# Patient Record
Sex: Male | Born: 1935 | Race: White | Hispanic: No | Smoking: Never smoker
Health system: Southern US, Community
[De-identification: ages and names within clinical notes are randomized; demographics above are authoritative.]

## PROBLEM LIST (undated history)

## (undated) DIAGNOSIS — D500 Iron deficiency anemia secondary to blood loss (chronic): Secondary | ICD-10-CM

## (undated) DIAGNOSIS — E785 Hyperlipidemia, unspecified: Secondary | ICD-10-CM

## (undated) DIAGNOSIS — W19XXXA Unspecified fall, initial encounter: Secondary | ICD-10-CM

## (undated) DIAGNOSIS — I714 Abdominal aortic aneurysm, without rupture, unspecified: Secondary | ICD-10-CM

## (undated) DIAGNOSIS — K219 Gastro-esophageal reflux disease without esophagitis: Secondary | ICD-10-CM

## (undated) DIAGNOSIS — K2960 Other gastritis without bleeding: Secondary | ICD-10-CM

## (undated) DIAGNOSIS — I2510 Atherosclerotic heart disease of native coronary artery without angina pectoris: Secondary | ICD-10-CM

## (undated) DIAGNOSIS — G4700 Insomnia, unspecified: Secondary | ICD-10-CM

## (undated) DIAGNOSIS — K5790 Diverticulosis of intestine, part unspecified, without perforation or abscess without bleeding: Secondary | ICD-10-CM

## (undated) DIAGNOSIS — E119 Type 2 diabetes mellitus without complications: Secondary | ICD-10-CM

## (undated) DIAGNOSIS — I4891 Unspecified atrial fibrillation: Secondary | ICD-10-CM

## (undated) DIAGNOSIS — N400 Enlarged prostate without lower urinary tract symptoms: Secondary | ICD-10-CM

## (undated) DIAGNOSIS — M8580 Other specified disorders of bone density and structure, unspecified site: Secondary | ICD-10-CM

## (undated) DIAGNOSIS — E538 Deficiency of other specified B group vitamins: Secondary | ICD-10-CM

## (undated) DIAGNOSIS — I10 Essential (primary) hypertension: Secondary | ICD-10-CM

## (undated) DIAGNOSIS — E039 Hypothyroidism, unspecified: Secondary | ICD-10-CM

## (undated) HISTORY — DX: Hyperlipidemia, unspecified: E78.5

## (undated) HISTORY — DX: Insomnia, unspecified: G47.00

## (undated) HISTORY — DX: Other specified disorders of bone density and structure, unspecified site: M85.80

## (undated) HISTORY — DX: Benign prostatic hyperplasia without lower urinary tract symptoms: N40.0

## (undated) HISTORY — DX: Other gastritis without bleeding: K29.60

## (undated) HISTORY — PX: CATARACT EXTRACTION: SUR2

## (undated) HISTORY — DX: Abdominal aortic aneurysm, without rupture: I71.4

## (undated) HISTORY — PX: CORONARY ARTERY BYPASS GRAFT: SHX141

## (undated) HISTORY — PX: PROSTATECTOMY: SHX69

## (undated) HISTORY — PX: COLONOSCOPY: SHX174

## (undated) HISTORY — DX: Type 2 diabetes mellitus without complications: E11.9

## (undated) HISTORY — DX: Diverticulosis of intestine, part unspecified, without perforation or abscess without bleeding: K57.90

## (undated) HISTORY — PX: ESOPHAGOGASTRODUODENOSCOPY: SHX1529

## (undated) HISTORY — DX: Deficiency of other specified B group vitamins: E53.8

## (undated) HISTORY — DX: Abdominal aortic aneurysm, without rupture, unspecified: I71.40

## (undated) HISTORY — DX: Iron deficiency anemia secondary to blood loss (chronic): D50.0

## (undated) HISTORY — DX: Gastro-esophageal reflux disease without esophagitis: K21.9

## (undated) HISTORY — PX: CARDIAC CATHETERIZATION: SHX172

## (undated) HISTORY — PX: CHOLECYSTECTOMY, LAPAROSCOPIC: SHX56

## (undated) HISTORY — DX: Essential (primary) hypertension: I10

## (undated) HISTORY — DX: Unspecified atrial fibrillation: I48.91

## (undated) HISTORY — DX: Hypothyroidism, unspecified: E03.9

## (undated) HISTORY — PX: OTHER SURGICAL HISTORY: SHX169

## (undated) HISTORY — DX: Atherosclerotic heart disease of native coronary artery without angina pectoris: I25.10

## (undated) SURGERY — Surgical Case
Anesthesia: *Unknown

---

## 1990-05-13 HISTORY — PX: PENILE PROSTHESIS IMPLANT: SHX240

## 1998-09-26 ENCOUNTER — Ambulatory Visit (HOSPITAL_COMMUNITY): Admission: RE | Admit: 1998-09-26 | Discharge: 1998-09-26 | Payer: Self-pay | Admitting: *Deleted

## 1998-09-26 ENCOUNTER — Encounter: Payer: Self-pay | Admitting: *Deleted

## 1998-11-23 ENCOUNTER — Observation Stay (HOSPITAL_COMMUNITY): Admission: AD | Admit: 1998-11-23 | Discharge: 1998-11-24 | Payer: Self-pay | Admitting: Cardiology

## 1998-11-23 ENCOUNTER — Encounter: Payer: Self-pay | Admitting: Cardiology

## 1998-11-24 ENCOUNTER — Encounter: Payer: Self-pay | Admitting: Cardiology

## 1998-12-08 ENCOUNTER — Observation Stay (HOSPITAL_COMMUNITY): Admission: AD | Admit: 1998-12-08 | Discharge: 1998-12-09 | Payer: Self-pay | Admitting: Cardiology

## 1999-05-18 ENCOUNTER — Inpatient Hospital Stay (HOSPITAL_COMMUNITY): Admission: EM | Admit: 1999-05-18 | Discharge: 1999-05-22 | Payer: Self-pay | Admitting: Podiatry

## 1999-05-18 ENCOUNTER — Encounter: Payer: Self-pay | Admitting: Cardiology

## 1999-06-01 ENCOUNTER — Encounter: Payer: Self-pay | Admitting: Cardiology

## 1999-06-01 ENCOUNTER — Encounter: Admission: RE | Admit: 1999-06-01 | Discharge: 1999-06-01 | Payer: Self-pay | Admitting: Cardiology

## 2000-04-30 ENCOUNTER — Encounter: Payer: Self-pay | Admitting: Gastroenterology

## 2000-04-30 ENCOUNTER — Ambulatory Visit (HOSPITAL_COMMUNITY): Admission: RE | Admit: 2000-04-30 | Discharge: 2000-04-30 | Payer: Self-pay | Admitting: Gastroenterology

## 2000-04-30 ENCOUNTER — Encounter (INDEPENDENT_AMBULATORY_CARE_PROVIDER_SITE_OTHER): Payer: Self-pay | Admitting: Specialist

## 2002-12-31 ENCOUNTER — Ambulatory Visit (HOSPITAL_COMMUNITY): Admission: RE | Admit: 2002-12-31 | Discharge: 2002-12-31 | Payer: Self-pay | Admitting: Orthopedic Surgery

## 2002-12-31 ENCOUNTER — Encounter: Payer: Self-pay | Admitting: Orthopedic Surgery

## 2003-01-07 ENCOUNTER — Inpatient Hospital Stay (HOSPITAL_COMMUNITY): Admission: AD | Admit: 2003-01-07 | Discharge: 2003-01-11 | Payer: Self-pay | Admitting: Family Medicine

## 2003-01-08 ENCOUNTER — Encounter: Payer: Self-pay | Admitting: Gastroenterology

## 2003-01-08 ENCOUNTER — Encounter: Payer: Self-pay | Admitting: Family Medicine

## 2003-01-09 ENCOUNTER — Encounter (INDEPENDENT_AMBULATORY_CARE_PROVIDER_SITE_OTHER): Payer: Self-pay | Admitting: *Deleted

## 2003-01-13 ENCOUNTER — Emergency Department (HOSPITAL_COMMUNITY): Admission: EM | Admit: 2003-01-13 | Discharge: 2003-01-13 | Payer: Self-pay | Admitting: *Deleted

## 2003-01-13 ENCOUNTER — Encounter: Payer: Self-pay | Admitting: Emergency Medicine

## 2003-02-04 DIAGNOSIS — Z96649 Presence of unspecified artificial hip joint: Secondary | ICD-10-CM | POA: Insufficient documentation

## 2003-02-09 ENCOUNTER — Inpatient Hospital Stay (HOSPITAL_COMMUNITY): Admission: RE | Admit: 2003-02-09 | Discharge: 2003-02-14 | Payer: Self-pay | Admitting: Orthopedic Surgery

## 2003-02-09 ENCOUNTER — Encounter: Payer: Self-pay | Admitting: Orthopedic Surgery

## 2003-02-10 ENCOUNTER — Encounter: Payer: Self-pay | Admitting: Orthopedic Surgery

## 2003-02-14 ENCOUNTER — Inpatient Hospital Stay (HOSPITAL_COMMUNITY)
Admission: RE | Admit: 2003-02-14 | Discharge: 2003-02-18 | Payer: Self-pay | Admitting: Physical Medicine & Rehabilitation

## 2004-09-13 ENCOUNTER — Ambulatory Visit: Payer: Self-pay | Admitting: Cardiology

## 2004-10-23 ENCOUNTER — Ambulatory Visit: Payer: Self-pay | Admitting: Cardiology

## 2004-11-12 ENCOUNTER — Ambulatory Visit: Payer: Self-pay | Admitting: Internal Medicine

## 2004-11-15 ENCOUNTER — Ambulatory Visit: Payer: Self-pay | Admitting: Internal Medicine

## 2005-02-25 ENCOUNTER — Ambulatory Visit: Payer: Self-pay

## 2005-03-08 ENCOUNTER — Ambulatory Visit: Payer: Self-pay

## 2005-04-03 ENCOUNTER — Ambulatory Visit: Payer: Self-pay | Admitting: Internal Medicine

## 2005-09-10 ENCOUNTER — Ambulatory Visit: Payer: Self-pay | Admitting: Cardiology

## 2005-09-11 ENCOUNTER — Ambulatory Visit: Payer: Self-pay | Admitting: Internal Medicine

## 2006-01-29 ENCOUNTER — Ambulatory Visit: Payer: Self-pay | Admitting: Cardiology

## 2006-01-31 ENCOUNTER — Ambulatory Visit: Payer: Self-pay | Admitting: Cardiology

## 2006-01-31 ENCOUNTER — Ambulatory Visit (HOSPITAL_COMMUNITY): Admission: RE | Admit: 2006-01-31 | Discharge: 2006-01-31 | Payer: Self-pay | Admitting: Cardiology

## 2006-02-06 ENCOUNTER — Ambulatory Visit: Payer: Self-pay | Admitting: Cardiology

## 2006-03-12 ENCOUNTER — Ambulatory Visit: Payer: Self-pay | Admitting: Internal Medicine

## 2006-03-14 ENCOUNTER — Ambulatory Visit: Payer: Self-pay | Admitting: Internal Medicine

## 2006-03-26 ENCOUNTER — Ambulatory Visit: Payer: Self-pay | Admitting: Internal Medicine

## 2006-03-26 LAB — CONVERTED CEMR LAB
ALT: 24 units/L (ref 0–40)
Albumin: 3.9 g/dL (ref 3.5–5.2)
Alkaline Phosphatase: 43 units/L (ref 39–117)
Folate: >20 ng/mL
Triglyceride fasting, serum: 113 mg/dL (ref 0–149)
VLDL: 23 mg/dL (ref 0–40)
Vitamin B-12: 175 pg/mL — ABNORMAL LOW (ref 211–911)

## 2006-03-31 ENCOUNTER — Ambulatory Visit: Payer: Self-pay | Admitting: Cardiology

## 2006-03-31 ENCOUNTER — Ambulatory Visit: Payer: Self-pay

## 2006-04-07 ENCOUNTER — Ambulatory Visit: Payer: Self-pay | Admitting: Cardiology

## 2006-05-07 ENCOUNTER — Ambulatory Visit: Payer: Self-pay | Admitting: Internal Medicine

## 2006-05-07 LAB — CONVERTED CEMR LAB
BUN: 16 mg/dL (ref 6–23)
CO2: 28 meq/L (ref 19–32)
Calcium: 8.4 mg/dL (ref 8.4–10.5)
Chloride: 107 meq/L (ref 96–112)
Creatinine, Ser: 1.1 mg/dL (ref 0.4–1.5)
Glomerular Filtration Rate, Af Am: 83 mL/min/{1.73_m2}
Glucose, Bld: 191 mg/dL — ABNORMAL HIGH (ref 70–99)

## 2006-05-14 ENCOUNTER — Ambulatory Visit: Payer: Self-pay | Admitting: Internal Medicine

## 2006-05-21 ENCOUNTER — Ambulatory Visit: Payer: Self-pay | Admitting: Internal Medicine

## 2006-05-28 ENCOUNTER — Ambulatory Visit: Payer: Self-pay | Admitting: Internal Medicine

## 2006-05-28 LAB — CONVERTED CEMR LAB
Calcium: 8.5 mg/dL (ref 8.4–10.5)
Chloride: 98 meq/L (ref 96–112)
Creatinine, Ser: 1.4 mg/dL (ref 0.4–1.5)
GFR calc non Af Amer: 52 mL/min
Glucose, Bld: 115 mg/dL — ABNORMAL HIGH (ref 70–99)
Magnesium: 2.6 mg/dL — ABNORMAL HIGH (ref 1.5–2.5)
Sodium: 132 meq/L — ABNORMAL LOW (ref 135–145)

## 2006-06-30 ENCOUNTER — Ambulatory Visit: Payer: Self-pay | Admitting: Internal Medicine

## 2006-10-02 DIAGNOSIS — I2510 Atherosclerotic heart disease of native coronary artery without angina pectoris: Secondary | ICD-10-CM

## 2006-10-30 ENCOUNTER — Ambulatory Visit: Payer: Self-pay | Admitting: Internal Medicine

## 2006-10-30 DIAGNOSIS — E785 Hyperlipidemia, unspecified: Secondary | ICD-10-CM

## 2006-10-31 ENCOUNTER — Ambulatory Visit: Payer: Self-pay

## 2006-10-31 ENCOUNTER — Encounter: Payer: Self-pay | Admitting: Internal Medicine

## 2006-12-12 HISTORY — PX: TRANSTHORACIC ECHOCARDIOGRAM: SHX275

## 2007-01-08 ENCOUNTER — Ambulatory Visit: Payer: Self-pay | Admitting: Cardiovascular Disease

## 2007-01-08 ENCOUNTER — Ambulatory Visit: Payer: Self-pay | Admitting: Internal Medicine

## 2007-01-08 ENCOUNTER — Inpatient Hospital Stay (HOSPITAL_COMMUNITY): Admission: EM | Admit: 2007-01-08 | Discharge: 2007-01-10 | Payer: Self-pay | Admitting: Emergency Medicine

## 2007-01-09 ENCOUNTER — Encounter: Payer: Self-pay | Admitting: Internal Medicine

## 2007-01-13 ENCOUNTER — Ambulatory Visit: Payer: Self-pay | Admitting: Cardiology

## 2007-01-20 ENCOUNTER — Ambulatory Visit: Payer: Self-pay | Admitting: Cardiology

## 2007-01-26 ENCOUNTER — Ambulatory Visit: Payer: Self-pay | Admitting: Internal Medicine

## 2007-01-26 DIAGNOSIS — F5102 Adjustment insomnia: Secondary | ICD-10-CM

## 2007-01-26 DIAGNOSIS — I4821 Permanent atrial fibrillation: Secondary | ICD-10-CM

## 2007-01-26 DIAGNOSIS — E118 Type 2 diabetes mellitus with unspecified complications: Secondary | ICD-10-CM

## 2007-01-26 DIAGNOSIS — E039 Hypothyroidism, unspecified: Secondary | ICD-10-CM

## 2007-01-29 ENCOUNTER — Ambulatory Visit: Payer: Self-pay | Admitting: Cardiology

## 2007-01-29 LAB — CONVERTED CEMR LAB: Prothrombin Time: 30.4 s (ref 10.9–13.3)

## 2007-02-05 ENCOUNTER — Ambulatory Visit: Payer: Self-pay | Admitting: Cardiology

## 2007-02-16 ENCOUNTER — Ambulatory Visit: Payer: Self-pay | Admitting: Cardiology

## 2007-02-20 ENCOUNTER — Encounter: Payer: Self-pay | Admitting: Internal Medicine

## 2007-02-20 ENCOUNTER — Telehealth (INDEPENDENT_AMBULATORY_CARE_PROVIDER_SITE_OTHER): Payer: Self-pay | Admitting: *Deleted

## 2007-03-13 ENCOUNTER — Ambulatory Visit: Payer: Self-pay | Admitting: Cardiology

## 2007-03-26 ENCOUNTER — Ambulatory Visit: Payer: Self-pay

## 2007-03-31 ENCOUNTER — Ambulatory Visit: Payer: Self-pay | Admitting: Internal Medicine

## 2007-03-31 DIAGNOSIS — K219 Gastro-esophageal reflux disease without esophagitis: Secondary | ICD-10-CM

## 2007-04-07 ENCOUNTER — Encounter: Payer: Self-pay | Admitting: Internal Medicine

## 2007-04-07 ENCOUNTER — Ambulatory Visit: Payer: Self-pay | Admitting: Internal Medicine

## 2007-04-13 LAB — CONVERTED CEMR LAB
Creatinine,U: 119.5 mg/dL
Hgb A1c MFr Bld: 6.9 % — ABNORMAL HIGH (ref 4.6–6.0)
Microalb, Ur: 1 mg/dL (ref 0.0–1.9)
PSA: 3.12 ng/mL (ref 0.10–4.00)

## 2007-04-28 ENCOUNTER — Encounter (INDEPENDENT_AMBULATORY_CARE_PROVIDER_SITE_OTHER): Payer: Self-pay | Admitting: *Deleted

## 2007-05-19 ENCOUNTER — Ambulatory Visit: Payer: Self-pay | Admitting: Cardiology

## 2007-05-19 LAB — CONVERTED CEMR LAB
BUN: 14 mg/dL (ref 6–23)
Basophils Absolute: 0 10*3/uL (ref 0.0–0.1)
Eosinophils Absolute: 0.1 10*3/uL (ref 0.0–0.6)
GFR calc Af Amer: 83 mL/min
GFR calc non Af Amer: 69 mL/min
HCT: 40 % (ref 39.0–52.0)
Hemoglobin: 13.4 g/dL (ref 13.0–17.0)
MCHC: 33.4 g/dL (ref 30.0–36.0)
MCV: 92.6 fL (ref 78.0–100.0)
Monocytes Absolute: 0.6 10*3/uL (ref 0.2–0.7)
Monocytes Relative: 9.4 % (ref 3.0–11.0)
Neutrophils Relative %: 71.5 % (ref 43.0–77.0)
Potassium: 4.8 meq/L (ref 3.5–5.1)
Sodium: 136 meq/L (ref 135–145)

## 2007-05-22 ENCOUNTER — Telehealth (INDEPENDENT_AMBULATORY_CARE_PROVIDER_SITE_OTHER): Payer: Self-pay | Admitting: *Deleted

## 2007-05-22 ENCOUNTER — Ambulatory Visit: Payer: Self-pay | Admitting: Internal Medicine

## 2007-05-22 ENCOUNTER — Ambulatory Visit: Payer: Self-pay | Admitting: Cardiology

## 2007-05-22 LAB — CONVERTED CEMR LAB
INR: 4.3
Prothrombin Time: 25.3 s

## 2007-05-26 ENCOUNTER — Ambulatory Visit: Payer: Self-pay | Admitting: Internal Medicine

## 2007-05-28 ENCOUNTER — Ambulatory Visit: Payer: Self-pay

## 2007-05-28 ENCOUNTER — Encounter: Payer: Self-pay | Admitting: Internal Medicine

## 2007-05-29 ENCOUNTER — Telehealth: Payer: Self-pay | Admitting: Internal Medicine

## 2007-05-29 ENCOUNTER — Telehealth (INDEPENDENT_AMBULATORY_CARE_PROVIDER_SITE_OTHER): Payer: Self-pay | Admitting: *Deleted

## 2007-06-02 ENCOUNTER — Ambulatory Visit: Payer: Self-pay | Admitting: Cardiology

## 2007-06-02 ENCOUNTER — Ambulatory Visit: Payer: Self-pay | Admitting: Cardiovascular Disease

## 2007-06-05 ENCOUNTER — Ambulatory Visit: Payer: Self-pay | Admitting: Cardiology

## 2007-06-05 ENCOUNTER — Ambulatory Visit (HOSPITAL_COMMUNITY): Admission: RE | Admit: 2007-06-05 | Discharge: 2007-06-05 | Payer: Self-pay | Admitting: Cardiology

## 2007-06-08 ENCOUNTER — Ambulatory Visit: Payer: Self-pay | Admitting: Internal Medicine

## 2007-06-10 ENCOUNTER — Ambulatory Visit: Payer: Self-pay | Admitting: Cardiology

## 2007-06-11 ENCOUNTER — Encounter (HOSPITAL_COMMUNITY): Admission: RE | Admit: 2007-06-11 | Discharge: 2007-06-26 | Payer: Self-pay | Admitting: Cardiology

## 2007-06-17 ENCOUNTER — Telehealth (INDEPENDENT_AMBULATORY_CARE_PROVIDER_SITE_OTHER): Payer: Self-pay | Admitting: *Deleted

## 2007-06-18 ENCOUNTER — Ambulatory Visit: Payer: Self-pay | Admitting: Cardiology

## 2007-06-22 ENCOUNTER — Ambulatory Visit: Payer: Self-pay | Admitting: Internal Medicine

## 2007-06-24 ENCOUNTER — Telehealth (INDEPENDENT_AMBULATORY_CARE_PROVIDER_SITE_OTHER): Payer: Self-pay | Admitting: *Deleted

## 2007-06-29 ENCOUNTER — Encounter: Admission: RE | Admit: 2007-06-29 | Discharge: 2007-06-29 | Payer: Self-pay | Admitting: Neurology

## 2007-07-07 ENCOUNTER — Telehealth: Payer: Self-pay | Admitting: Internal Medicine

## 2007-07-14 ENCOUNTER — Ambulatory Visit: Payer: Self-pay | Admitting: Internal Medicine

## 2007-07-14 ENCOUNTER — Ambulatory Visit: Payer: Self-pay | Admitting: Cardiology

## 2007-07-28 ENCOUNTER — Ambulatory Visit: Payer: Self-pay | Admitting: Internal Medicine

## 2007-08-04 ENCOUNTER — Ambulatory Visit: Payer: Self-pay | Admitting: Internal Medicine

## 2007-08-11 ENCOUNTER — Ambulatory Visit: Payer: Self-pay | Admitting: Internal Medicine

## 2007-09-07 ENCOUNTER — Ambulatory Visit: Payer: Self-pay | Admitting: Internal Medicine

## 2007-09-08 ENCOUNTER — Ambulatory Visit: Payer: Self-pay | Admitting: Internal Medicine

## 2007-09-08 LAB — CONVERTED CEMR LAB
Cholesterol: 162 mg/dL (ref 0–200)
Direct LDL: 98.6 mg/dL
HDL: 32.9 mg/dL — ABNORMAL LOW (ref >39.0)
TSH: 3.23 microintl units/mL (ref 0.35–5.50)
Triglycerides: 240 mg/dL (ref 0–149)
VLDL: 48 mg/dL — ABNORMAL HIGH (ref 0–40)

## 2007-09-09 ENCOUNTER — Telehealth (INDEPENDENT_AMBULATORY_CARE_PROVIDER_SITE_OTHER): Payer: Self-pay | Admitting: *Deleted

## 2007-09-09 ENCOUNTER — Encounter: Payer: Self-pay | Admitting: Internal Medicine

## 2007-09-10 ENCOUNTER — Encounter (INDEPENDENT_AMBULATORY_CARE_PROVIDER_SITE_OTHER): Payer: Self-pay | Admitting: *Deleted

## 2007-09-10 ENCOUNTER — Ambulatory Visit: Payer: Self-pay | Admitting: Internal Medicine

## 2007-09-15 ENCOUNTER — Ambulatory Visit: Payer: Self-pay | Admitting: Cardiology

## 2007-09-16 ENCOUNTER — Telehealth (INDEPENDENT_AMBULATORY_CARE_PROVIDER_SITE_OTHER): Payer: Self-pay | Admitting: *Deleted

## 2007-09-16 LAB — CONVERTED CEMR LAB: Hgb A1c MFr Bld: 7.6 % — ABNORMAL HIGH (ref 4.6–6.0)

## 2007-09-28 ENCOUNTER — Ambulatory Visit: Payer: Self-pay | Admitting: Internal Medicine

## 2007-09-28 ENCOUNTER — Encounter: Payer: Self-pay | Admitting: Internal Medicine

## 2007-10-06 ENCOUNTER — Ambulatory Visit: Payer: Self-pay | Admitting: Cardiology

## 2007-10-06 ENCOUNTER — Encounter (INDEPENDENT_AMBULATORY_CARE_PROVIDER_SITE_OTHER): Payer: Self-pay | Admitting: *Deleted

## 2007-10-07 ENCOUNTER — Ambulatory Visit: Payer: Self-pay | Admitting: Internal Medicine

## 2007-10-07 ENCOUNTER — Telehealth: Payer: Self-pay | Admitting: Internal Medicine

## 2007-10-29 ENCOUNTER — Ambulatory Visit: Payer: Self-pay | Admitting: Cardiology

## 2007-11-06 ENCOUNTER — Ambulatory Visit: Payer: Self-pay | Admitting: Internal Medicine

## 2007-11-06 LAB — CONVERTED CEMR LAB
INR: 2
Prothrombin Time: 17.5 s

## 2007-11-17 ENCOUNTER — Ambulatory Visit: Payer: Self-pay | Admitting: Internal Medicine

## 2007-11-17 LAB — CONVERTED CEMR LAB
INR: 1.7
Prothrombin Time: 15.8 s

## 2007-12-01 ENCOUNTER — Ambulatory Visit: Payer: Self-pay | Admitting: Internal Medicine

## 2007-12-15 ENCOUNTER — Ambulatory Visit: Payer: Self-pay | Admitting: Internal Medicine

## 2007-12-16 ENCOUNTER — Telehealth: Payer: Self-pay | Admitting: Gastroenterology

## 2007-12-18 ENCOUNTER — Telehealth (INDEPENDENT_AMBULATORY_CARE_PROVIDER_SITE_OTHER): Payer: Self-pay | Admitting: *Deleted

## 2007-12-18 LAB — CONVERTED CEMR LAB
Albumin: 4 g/dL (ref 3.5–5.2)
Alkaline Phosphatase: 39 units/L (ref 39–117)
BUN: 18 mg/dL (ref 6–23)
Creatinine, Ser: 1.2 mg/dL (ref 0.4–1.5)
Creatinine,U: 215.2 mg/dL
GFR calc Af Amer: 75 mL/min
Glucose, Bld: 140 mg/dL — ABNORMAL HIGH (ref 70–99)
Microalb Creat Ratio: 3.7 mg/g (ref 0.0–30.0)
Potassium: 5 meq/L (ref 3.5–5.1)
Total Protein: 7 g/dL (ref 6.0–8.3)

## 2007-12-23 ENCOUNTER — Ambulatory Visit: Payer: Self-pay | Admitting: Internal Medicine

## 2007-12-23 LAB — CONVERTED CEMR LAB: Prothrombin Time: 15.6 s

## 2008-01-08 ENCOUNTER — Ambulatory Visit: Payer: Self-pay | Admitting: Internal Medicine

## 2008-01-08 LAB — CONVERTED CEMR LAB: INR: 19.7

## 2008-01-25 ENCOUNTER — Ambulatory Visit: Payer: Self-pay | Admitting: Internal Medicine

## 2008-01-25 LAB — CONVERTED CEMR LAB
INR: 3.7
Prothrombin Time: 23.4 s

## 2008-02-03 ENCOUNTER — Ambulatory Visit: Payer: Self-pay | Admitting: Internal Medicine

## 2008-02-04 ENCOUNTER — Ambulatory Visit: Payer: Self-pay | Admitting: Internal Medicine

## 2008-02-18 ENCOUNTER — Ambulatory Visit: Payer: Self-pay | Admitting: Internal Medicine

## 2008-03-01 ENCOUNTER — Ambulatory Visit: Payer: Self-pay | Admitting: Internal Medicine

## 2008-03-07 ENCOUNTER — Ambulatory Visit: Payer: Self-pay | Admitting: Cardiology

## 2008-03-15 ENCOUNTER — Ambulatory Visit: Payer: Self-pay | Admitting: Internal Medicine

## 2008-03-15 LAB — CONVERTED CEMR LAB
INR: 6.3
Prothrombin Time: 30.3 s

## 2008-03-16 ENCOUNTER — Ambulatory Visit: Payer: Self-pay | Admitting: Internal Medicine

## 2008-03-21 ENCOUNTER — Telehealth (INDEPENDENT_AMBULATORY_CARE_PROVIDER_SITE_OTHER): Payer: Self-pay | Admitting: *Deleted

## 2008-03-21 LAB — CONVERTED CEMR LAB
ALT: 23 units/L (ref 0–53)
AST: 28 units/L (ref 0–37)
HDL: 26.7 mg/dL — ABNORMAL LOW (ref >39.0)
TSH: 5.82 microintl units/mL — ABNORMAL HIGH (ref 0.35–5.50)
Triglycerides: 189 mg/dL — ABNORMAL HIGH (ref 0–149)

## 2008-03-31 ENCOUNTER — Ambulatory Visit: Payer: Self-pay

## 2008-04-05 ENCOUNTER — Ambulatory Visit: Payer: Self-pay | Admitting: Internal Medicine

## 2008-04-05 LAB — CONVERTED CEMR LAB
INR: 3.1
Prothrombin Time: 21.2 s

## 2008-04-19 ENCOUNTER — Ambulatory Visit: Payer: Self-pay | Admitting: Family Medicine

## 2008-05-24 ENCOUNTER — Ambulatory Visit: Payer: Self-pay | Admitting: Internal Medicine

## 2008-06-01 DIAGNOSIS — Z9861 Coronary angioplasty status: Secondary | ICD-10-CM

## 2008-06-01 DIAGNOSIS — R5381 Other malaise: Secondary | ICD-10-CM

## 2008-06-01 DIAGNOSIS — I10 Essential (primary) hypertension: Secondary | ICD-10-CM

## 2008-06-01 DIAGNOSIS — R5383 Other fatigue: Secondary | ICD-10-CM

## 2008-06-01 DIAGNOSIS — Z950 Presence of cardiac pacemaker: Secondary | ICD-10-CM

## 2008-06-01 DIAGNOSIS — Z951 Presence of aortocoronary bypass graft: Secondary | ICD-10-CM

## 2008-06-01 DIAGNOSIS — E538 Deficiency of other specified B group vitamins: Secondary | ICD-10-CM | POA: Insufficient documentation

## 2008-06-02 ENCOUNTER — Ambulatory Visit: Payer: Self-pay | Admitting: Internal Medicine

## 2008-06-14 ENCOUNTER — Ambulatory Visit: Payer: Self-pay | Admitting: Internal Medicine

## 2008-06-14 LAB — CONVERTED CEMR LAB
INR: 3.4
Prothrombin Time: 22.2 s

## 2008-06-21 ENCOUNTER — Telehealth (INDEPENDENT_AMBULATORY_CARE_PROVIDER_SITE_OTHER): Payer: Self-pay | Admitting: *Deleted

## 2008-06-21 LAB — CONVERTED CEMR LAB
BUN: 18 mg/dL (ref 6–23)
Calcium: 8.9 mg/dL (ref 8.4–10.5)
Eosinophils Absolute: 0.2 10*3/uL (ref 0.0–0.7)
Eosinophils Relative: 4.2 % (ref 0.0–5.0)
GFR calc Af Amer: 75 mL/min
GFR calc non Af Amer: 62 mL/min
HCT: 36.6 % — ABNORMAL LOW (ref 39.0–52.0)
HDL: 26.1 mg/dL — ABNORMAL LOW (ref >39.0)
MCV: 93.9 fL (ref 78.0–100.0)
Monocytes Absolute: 0.5 10*3/uL (ref 0.1–1.0)
Platelets: 184 10*3/uL (ref 150–400)
Potassium: 5.2 meq/L — ABNORMAL HIGH (ref 3.5–5.1)
RDW: 14.9 % — ABNORMAL HIGH (ref 11.5–14.6)
TSH: 4.11 microintl units/mL (ref 0.35–5.50)
Triglycerides: 160 mg/dL — ABNORMAL HIGH (ref 0–149)
WBC: 4.2 10*3/uL — ABNORMAL LOW (ref 4.5–10.5)

## 2008-06-28 ENCOUNTER — Encounter (INDEPENDENT_AMBULATORY_CARE_PROVIDER_SITE_OTHER): Payer: Self-pay | Admitting: *Deleted

## 2008-06-28 ENCOUNTER — Ambulatory Visit: Payer: Self-pay | Admitting: Internal Medicine

## 2008-06-30 ENCOUNTER — Telehealth (INDEPENDENT_AMBULATORY_CARE_PROVIDER_SITE_OTHER): Payer: Self-pay | Admitting: *Deleted

## 2008-06-30 LAB — CONVERTED CEMR LAB: Ferritin: 21.7 ng/mL — ABNORMAL LOW (ref 22.0–322.0)

## 2008-07-26 ENCOUNTER — Ambulatory Visit: Payer: Self-pay | Admitting: Internal Medicine

## 2008-07-26 LAB — CONVERTED CEMR LAB
INR: 2.7
Prothrombin Time: 20 s

## 2008-08-17 ENCOUNTER — Ambulatory Visit: Payer: Self-pay | Admitting: Internal Medicine

## 2008-08-23 ENCOUNTER — Ambulatory Visit: Payer: Self-pay | Admitting: Internal Medicine

## 2008-08-26 ENCOUNTER — Ambulatory Visit: Payer: Self-pay | Admitting: Internal Medicine

## 2008-08-26 LAB — CONVERTED CEMR LAB
Fecal Occult Bld: NEGATIVE
INR: 1.5

## 2008-09-01 ENCOUNTER — Encounter (INDEPENDENT_AMBULATORY_CARE_PROVIDER_SITE_OTHER): Payer: Self-pay | Admitting: *Deleted

## 2008-09-05 ENCOUNTER — Ambulatory Visit: Payer: Self-pay | Admitting: Internal Medicine

## 2008-09-08 ENCOUNTER — Encounter (INDEPENDENT_AMBULATORY_CARE_PROVIDER_SITE_OTHER): Payer: Self-pay | Admitting: *Deleted

## 2008-09-13 ENCOUNTER — Ambulatory Visit: Payer: Self-pay | Admitting: Internal Medicine

## 2008-09-29 ENCOUNTER — Telehealth (INDEPENDENT_AMBULATORY_CARE_PROVIDER_SITE_OTHER): Payer: Self-pay | Admitting: *Deleted

## 2008-10-12 ENCOUNTER — Ambulatory Visit: Payer: Self-pay | Admitting: Internal Medicine

## 2008-10-12 LAB — CONVERTED CEMR LAB
INR: 1.3
Prothrombin Time: 14 s

## 2008-10-18 LAB — CONVERTED CEMR LAB
ALT: 19 units/L (ref 0–53)
AST: 19 units/L (ref 0–37)
Calcium: 8.5 mg/dL (ref 8.4–10.5)
GFR calc non Af Amer: 51.75 mL/min (ref >60)
Glucose, Bld: 101 mg/dL — ABNORMAL HIGH (ref 70–99)
Hgb A1c MFr Bld: 7.1 % — ABNORMAL HIGH (ref 4.6–6.5)
Potassium: 5.5 meq/L — ABNORMAL HIGH (ref 3.5–5.1)
Sodium: 139 meq/L (ref 135–145)

## 2008-10-26 ENCOUNTER — Ambulatory Visit: Payer: Self-pay | Admitting: Internal Medicine

## 2008-11-03 ENCOUNTER — Telehealth: Payer: Self-pay | Admitting: Internal Medicine

## 2008-11-07 ENCOUNTER — Ambulatory Visit: Payer: Self-pay | Admitting: Internal Medicine

## 2008-11-11 LAB — CONVERTED CEMR LAB
Basophils Relative: 0.6 % (ref 0.0–3.0)
Eosinophils Absolute: 0.2 10*3/uL (ref 0.0–0.7)
Eosinophils Relative: 3.4 % (ref 0.0–5.0)
HCT: 33.3 % — ABNORMAL LOW (ref 39.0–52.0)
Lymphs Abs: 0.9 10*3/uL (ref 0.7–4.0)
MCHC: 33.4 g/dL (ref 30.0–36.0)
MCV: 87.6 fL (ref 78.0–100.0)
Monocytes Absolute: 0.7 10*3/uL (ref 0.1–1.0)
Neutrophils Relative %: 61.3 % (ref 43.0–77.0)
Platelets: 203 10*3/uL (ref 150.0–400.0)
WBC: 4.8 10*3/uL (ref 4.5–10.5)

## 2008-11-25 ENCOUNTER — Ambulatory Visit: Payer: Self-pay | Admitting: Internal Medicine

## 2008-11-25 DIAGNOSIS — D500 Iron deficiency anemia secondary to blood loss (chronic): Secondary | ICD-10-CM | POA: Insufficient documentation

## 2008-11-25 HISTORY — DX: Iron deficiency anemia secondary to blood loss (chronic): D50.0

## 2008-12-05 ENCOUNTER — Ambulatory Visit: Payer: Self-pay | Admitting: Internal Medicine

## 2008-12-05 LAB — CONVERTED CEMR LAB
Basophils Relative: 0.6 % (ref 0.0–3.0)
Calcium: 8.5 mg/dL (ref 8.4–10.5)
Eosinophils Relative: 1.6 % (ref 0.0–5.0)
Ferritin: 18 ng/mL — ABNORMAL LOW (ref 22–322)
GFR calc non Af Amer: 51.73 mL/min (ref >60)
HCT: 33.5 % — ABNORMAL LOW (ref 39.0–52.0)
Hemoglobin: 11.2 g/dL — ABNORMAL LOW (ref 13.0–17.0)
Iron: 36 ug/dL — ABNORMAL LOW (ref 42–165)
Lymphocytes Relative: 11.2 % — ABNORMAL LOW (ref 12.0–46.0)
Lymphs Abs: 0.7 10*3/uL (ref 0.7–4.0)
Monocytes Relative: 16.1 % — ABNORMAL HIGH (ref 3.0–12.0)
Neutro Abs: 4.2 10*3/uL (ref 1.4–7.7)
Potassium: 5 meq/L (ref 3.5–5.1)
Prothrombin Time: 22 s
RBC: 3.98 M/uL — ABNORMAL LOW (ref 4.22–5.81)
Sodium: 137 meq/L (ref 135–145)
Vitamin B-12: 199 pg/mL — ABNORMAL LOW (ref 211–911)

## 2008-12-07 ENCOUNTER — Encounter: Payer: Self-pay | Admitting: Internal Medicine

## 2008-12-08 ENCOUNTER — Telehealth: Payer: Self-pay | Admitting: Internal Medicine

## 2008-12-12 ENCOUNTER — Telehealth (INDEPENDENT_AMBULATORY_CARE_PROVIDER_SITE_OTHER): Payer: Self-pay

## 2008-12-13 ENCOUNTER — Ambulatory Visit: Payer: Self-pay | Admitting: Internal Medicine

## 2008-12-15 ENCOUNTER — Ambulatory Visit: Payer: Self-pay | Admitting: Internal Medicine

## 2008-12-20 ENCOUNTER — Ambulatory Visit: Payer: Self-pay | Admitting: Internal Medicine

## 2008-12-20 LAB — CONVERTED CEMR LAB
INR: 1
Prothrombin Time: 12.1 s

## 2008-12-27 ENCOUNTER — Ambulatory Visit: Payer: Self-pay | Admitting: Internal Medicine

## 2008-12-29 ENCOUNTER — Ambulatory Visit: Payer: Self-pay | Admitting: Internal Medicine

## 2009-01-09 ENCOUNTER — Ambulatory Visit: Payer: Self-pay | Admitting: Internal Medicine

## 2009-01-09 LAB — CONVERTED CEMR LAB
INR: 2
Prothrombin Time: 17.6 s

## 2009-01-20 ENCOUNTER — Ambulatory Visit: Payer: Self-pay | Admitting: Internal Medicine

## 2009-01-20 LAB — CONVERTED CEMR LAB: INR: 3.8

## 2009-02-03 ENCOUNTER — Ambulatory Visit: Payer: Self-pay | Admitting: Internal Medicine

## 2009-02-03 LAB — CONVERTED CEMR LAB
INR: 24.7
Prothrombin Time: 4.2 s

## 2009-02-14 ENCOUNTER — Ambulatory Visit: Payer: Self-pay | Admitting: Internal Medicine

## 2009-02-14 LAB — CONVERTED CEMR LAB: INR: 2.6

## 2009-02-24 LAB — CONVERTED CEMR LAB
Calcium: 9 mg/dL (ref 8.4–10.5)
Creatinine,U: 153.6 mg/dL
GFR calc non Af Amer: 51.7 mL/min (ref >60)
Glucose, Bld: 124 mg/dL — ABNORMAL HIGH (ref 70–99)
Hgb A1c MFr Bld: 6.7 % — ABNORMAL HIGH (ref 4.6–6.5)
Sodium: 138 meq/L (ref 135–145)

## 2009-02-28 ENCOUNTER — Ambulatory Visit: Payer: Self-pay | Admitting: Internal Medicine

## 2009-02-28 LAB — CONVERTED CEMR LAB: Prothrombin Time: 12.9 s

## 2009-03-02 ENCOUNTER — Encounter: Payer: Self-pay | Admitting: Internal Medicine

## 2009-03-02 LAB — CONVERTED CEMR LAB
Sodium, 24H Ur: 222 mmol/L — ABNORMAL HIGH (ref 40–220)
Sodium, Ur: 101 meq/L

## 2009-03-10 ENCOUNTER — Ambulatory Visit: Payer: Self-pay | Admitting: Internal Medicine

## 2009-03-10 LAB — CONVERTED CEMR LAB: INR: 17

## 2009-03-16 LAB — CONVERTED CEMR LAB
CO2: 24 meq/L (ref 19–32)
Chloride: 104 meq/L (ref 96–112)
Creatinine, Ser: 1.4 mg/dL (ref 0.4–1.5)
Sodium: 137 meq/L (ref 135–145)

## 2009-03-31 ENCOUNTER — Ambulatory Visit: Payer: Self-pay | Admitting: Internal Medicine

## 2009-03-31 LAB — CONVERTED CEMR LAB: INR: 2.2

## 2009-05-01 ENCOUNTER — Ambulatory Visit: Payer: Self-pay | Admitting: Internal Medicine

## 2009-05-16 ENCOUNTER — Ambulatory Visit: Payer: Self-pay | Admitting: Internal Medicine

## 2009-06-07 ENCOUNTER — Ambulatory Visit: Payer: Self-pay | Admitting: Internal Medicine

## 2009-06-07 DIAGNOSIS — M8580 Other specified disorders of bone density and structure, unspecified site: Secondary | ICD-10-CM | POA: Insufficient documentation

## 2009-06-07 DIAGNOSIS — N400 Enlarged prostate without lower urinary tract symptoms: Secondary | ICD-10-CM | POA: Insufficient documentation

## 2009-06-12 ENCOUNTER — Telehealth (INDEPENDENT_AMBULATORY_CARE_PROVIDER_SITE_OTHER): Payer: Self-pay | Admitting: *Deleted

## 2009-06-12 LAB — CONVERTED CEMR LAB
ALT: 17 units/L (ref 0–53)
Basophils Relative: 1 % (ref 0–1)
Eosinophils Absolute: 0.1 10*3/uL (ref 0.0–0.7)
Hemoglobin: 13.3 g/dL (ref 13.0–17.0)
Hgb A1c MFr Bld: 7.5 % — ABNORMAL HIGH (ref 4.6–6.1)
MCHC: 32.8 g/dL (ref 30.0–36.0)
MCV: 97.6 fL (ref 78.0–100.0)
Monocytes Absolute: 0.6 10*3/uL (ref 0.1–1.0)
Monocytes Relative: 13 % — ABNORMAL HIGH (ref 3–12)
PSA: 2.7 ng/mL (ref 0.10–4.00)
RBC: 4.16 M/uL — ABNORMAL LOW (ref 4.22–5.81)
Vitamin B-12: >2000 pg/mL — ABNORMAL HIGH (ref 211–911)

## 2009-06-15 ENCOUNTER — Encounter: Payer: Self-pay | Admitting: Internal Medicine

## 2009-06-15 ENCOUNTER — Ambulatory Visit: Payer: Self-pay | Admitting: Internal Medicine

## 2009-07-06 ENCOUNTER — Telehealth: Payer: Self-pay | Admitting: Internal Medicine

## 2009-07-13 ENCOUNTER — Ambulatory Visit: Payer: Self-pay | Admitting: Internal Medicine

## 2009-08-07 ENCOUNTER — Ambulatory Visit: Payer: Self-pay | Admitting: Cardiology

## 2009-08-07 ENCOUNTER — Encounter: Payer: Self-pay | Admitting: Internal Medicine

## 2009-09-27 ENCOUNTER — Telehealth (INDEPENDENT_AMBULATORY_CARE_PROVIDER_SITE_OTHER): Payer: Self-pay | Admitting: *Deleted

## 2009-09-29 ENCOUNTER — Ambulatory Visit: Payer: Self-pay | Admitting: Internal Medicine

## 2009-10-30 ENCOUNTER — Ambulatory Visit: Payer: Self-pay | Admitting: Internal Medicine

## 2009-10-30 LAB — CONVERTED CEMR LAB: INR: 2

## 2009-11-27 ENCOUNTER — Ambulatory Visit: Payer: Self-pay | Admitting: Internal Medicine

## 2009-12-12 ENCOUNTER — Ambulatory Visit: Payer: Self-pay | Admitting: Internal Medicine

## 2010-04-09 ENCOUNTER — Telehealth: Payer: Self-pay | Admitting: Internal Medicine

## 2010-04-10 ENCOUNTER — Encounter: Payer: Self-pay | Admitting: Cardiology

## 2010-04-11 ENCOUNTER — Ambulatory Visit: Payer: Self-pay

## 2010-04-11 ENCOUNTER — Encounter: Payer: Self-pay | Admitting: Cardiology

## 2010-04-13 ENCOUNTER — Ambulatory Visit: Payer: Self-pay | Admitting: Internal Medicine

## 2010-04-13 LAB — CONVERTED CEMR LAB: INR: 2

## 2010-04-18 LAB — CONVERTED CEMR LAB
ALT: 20 units/L (ref 0–53)
BUN: 21 mg/dL (ref 6–23)
Chloride: 105 meq/L (ref 96–112)
Creatinine, Ser: 1.5 mg/dL (ref 0.4–1.5)
Glucose, Bld: 110 mg/dL — ABNORMAL HIGH (ref 70–99)
Iron: 134 ug/dL (ref 42–165)
Vitamin B-12: 231 pg/mL (ref 211–911)

## 2010-04-23 ENCOUNTER — Telehealth (INDEPENDENT_AMBULATORY_CARE_PROVIDER_SITE_OTHER): Payer: Self-pay | Admitting: *Deleted

## 2010-05-02 ENCOUNTER — Ambulatory Visit: Payer: Self-pay | Admitting: Internal Medicine

## 2010-05-03 ENCOUNTER — Telehealth: Payer: Self-pay | Admitting: Internal Medicine

## 2010-05-17 ENCOUNTER — Ambulatory Visit
Admission: RE | Admit: 2010-05-17 | Discharge: 2010-05-17 | Payer: Self-pay | Source: Home / Self Care | Attending: Internal Medicine | Admitting: Internal Medicine

## 2010-05-22 ENCOUNTER — Telehealth: Payer: Self-pay | Admitting: Internal Medicine

## 2010-05-23 ENCOUNTER — Ambulatory Visit
Admission: RE | Admit: 2010-05-23 | Discharge: 2010-05-23 | Payer: Self-pay | Source: Home / Self Care | Attending: Internal Medicine | Admitting: Internal Medicine

## 2010-05-23 ENCOUNTER — Other Ambulatory Visit: Payer: Self-pay | Admitting: Internal Medicine

## 2010-05-23 ENCOUNTER — Encounter: Payer: Self-pay | Admitting: Internal Medicine

## 2010-05-23 LAB — BASIC METABOLIC PANEL
BUN: 22 mg/dL (ref 6–23)
CO2: 26 mEq/L (ref 19–32)
Calcium: 8.8 mg/dL (ref 8.4–10.5)
Chloride: 107 mEq/L (ref 96–112)
Creatinine, Ser: 1.3 mg/dL (ref 0.4–1.5)
GFR: 55.16 mL/min — ABNORMAL LOW (ref >60.00)
Glucose, Bld: 147 mg/dL — ABNORMAL HIGH (ref 70–99)
Potassium: 4.6 mEq/L (ref 3.5–5.1)
Sodium: 140 mEq/L (ref 135–145)

## 2010-05-23 LAB — TSH: TSH: 3.34 u[IU]/mL (ref 0.35–5.50)

## 2010-05-23 LAB — LIPID PANEL
Cholesterol: 156 mg/dL (ref 0–200)
HDL: 32.4 mg/dL — ABNORMAL LOW (ref >39.00)
Total CHOL/HDL Ratio: 5
Triglycerides: 229 mg/dL — ABNORMAL HIGH (ref 0.0–149.0)
VLDL: 45.8 mg/dL — ABNORMAL HIGH (ref 0.0–40.0)

## 2010-05-23 LAB — LDL CHOLESTEROL, DIRECT: Direct LDL: 89.8 mg/dL

## 2010-05-23 LAB — CONVERTED CEMR LAB: Vit D, 25-Hydroxy: 20 ng/mL — ABNORMAL LOW (ref 30–89)

## 2010-05-23 LAB — PSA: PSA: 2.2 ng/mL (ref 0.10–4.00)

## 2010-06-12 NOTE — Procedures (Signed)
Summary: pcp device check/mj   Allergies (verified): 1)  * Ace Inhibitors Group 2)  Maxzide (Triamterene-Hctz) 3)  Codeine Phosphate (Codeine Phosphate)  PPM Specifications Following MD:  Lewayne Bunting, MD     Referring MD:  [facility] PPM Vendor:  Med[employer]onic     PPM Model Number:  [id number]     PPM Serial Number:  [id number] PPM DOI:  08/23/1993      Lead 1    Location: RV     DOI: 08/23/1993     Model #: 431-07     Serial #:     Status: active  Magnet Response Rate:  BOL 85 ERI 65  Indications:  A-FIB     PPM Follow Up Battery Voltage:  2.68 V     Pacer Dependent:  No     Right Ventricle  Amplitude: 12 mV, Impedance: 523 ohms, Threshold: 2.5 V at 0.10 msec  Parameters Mode:  VVI     Lower Rate Limit:  60     Tech Comments:  No parameter changes.  Device function nonrmal.  There is limited diagnostics because of the age of this device.  ROV 6 months with Dr. Ladona Ridgel. Altha Harm, LPN  April 11, 2010 8:58 AM

## 2010-06-12 NOTE — Progress Notes (Signed)
Summary: pt wants rx changed to 750 mg metformin//Paz see  Phone Note Call from Patient   Caller: Patient Summary of Call: pt informed of labwork,  IN REF TO METFORMIN NEW DIRECTION 500mg  1.5 tabs bid , HARD TO CUT HIS METFORMIN IN HALF (He has a pill cutter and it does not work for him, wants to know can he just take a 750 mg tab? (rx comes in a generic xr tab)  PLEASE ADVISE   Pt uses [facility] Initial call taken by: Kandice Hams,  June 12, 2009 3:55 PM  Follow-up for Phone Call        ok to switch to metformin 850mg   two times a day  Jose E. Paz MD  June 12, 2009 5:00 PM  rx faxed pt has been informed of change .Kandice Hams  June 13, 2009 8:57 AM  Follow-up by: Kandice Hams,  June 13, 2009 8:58 AM    New/Updated Medications: METFORMIN HCL 850 MG TABS (METFORMIN HCL) 1 by mouth two times a day ERGOCALCIFEROL 50000 UNIT CAPS (ERGOCALCIFEROL) 1 by mouth weekly for 3 mos Prescriptions: METFORMIN HCL 850 MG TABS (METFORMIN HCL) 1 by mouth two times a day  #60 x 3   Entered by:   Kandice Hams   Authorized by:   Nolon Rod. Paz MD   Signed by:   Kandice Hams on 06/13/2009   Method used:   Faxed to ...       [facility]* (retail)       [address] [place], [place]  [address]       Ph: [phone]       Fax: [phone]   RxID:   [id number] ERGOCALCIFEROL 50000 UNIT CAPS (ERGOCALCIFEROL) 1 by mouth weekly for 3 mos  #12 x 0   Entered by:   Kandice Hams   Authorized by:   Nolon Rod. Paz MD   Signed by:   Kandice Hams on 06/12/2009   Method used:   Faxed to ...       [facility]* (retail)       [address] [place], [place]  [address]       Ph: [phone]       Fax: [phone]   RxID:   [id number]

## 2010-06-12 NOTE — Assessment & Plan Note (Signed)
Summary: pt//lch  Nurse Visit   Vital Signs:  Patient profile:   77 year old male Height:      67.75 inches Weight:      195 pounds Temp:     98.1 degrees F oral Pulse rate:   66 / minute BP sitting:   110 / 78  (left arm)  Vitals Entered By: Jeremy Johann CMA (October 30, 2009 3:48 PM) CC: pt/inr, b12   Allergies: 1)  * Ace Inhibitors Group 2)  Maxzide (Triamterene-Hctz) 3)  Codeine Phosphate (Codeine Phosphate) Laboratory Results   Blood Tests    Date/Time Reported: October 30, 2009 3:49 PM   INR: 2.0   (Normal Range: 0.88-1.12   Therap INR: 2.0-3.5) Comments: current dose 3 mg daily.Marland KitchenMarland KitchenMarland KitchenFelecia Deloach CMA  October 30, 2009 3:52 PM  no change, repeat INR in 4 weeks Feleica Fulmore E. Labrenda Lasky MD  October 30, 2009 4:37 PM  DISCUSS WITH PATIENT..............Marland KitchenFelecia Deloach CMA  October 30, 2009 4:55 PM     Medication Administration  Injection # 1:    Medication: Vistaril 25mg     Diagnosis: VITAMIN B12 DEFICIENCY (ICD-266.2)    Route: IM    Site: L deltoid    Exp Date: 08/12/2011    Lot #: [id number]    Mfr: [employer]    Patient tolerated injection without complications    Given by: Jeremy Johann CMA (October 30, 2009 4:58 PM)  Orders Added: 1)  Protime [62952WU] 2)  Est. Patient Level I [13244] 3)  Admin of Therapeutic Inj  intramuscular or subcutaneous [96372] 4)  Vistaril 25mg  [J3410]

## 2010-06-12 NOTE — Progress Notes (Signed)
Summary: ok #30, no RF. NEEDS OV -INR  Phone Note Outgoing Call   Summary of Call: okay to call  metformin for one month, no further refills without the office visit He is also due for an INR, needs a office visit ASAP Jose E. Paz MD  April 09, 2010 5:36 PM   Follow-up for Phone Call        Pt has an OV for friday. Follow-up by: Army Fossa CMA,  April 10, 2010 8:15 AM    Prescriptions: METFORMIN HCL 850 MG TABS (METFORMIN HCL) 1 by mouth two times a day  #60 Each x 0   Entered by:   Army Fossa CMA   Authorized by:   Nolon Rod. Paz MD   Signed by:   Army Fossa CMA on 04/10/2010   Method used:   Electronically to        [facility]* (retail)       [address] [place], [place]  [address]       Ph: [phone]       Fax: [phone]   RxID:   [id number]

## 2010-06-12 NOTE — Assessment & Plan Note (Signed)
Summary: 3 MTH FU/KDC   Vital Signs:  Patient profile:   77 year old male Height:      67.75 inches Weight:      193.8 pounds BP sitting:   122 / 80  Vitals Entered By: Shary Decamp (June 07, 2009 8:07 AM) CC: rov, not fasting   History of Present Illness: DIABETES -- no ambulatory CBGs   HYPERLIPIDEMIA -- still off zetia d/t cost  ATRIAL FIBRILLATION  --on coumadin, good medication compliance   HYPOTHYROIDISM -- good medication compliance   BPH-- no recent urology visit (last was years ago)  yearly checkup, chart reviewed  Current Medications (verified): 1)  Fenofibrate Micronized 134 Mg Caps (Fenofibrate Micronized) .[name].. 1 By Mouth Once Daily 2)  Pravastatin Sodium 40 Mg  Tabs (Pravastatin Sodium) .... 2 By Mouth Qd 3)  Atenolol 50 Mg Tabs (Atenolol) .... 2 By Mouth Once Daily 4)  Amlodipine Besylate 5 Mg Tabs (Amlodipine Besylate) .... 1/2 By Mouth Once Daily 5)  Metformin Hcl 500 Mg  Tabs (Metformin Hcl) .... Take 1  Two Times A Day 6)  Levothyroxine Sodium 75 Mcg Tabs (Levothyroxine Sodium) .[name].. 1 By Mouth Once Daily 7)  Bayer Low Strength   Tbec (Aspirin Tbec) .... Take 1 Tablet By Mouth Once A Day 8)  Coumadin 4 Mg Tabs (Warfarin Sodium) .... As Directed 9)  Coumadin 3 Mg Tabs (Warfarin Sodium) .... As Directed 10)  Omeprazole 40 Mg  Cpdr (Omeprazole) .[name].. 1 Each Day 30 Minutes Before Meal 11)  Nitroglycerin 0.4 Mg Subl (Nitroglycerin) .... One Tablet Under Tongue Every 5 Minutes As Needed For Chest Pain---May Repeat Times Three 12)  Ferrous Sulfate 325 (65 Fe) Mg  Tabs (Ferrous Sulfate) .[name].. 1 By Mouth Qd 13)  Alprazolam 0.25 Mg Tabs (Alprazolam) .... 1/2 -1 Tablet 1 Hour Before Flying  Allergies (verified): 1)  * Ace Inhibitors Group 2)  Maxzide (Triamterene-Hctz) 3)  Codeine Phosphate (Codeine Phosphate)  Past History:  Past Medical History: iron deficiency anemia: 12-2008:colonoscopy diverticuli, EGD hiatal hernia DIABETES MELLITUS, TYPE II   CAD HYPERLIPIDEMIA  ATRIAL FIBRILLATION  --on coumadin HYPOTHYROIDISM  BPH-- reports aprocedure (?TURP) remotely in [facility] G[facility]   INSOMNIA, TRANSIENT  VITAMIN B12 DEFICIENCY  Carotid U/S 1-09: 0 to 39%   Osteopenia  Past Surgical History: PACEMAKER, PERMANENT (ICD-V45.01) [id number] Medtronic Minix 8341 CHOLECYSTECTOMY, LAPAROSCOPIC, HX OF  PROSTATECTOMY, TRANSURETHRAL, HX OF   INGUINAL HERNIORRHAPHIES, BILATERAL, HX OF  HIP REPLACEMENT, RIGHT, HX OF  CORONARY ARTERY BYPASS GRAFT, HX OF (ICD-V45.81), 1999 stents in 2000 PENILE PROSTHESIS  1992  Family History: Heart Disease: Brother x 2, Father prostate ca--no Colon Cance--: no DM-- + several siblings   Social History: lives by self , independent on ADL retired separeted from wife daughter [name] lives in same neiborhood Patient has never smoked.  Alcohol Use - yes-rare Daily Caffeine Use-2 cups daily Illicit Drug Use - no Patient does not get regular exercise.  diet-- not as good as it should be  exercise -- no  Review of Systems       not taking calcium or vitamin D denies chest pain, shortness of breath, palpitations denies difficulty urinating or gross hematuria no nausea, vomiting, diarrhea, blood in stools, GERD symptoms sleeping well, no problems with anxiety or depression at this point  Physical Exam  General:  alert and well-developed.   Lungs:  normal respiratory effort, no intercostal retractions, no accessory muscle use, and normal breath sounds.   Heart:  irregular rhythm, and no  murmur.   Abdomen:  soft, non-tender, no distention, and no masses.   Rectal:  external hemorrhoids noted. Normal sphincter tone. No rectal masses or tenderness. Prostate:  Prostate gland firm and smooth, no enlargement, nodularity, tenderness, mass, asymmetry or induration. Pulses:  normal pedal pulses Extremities:  no edema  Diabetes Management Exam:    Foot Exam (with socks and/or shoes not present):        Sensory-Pinprick/Light touch:          Left medial foot (L-4): normal          Left dorsal foot (L-5): normal          Left lateral foot (S-1): normal          Right medial foot (L-4): normal          Right dorsal foot (L-5): normal          Right lateral foot (S-1): normal       Sensory-Monofilament:          Left foot: normal          Right foot: normal       Inspection:          Left foot: normal          Right foot: normal       Nails:          Left foot: normal          Right foot: normal   Impression & Recommendations:  Problem # 1:  OSTEOPENIA (ICD-733.90) DEXA:12-08--hip osteopenia ,not on  Ca and Vit D rec DEXA   Problem # 2:  HEALTH SCREENING (ICD-V70.0) pneumonia shot 11-08 TD 2009 had a flu shot  printed material provided regards shingles shot   Cscope 2004, diverticuli; repeated colonoscopy 8- 2010   showed diverticuli  Problem # 3:  HYPERTROPHY PROSTATE W/O UR OBST & OTH LUTS (ICD-600.00)  PSA  11-08 was  3 .12 ,   8-09 was 2.63 checking a PSA today  Problem # 4:  ANEMIA, IRON DEFICIENCY (ICD-280.9) status post a workup last year, check a CBC His updated medication list for this problem includes:    Ferrous Sulfate 325 (65 Fe) Mg Tabs (Ferrous sulfate) .[name]... 1 by mouth qd  Orders: Admin of Therapeutic Inj  intramuscular or subcutaneous ([address]) Vit B12 1000 mcg (J3420) Venipuncture ([address])  Problem # 5:  DIABETES MELLITUS, TYPE II (ICD-250.00) labs reminded to half his is checked yearly His updated medication list for this problem includes:    Metformin Hcl 500 Mg Tabs (Metformin hcl) .[name]... Take 1  two times a day    Bayer Low Strength Tbec (Aspirin tbec) .[name]... Take 1 tablet by mouth once a day  Labs Reviewed: Creat: 1.4 (02/28/2009)    Reviewed HgBA1c results: 6.7 (02/14/2009)  7.1 (10/12/2008)  Problem # 6:  HYPERTENSION (ICD-401.9) at goal  His updated medication list for this problem includes:    Atenolol 50 Mg Tabs (Atenolol) .[name]... 2 by  mouth once daily    Amlodipine Besylate 5 Mg Tabs (Amlodipine besylate) .[name]... 1/2 by mouth once daily  BP today: 122/80 Prior BP: 110/80 (05/16/2009)  Labs Reviewed: K+: 4.8 (02/28/2009) Creat: : 1.4 (02/28/2009)   Chol: 144 (06/14/2008)   HDL: 26.1 (06/14/2008)   LDL: 86 (06/14/2008)   TG: 160 (06/14/2008)  Problem # 7:  HYPOTHYROIDISM (ICD-244.9) no change His updated medication list for this problem includes:    Levothyroxine Sodium 75 Mcg Tabs (Levothyroxine sodium) .Marland KitchenMarland KitchenMarland KitchenMarland Kitchen  1 by mouth once daily  Labs Reviewed: TSH: 3.13 (02/14/2009)    HgBA1c: 6.7 (02/14/2009) Chol: 144 (06/14/2008)   HDL: 26.1 (06/14/2008)   LDL: 86 (06/14/2008)   TG: 160 (06/14/2008)  Problem # 8:  HYPERLIPIDEMIA (ICD-272.4) not fasting today, check FLP on return to the office  His updated medication list for this problem includes:    Fenofibrate Micronized 134 Mg Caps (Fenofibrate micronized) .[name]... 1 by mouth once daily    Pravastatin Sodium 40 Mg Tabs (Pravastatin sodium) .[name]... 2 by mouth qd  Labs Reviewed: SGOT: 19 (10/12/2008)   SGPT: 19 (10/12/2008)   HDL:26.1 (06/14/2008), 26.7 (03/16/2008)  LDL:86 (06/14/2008), 51 (03/16/2008)  Chol:144 (06/14/2008), 115 (03/16/2008)  Trig:160 (06/14/2008), 189 (03/16/2008)  Problem # 9:  ATRIAL FIBRILLATION (ICD-427.31) continue with Coumadin His updated medication list for this problem includes:    Atenolol 50 Mg Tabs (Atenolol) .[name]... 2 by mouth once daily    Amlodipine Besylate 5 Mg Tabs (Amlodipine besylate) .[name]... 1/2 by mouth once daily    Bayer Low Strength Tbec (Aspirin tbec) .[name]... Take 1 tablet by mouth once a day    Coumadin 4 Mg Tabs (Warfarin sodium) .[name]... As directed    Coumadin 3 Mg Tabs (Warfarin sodium) .[name]... As directed  Orders: Venipuncture (04540) TLB-PT (Protime) (85610-PTP)  Problem # 10:  VITAMIN B12 DEFICIENCY (ICD-266.2) continue with shots, labs  Complete Medication List: 1)  Fenofibrate Micronized 134 Mg Caps (Fenofibrate  micronized) .[name].. 1 by mouth once daily 2)  Pravastatin Sodium 40 Mg Tabs (Pravastatin sodium) .... 2 by mouth qd 3)  Atenolol 50 Mg Tabs (Atenolol) .... 2 by mouth once daily 4)  Amlodipine Besylate 5 Mg Tabs (Amlodipine besylate) .... 1/2 by mouth once daily 5)  Metformin Hcl 500 Mg Tabs (Metformin hcl) .... Take 1  two times a day 6)  Levothyroxine Sodium 75 Mcg Tabs (Levothyroxine sodium) .[name].. 1 by mouth once daily 7)  Bayer Low Strength Tbec (Aspirin tbec) .... Take 1 tablet by mouth once a day 8)  Coumadin 4 Mg Tabs (Warfarin sodium) .... As directed 9)  Coumadin 3 Mg Tabs (Warfarin sodium) .... As directed 10)  Omeprazole 40 Mg Cpdr (Omeprazole) .[name].. 1 each day 30 minutes before meal 11)  Nitroglycerin 0.4 Mg Subl (Nitroglycerin) .... One tablet under tongue every 5 minutes as needed for chest pain---may repeat times three 12)  Ferrous Sulfate 325 (65 Fe) Mg Tabs (Ferrous sulfate) .[name].. 1 by mouth qd 13)  Alprazolam 0.25 Mg Tabs (Alprazolam) .... 1/2 -1 tablet 1 hour before flying  Other Orders: T-Vitamin D (25-Hydroxy) (98119-14782) Radiology Referral (Radiology)  Patient Instructions: 1)  Please schedule a follow-up appointment in 4 months . 2)  remembered to have your eyes checked yearly    Medication Administration  Injection # 1:    Medication: Vit B12 1000 mcg    Diagnosis: ANEMIA, IRON DEFICIENCY (ICD-280.9)    Route: IM    Site: L deltoid    Exp Date: 02/2011    Lot #: [id number]    Patient tolerated injection without complications    Given by: Shary Decamp (June 07, 2009 8:18 AM)  Orders Added: 1)  Venipuncture [95621] 2)  TLB-PT (Protime) [85610-PTP] 3)  Admin of Therapeutic Inj  intramuscular or subcutaneous [96372] 4)  Vit B12 1000 mcg [J3420] 5)  Venipuncture [36415] 6)  T-Vitamin D (25-Hydroxy) [30865-78469] 7)  Radiology Referral [Radiology] 8)  Est. Patient Level IV [62952]

## 2010-06-12 NOTE — Assessment & Plan Note (Signed)
Summary: pt//inr//lch  Nurse Visit   Vital Signs:  Patient profile:   77 year old male Height:      67.75 inches Weight:      193.38 pounds Pulse rate:   62 / minute BP sitting:   120 / 80  Vitals Entered By: Kandice Hams (Sep 29, 2009 4:07 PM)  Allergies: 1)  * Ace Inhibitors Group 2)  Maxzide (Triamterene-Hctz) 3)  Codeine Phosphate (Codeine Phosphate) Laboratory Results   Blood Tests      INR: 2.4   (Normal Range: 0.88-1.12   Therap INR: 2.0-3.5) Comments: current dose 3 mg daily per Stacia no change recheck in 4 weeks pt informed .Kandice Hams  Sep 29, 2009 4:09 PM     Medication Administration  Injection # 1:    Medication: Vit B12 1000 mcg    Diagnosis: VITAMIN B12 DEFICIENCY (ICD-266.2)    Route: IM    Site: L deltoid    Exp Date: 02/11/2011    Lot #: [id number]    Mfr: [employer]    Patient tolerated injection without complications    Given by: Kandice Hams (Sep 29, 2009 4:15 PM)  Orders Added: 1)  Est. Patient Level I [04540] 2)  Protime [98119JY] 3)  Vit B12 1000 mcg [J3420] 4)  Admin of Therapeutic Inj  intramuscular or subcutaneous [78295]

## 2010-06-12 NOTE — Assessment & Plan Note (Signed)
Summary: pt check//ph  Nurse Visit   Vital Signs:  Patient profile:   77 year old male Weight:      195 pounds Pulse rate:   64 / minute BP sitting:   110 / 70  Vitals Entered By: Kandice Hams (July 13, 2009 8:48 AM)  Allergies: 1)  * Ace Inhibitors Group 2)  Maxzide (Triamterene-Hctz) 3)  Codeine Phosphate (Codeine Phosphate) Laboratory Results   Blood Tests      INR: 2.0   (Normal Range: 0.88-1.12   Therap INR: 2.0-3.5) Comments: current dose 3 mg daily.Kandice Hams  July 13, 2009 8:59 AM no change, recheck 4 weeks , notify patient  Nolon Rod. Paz MD  July 14, 2009 1:50 PM  PT INFORMED  .Shary Decamp  July 14, 2009 2:08 PM     Medication Administration  Injection # 3:    Medication: Vit B12 1000 mcg    Diagnosis: VITAMIN B12 DEFICIENCY (ICD-266.2)    Route: IM    Site: R deltoid    Exp Date: 09/11/2010    Lot #: [id number]    Mfr: [employer]    Patient tolerated injection without complications    Given by: Kandice Hams (July 13, 2009 9:03 AM)  Orders Added: 1)  Vit B12 1000 mcg [J3420] 2)  Est. Patient Level I [96045] 3)  Protime [40981XB] 4)  Admin of Therapeutic Inj  intramuscular or subcutaneous [14782]

## 2010-06-12 NOTE — Miscellaneous (Signed)
Summary: BONE DENSITY  Clinical Lists Changes  Orders: Added new Test order of T-Bone Densitometry (77080) - Signed Added new Test order of T-Lumbar Vertebral Assessment (77082) - Signed 

## 2010-06-12 NOTE — Miscellaneous (Signed)
Summary: Orders Update  Clinical Lists Changes 

## 2010-06-12 NOTE — Miscellaneous (Signed)
Summary: Orders Update  Clinical Lists Changes  Orders: Added new Test order of Abdominal Aorta Duplex (Abd Aorta Duplex) - Signed 

## 2010-06-12 NOTE — Assessment & Plan Note (Signed)
Summary: [name]   Referring Provider:  n/a Primary Provider:  [name], MD   History of Present Illness: Overall patient is doing well.  No chest pain.  He is great grandfather.  Pacer is 77 years old, checked today.  He is able to do most things that he wants.  Has had upper and lower endo, and Dr. Drue Novel has been following him closely.    Current Medications (verified): 1)  Fenofibrate Micronized 134 Mg Caps (Fenofibrate Micronized) .[name].. 1 By Mouth Once Daily 2)  Pravastatin Sodium 40 Mg  Tabs (Pravastatin Sodium) .... 2 By Mouth Qd 3)  Atenolol 50 Mg Tabs (Atenolol) .... 2 By Mouth Once Daily 4)  Amlodipine Besylate 5 Mg Tabs (Amlodipine Besylate) .... 1/2 By Mouth Once Daily 5)  Metformin Hcl 850 Mg Tabs (Metformin Hcl) .[name].. 1 By Mouth Two Times A Day 6)  Levothyroxine Sodium 75 Mcg Tabs (Levothyroxine Sodium) .[name].. 1 By Mouth Once Daily 7)  Bayer Low Strength   Tbec (Aspirin Tbec) .... Take 1 Tablet By Mouth Once A Day 8)  Coumadin 4 Mg Tabs (Warfarin Sodium) .... As Directed 9)  Coumadin 3 Mg Tabs (Warfarin Sodium) .... As Directed 10)  Omeprazole 40 Mg  Cpdr (Omeprazole) .[name].. 1 Each Day 30 Minutes Before Meal 11)  Nitroglycerin 0.4 Mg Subl (Nitroglycerin) .... One Tablet Under Tongue Every 5 Minutes As Needed For Chest Pain---May Repeat Times Three 12)  Ferrous Sulfate 325 (65 Fe) Mg  Tabs (Ferrous Sulfate) .[name].. 1 By Mouth Qd 13)  Alprazolam 0.25 Mg Tabs (Alprazolam) .... 1/2 -1 Tablet 1 Hour Before Flying 14)  Ergocalciferol 50000 Unit Caps (Ergocalciferol) .[name].. 1 By Mouth Weekly For 3 Mos 15)  Apatate 25 Mg/93ml Liqd (Vitamins B1 B6 B12) .[name].. 1 Every Month 16)  Calcium 600 1500 Mg Tabs (Calcium Carbonate) .[name].. 1 By Mouth Daily  Allergies (verified): 1)  * Ace Inhibitors Group 2)  Maxzide (Triamterene-Hctz) 3)  Codeine Phosphate (Codeine Phosphate)  Vital Signs:  Patient profile:   77 year old male Height:      67.75 inches Weight:      190 pounds BMI:     29.21 Pulse  rate:   70 / minute Resp:     16 per minute BP sitting:   108 / 68  (right arm)  Vitals Entered By: Marrion Coy, CNA (August 07, 2009 3:45 PM)  Physical Exam  General:  Well developed, well nourished, in no acute distress. Head:  normocephalic and atraumatic Eyes:  PERRLA/EOM intact; conjunctiva and lids normal. Lungs:  Clear bilaterally to auscultation and percussion. Heart:  Irregular irregular rhtyhm.     EKG  Procedure date:  08/07/2009  Findings:      Atrial fib with right bundle, and ventricular pacing  PPM Specifications PPM Vendor:  Medtronic     PPM Model Number:  [id number]     PPM Serial Number:  [id number] PPM DOI:  08/23/1993      Lead 1    Location: [facility]     DOI: 08/23/1993     Model #: 431-07     Serial #:     Status: active   Indications:  A-FIB     Impression & Recommendations:  Problem # 1:  CORONARY ARTERY DISEASE (ICD-414.00) Clinically remains stable on a multiple drug regimen.  See cath report of 2007.  Tolerating without difficulty. His updated medication list for this problem includes:    Atenolol 50 Mg Tabs (Atenolol) .[name]... 2 by  mouth once daily    Amlodipine Besylate 5 Mg Tabs (Amlodipine besylate) .[name]... 1/2 by mouth once daily    Bayer Low Strength Tbec (Aspirin tbec) .[name]... Take 1 tablet by mouth once a day    Coumadin 4 Mg Tabs (Warfarin sodium) .[name]... As directed    Coumadin 3 Mg Tabs (Warfarin sodium) .[name]... As directed    Nitroglycerin 0.4 Mg Subl (Nitroglycerin) ..... One tablet under tongue every 5 minutes as needed for chest pain---may repeat times three  Orders: EKG w/ Interpretation (93000)  Problem # 2:  HYPERLIPIDEMIA (ICD-272.4) On pravastatin and fenofibrate.  No problems with medications. His updated medication list for this problem includes:    Fenofibrate Micronized 134 Mg Caps (Fenofibrate micronized) .[name]... 1 by mouth once daily    Pravastatin Sodium 40 Mg Tabs (Pravastatin sodium) .[name]... 2 by mouth qd  Problem # 3:   ABDOMINAL AORTIC ANEURYSM,SMALL (ICD-441.4) 2.9 by 2.9.  Estimated to be followup in November 2011.  Patient Instructions: 1)  Your physician recommends that you continue on your current medications as directed. Please refer to the Current Medication list given to you today. 2)  Your physician wants you to follow-up in:  1 YEAR.  You will receive a reminder letter in the mail two months in advance. If you don't receive a letter, please call our office to schedule the follow-up appointment. 3)  Your physician recommends that you schedule a follow-up appointment in: 6 MONTHS with Dr Ladona Ridgel  Appended Document: [phone] Pacer implant was 1995.  Has follow up with Dr. Ladona Ridgel in six months. Implant is Medtronic [id number].

## 2010-06-12 NOTE — Assessment & Plan Note (Signed)
Summary: f/u,INR/drb   Vital Signs:  Patient profile:   77 year old male Height:      67.75 inches Weight:      193.2 pounds BMI:     29.70 O2 Sat:      97 % on Room air Pulse rate:   63 / minute Pulse rhythm:   regular Resp:     16 per minute BP sitting:   128 / 70  (right arm)  O2 Flow:  Room air CC: Patient is here to have his PT?INR checked and to followup with Dr. Drue Novel.   History of Present Illness: ROV DIABETES -- no ambulatory CBGs   HYPERLIPIDEMIA -- good medication compliance  ATRIAL FIBRILLATION  --on coumadin, good medication compliance  HYPOTHYROIDISM --no medication compliance x how long?    Allergies: 1)  * Ace Inhibitors Group 2)  Maxzide (Triamterene-Hctz) 3)  Codeine Phosphate (Codeine Phosphate)  Past History:  Past Medical History: iron deficiency anemia: 12-2008:colonoscopy diverticuli, EGD hiatal hernia DIABETES MELLITUS, TYPE II  CAD HYPERLIPIDEMIA  ATRIAL FIBRILLATION  --on coumadin HYPOTHYROIDISM  BPH-- reports aprocedure (?TURP) remotely in [facility]   INSOMNIA, TRANSIENT  VITAMIN B12 DEFICIENCY  Carotid U/S 1-09: 0 to 39%   Osteopenia  Past Surgical History: Reviewed history from 06/07/2009 and no changes required. PACEMAKER, PERMANENT (ICD-V45.01) [id number] [employer] Minix 8341 CHOLECYSTECTOMY, LAPAROSCOPIC, HX OF  PROSTATECTOMY, TRANSURETHRAL, HX OF   INGUINAL HERNIORRHAPHIES, BILATERAL, HX OF  HIP REPLACEMENT, RIGHT, HX OF  CORONARY ARTERY BYPASS GRAFT, HX OF (ICD-V45.81), 1999 stents in 2000 PENILE PROSTHESIS  1992  Social History: Reviewed history from 08/02/2009 and no changes required. lives by self , independent on ADL retired separeted from wife daughter [name] lives in same neiborhood Patient has never smoked.  Alcohol Use - yes-rare Daily Caffeine Use-2 cups daily Illicit Drug Use - no Patient does not get regular exercise.  diet-- not as good as it should be  exercise -- no  Review of Systems CV:  Denies chest  pain or discomfort, palpitations, and swelling of feet. Resp:  Denies cough and shortness of breath.  Physical Exam  General:  alert and well-developed.   Lungs:  normal respiratory effort, no intercostal retractions, no accessory muscle use, and normal breath sounds.   Heart:  irregular rhythm, and no murmur.   Extremities:  no edema Psych:  Oriented X3, memory intact for recent and remote, normally interactive, good eye contact, not anxious appearing, and not depressed appearing.     Impression & Recommendations:  Problem # 1:  HYPERTENSION (ICD-401.9) at goal  His updated medication list for this problem includes:    Atenolol 50 Mg Tabs (Atenolol) .[name]... 2 by mouth once daily    Amlodipine Besylate 5 Mg Tabs (Amlodipine besylate) .[name]... 1/2 by mouth once daily  BP today: 128/70 Prior BP: 112/72 (12/12/2009)  Labs Reviewed: K+: 4.8 (02/28/2009) Creat: : 1.4 (02/28/2009)   Chol: 144 (06/14/2008)   HDL: 26.1 (06/14/2008)   LDL: 86 (06/14/2008)   TG: 160 (06/14/2008)  Orders: TLB-BMP (Basic Metabolic Panel-BMET) (80048-METABOL)  Problem # 2:  ANEMIA, IRON DEFICIENCY (ICD-280.9) last colonoscopy 8-10, showed diverticuli and hemorrhoids. Labs Consider discontinue iron  His updated medication list for this problem includes:    Ferrous Sulfate 325 (65 Fe) Mg Tabs (Ferrous sulfate) .[name]... 1 by mouth qd  Orders: Venipuncture (66440) TLB-IBC Pnl (Iron/FE;Transferrin) (83550-IBC) TLB-B12 + Folate Pnl (82746_82607-B12/FOL)  Problem # 3:  DIABETES MELLITUS, TYPE II (ICD-250.00) due for labs provide a flu shot  today His updated medication list for this problem includes:    Metformin Hcl 850 Mg Tabs (Metformin hcl) .[name]... 1 by mouth two times a day    Bayer Low Strength Tbec (Aspirin tbec) .[name]... Take 1 tablet by mouth once a day    Labs Reviewed: Creat: 1.4 (02/28/2009)    Reviewed HgBA1c results: 7.5 (06/07/2009)  6.7 (02/14/2009)  Orders: TLB-A1C / Hgb A1C (Glycohemoglobin)  (83036-A1C)  Problem # 4:  CORONARY ARTERY DISEASE (ICD-414.00) asymptomatic His updated medication list for this problem includes:    Atenolol 50 Mg Tabs (Atenolol) .[name]... 2 by mouth once daily    Amlodipine Besylate 5 Mg Tabs (Amlodipine besylate) .[name]... 1/2 by mouth once daily    Bayer Low Strength Tbec (Aspirin tbec) .[name]... Take 1 tablet by mouth once a day    Nitroglycerin 0.4 Mg Subl (Nitroglycerin) ..... One tablet under tongue every 5 minutes as needed for chest pain---may repeat times three  Problem # 5:  HYPERLIPIDEMIA (ICD-272.4) labs   needs a fasting lipid profile when  he comes back His updated medication list for this problem includes:    Fenofibrate Micronized 134 Mg Caps (Fenofibrate micronized) .[name]... 1 by mouth once daily    Pravastatin Sodium 40 Mg Tabs (Pravastatin sodium) .[name]... 2 by mouth qd    Labs Reviewed: SGOT: 18 (06/07/2009)   SGPT: 17 (06/07/2009)   HDL:26.1 (06/14/2008), 26.7 (03/16/2008)  LDL:86 (06/14/2008), 51 (03/16/2008)  Chol:144 (06/14/2008), 115 (03/16/2008)  Trig:160 (06/14/2008), 189 (03/16/2008)  Orders: TLB-ALT (SGPT) (84460-ALT) TLB-AST (SGOT) (84450-SGOT)  Problem # 6:  ATRIAL FIBRILLATION (ICD-427.31) on Coumadin Does not check his Coumadin regularly. Reminded patient the need to check his Coumadin regularly and the risk of bleeding  Problem # 7:  HYPOTHYROIDISM (ICD-244.9) poor compliance For how long? Refill medicines TSH on return to the office His updated medication list for this problem includes:    Levothyroxine Sodium 75 Mcg Tabs (Levothyroxine sodium) .[name]... 1 by mouth once daily  Labs Reviewed: TSH: 3.13 (02/14/2009)    HgBA1c: 7.5 (06/07/2009) Chol: 144 (06/14/2008)   HDL: 26.1 (06/14/2008)   LDL: 86 (06/14/2008)   TG: 160 (06/14/2008)  Complete Medication List: 1)  Fenofibrate Micronized 134 Mg Caps (Fenofibrate micronized) .[name].. 1 by mouth once daily 2)  Pravastatin Sodium 40 Mg Tabs (Pravastatin sodium) .... 2 by mouth  qd 3)  Atenolol 50 Mg Tabs (Atenolol) .... 2 by mouth once daily 4)  Amlodipine Besylate 5 Mg Tabs (Amlodipine besylate) .... 1/2 by mouth once daily 5)  Metformin Hcl 850 Mg Tabs (Metformin hcl) .[name].. 1 by mouth two times a day 6)  Levothyroxine Sodium 75 Mcg Tabs (Levothyroxine sodium) .[name].. 1 by mouth once daily 7)  Bayer Low Strength Tbec (Aspirin tbec) .... Take 1 tablet by mouth once a day 8)  Coumadin 3 Mg Tabs (Warfarin sodium) .... As directed 9)  Omeprazole 40 Mg Cpdr (Omeprazole) .[name].. 1 each day 30 minutes before meal 10)  Nitroglycerin 0.4 Mg Subl (Nitroglycerin) .... One tablet under tongue every 5 minutes as needed for chest pain---may repeat times three 11)  Ferrous Sulfate 325 (65 Fe) Mg Tabs (Ferrous sulfate) .[name].. 1 by mouth qd 12)  Alprazolam 0.25 Mg Tabs (Alprazolam) .... 1/2 -1 tablet 1 hour before flying 13)  Ergocalciferol 50000 Unit Caps (Ergocalciferol) .[name].. 1 by mouth weekly for 3 mos 14)  Apatate 25 Mg/64ml Liqd (Vitamins b1 b6 b12) .[name].. 1 every month 15)  Calcium 600 1500 Mg Tabs (Calcium carbonate) .[name].. 1 by  mouth daily  Other Orders: Protime (60454UJ) Flu Vaccine 69yrs + MEDICARE PATIENTS (W1191) Administration Flu vaccine - MCR (Y7829)  Patient Instructions: 1)  continue taking your Coumadin the same way 2)  Come back in 4 weeks for a complete physical exam 3)  Restart your thyroid medicine   Orders Added: 1)  Protime [85610QW] 2)  Venipuncture [36415] 3)  TLB-IBC Pnl (Iron/FE;Transferrin) [83550-IBC] 4)  TLB-B12 + Folate Pnl [82746_82607-B12/FOL] 5)  TLB-BMP (Basic Metabolic Panel-BMET) [80048-METABOL] 6)  TLB-ALT (SGPT) [84460-ALT] 7)  TLB-A1C / Hgb A1C (Glycohemoglobin) [83036-A1C] 8)  TLB-AST (SGOT) [84450-SGOT] 9)  Flu Vaccine 9yrs + MEDICARE PATIENTS [Q2039] 10)  Administration Flu vaccine - MCR [G0008] 11)  Est. Patient Level IV [56213]    Laboratory Results   Blood Tests      INR: 2.0   (Normal Range: 0.88-1.12   Therap INR:  2.0-3.5) Comments: Has 3 mg tabs  Takes 1 daily except 1 1/2 W,F ----------------------------- no change, 4 weeks Marquavius Scaife E. Deania Siguenza MD  April 13, 2010 12:33 PM              Flu Vaccine Consent Questions     Do you have a history of severe allergic reactions to this vaccine? no    Any prior history of allergic reactions to egg and/or gelatin? no    Do you have a sensitivity to the preservative Thimersol? no    Do you have a past history of Guillan-Barre Syndrome? no    Do you currently have an acute febrile illness? no    Have you ever had a severe reaction to latex? no    Vaccine information given and explained to patient? yes    Are you currently pregnant? no    Lot Number:[id number]   Exp Date:11/10/2010   Site Given  Left Deltoid IMedflu

## 2010-06-12 NOTE — Progress Notes (Signed)
Summary: [facility]  Phone Note Outgoing Call   Call placed by: Kandice Hams,  July 06, 2009 10:10 AM Summary of Call: Pt informed of  DEXA and Dr Drue Novel recommendations;  and will use the otc Calcium with Vitamin D,  2 a day for now.  Initial call taken by: Kandice Hams,  July 06, 2009 10:13 AM

## 2010-06-12 NOTE — Assessment & Plan Note (Signed)
Summary: pt check  Nurse Visit   Vital Signs:  Patient profile:   77 year old male Weight:      194.38 pounds Pulse rate:   60 / minute Pulse rhythm:   regular BP sitting:   112 / 80  (left arm) Cuff size:   regular  Vitals Entered By: Army Fossa CMA (November 27, 2009 4:10 PM) CC: Pt/Inr/b12   Current Medications (verified): 1)  Fenofibrate Micronized 134 Mg Caps (Fenofibrate Micronized) .[name].. 1 By Mouth Once Daily 2)  Pravastatin Sodium 40 Mg  Tabs (Pravastatin Sodium) .... 2 By Mouth Qd 3)  Atenolol 50 Mg Tabs (Atenolol) .... 2 By Mouth Once Daily 4)  Amlodipine Besylate 5 Mg Tabs (Amlodipine Besylate) .... 1/2 By Mouth Once Daily 5)  Metformin Hcl 850 Mg Tabs (Metformin Hcl) .[name].. 1 By Mouth Two Times A Day 6)  Levothyroxine Sodium 75 Mcg Tabs (Levothyroxine Sodium) .[name].. 1 By Mouth Once Daily 7)  Bayer Low Strength   Tbec (Aspirin Tbec) .... Take 1 Tablet By Mouth Once A Day 8)  Coumadin 3 Mg Tabs (Warfarin Sodium) .... As Directed 9)  Omeprazole 40 Mg  Cpdr (Omeprazole) .[name].. 1 Each Day 30 Minutes Before Meal 10)  Nitroglycerin 0.4 Mg Subl (Nitroglycerin) .... One Tablet Under Tongue Every 5 Minutes As Needed For Chest Pain---May Repeat Times Three 11)  Ferrous Sulfate 325 (65 Fe) Mg  Tabs (Ferrous Sulfate) .[name].. 1 By Mouth Qd 12)  Alprazolam 0.25 Mg Tabs (Alprazolam) .... 1/2 -1 Tablet 1 Hour Before Flying 13)  Ergocalciferol 50000 Unit Caps (Ergocalciferol) .[name].. 1 By Mouth Weekly For 3 Mos 14)  Apatate 25 Mg/10ml Liqd (Vitamins B1 B6 B12) .[name].. 1 Every Month 15)  Calcium 600 1500 Mg Tabs (Calcium Carbonate) .[name].. 1 By Mouth Daily  Allergies: 1)  * Ace Inhibitors Group 2)  Maxzide (Triamterene-Hctz) 3)  Codeine Phosphate (Codeine Phosphate)  Complete Medication List: 1)  Fenofibrate Micronized 134 Mg Caps (Fenofibrate micronized) .[name].. 1 by mouth once daily 2)  Pravastatin Sodium 40 Mg Tabs (Pravastatin sodium) .... 2 by mouth qd 3)  Atenolol 50 Mg Tabs (Atenolol) .... 2 by  mouth once daily 4)  Amlodipine Besylate 5 Mg Tabs (Amlodipine besylate) .... 1/2 by mouth once daily 5)  Metformin Hcl 850 Mg Tabs (Metformin hcl) .[name].. 1 by mouth two times a day 6)  Levothyroxine Sodium 75 Mcg Tabs (Levothyroxine sodium) .[name].. 1 by mouth once daily 7)  Bayer Low Strength Tbec (Aspirin tbec) .... Take 1 tablet by mouth once a day 8)  Coumadin 3 Mg Tabs (Warfarin sodium) .... As directed 9)  Omeprazole 40 Mg Cpdr (Omeprazole) .[name].. 1 each day 30 minutes before meal 10)  Nitroglycerin 0.4 Mg Subl (Nitroglycerin) .... One tablet under tongue every 5 minutes as needed for chest pain---may repeat times three 11)  Ferrous Sulfate 325 (65 Fe) Mg Tabs (Ferrous sulfate) .[name].. 1 by mouth qd 12)  Alprazolam 0.25 Mg Tabs (Alprazolam) .... 1/2 -1 tablet 1 hour before flying 13)  Ergocalciferol 50000 Unit Caps (Ergocalciferol) .[name].. 1 by mouth weekly for 3 mos 14)  Apatate 25 Mg/58ml Liqd (Vitamins b1 b6 b12) .[name].. 1 every month 15)  Calcium 600 1500 Mg Tabs (Calcium carbonate) .[name].. 1 by mouth daily  Other Orders: Protime (14782NF) Vit B12 1000 mcg (J3420) Admin of Therapeutic Inj  intramuscular or subcutaneous (62130)  Laboratory Results   Blood Tests      INR: 1.8   (Normal Range: 0.88-1.12   Therap  INR: 2.0-3.5) Comments: currently on 3 mg daily INR low New dose: 3 mg tablet one every day except Wednesday and Friday take 1.5 tablet   (increaase from 21 to 24mg  /week) Next INR in 2 weeks  Pt is aware. Army Fossa CMA  November 27, 2009 4:10 PM  Gate E. Jearline Hirschhorn MD  November 27, 2009 4:02 PM     Medication Administration  Injection # 1:    Medication: Vit B12 1000 mcg    Diagnosis: VITAMIN B12 DEFICIENCY (ICD-266.2)    Route: IM    Site: L deltoid    Exp Date: 07/2011    Lot #: [id number]    Mfr: [employer]    Patient tolerated injection without complications    Given by: Army Fossa CMA (November 28, 2009 8:19 AM)  Orders Added: 1)  Est. Patient Level I [87564] 2)  Protime  [33295JO] 3)  Vit B12 1000 mcg [J3420] 4)  Admin of Therapeutic Inj  intramuscular or subcutaneous [96372]  Laboratory Results   Blood Tests      INR: 1.8   (Normal Range: 0.88-1.12   Therap INR: 2.0-3.5) Comments: currently on 3 mg daily INR low New dose: 3 mg tablet one every day except Wednesday and Friday take 1.5 tablet   (increaase from 21 to 24mg  /week) Next INR in 2 weeks  Pt is aware. Army Fossa CMA  November 27, 2009 4:10 PM  Burley E. Sakshi Sermons MD  November 27, 2009 4:02 PM

## 2010-06-12 NOTE — Assessment & Plan Note (Signed)
Summary: pt check/kn  Nurse Visit   Vital Signs:  Patient profile:   77 year old male Weight:      194 pounds Pulse rate:   60 / minute BP sitting:   112 / 72  (left arm)  Vitals Entered By: Doristine Devoid CMA (December 12, 2009 3:58 PM)  Allergies: 1)  * Ace Inhibitors Group 2)  Maxzide (Triamterene-Hctz) 3)  Codeine Phosphate (Codeine Phosphate) Laboratory Results   Blood Tests      INR: 2.6   (Normal Range: 0.88-1.12   Therap INR: 2.0-3.5) Comments: current dose: 3 mg tablet one every day except Wednesday and Friday take 1.5 tablet Chemira Jones CMA  December 12, 2009 3:59 PM  advice patient, INR  better Continue with present Coumadin dose Recheck INR in 3 weeks Jose E. Paz MD  December 12, 2009 6:17 PM   Spoke with pt he is aware. Army Fossa CMA  December 13, 2009 10:27 AM     Orders Added: 1)  Est. Patient Level I [99211] 2)  Protime [16109UE]

## 2010-06-12 NOTE — Progress Notes (Signed)
Summary: Diagnosis code  Phone Note Other Incoming   Caller: april/[facility] [phone] ext [phone] Summary of Call: april is requesting a diagnosis code from date of service on Jan 26,2011 on a psa Initial call taken by: Barb Merino,  June 12, 2009 9:43 AM  Follow-up for Phone Call        left msg on VM dx code 600.00 hypertrophy prostate .Kandice Hams  June 12, 2009 10:47 AM  Follow-up by: Kandice Hams,  June 12, 2009 10:47 AM

## 2010-06-12 NOTE — Progress Notes (Signed)
Summary: due nurse visit  Phone Note Outgoing Call Call back at [facility] Phone [phone] Call back at Work Phone [phone]   Summary of Call: Last PT/INR check 07/2009....Marland Kitchenneeds nurse visit Shary Decamp  Sep 27, 2009 4:43 PM   Follow-up for Phone Call        Patient is coming in 5.20.11 Follow-up by: Harold Barban,  Sep 28, 2009 8:56 AM

## 2010-06-12 NOTE — Procedures (Signed)
Summary: [facility] Device [facility]   Allergies: 1)  * Ace Inhibitors Group 2)  Maxzide (Triamterene-Hctz) 3)  Codeine Phosphate (Codeine Phosphate)  PPM Specifications Following MD:  Lewayne Bunting, MD     Referring MD:  [facility] PPM Vendor:  [employer]     PPM Model Number:  [id number]     PPM Serial Number:  [id number] PPM DOI:  08/23/1993      Lead 1    Location: RV     DOI: 08/23/1993     Model #: [id number]     Serial #:     Status: active   Indications:  A-FIB     PPM Follow Up Remote Check?  No Battery Voltage:  2.70 V     Pacer Dependent:  No     Right Ventricle  Amplitude: >5 mV, Impedance: 540 ohms, Threshold: 0.8 V at 0.5 msec  Parameters Mode:  VVI     Lower Rate Limit:  60     Next Cardiology Appt Due:  01/11/2010 Tech Comments:  Pacer check today with Dr [name].  Normal device function.  No histagrams or other diagnostic data on this device.  Patient is not dependent.  ERI voltage is 2.50.  At [facility] device functions at programmed rate minus 20%.  No changes made today.  ROV 6 months GT. Gypsy Balsam RN BSN  August 07, 2009 4:38 PM  MD Comments:  Agree with above.

## 2010-06-12 NOTE — Assessment & Plan Note (Signed)
Summary: pt/inr  Nurse Visit   Vital Signs:  Patient profile:   77 year old male Weight:      194 pounds BP sitting:   110 / 80  Vitals Entered By: Shary Decamp (May 16, 2009 8:44 AM)  Allergies: 1)  * Ace Inhibitors Group 2)  Maxzide (Triamterene-Hctz) 3)  Codeine Phosphate (Codeine Phosphate) Laboratory Results   Blood Tests     PT: 17.5 s   (Normal Range: 10.6-13.4)  INR: 2.0   (Normal Range: 0.88-1.12   Therap INR: 2.0-3.5) Comments: CURRENT DOSE - 3MG  DAILY NO CHANGE Patient has ROV scheduled with Dr. Drue Novel on 06/07/2008 Shary Decamp  May 16, 2009 8:44 AM no change,next INR in  two weeks Veron Senner E. Oluwatomisin Hustead MD  May 16, 2009 4:57 PM     Medication Administration  Injection # 1:    Medication: Vit B12 1000 mcg    Diagnosis: ANEMIA, IRON DEFICIENCY (ICD-280.9)    Route: IM    Site: L deltoid    Exp Date: 02/2011    Lot #: [id number]    Patient tolerated injection without complications    Given by: Shary Decamp (May 16, 2009 8:45 AM)  Orders Added: 1)  Vit B12 1000 mcg [J3420] 2)  Admin of Therapeutic Inj  intramuscular or subcutaneous [96372] 3)  Est. Patient Level I [02725] 4)  Protime [36644IH]

## 2010-06-14 NOTE — Progress Notes (Signed)
Summary: last DEXA  2-11 : osteopenia. Cancel DEXA referal  Phone Note Outgoing Call   Call placed by: Magdalen Spatz [facility],  May 22, 2010 11:17 AM Summary of Call: REFERENCE BONE DENSITY REFERRAL...Marland KitchenPATIENT NOT DUE UNTIL AFTER 06/16/2011.  HIS LAST BMD WAS JUST 06/15/2009 @ [facility].  I WILL INFORM PT. Initial call taken by: Magdalen Spatz [facility],  May 22, 2010 11:18 AM  Follow-up for Phone Call        noted , thank you  Follow-up by: [facility] E. Gentry Pilson MD,  May 23, 2010 9:51 AM

## 2010-06-14 NOTE — Assessment & Plan Note (Signed)
Summary: bp check, bmp/drb  Nurse Visit   Vital Signs:  Patient profile:   77 year old male BP sitting:   90 / 56  (left arm) CC: BP check./kb Comments Patient came today for BP check and it was 90/56. This is not normal for him, it is on the low side and he would like to be sure that he does not need to do anything differently or have any changes to his meds. He notes that he has not had any dizziness or fainting.. Please advise.   Allergies: 1)  * Ace Inhibitors Group 2)  Maxzide (Triamterene-Hctz) 3)  Codeine Phosphate (Codeine Phosphate)  Impression & Recommendations:  Problem # 1:  HYPERTENSION (ICD-401.9) recently started on Lasix due to hyperkalemia. his BP is low, he is asymptomatic advise patient------  decrease atenolol to one tablet daily. check his BPs at home, to  call if they are more than 140/85 or less than 100/60 Jose E. Paz MD  May 02, 2010 6:40 PM    His updated medication list for this problem includes:    Atenolol 50 Mg Tabs (Atenolol) .[name]... 2 by mouth once daily    Amlodipine Besylate 5 Mg Tabs (Amlodipine besylate) .[name]... 1/2 by mouth once daily    Lasix 20 Mg Tabs (Furosemide) .[name]... 1 by mouth daily.  Complete Medication List: 1)  Fenofibrate Micronized 134 Mg Caps (Fenofibrate micronized) .[name].. 1 by mouth once daily 2)  Pravastatin Sodium 40 Mg Tabs (Pravastatin sodium) .... 2 by mouth qd 3)  Atenolol 50 Mg Tabs (Atenolol) .... 2 by mouth once daily 4)  Amlodipine Besylate 5 Mg Tabs (Amlodipine besylate) .... 1/2 by mouth once daily 5)  Metformin Hcl 850 Mg Tabs (Metformin hcl) .[name].. 1 by mouth two times a day 6)  Levothyroxine Sodium 75 Mcg Tabs (Levothyroxine sodium) .[name].. 1 by mouth once daily 7)  Bayer Low Strength Tbec (Aspirin tbec) .... Take 1 tablet by mouth once a day 8)  Coumadin 3 Mg Tabs (Warfarin sodium) .... As directed 9)  Omeprazole 40 Mg Cpdr (Omeprazole) .[name].. 1 each day 30 minutes before meal 10)  Nitroglycerin 0.4 Mg Subl  (Nitroglycerin) .... One tablet under tongue every 5 minutes as needed for chest pain---may repeat times three 11)  Alprazolam 0.25 Mg Tabs (Alprazolam) .... 1/2 -1 tablet 1 hour before flying 12)  Ergocalciferol 50000 Unit Caps (Ergocalciferol) .[name].. 1 by mouth weekly for 3 mos 13)  Apatate 25 Mg/83ml Liqd (Vitamins b1 b6 b12) .[name].. 1 every month 14)  Calcium 600 1500 Mg Tabs (Calcium carbonate) .[name].. 1 by mouth daily 15)  Lasix 20 Mg Tabs (Furosemide) .[name].. 1 by mouth daily.   Orders Added: 1)  Est. Patient Level I [19147]

## 2010-06-14 NOTE — Progress Notes (Signed)
Summary: refill  Phone Note Refill Request Message from:  Fax from Pharmacy on April 23, 2010 9:41 AM  Refills Requested: Medication #1:  METFORMIN HCL 850 MG TABS 1 by mouth two times a day sams - fax [phone]  Initial call taken by: Okey Regal [place],  April 23, 2010 9:42 AM    Prescriptions: METFORMIN HCL 850 MG TABS (METFORMIN HCL) 1 by mouth two times a day  #60 Each x 2   Entered by:   Army Fossa CMA   Authorized by:   Nolon Rod. Paz MD   Signed by:   Army Fossa CMA on 04/23/2010   Method used:   Electronically to        [facility]* (retail)       [address] [place], [place]  [address]       Ph: [phone]       Fax: [phone]   RxID:   [id number]

## 2010-06-14 NOTE — Progress Notes (Signed)
Summary: labwork  Phone Note Outgoing Call   Summary of Call: Pt needs labwork- BMP- 401.9 . Please schedule. Initial call taken by: Army Fossa CMA,  May 03, 2010 3:22 PM  Follow-up for Phone Call        ALSO.[name]  decrease atenolol to one tablet daily. check his BPs at home, to  call if they are more than 140/85 or less than 100/60 Jose E. Paz MD  May 04, 2010 1:26 PM   Additional Follow-up for Phone Call Additional follow up Details #1::        spoke with the patient and advised him of the above and he stated he thinks his BP reading at the office was a mistake. He stated when he got home his BP 133/89. He has concerns about changing the meds since he thinks the BP in the office was an error. Please advise if there is anything you would like to do different. Additional Follow-up by: Almeta Monas CMA Duncan Dull),  May 04, 2010 1:52 PM    Additional Follow-up for Phone Call Additional follow up Details #2::     is okay, continue same medicines. Does need a BMP Jose E. Paz MD  May 04, 2010 4:04 PM   Additional Follow-up for Phone Call Additional follow up Details #3:: Details for Additional Follow-up Action Taken: pt stated he will have his labs done on the 5th... He is scheduled for a CPX with Dr.Paz at 9am Additional Follow-up by: Almeta Monas CMA Duncan Dull),  May 04, 2010 4:12 PM

## 2010-06-14 NOTE — Assessment & Plan Note (Signed)
Summary: CPX--YEARLY//PH   Vital Signs:  Patient profile:   77 year old male Weight:      187.13 pounds Pulse rate:   80 / minute Pulse rhythm:   regular BP sitting:   132 / 84  (left arm) Cuff size:   regular  Vitals Entered By: Army Fossa CMA (May 17, 2010 9:07 AM) CC: CPX, not fasting- no complaints  Comments sams w [facility] discuss shingles vaccine    History of Present Illness: Here for Medicare AWV:  1.   Risk factors based on Past M, S, F history: reviewed 2.   Physical Activities: walks in treadmil sometimes, not very active  3.   Depression/mood: denies problems, no problems noted  4.   Hearing: slightly  decreased, no problems noted w/ normal conversation  5.   ADL's: totally independent  6.   Fall Risk: low risk, no recent falls  7.   Home Safety: does feel safe at home  8.   Height, weight, &visual acuity: see VS, vision not well corrrected w/ current glasses, rec to                     see his eye doctor (he is due) 9.   Counseling: yes, see below 10.   Labs ordered based on risk factors: yes 11.           Referral Coordination, if needed 12.           Care Plan: See assessment and plan 13.            Cognitive Assessment: motor skills, memory and cognition seem appropiate   in addition, we discussed the following  chart reviewed, AAA,  u/s   11-11 stable, next 2013 iron deficiency anemia:corrected per last labs , iron was d/c  DIABETES -- on diet only, no ambulatory CBGs  HTN --was Rx lasix d/t hyperkalemoia, good medication compliance, no s/e   CAD-- asx , denies CP-SOB  HYPOTHYROIDISM -- good medication compliance  BPH-- reports aprocedure (?TURP) remotely in H.P.    Preventive Screening-Counseling & Management  Alcohol-Tobacco     Alcohol drinks/day: <1     Alcohol type: wine   Caffeine-Diet-Exercise     Does Patient Exercise: yes     Type of exercise: walking   Current Medications (verified): 1)  Fenofibrate Micronized 134 Mg Caps  (Fenofibrate Micronized) .[name].. 1 By Mouth Once Daily 2)  Pravastatin Sodium 40 Mg  Tabs (Pravastatin Sodium) .... 2 By Mouth Qd 3)  Atenolol 50 Mg Tabs (Atenolol) .... 2 By Mouth Once Daily 4)  Amlodipine Besylate 5 Mg Tabs (Amlodipine Besylate) .... 1/2 By Mouth Once Daily 5)  Metformin Hcl 850 Mg Tabs (Metformin Hcl) .[name].. 1 By Mouth Two Times A Day 6)  Levothyroxine Sodium 75 Mcg Tabs (Levothyroxine Sodium) .[name].. 1 By Mouth Once Daily 7)  Bayer Low Strength   Tbec (Aspirin Tbec) .... Take 1 Tablet By Mouth Once A Day 8)  Coumadin 3 Mg Tabs (Warfarin Sodium) .... As Directed 9)  Omeprazole 40 Mg  Cpdr (Omeprazole) .[name].. 1 Each Day 30 Minutes Before Meal 10)  Nitroglycerin 0.4 Mg Subl (Nitroglycerin) .... One Tablet Under Tongue Every 5 Minutes As Needed For Chest Pain---May Repeat Times Three 11)  Alprazolam 0.25 Mg Tabs (Alprazolam) .... 1/2 -1 Tablet 1 Hour Before Flying 12)  Ergocalciferol 50000 Unit Caps (Ergocalciferol) .[name].. 1 By Mouth Weekly For 3 Mos 13)  Apatate 25 Mg/61ml Liqd (Vitamins B1  B6 B12) .[name].. 1 Every Month 14)  Calcium 600 1500 Mg Tabs (Calcium Carbonate) .[name].. 1 By Mouth Daily 15)  Lasix 20 Mg Tabs (Furosemide) .[name].. 1 By Mouth Daily.  Allergies (verified): 1)  * Ace Inhibitors Group 2)  Maxzide (Triamterene-Hctz) 3)  Codeine Phosphate (Codeine Phosphate)  Past History:  Past Medical History: iron deficiency anemia: 12-2008:colonoscopy diverticuli, EGD hiatal hernia DIABETES MELLITUS, TYPE II  CAD HTN HYPERLIPIDEMIA  ATRIAL FIBRILLATION  --on coumadin AAA, next u/s due 03-2012 HYPOTHYROIDISM  BPH-- reports aprocedure (?TURP) remotely in H.P.. No further f/u w/ urology  GERD   INSOMNIA, TRANSIENT  VITAMIN B12 DEFICIENCY  Carotid U/S 1-09: 0 to 39%   Osteopenia  Past Surgical History: Reviewed history from 06/07/2009 and no changes required. PACEMAKER, PERMANENT (ICD-V45.01) [phone] Medtronic Minix 8341 CHOLECYSTECTOMY, LAPAROSCOPIC, HX OF  PROSTATECTOMY,  TRANSURETHRAL, HX OF   INGUINAL HERNIORRHAPHIES, BILATERAL, HX OF  HIP REPLACEMENT, RIGHT, HX OF  CORONARY ARTERY BYPASS GRAFT, HX OF (ICD-V45.81), 1999 stents in 2000 PENILE PROSTHESIS  1992  Family History: prostate ca--no Colon Cance--: no DM-- + several siblings   Mother died of Alzheimer's at age 19.   Father died of  complications of CHF with a history of CVA at age 86.   He had a brother  who died who he thinks maybe had heart disease.   He had two sisters who  died, one with lung problems, one with Alzheimer's, and then another  brother who is alive who has a history of CAD and CABG.      Social History: lives by self , independent on ADL retired separeted from wife 5 daughters, 82 GK, 2 GGK daughter [name] lives in same neiborhood Patient has never smoked.  Alcohol Use - yes-rare Daily Caffeine Use-2 cups daily Illicit Drug Use - no Patient does not get regular exercise.  diet-- imprived lately    Does Patient Exercise:  yes  Review of Systems General:  Denies chills, fatigue, and fever. CV:  Denies palpitations and swelling of feet. Resp:  Denies cough and shortness of breath. GI:  Denies bloody stools, nausea, and vomiting. GU:  Denies dysuria, hematuria, urinary frequency, and urinary hesitancy.  Physical Exam  General:  alert and well-developed.  alert and well-developed.   Neck:  no masses and normal carotid upstroke.   Lungs:  normal respiratory effort, no intercostal retractions, no accessory muscle use, and normal breath sounds.   Heart:  irregular rhythm, and no murmur.   Abdomen:  soft, non-tender, no distention, no masses, no guarding, and no rigidity.  soft, non-tender, no distention, no masses, no guarding, and no rigidity.   Rectal:  external hemorrhoids noted. Normal sphincter tone. No rectal masses or tenderness. Prostate:  Prostate gland firm and smooth, no enlargement, nodularity, tenderness, mass, asymmetry or induration. Extremities:  no  pretibial edema bilaterally  Psych:  Oriented X3, memory intact for recent and remote, normally interactive, good eye contact, not anxious appearing, and not depressed appearing.  Oriented X3, memory intact for recent and remote, normally interactive, good eye contact, not anxious appearing, and not depressed appearing.     Impression & Recommendations:  Problem # 1:  HEALTH SCREENING (ICD-V70.0) Td 2009 pneumonia shot 11-08 had a flu shot  shingles shot : benefits discussed, prescription provided  Cscope 2004, diverticuli; repeated colonoscopy 8- 2010   showed diverticuli  encourage more exercise, information regarding healthy diet provided  Orders: Medicare -1st Annual Wellness Visit [phone])  Problem # 2:  OSTEOPENIA (  ICD-733.90) DEXA:12-08--hip osteopenia  history of a low vitamin D 1-11. Was prescribed ergocalciferol plan Continue over-the-counter calcium, vitamin D DEXA vitamin D levels   The following medications were removed from the medication list:    Ergocalciferol 50000 Unit Caps (Ergocalciferol) .[name]... 1 by mouth weekly for 3 mos His updated medication list for this problem includes:    Calcium 600 1500 Mg Tabs (Calcium carbonate) .[name]... 1 by mouth daily  Orders: Radiology Referral (Radiology)  Problem # 3:  HYPERTENSION (ICD-401.9) recently started Lasix d/t  hyperkalemia. Labs His updated medication list for this problem includes:    Atenolol 50 Mg Tabs (Atenolol) .[name]... 2 by mouth once daily    Lasix 20 Mg Tabs (Furosemide) .[name]... 1 by mouth daily.    Amlodipine Besylate 5 Mg Tabs (Amlodipine besylate) .[name]... 1/2 by mouth once daily  BP today: 132/84 Prior BP: 90/56 (05/02/2010)  Labs Reviewed: K+: 5.8 (04/13/2010) Creat: : 1.5 (04/13/2010)   Chol: 144 (06/14/2008)   HDL: 26.1 (06/14/2008)   LDL: 86 (06/14/2008)   TG: 160 (06/14/2008)  Problem # 4:  DIABETES MELLITUS, TYPE II (ICD-250.00) prescription for a glucometer provided. not well controlled per last  hemoglobin A1c. Working on diet and exercise. Reassess in 3 months His updated medication list for this problem includes:    Metformin Hcl 850 Mg Tabs (Metformin hcl) .[name]... 1 by mouth two times a day    Bayer Low Strength Tbec (Aspirin tbec) .[name]... Take 1 tablet by mouth once a day  Labs Reviewed: Creat: 1.5 (04/13/2010)    Reviewed HgBA1c results: 7.3 (04/13/2010)  7.5 (06/07/2009)  Problem # 5:  HYPERLIPIDEMIA (ICD-272.4) labs  His updated medication list for this problem includes:    Fenofibrate Micronized 134 Mg Caps (Fenofibrate micronized) .[name]... 1 by mouth once daily    Pravastatin Sodium 40 Mg Tabs (Pravastatin sodium) .[name]... 2 by mouth qd  Labs Reviewed: SGOT: 22 (04/13/2010)   SGPT: 20 (04/13/2010)   HDL:26.1 (06/14/2008), 26.7 (03/16/2008)  LDL:86 (06/14/2008), 51 (03/16/2008)  Chol:144 (06/14/2008), 115 (03/16/2008)  Trig:160 (06/14/2008), 189 (03/16/2008)  Problem # 6:  ATRIAL FIBRILLATION (ICD-427.31)  on Coumadin INR today 2.9. See instructions His updated medication list for this problem includes:    Atenolol 50 Mg Tabs (Atenolol) .[name]... 2 by mouth once daily    Amlodipine Besylate 5 Mg Tabs (Amlodipine besylate) .[name]... 1/2 by mouth once daily    Bayer Low Strength Tbec (Aspirin tbec) .[name]... Take 1 tablet by mouth once a day    Coumadin 3 Mg Tabs (Warfarin sodium) .[name]... As directed  Orders: Protime (16109UE)  Problem # 7:  HYPERTROPHY PROSTATE W/O UR OBST & OTH LUTS (ICD-600.00) check a PSA  Problem # 8:  HYPOTHYROIDISM (ICD-244.9) reportedly taking "all medicines as prescribed"  labs His updated medication list for this problem includes:    Levothyroxine Sodium 75 Mcg Tabs (Levothyroxine sodium) .[name]... 1 by mouth once daily  Labs Reviewed: TSH: 3.13 (02/14/2009)    HgBA1c: 7.3 (04/13/2010) Chol: 144 (06/14/2008)   HDL: 26.1 (06/14/2008)   LDL: 86 (06/14/2008)   TG: 160 (06/14/2008)  Complete Medication List: 1)  Fenofibrate Micronized 134 Mg Caps (Fenofibrate  micronized) .[name].. 1 by mouth once daily 2)  Pravastatin Sodium 40 Mg Tabs (Pravastatin sodium) .... 2 by mouth qd 3)  Atenolol 50 Mg Tabs (Atenolol) .... 2 by mouth once daily 4)  Lasix 20 Mg Tabs (Furosemide) .[name].. 1 by mouth daily. 5)  Amlodipine Besylate 5 Mg Tabs (Amlodipine besylate) .... 1/2 by  mouth once daily 6)  Metformin Hcl 850 Mg Tabs (Metformin hcl) .[name].. 1 by mouth two times a day 7)  Levothyroxine Sodium 75 Mcg Tabs (Levothyroxine sodium) .[name].. 1 by mouth once daily 8)  Bayer Low Strength Tbec (Aspirin tbec) .... Take 1 tablet by mouth once a day 9)  Coumadin 3 Mg Tabs (Warfarin sodium) .... As directed 10)  Omeprazole 40 Mg Cpdr (Omeprazole) .[name].. 1 each day 30 minutes before meal 11)  Nitroglycerin 0.4 Mg Subl (Nitroglycerin) .... One tablet under tongue every 5 minutes as needed for chest pain---may repeat times three 12)  Alprazolam 0.25 Mg Tabs (Alprazolam) .... 1/2 -1 tablet 1 hour before flying 13)  Apatate 25 Mg/30ml Liqd (Vitamins b1 b6 b12) .[name].. 1 every month 14)  Calcium 600 1500 Mg Tabs (Calcium carbonate) .[name].. 1 by mouth daily 15)  Onetouch Basic System W/device Kit (Blood glucose monitoring suppl) .... Check once daily. dx. 250.00 16)  Onetouch Ultrasoft Lancets Misc (Lancets) .... Test once daily. 17)  Onetouch Test Strp (Glucose blood) .... Test once daily. 18)  Zostavax 04540 Unt/0.17ml Solr (Zoster vaccine live) .... Inject subcutaneously- 1 dose.  Patient Instructions: 1)  same Coumadin dose 2)  INR recheck in 4 weeks 3)  please come back fasting for labs as soon as possible: 4)  FLP---dx hyperlipidemia 5)  BMP ----dx hypertension 6)  PSA ---- dx BPH 7)  Vitamin D ---- dx osteopenia 8)  TSH ---- dx hypothyroidism 9)  Please schedule a follow-up appointment in 3 months .  Prescriptions: ONETOUCH BASIC SYSTEM W/DEVICE KIT (BLOOD GLUCOSE MONITORING SUPPL) check once daily. dx. 250.00  #1 x 0   Entered by:   Army Fossa CMA   Authorized by:   Nolon Rod. Laurielle Selmon  MD   Signed by:   Army Fossa CMA on 05/17/2010   Method used:   Print then Give to Patient   RxID:   [id number] ZOSTAVAX 08657 UNT/0.65ML SOLR (ZOSTER VACCINE LIVE) Inject Subcutaneously- 1 dose.  #1 x 0   Entered by:   Army Fossa CMA   Authorized by:   Nolon Rod. Roda Lauture MD   Signed by:   Army Fossa CMA on 05/17/2010   Method used:   Print then Give to Patient   RxID:   [id number] [facility] TEST  STRP (GLUCOSE BLOOD) Test once daily.  #3 month x 3   Entered by:   Army Fossa CMA   Authorized by:   Nolon Rod. Dalisa Forrer MD   Signed by:   Army Fossa CMA on 05/17/2010   Method used:   Print then Give to Patient   RxID:   [id number] ONETOUCH ULTRASOFT LANCETS  MISC (LANCETS) Test once daily.  #3 month x 3   Entered by:   Army Fossa CMA   Authorized by:   Nolon Rod. Tanaka Gillen MD   Signed by:   Army Fossa CMA on 05/17/2010   Method used:   Print then Give to Patient   RxID:   [id number] [facility] BASIC SYSTEM W/DEVICE KIT (BLOOD GLUCOSE MONITORING SUPPL) check once daily.  #1 x 0   Entered by:   Army Fossa CMA   Authorized by:   Nolon Rod. Katherina Wimer MD   Signed by:   Army Fossa CMA on 05/17/2010   Method used:   Print then Give to Patient   RxID:   [id number]    Orders Added: 1)  Medicare -1st Annual Wellness Visit [G0438] 2)  Est. Patient Level III [09323] 3)  Radiology Referral [Radiology] 4)  Protime [16109UE]    Laboratory Results   Blood Tests      INR: 2.9   (Normal Range: 0.88-1.12   Therap INR: 2.0-3.5) Comments: 3 mg tabs  takes 1 tab daily except Wed, Fri takes 1 1/2 tab       Risk Factors:     Type:  wine     Drinks per day:  <1 Exercise:  yes    Type:  walking       Appended Document: CPX--YEARLY//PH last bone density test 2-11 : dx Osteopenia, left hip.

## 2010-08-18 LAB — GLUCOSE, CAPILLARY: Glucose-Capillary: 110 mg/dL — ABNORMAL HIGH (ref 70–99)

## 2010-09-10 ENCOUNTER — Other Ambulatory Visit: Payer: Self-pay | Admitting: Cardiology

## 2010-09-25 NOTE — Assessment & Plan Note (Signed)
[facility]                            CARDIOLOGY OFFICE NOTE   NAME:Keech, KEALII THUESON                          MRN:          [id number]  DATE:10/29/2007                            DOB:          14-Oct-1925    [name] is in for followup.  He is stable.  He has not been having any  ongoing chest pain or shortness of breath.  He feels much better since  coming off the diltiazem.  He feels even better since reducing his  atenolol dose.   CURRENT MEDICATIONS:  1. Prilosec 20 mg daily.  2. Aspirin 81 mg daily.  3. Zetia 10 mg daily.  4. Prevacid 40 mg nightly.  5. Levothyroxine 25 mcg daily.  6. Coumadin as directed.  7. Atenolol 25 mg b.i.d. given as 50 mg 1/2 tablet b.i.d.  8. Amlodipine 5 mg 1/2 tablet daily.  9. Fenofibrate 54 mg daily.   On physical, blood pressure 110/64, the pulse is 62.  The lung fields  are clear.  The cardiac rhythm is regular.  There is no extremity edema.   Overall, Mr. Zeiss has clinically improved.  I am pleased with his  progress.  Given his overall situation, we will continue to recommend  medical therapy.  I will see him back in followup in approximately 4  months.  Should he have any problems in the interim, he is to contact  us.     Arturo Morton. [name], MD, [facility]  Electronically Signed    TDS/MedQ  DD: 10/29/2007  DT: 10/30/2007  Job #: [id number]

## 2010-09-25 NOTE — Assessment & Plan Note (Signed)
[facility]                            CARDIOLOGY OFFICE NOTE   NAME:Stamps, HOGAN HOOBLER                          MRN:          [id number]  DATE:03/07/2008                            DOB:          1925/09/12    Mr. Bellerose is in for followup.  He is stable.  He has not been having  chest pain.  He actually feels quite a bit better than he has recently.   MEDICATIONS:  1. Prilosec 20 mg daily.  2. Aspirin 81 mg daily.  3. Zetia 10 mg daily.  4. Pravastatin 40 mg nightly.  5. Levothyroxine 25 mcg daily.  6. Coumadin.  7. Amlodipine 5 mg one-half tablet daily.  8. Fenofibrate 54 mg daily.  9. Atenolol 50 mg one-half tablet b.i.d.  10.Metformin 500 mg b.i.d.   PHYSICAL EXAMINATION:  Blood pressure is 122/78 and pulse is 81.  The  lungs fields are clear.  The cardiac rhythm is irregularly irregular.  Extremities do not reveal significant edema.   The EKG reveals atrial fibrillation with right bundle-branch block and  nonspecific ST-T abnormalities.  T-wave changes are similar what have  been previously observed in May without a magnet.   IMPRESSION:  1. Coronary artery disease with prior bypass and stenting.  2. Hypertension, under good control.  3. Hypercholesterolemia.  4. Chronic atrial fibrillation with controlled ventricular response.   PLAN:  1. Return to clinic in 6 months.  2. Continue current medical regimen.     Arturo Morton. [name], MD, [facility]  Electronically Signed    TDS/MedQ  DD: 03/07/2008  DT: 03/08/2008  Job #: [id number]

## 2010-09-25 NOTE — H&P (Signed)
NAMEFRANKE, MENTER                   ACCOUNT NO.:  [id number]   MEDICAL RECORD NO.:  [id number]          PATIENT TYPE:  INP   LOCATION:  4741                         FACILITY:  [facility]   PHYSICIAN:  Pricilla Riffle, MD, FACCDATE OF BIRTH:  Sep 08, 1925   DATE OF ADMISSION:  01/08/2007  DATE OF DISCHARGE:                              HISTORY & PHYSICAL   PRIMARY CARDIOLOGIST:  Dr. Shawnie Pons   PRIMARY CARE Harris Kistler:  Dr. [name]   PATIENT PROFILE:  A 77 year old Caucasian male with prior history of CAD  and CABG who presents with new-onset atrial fibrillation with RVR.   PROBLEMS:  1. Atrial fibrillation with RVR.  2. CAD.      a.     Status post CABG x4 in 1999:  LIMA to the LAD, vein graft to       the diagonal, vein graft to the PA, vein graft to the obtuse       marginal      b.     In 2000, status post PCI of the native left circumflex and       ostial diagonal.  The vein graft to the OM was noted to be       occluded at that time.      c.     January 31, 2006, cardiac catheterization:  Three of four       patent grafts with known occlusion of the vein graft to the OM.       Otherwise, native multivessel disease.  Medical therapy.  EF was       preserved.  3. Hyperlipidemia.  4. Hypertension.  5. GERD/Barrett's esophagus.  6. Diverticulosis.  7. Osteoarthritis status post right total hip arthroplasty in      September 2004.  8. Cholelithiasis status post lap cholecystectomy.  9. History of pancreatitis in August 2004.  10.BPH.  11.History of small abdominal aortic aneurysm measured 2.4 cm by      ultrasound January 2001.  12.Status post Medtronic Minix 8341 single-lead permanent pacemaker in      1995.   HISTORY OF PRESENT ILLNESS:  A 77 year old Caucasian male with history  of CAD status post CABG x4.  He was in his usual state of health until  this morning at 10 a.m. when he noted his head feeling weird with  pressure.  This became worse after lunch and he checked  his radial pulse  and noted it to be erratic and rapid.  He talked to his daughter who  advised that he call EMS, and after arrival of EMS he was noted be in  atrial fibrillation with RVR.  He thinks they said he was in the 140w  rate-wise.  He was taken to the [facility] ED where here his rates have  been in the 80s to 90s.  I do not see that he has been given anything  for this and he denies having been given anything for his atrial  fibrillation.  He is now asymptomatic.  He denied any chest pain,  shortness of breath, palpitations,  lightheadedness, dizziness or syncope  at any time this morning.   His daughters do report that they have noticed him being a little bit  more sluggish and more easily fatigued over the past couple of months.   ALLERGIES:  CODEINE.   HOME MEDICATIONS:  1. Pravachol 80 mg q.h.s.  2. Felodipine 5 mg daily.  3. Atenolol 25 mg daily.  4. Prilosec 40 mg daily.  5. Zetia 10 mg daily.  6. Aspirin 81 mg daily.   FAMILY HISTORY:  Mother died of Alzheimer's at age 65.  Father died of  complications of CHF with a history of CVA at age 57.  He had a brother  who died who he thinks maybe had heart disease.  He had two sisters who  died, one with lung problems, one with Alzheimer's, and then another  brother who is alive who has a history of CAD and CABG.   SOCIAL HISTORY:  He lives in [place] by himself.  He is divorced.  He  is retired.  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  He also was delivering newspapers up until about 2 months ago.  He  denies any tobacco abuse or drug abuse.  He does not routinely exercise.  He does usually have one drink per night.  He drinks two cups of coffee  per day.   REVIEW OF SYSTEMS:  Positive for headache and pressure in his head  earlier this morning.  His daughters report easy fatigability and  sluggishness over the past couple of months.  Otherwise, all systems  reviewed and negative.   PHYSICAL  EXAMINATION:  Temperature 97.6, heart rate 75, respirations 20,  blood pressure 148/82, pulse oximetry 98% on room air.  Pleasant white  male in no acute distress.  Awake, alert and oriented x3.  HEENT:  Normal.  NEUROLOGIC:  Grossly intact and nonfocal.  NECK:  No bruits or JVD.  LUNGS:  Respirations regular and unlabored, clear to auscultation.  CARDIAC:  Irregularly irregular S1, S2.  There is no S3, S4, or murmurs.  ABDOMEN:  Round, soft, nontender, nondistended.  Bowel sounds present  x4.  EXTREMITIES:  Warm, dry and pink.  No clubbing, cyanosis or edema.  Dorsalis pedis and posterior tibial pulses 2+ and equal bilaterally.   ACCESSORY CLINICAL FINDINGS:  Chest x-ray shows borderline cardiomegaly  without CHF.  There is a question of a right lower lobe pulmonary nodule  with the recommendation for PA and lateral chest x-ray.  Low lung  volumes.  EKG:  Atrial fibrillation with a rate 93 and a normal axis.  There is a  right bundle-branch block with chronic ST depression, T-wave  inversion in leads III and aVF as well as V1 through V5.   LABORATORY WORK:  Hemoglobin 14.1, hematocrit 40.8, WBC 9.0, platelets  188.  Sodium 134, potassium 4.4, chloride 101, CO2 22.8, BUN 18,  creatinine 1.0, glucose 185.  CK-MB less than 0.0, troponin-I less than  0.05.   ASSESSMENT:  1. Atrial fibrillation with rapid ventricular response.  He is rate      controlled now in the 70s to 90s without intervention.  He ran out      of his atenolol 2 days ago and we will plan to resume this at      atenolol 25 mg b.i.d. with the first dose now.  We will add heparin      and Coumadin.  He has a [place] score of 2 with a  history of      hypertension and age.  Will check a 2-D echocardiogram as well as      TSH and magnesium.  His electrolytes are otherwise okay.  The      current plan is for rate control with TEE and cardioversion in 4      weeks if he does not convert prior to that time.  2. Coronary artery  disease.  He is pain free.  Check cardiac markers.      Continue aspirin, statin, beta blocker.  3. Hypertension.  Pressure slightly elevated currently.  Resume      atenolol and titrate.  Continue calcium channel blocker.  4. Hyperlipidemia.  Check lipids and LFTs, continue statin.  5. Gastroesophageal reflux disease.  Continue PPI.  6. Abnormal chest x-ray.  Follow up PA and lateral as suggested by      radiology for evaluation of right lower lobe pulmonary nodule.  7. History of abdominal aortic aneurysm.  This was 2.4 in 2001.  This      can be followed up as an outpatient.  He has no abdominal bruits.      Nicolasa Ducking, ANP      Pricilla Riffle, MD, [facility]  Electronically Signed    CB/MEDQ  D:  01/08/2007  T:  01/09/2007  Job:  [id number]

## 2010-09-25 NOTE — Assessment & Plan Note (Signed)
[facility]                            CARDIOLOGY OFFICE NOTE   NAME:Yeomans, MARSHA HILLMAN                          MRN:          [id number]  DATE:09/15/2007                            DOB:          10-12-1925    Mr. Korb is in for follow-up.  In general, he has been stable.  However,  he just does not feel like he is energetic.  He apparently has had some  laboratory studies done with Dr. Drue Novel.  He has seen him recently.  He  wonders whether the atenolol could be causing a problem.  He is  interested in trying to find a substitute and/or cutting back.   His medications include:  1. Prilosec over-the-counter 20 mg daily.  2. Aspirin 81 mg daily.  3. Zetia 10 mg daily.  4. Pravastatin 40 mg every night.  5. Levothyroxine 25 mEq daily.  6. Atenolol 50 mg b.i.d.  7. Coumadin as directed.   On physical, he is alert.  Blood pressure is 134/90, pulse 72.  The lung fields are actually quite clear.  EXTREMITIES:  Do not reveal significant edema.  CARDIAC:  Rhythm is regular.   The underlying rhythm is irregularly irregular with atrial fibrillation  and right bundle branch block.   His recent laboratory studies include an LDL of 98, TSH of 3.3.  Hemoglobin A1c was 7.6.   Of note, January revealed a hemoglobin of 13.4, hematocrit at 40.   Also of note, in 1997, he had no significant aortic valve gradient.   The exact etiology of symptoms are unclear.  He and I had a thorough  discussion about this.  He will decrease his atenolol down to 25 mg p.o.  b.i.d.  We will substitute amlodipine 5 mg 1/2 tablet daily to try to  make sure his blood pressure is modified.  I explained to him that there  could be some swelling in the feet.  I have also explained to him that  his heart rate could go up as we back off on the atenolol.  We will see  him back in 4 weeks and try to reassess his status.     Arturo Morton. [name], MD, [facility]  Electronically Signed    TDS/MedQ   DD: 09/15/2007  DT: 09/15/2007  Job #: [id number]

## 2010-09-25 NOTE — Assessment & Plan Note (Signed)
[facility]                            CARDIOLOGY OFFICE NOTE   NAME:[name], [name]                          MRN:          [id number]  DATE:03/13/2007                            DOB:          March 11, 1926    [name] is in for a followup visit.  In general, he has been reasonably  well.  He does have a fair amount of fatigue.  He has not had followup  with Dr. Posey Rea yet.  Importantly, the patient is now on Coumadin  anticoagulation.  He is in atrial fibrillation.  His INRs have been  checked on a regular basis, and he is due today.  He denies any ongoing  chest pain.  He has also not had his thyroid or lipids checked recently.   EXAMINATION:  Today on examination, the blood pressure is 122/72.  The  pulse is 65.  LUNG FIELDS:  Clear.  CARDIAC:  Rhythm is regular.  It is occasionally irregular.   IMPRESSION:  1. Coronary artery disease with prior myocardial infarction on beta      blockade and antiplatelet therapy.  2. Hypercholesterolemia on lipid lowering therapy.  3. Hypothyroidism on Synthroid.  4. Atrial fibrillation with backup ventricular packing for heart      block.   DISPOSITION:  The patient will remain on anticoagulation.  We will him  back in followup in a couple of months.  Some of the fatigue could  potentially be related to the atrial fibrillation, and we may give some  consideration to cardioversion.  I will see him back in 2 months, and we  will try to reassess that.     Arturo Morton. [name], MD, [facility]  Electronically Signed    TDS/MedQ  DD: 03/13/2007  DT: 03/13/2007  Job #: [id number]   cc:   Georgina Quint. Plotnikov, MD

## 2010-09-25 NOTE — Assessment & Plan Note (Signed)
[facility]                            CARDIOLOGY OFFICE NOTE   NAME:[name], [name]                          MRN:          [id number]  DATE:07/14/2007                            DOB:          06-26-1925    [name] is in for followup.  Specifically, he is markedly improved.  He  thinks that the discontinuation of the Diltiazem CD has resulted in a  dramatic improvement.  This was initiated when he came in with atrial  fib with a rapid ventricular response.  To this regard, Dr. Drue Novel has  appropriately taken the step of increasing his atenolol to 50 mg p.o.  b.i.d., and specifically to block his heart rate should he develop rapid  rate.  The patient does note that when he first came in, he had withheld  his atenolol previously.  He now is feeling quite a bit better, and many  of his symptoms that he had specifically resolved.  Recent laboratory  studies have included a hemoglobin of 13.3 and potassium of 4.8.  Recent  spine films revealed no thoracic spine acute fracture or subluxation,  but mild degenerative changes in the lower thoracic spine.  CT of the  chest 2-view revealed no active cardiopulmonary disease and dual lead  cardiac pacemaker.  Specific spine films did reveal some mild narrowing  of the left neural foramen at C4-5.  There is mild narrowing of the  right neural foramen at C3-4 and C4-5.  C1-2 relationship was  unremarkable.  He recently had a CT angiographic study of the neck with  significant tortuosity including a full 180 degree loop of the left  internal carotid proximal to entering the base of skull with some  calcification.  There is a fetal-type right posterior cerebral  circulation.   CURRENT MEDICATIONS:  1. Prilosec 20 mg daily.  2. Aspirin 81 mg daily.  3. Zetia 10 mg daily.  4. Pravastatin 40 mg nightly.  5. Levothyroxine 25 mcg daily.  6. Atenolol 50 mg b.i.d.  7. Coumadin as directed.   PHYSICAL:  He is alert and  oriented in no distress.  Blood pressure 126/80, pulse is 60.  The lung fields are clear.  The cardiac rhythm is regular.   I am quite pleased that he is so markedly improved.  He will continue on  his current medical regimen.  I discussed with him the rationale for  taking dual antiplatelet therapy.  Specifically, I do think that the  atenolol should cover him nicely, but I explained to him that there  could be some side effects related to this as well.  We will see him  back in followup in the near future.    Arturo Morton. [name], MD, [facility]  Electronically Signed   TDS/MedQ  DD: 07/15/2007  DT: 07/15/2007  Job #: [id number]   cc:   [name], MD

## 2010-09-25 NOTE — Assessment & Plan Note (Signed)
[facility]                         ELECTROPHYSIOLOGY OFFICE NOTE   NAME:Perno, VOYD GROFT                          MRN:          [id number]  DATE:08/04/2007                            DOB:          04/28/1926    Mr. Mccambridge returns for followup.  He is a very pleasant 77 year old man  with a history of symptomatic bradycardia status post pacemaker  insertion.  He has a history of chronic A fib.  He returns today for  followup.  He had no specific complaints today.   MEDICATIONS:  His medications included:  1. Prilosec 20 mg a day.  2. Aspirin 81 a day.  3. Zetia 10 a day.  4. Pravachol 40 a day.  5. Synthroid 25 mg daily.  6. Atenolol 50 mg twice daily and Coumadin as directed.   EXAMINATION:  GENERAL:  On exam, he is a pleasant elderly man in no  distress.  VITAL SIGNS:  Blood pressure was 130/80, the pulse 62 and regular,  respirations were 18.  The weight was 200 pounds.  NECK:  Revealed no jugular distention.  LUNGS:  Clear bilaterally to auscultation.  No wheezes, rales or rhonchi  are present.  CARDIOVASCULAR:  Irregular rhythm with normal S1 and S2.  EXTREMITIES:  Demonstrated no edema.  ABDOMEN:  Soft, nontender, nondistended.  There was no organomegaly.   Interrogation of his pacemaker demonstrates a Medtronic Minix with R-  waves of 12, the impedance 469 and the threshold 2.5 volts of 0.1  milliseconds.  The battery voltage was 2.70 volts.   IMPRESSION:  1. Symptomatic bradycardia.  2. Atrial fibrillation.  3. Status post pacemaker insertion.   DISCUSSION:  Overall, Mr. Dubeau is stable, and his pacemaker still  working satisfactorily.  He is approaching but not yet at the ER.  I  will see him back, continue our monthly pacemaker followups.     Doylene Canning. Ladona Ridgel, MD  Electronically Signed    GWT/MedQ  DD: 08/04/2007  DT: 08/05/2007  Job #: [id number]

## 2010-09-25 NOTE — Discharge Summary (Signed)
[name], [name]                   ACCOUNT NO.:  [id number]   MEDICAL RECORD NO.:  [id number]          PATIENT TYPE:  INP   LOCATION:  4741                         FACILITY:  [facility]   PHYSICIAN:  Learta Codding, MD,FACC DATE OF BIRTH:  [birth year]   DATE OF ADMISSION:  01/08/2007  DATE OF DISCHARGE:  01/10/2007                               DISCHARGE SUMMARY   CARDIOLOGIST:  Dr. Shawnie Frank.   PRIMARY CARE [name]:  Dr. Willow Frank.   REASON FOR ADMISSION:  Symptomatic atrial fibrillation with rapid  ventricular rate.   DISCHARGE DIAGNOSES:  1. Persistent atrial fibrillation with controlled ventricular rate.      a.     Coumadin therapy initiated this admission.      b.     CHADS = 2.  2. Coronary artery disease.      a.     Status post coronary artery bypass graft in 1999.      b.     Status post percutaneous coronary intervention of native       left circumflex.      c.     Catheterization in 2007, three of four grafts patent.  3. Hyperlipidemia.  4. Hypertension.  5. Small abdominal aortic aneurysm measuring 2.4 cm by ultrasound in      2001.  6. Status post permanent pacemaker implantation in 1995.  7. Elevated thyroid stimulating hormone at 7.76 this admission.  8. New-onset diabetes mellitus with hemoglobin A1c of 7.3.   HOSPITAL COURSE:  The patient is a 77 year old male patient followed by  Dr. Riley Frank with a history of coronary artery disease who developed some  head pressure on the date of admission.  He checks his pulse, it was  rapid and irregular, and he called [facility].  Apparently, he was in atrial fibrillation with a heart rate of 140, and  when he got to the emergency room his heart rate was in the 80s to 90s.  He had missed his atenolol for a few days.  He was placed back on  atenolol, and was admitted for heparin and Coumadin therapy.   The patient did well over the next couple of days.  He remained rate-  controlled.  His atenolol was  increased to 25 mg twice a day.  He ruled  out for myocardial infarction by enzymes.  He was evaluated on January 10, 2007 by Dr. Andee Frank, and it was felt that he could be discharged to  home without bridging Lovenox or heparin.  His [place] score was 2.  He  will need life-long Coumadin.  We will make sure that he has close  followup with the [facility] early next week, and follow up with  Dr. Riley Frank in the next several weeks.  We may want to consider  cardioversion in the next several weeks after he has had a therapeutic  INR.  He is felt to be stable enough for discharge to home.  He will  need followup with his primary care physician for his apparent new-onset  diabetes mellitus, as well  as his elevated TSH level.  He has been asked  to arrange that followup.   LABORATORY AND ANCILLARY DATA:  White count 5500, hemoglobin 13.3,  hematocrit 38.3, platelet count 169,000, INR 1.4 at discharge.  Sodium  138, potassium 4.5, glucose 156, BUN 15, creatinine 1.08.  Hemoglobin  A1c 7.3, which equates to a mean plasma glucose of 183.  BNP on  admission 112.  TSH 7.76, free T4 0.99, free T3 3.5.  Chest x-ray on  admission revealed possible right base pulmonary nodule.  Followup 2-  view of the chest revealed mild cardiomegaly, lingular scar or  atelectasis, no nodule seen in the area in question in the right lower  lung.   DISCHARGE MEDICATIONS:  1. Coumadin 5 mg daily.  2. Aspirin 81 mg daily.  3. Atenolol 50 mg 1/2 tablet b.i.d.  4. Pravachol 80 mg daily.  5. Prilosec 40 mg daily.  6. Zetia 10 mg daily.  7. Cardizem CD 120 mg daily.   DISCHARGE DIET:  Low fat, low sodium diet.   DISCHARGE ACTIVITIES:  He is to increase his activity slowly.   WOUND CARE:  Not applicable.   FOLLOW UP:  1. The office will contact him for a followup appointment with the      Coumadin Clinic on January 13, 2007.  2. The office will contact him for an appointment to follow up with      Dr. Riley Frank in  the next 2-3 weeks.  3. The patient should call Dr. Leta Frank office to arrange followup on his      thyroid, as well as his apparent newly diagnosed diabetes mellitus.   DISPOSITION:  Consider elective cardioversion in the next 3-4 weeks  after the patient's INR has been therapeutic.   Total physician PA time greater than 30 minutes on this discharge.      Nicholas Newcomer, PA-C      Learta Codding, MD,FACC  Electronically Signed    SW/MEDQ  D:  01/10/2007  T:  01/11/2007  Job:  [id number]   cc:   Nicholas Ora, MD

## 2010-09-25 NOTE — Assessment & Plan Note (Signed)
[facility]                            CARDIOLOGY OFFICE NOTE   NAME:Catalano, ALCIDES NUTTING                          MRN:          [id number]  DATE:06/02/2007                            DOB:          [birth year]    [name] is in for follow-up.  In general he feels about the same.  He  continues to have a low grade headache.  This occurs primarily at the  very back of the cranium.  It may or may not be related much to  movement.  He says when it hurts more severely he sometimes feels as  though he is going to pass out.  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  He had a pacemaker placed many  years ago for this.  The pacemaker really quite dramatically improved  many of the symptoms.  He says that he also has a funny feeling on the  very top of his head.  He notices a little bit of headaches at times.  Importantly, we also did a recent pacemaker follow-up.  The patient has  a Medtronic Minix [id number], and so basically what they were able to test was  battery voltage, and threshold, as well as impedance.  The implant was  in 1995, but appears to be working well.  We also did some laboratory  studies including hemoglobin/hematocrit to make sure that was stable,  and it was at 13.4 and 40.0, respectively.  The platelet count was  normal at 235,000.  BUN and creatinine were also normal and potassium  was normal.  Glucose was 107.  He tells me that his recent studies for  TSH were checked as well, although I do not have these available to me.   He questioned whether he could have hypoxemia, but we tested him in the  office today and he was 96% and 93% after exercising around the room.  In addition, carotid studies were done.  The carotid studies revealed  antegrade flow in both vertebrals.  There was not high-grade carotid  stenosis with 0-39% bilaterally with some plaque noted.  Dr. Drue Novel also  sent him for a CT scan.  This reveals  generalized atrophy with chronic  ischemic changes in the white matter bilaterally.  There is also some  chronic ischemia in the basal ganglia and internal capsule.  This was  negative for hemorrhage or mass and no acute infarct was identified.   EXAMINATION:  He is alert and oriented in no distress.  The blood pressure was 134/84.  The pulse was 61.  There was absolutely  no change with change in position.  The lung fields were clear to auscultation and percussion.  Cardiac exam revealed minimal systolic ejection murmur.  Abdomen was soft.  There is no significant extremity edema.   Recent echocardiogram revealed some increase in left ventricular wall  thickness.  There was mildly calcified aortic valve but otherwise  unremarkable.   His baseline EKG demonstrated ventricular pacing.   At the present, there does not appear to be  pacer malfunction.  Whether  or not he has suppression of his chronotropic response to exercise is  unclear.  This could account for some of the exertional symptoms.  He  has not have obvious bony tenderness over the cervical cranial  interface, and I am not sure whether any of these symptoms could be due  to either tension headaches and/or cervical spine disease.  Importantly,  his CT scan was unremarkable.  There are no orthostatic changes.  He  does have a history that suggests of vasovagal-type syncope that was  partially improved by pacing.  I am not going to change anything at the  present time, but I am going to have him return in the next couple of  days to get a brief exercise test to document that he in fact does not  have chronotropic incompetence for the source of his symptoms.  Beyond  that, I am not quite sure where to head with regard to the headaches,  and syncope.  I have encouraged him to remain hydrated as a maneuver to  counteract the vasodepressor portion of the potential syncope.  We will  continue to follow him closely to  resolution.     Arturo Morton. [name], MD, [facility]  Electronically Signed    TDS/MedQ  DD: 06/02/2007  DT: 06/02/2007  Job #: [id number]   cc:   [name], MD

## 2010-09-25 NOTE — Assessment & Plan Note (Signed)
[facility]                            CARDIOLOGY OFFICE NOTE   NAME:Peth, AMOGH KOMATSU                          MRN:          [id number]  DATE:02/05/2007                            DOB:          [birth year]    Mr. Leavell is in for followup.  He presented with atrial fibrillation and  was in rapid ventricular response.  His rate controlling drugs were  increased.  He has an underlying VVI pacer.  He was started on Coumadin  anticoagulation.  He has not had a lot of bleeding problems, but his INR  was high initially.  He is getting it rechecked today.  He does have  some weakness, although he has been generally inactive and not doing  much in the way of activity.   MEDICATIONS:  1. Atenolol 50 mg one half tablet p.o. b.i.d.  2. Prilosec over-the-counter.  3. Enteric coated aspirin 81 mg daily.  4. Zetia 10 mg daily.  5. Pravastatin 40 mg nightly.  6. Diltiazem CD 120 mg daily.  7. Coumadin as directed.  8. Levothyroxine 25 mcg daily.   PHYSICAL EXAMINATION:  VITAL SIGNS:  Blood pressure 130/80, pulse 63.  LUNGS:  Clear.  CARDIAC:  Irregularly, irregular today.   I plan to see him back in followup in about 4 weeks.  At that time, we  will recheck his overall status.  If he has any problems in the interim,  he is to call us.  We will then discuss whether or not to try and  reconvert him, although I have discussed all these options with him and  his daughter in detail.     Arturo Morton. [name], MD, [facility]  Electronically Signed    TDS/MedQ  DD: 02/05/2007  DT: 02/05/2007  Job #: [id number]

## 2010-09-25 NOTE — Assessment & Plan Note (Signed)
[facility]                            CARDIOLOGY OFFICE NOTE   NAME:Jarnagin, NYZIR DUBOIS                          MRN:          [id number]  DATE:05/19/2007                            DOB:          [birth year]    Mr. Chittick is in for a followup.  To briefly summarize, he has not felt  well the last couple of days.  He noticed that he was weak and very  tired.  He is feeling somewhat better today.  He said he almost felt as  though he was going to pass out.  Today on exam, the blood pressure was  136/84 and the pulse was 63.  With standing, there was absolutely no  change in his blood pressure.  His lung fields were clear to  auscultation and percussion.  His cardiac rhythm was regular.  The  abdomen was soft and I could not feel an abdominal mass.  Extremities  revealed no edema.   Electrocardiogram demonstrated baseline ventricular pacing without  obvious P waves.   IMPRESSION:  1. Coronary artery disease with prior myocardial infarction, on beta      blockade and antiplatelet therapy.  2. Hypercholesterolemia, on lipid-lowering therapy.  3. Hypothyroidism, on Synthroid replacement.  4. Atrial fibrillation with backup ventricular pacing and on Coumadin      anticoagulation.  5. Recent elevation in sugars, although the most recent hemoglobin A1c      was about 6.7.   PLAN:  We will go ahead and do some laboratory studies.  I will see the  patient back in followup in the next couple of weeks.  I have encouraged  him not to drive as he has not felt well.  We will check a glucose.  His  pacemaker was checked in the office today and does not show evidence of  end-of-life despite its 1995 implant date.     Arturo Morton. [name], MD, [facility]  Electronically Signed    TDS/MedQ  DD: 05/19/2007  DT: 05/20/2007  Job #: [id number]

## 2010-09-28 NOTE — Discharge Summary (Signed)
NAMEDECKARD, STUBER                             ACCOUNT NO.:  [id number]   MEDICAL RECORD NO.:  [id number]                   PATIENT TYPE:  INP   LOCATION:  2550                                 FACILITY:  [facility]   PHYSICIAN:  Thomas C. Wall, M.D.                DATE OF BIRTH:  1925/06/30   DATE OF ADMISSION:  02/09/2003  DATE OF DISCHARGE:                                 DISCHARGE SUMMARY   CONSULTING PHYSICIAN:  Maisie Fus C. Wall, M.D.   We were asked by Dr. Deri Fuelling service to see [name] for a drop in  his systolic pressure to 80 after having hip surgery today.   He is a 77 year old gentleman who was seen in the office on December 30, 2002,  for a pre-op clearance.  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  His last  catheterization demonstrated patent LIMA to the LAD, vein graft to a  diagonal that was patent, a 40% lesion at a previous PTCA site at the graft  insertion of the diagonal, left circumflex with three stents intact and  patent with a 50% between the second and third stents, saphenous vein graft  to the PDA that was patent, a saphenous vein to obtuse marginal II that was  patent and three chronically occluded vessels.  He had good left ventricular  systolic function.  He had a stress Cardiolite in 2003 which was negative  for ischemia.  It was felt that he did not need objective assessment prior  to surgery.  After surgery today, he dropped his pressure down to the 80s  and we were called to help manage.   We gave him a fluid bolus and his pressure is now 95-100 systolic and he has  no complaints.   Of note, last evening at home, prior to coming in for his surgery, they tell  me he had substernal chest pain and took two sublingual nitroglycerin with  relief.   PAST MEDICAL HISTORY:  1. Also significant for an abdominal aortic  aneurism of 2.6 x 2.7-cm.  2. He has a history of hyperlipidemia.  3. Gastroesophageal reflux.  4. Barrett's esophagitis.  5. Diverticulosis.  6. Osteoarthritis which required the right hip replacement today.   MEDICATIONS PRIOR TO ADMISSION:  1. Aspirin 325 a day.  2. Atenolol 50 mg a day.  3. Pravachol 40 mg a day.  4. Aciphex 20 mg a day.  5. Sublingual nitroglycerin p.r.n.  6. Tylenol p.r.n.   He is intolerant of CODEINE.   He lives in [place] and is married but now separated.  He does not smoke  or drink.  He does not use any drugs.   FAMILY HISTORY:  Positive for coronary disease and diabetes.   PHYSICAL EXAMINATION:  GENERAL:  Shows him to be in no acute distress.  He  is supine.  He is post-op.  He is sleepy but alert and oriented when awoken.  SKIN:  Warm and dry.  VITAL SIGNS:  His blood pressure is now 99/43, pulse is 63 and regular.  His  respiratory rate is 14.  O2 sat is 97% on 2 liters.  HEENT:  Unremarkable.  NECK:  Shows no JVD.  Carotid upstrokes are equal bilaterally without  bruits.  There is no thyromegaly.  There is no JVD.  CARDIAC:  Reveals a normal S1, S2 without gallop.  LUNGS:  Clear to auscultation percussion.  ABDOMEN:  Soft.  Good bowel sounds.  There is no hepatosplenomegaly.  EXTREMITIES:  Reveal no edema.  Pulses are present.  NEUROLOGIC:  Intact.   Chest x-ray, on January 13, 2003, showed no acute disease.  Status post  coronary bypass grafting.   His EKG shows a sinus rhythm with a ventricular paced rhythm.  Repeated  another EKG and he had a sinus rhythm with a first degree A-V block with  diffuse T wave changes inferolaterally.  He also has some anterior septal ST  segment changes as well.   ASSESSMENT:  Hypotension - postoperative, possibly consistent with ischemia  versus a vasovagal event.   1. We recommend hydration with a liter of fluid.  He has normal left     ventricular systolic function.  2. We check cardiac enzymes in  the form of troponin.  If these are negative     we would recommend a stress Cardiolite prior to discharge.  If his     enzymes are positive, we would proceed with cardiac catheterization.  3. Continue current medications including aspirin.   Thank you very much for the consultation.                                                Thomas C. Wall, M.D.    TCW/MEDQ  D:  02/09/2003  T:  02/09/2003  Job:  [id number]   cc:   Ollen Gross, M.D.  [address]  [place]  [place] [address]  Fax: [phone]   Arturo Morton. [name], M.D.   Angelena Sole, M.D. [facility]

## 2010-09-28 NOTE — Discharge Summary (Signed)
[facility]  Patient:    [name]                           MRN: [id number] Adm. Date:  [id number] Disc. Date: 05/22/99 Attending:  Mirian Mo Dictator:   Leonides Cave, P.A. CC:         Arturo Morton. [name], M.D. [facility]             Edwin L. Judie Grieve, M.D.                  Referring Physician Discharge [place]  DATE OF BIRTH:  [birth year].  DISCHARGE DIAGNOSIS:  Coronary artery disease, status post cardiac catheterization revealing patent grafts with a 50% stenosis between patients previous second and third stents.  BRIEF HISTORY:  Patient is a 77 year old white male who is well-known to Dr. Arturo Morton. [name], as he has had two prior admissions and catheterizations,  most recently in July of 2000.  Patient returns on May 18, 1999 with similar  midsternal chest pressure as before.  Patient states that this chest pain episode occurred around 3:45 on the day of admission [place] the [place].  He had  diaphoresis, though denies nausea, vomiting or radiation of pain.  Patient took  sublingual nitroglycerin without any relief.  EKG on admission revealed ventricular pacing at 60 beats per minute.  Patient was admitted to rule out for MI and was  started on low-molecular-weight heparin and set up for a cardiac catheterization.  HOSPITAL COURSE:  Patient was taken to the catheterization lab on May 21, 1999 by Dr. Bonnee Quin; please see a copy of his dictation for full details, however, according to the progress notes, it appears that all grafts were patent, however, Dr. [name] does note that there was a 50% stenosis between two prior stents that were placed.  After the films were reviewed and Dr. [name] spoke with Dr. Dorma Russell L. Judie Grieve, medical treatment was favored.  It was noticed that patients blood pressure was low when he was A-V paced, therefore, Norvasc was discontinued. Patient was therefore discharged home in stable  condition on May 22, 1999 on the following medications:  DISCHARGE MEDICATIONS: 1. Coated aspirin 325 mg q.d. 2. Atenolol 25 mg q.d. 3. Nitroglycerin p.r.n.  DISCHARGE INSTRUCTIONS:  He was told to stop his Norvasc.  ACTIVITIES:  He was instructed to undergo no heavy lifting, driving or sexual activity for two days.  DIET:  He was told to adhere to a low-fat, low-cholesterol diet.  WOUND CARE:  He was told that if bleeding or swelling occurred at the catheterization site, he should call [facility] office immediately.  FOLLOWUP:  He will need to follow up with Dr. Judie Grieve in two to three weeks; he will call for that appointment.  Dr. [name] felt that he might need to get his HDL cholesterol checked at that time.  He will follow up with Dr. [name] on a p.r.n. basis. DD:  05/22/99 TD:  05/22/99 Job: [id number]

## 2010-09-28 NOTE — Assessment & Plan Note (Signed)
[facility]                            CARDIOLOGY OFFICE NOTE   NAME:[name]                            MRN:          [id number]  DATE:03/31/2006                            DOB:          26-Aug-1925    [name] is in for followup.  He recently underwent cardiac  catheterization.  We have elected to treat him medically.  He saw Dr.  Drue Novel and was started on additional blood pressure medicine and this has  resulted in a significant improvement.  His blood pressure the last few  days has been 130 or less with a diastolic in the 70s.  He also had a  lipid profile done.  This lipid profile demonstrated excellent numbers.  His most recent cholesterol was 129 with a triglycerides of 113, HDL of  37, and an LDL of 69.  In addition, his liver function studies were  normal.  Finally, his CPK was normal.  His B12 level was subnormal and  he does have a slightly enlarged MCV on his most recent hemoglobin.  I  have asked him to make sure that he follows up with Dr. Drue Novel specifically  for this and the lab work was actually ordered by Dr. Drue Novel.   MEDICATIONS:  1. Atenolol 50 mg daily.  2. Pravachol 80 mg daily.  3. Prilosec over-the-counter one daily.  4. Enteric-coated aspirin 81 mg daily.  5. Zetia 10 mg daily.  6. Lisinopril 10 mg daily.   EXAMINATION:  He is an alert, oriented gentleman in no distress.  Blood  pressure is 132/80, the pulse is 70.  The lung fields are clear.  The  cardiac rhythm is regular without a significant murmur.   IMPRESSION:  1. Coronary artery disease status post coronary artery bypass graft      surgery - see last catheterization report.  2. Status post permanent pacer implantation with single-chamber VVI.  3. Hypercholesterolemia, on lipid-lowering therapy.  4. Small abdominal aortic aneurysm, under monitoring.  5. Hypertension, under good control.  6. Mildly low B12 level with MCV of 101.   DISPOSITION:  1. Return to clinic in  6 months.  2. Recommend scheduled followup with Dr. Drue Novel to review B12 data.    Arturo Morton. [name], MD, [facility]  Electronically Signed   TDS/MedQ  DD: 03/31/2006  DT: 03/31/2006  Job #: [id number]   cc:   [name], MD

## 2010-09-28 NOTE — Assessment & Plan Note (Signed)
[facility]                         ELECTROPHYSIOLOGY OFFICE NOTE   NAME:Dalporto, LAIRD RUNNION                          MRN:          [id number]  DATE:04/07/2006                            DOB:          1925-11-27    Mr. Mederos was seen in the device clinic today on April 07, 2006 for  routine followup of his Medtronic Minix model [id number] single-chamber  pacemaker implanted April of 1995.   Upon interrogation the battery voltage is 2.73 volts in the right  ventricle and trinsic amplitude is greater than 10 millivolts with a  lead impedance of 490 ohms and threshold of 2.5 volts at 0.2  milliseconds. No programming changes were made at this time. Mr. Greenman  declines transtelephonic monitoring so he will be seen in the office in  6 months' time.      Cleatrice Burke, RN  Electronically Signed      Doylene Canning. Ladona Ridgel, MD  Electronically Signed   CF/MedQ  DD: 04/07/2006  DT: 04/07/2006  Job #: [id number]

## 2010-09-28 NOTE — Discharge Summary (Signed)
[name], [name]                             ACCOUNT NO.:  [id number]   MEDICAL RECORD NO.:  [id number]                   PATIENT TYPE:  INP   LOCATION:  5006                                 FACILITY:  [facility]   PHYSICIAN:  [name], M.D.                 DATE OF BIRTH:  1926-04-22   DATE OF ADMISSION:  02/09/2003  DATE OF DISCHARGE:  02/14/2003                                 DISCHARGE SUMMARY   ADMITTING DIAGNOSES:  1. Osteoarthritis right hip.  2. Coronary arterial disease.  3. Mild hypercholesterolemia.  4. Hiatal hernia.  5. Gastroesophageal reflux disease.  6. Diverticulitis.  7. Benign prostatic hypertrophy status post partial prostatectomy.  8. Status post coronary artery bypass grafting six vessels.   DISCHARGE DIAGNOSES:  1. Osteoarthritis right hip status post right total hip arthroplasty.  2. Mild postoperative blood loss anemia.  3. Mild postoperative hyponatremia.  4. Postoperative hypotension, rule out cardiac event.  5. Coronary arterial disease.  6. Mild hypercholesterolemia.  7. Hiatal hernia.  8. Gastroesophageal reflux.  9. Diverticulitis.  10.      Benign prostatic hypertrophy status post partial prostatectomy.  11.      Status post coronary artery bypass grafting six vessels.   PROCEDURE:  The patient was taken to the OR on February 09, 2003.  Underwent a right total hip arthroplasty.  Surgeon [name], M.D.  Assistant [name], P.A.  Anesthesia general.  Blood loss 200  mL.  Hemovac drain x1.   CONSULTS:  Cardiology, [name], M.D.   BRIEF HISTORY:  The patient is a 77 year old male who has been seen by [name], M.D., referred over by [name], M.D. for painful right  hip.  He is seen and found to have end-stage arthritis with bone on bone  changes in the right hip which have nearly fused on that side.  He has also  noted collapse of the femoral head.  He is in intractable pain which has  been  refractory to conservative management.  It is felt he has reached a  point where he would benefit from undergoing hip replacement.  Risks and  benefits of procedure have been discussed with the patient and he has  elected to proceed with surgery.   LABORATORY DATA:  CBC on admission showed hemoglobin of 14.1, hematocrit of  40.3, white cell count of 6.6, red cell count of 3.98.  Differential:  Neutrophils 62, lymphs 23, elevated monos of 13, eos 2, basos 1.  Serial H&H  were followed.  Postoperative H&H 11.9 and 34.4.  Last noted H&H 9.3 and  26.4.  PT/PTT on admission were 13.7 and 29, respectively with an INR of  1.0.  Serial pro times followed.  Last noted PT/INR 21.3 and 2.4.  Chemistry  panel on admission all within normal limits.  Serial BMETs were followed.  Sodium  dropped from 138 down to 134.  Calcium dropped from 8.6 to 7.6.  Glucose went up from 106 to 159.  Cardiac enzymes taken on February 03, 2003:  Normal CK 153, normal CK-MB 2.5, normal relative index 1.6, normal  troponin 0.01.  Follow-up troponin on February 10, 2003 0.01.  Follow-up  second troponin on February 10, 2003 0.05.  Urinalysis negative.  Blood  group type A+.  Urine culture:  Insignificant growth.   EKG preoperative dated December 31, 2002:  Demand pacemaker, probable  underlying sinus bradycardia, delayed R-wave progression, ST and T-wave  abnormalities, consider anterior ischemia.  No significant change since last  tracing on May 19, 1999 confirmed by [name], M.D.  Postoperative  EKG on February 09, 2003:  Sinus arrhythmia, first degree AV block,  incomplete right bundle branch block, inferior infarct, ST and T-wave  abnormalities, consider anterior lateral ischemia since December 31, 2002.  Demand pacing not seen on this tracing, confirmed by [name], M.D.  Further cardiac tracings are found in the chart.  Right hip films on  February 09, 2003 status post right total hip arthroplasty  with expected  postoperative change, slightly rotated examination.  Nuclear medicine  myocardial perfusion test on February 10, 2003:  No scintigraphic evidence  for left ventricular myocardial infarction or scar, small hypokinetic  segment in the apicoseptal Frank, normal left ventricular ejection fraction.   HOSPITAL COURSE:  The patient was admitted to [facility], taken to  the operating room, underwent the above stated procedure.  The patient was  taken to recovery room and had hypotension.  Due to his significant cardiac  history, cardiology was consulted to assist with his postoperative care and  to rule out any type of cardiac event.  It was felt his hypotension was  postoperatively, possibly consistent with ischemic versus a vasovagal event.  Cardiac enzymes were checked which were found to be negative.  They did  recommend fluid bolusing and monitoring.  He was transferred to the floor  for telemetry.  On day one the patient was doing so-so.  He was seen in  rounds by [name], M.D.  Said he was hurting, but not in extreme pain.  He was initially placed on p.o. and PC analgesics for pain control following  surgery.  He was given Coumadin for postoperative DVT prophylaxis.  His home  medications were restarted.  He was placed on touchdown weightbearing to the  right lower extremity, placed on a cardiac diet.  Laboratories were followed  very closely.  Hemovac drain placed at time of surgery was pulled on  postoperative day one.  Due to the hypotensive event, it was felt the  patient would be best served going under an adenosine Cardiolite study.  Cardiac enzymes were negative and the Cardiolite proved to be negative.  His  blood pressure did improve.  Due to the fact that he was not aggressive with  his therapy, he was also placed on Lovenox for coverage until his INR became  therapeutic.  Once he was stable on postoperative day two he was transferred to the  orthopedic floor.  The patient was slowly progressed with physical  therapy initially, but did start to proceed once he was clear from a cardiac  standpoint.  He was getting around with minimal assist.  By day two he was  walking approximately 15 feet but then actually increased up to greater than  120 feet by postoperative day four.  The  wound was followed and he was  healing his incision quite well.  He remained on Lovenox until his INR was  therapeutic.  Due to the initial slow progression of his physical therapy  and also the events in his medical history, it was felt the patient would  best be served by undergoing rehabilitation consult.  The patient was seen  in consultation by rehabilitation services and felt it would be appropriate  for an inpatient rehabilitation stay.  By day four he was weaned off his PCA  and Foley.  He was weaned over to p.o. medications and continued to progress  with therapy.  By day five he was waiting on a rehabilitation SACU bed.  His  wound only showed some very scant amount of serous drainage, otherwise no  problems, no sinus infection.  It was noted that a bed became available  later on in the rehabilitation unit that day.  He was doing much better with  regards to his blood pressure, stable from a cardiac standpoint, progressing  with physical therapy and decided he would be transferred to rehabilitation.   DISCHARGE PLAN:  The patient transferred to [facility] on  February 14, 2003.   DISCHARGE DIAGNOSES:  Please see above.   DISCHARGE MEDICATIONS:  He is to continue his current medications as he is  currently taking ____________ [facility] is to be sent over with the chart.   DIET:  Cardiac diet.   ACTIVITY:  Touchdown weightbearing.  Continue PT and OT for gait training,  ambulation, ADLs while in rehabilitation unit.  Total hip protocol.   FOLLOWUP:  Follow up two weeks from surgery or following discharge from  [facility] Unit.   DISPOSITION:  [facility] Rehabilitation.   CONDITION ON TRANSFER:  Improving.      [name], P.A.              [name], M.D.    ALP/MEDQ  D:  02/28/2003  T:  03/01/2003  Job:  [id number]   cc:   Angelena Sole, M.D. [facility]   Maisie Fus D. [name], M.D.

## 2010-09-28 NOTE — Op Note (Signed)
   [name], [name]                             ACCOUNT NO.:  [id number]   MEDICAL RECORD NO.:  [id number]                   PATIENT TYPE:  INP   LOCATION:  4703                                 FACILITY:  [facility]   PHYSICIAN:  Vikki Ports, M.D.         DATE OF BIRTH:  [birth year]   DATE OF PROCEDURE:  01/07/2003  DATE OF DISCHARGE:                                 OPERATIVE REPORT   PREOPERATIVE DIAGNOSES:  Biliary pancreatitis and acute cholecystitis.   POSTOPERATIVE DIAGNOSES:  Biliary pancreatitis and acute cholecystitis.   OPERATION/PROCEDURE:  Laparoscopic cholecystectomy with intraoperative  cholangiogram.   FINDINGS:  Thickened guide wire wall, normal cholangiogram.   SURGEON:  Vikki Ports, M.D.   ASSISTANT:  Dr. Derrell Lolling.   ANESTHESIA:  General.   DESCRIPTION OF PROCEDURE:  The patient was taken to the operating room,  placed in a supine position after adequate anesthesia was induced.  The  abdomen was prepped and draped in the normal sterile fashion.  An 11 mm  trocar was placed in the right paramedian position using the Optiview  technique.  The pneumoperitoneum was obtained and under direct  visualization, an 11 mm port was placed in the subxiphoid region, two 5 mm  ports were placed in the right abdomen.  The gallbladder was identified.  Very thick and was aspirated.  It was retracted laterally until the neck of  the gallbladder was identified.  The cystic duct was dissected.  A clip was  placed proximally on the gallbladder.  A small ductotomy was made and  cholangiogram was performed.  This showed good free flow into the duodenum.  No filling defects and normal hepatic radicals.  The cystic duct was then  triply clipped and divided.  The cystic artery was also triply clipped and  divided.  The gallbladder was taken off the gallbladder  bed using Bovie electrocautery, placed in an endo-catch bag and removed  through the paramedian port.  Adequate  hemostasis was assured.  The skin was  closed with staples.  The patient tolerated the procedure well and went to  PACU in good condition.                                                Vikki Ports, M.D.    [facility]H/MEDQ  D:  01/09/2003  T:  01/09/2003  Job:  [id number]   cc:   Barbette Hair. Arlyce Dice, M.D. [facility]   Vania Rea. Jarold Motto, M.D. [facility]   Maisie Fus D. [name], M.D.   Ollen Gross, M.D.  [address]  [place]  [place] [address]  Fax: [phone]

## 2010-09-28 NOTE — Assessment & Plan Note (Signed)
[facility]                              CARDIOLOGY OFFICE NOTE   NAME:[name], [name]                          MRN:          [id number]  DATE:02/06/2006                            DOB:          18-Apr-1926    [name] is in for followup visit.  To summarize, he has had no recurrence  of chest pain.  He actually feels well.  He underwent diagnostic cardiac  catheterization.  He said the symptoms that he has have been similar to what  he had with an acute cardiac event in the past.  The circumflex site that  was stented was widely patent.  The vein graft of the diagonal was intact  and it created competitive flow in the LAD.  The LIMA was intact, but as  noted with the competition.  There was a reduced flow.  The PD PLA graft  also was intact, and there is mild narrowing just distal to the first touch-  down, but that may be due to competitive flow through the native artery,  creating a dilutional effect.  Finally, there is a first small diagonal that  is less than 1 mm and has about 80% proximal narrowing.  Whether or not this  is a source of the pain is unclear.  Nonetheless, we felt that continued  medical therapy was warranted.  He has had no more symptoms.  He had a  little bit of bruising on his cath site.   On his examination today, the blood pressure is 121/77, pulse 52.  Lung  fields clear.  The groin has ecchymosis but no bruit.   IMPRESSION:  1. Coronary artery disease, status post coronary artery bypass graft      surgery with status as noted at recent catheterization.  2. Hypercholesterolemia, on lipid-lower therapy.  3. Status post permanent pacer implantation with a single-chamber VVI.   DISPOSITION:  1. The patient will see [place] back in followup in about 6 weeks.  2. Lipid and liver profile will be obtained.       Arturo Morton. [name], MD, [facility]     TDS/MedQ  DD:  02/06/2006  DT:  02/08/2006  Job #:  [id number]

## 2010-09-28 NOTE — Discharge Summary (Signed)
NAMEYUYA, VANWINGERDEN                             ACCOUNT NO.:  [id number]   MEDICAL RECORD NO.:  [id number]                   PATIENT TYPE:  IPS   LOCATION:  4153                                 FACILITY:  [facility]   PHYSICIAN:  Ranelle Oyster, M.D.             DATE OF BIRTH:  12-11-1925   DATE OF ADMISSION:  02/14/2003  DATE OF DISCHARGE:  02/18/2003                                 DISCHARGE SUMMARY   DISCHARGE DIAGNOSES:  1. Right total hip arthroplasty secondary to osteoarthritis February 09, 2003.  2. Postoperative hypotension.  3. Pain medication.  4. Coumadin for deep vein thrombosis prophylaxis.  5. Coronary artery disease with coronary artery bypass grafting and     pacemaker.  6. Gastroesophageal reflux disease.  7. Benign prostatic hypertrophy.  8. Hypercholesterolemia.   HPI:  This is a 77 year old male admitted February 09, 2003, with end-stage  osteoarthritis of the right hip and no relief with conservative care.  Underwent a right total hip arthroplasty February 09, 2003, per Dr.  Lequita Halt.  Placed on Coumadin for deep vein thrombosis prophylaxis and  touchdown weightbearing.  Postoperative hypotension.  Transferred to Unit  3300.  Cardiology consult, Dr. Valera Castle.  Cardiac markers were negative.  Received intravenous fluids.  Cardiolite study was negative on February 10, 2003.  Advised to continue with Tenormin.  Therapy was resumed.  No further  episodes.  He was transferred back to the orthopedic unit, 5000.  Minimal  assist for bed mobility and ambulation.  Minimal assist for transfers.  Latest chemistries unremarkable.  Admitted for comprehensive rehabilitation  program.   PAST MEDICAL HISTORY:  See discharge diagnoses.   PAST SURGICAL HISTORY:  1. Hernia repair.  2. Penile implant in 1992.   PRIMARY M.D.:  Dr. Ruthine Dose with [facility].   ALLERGIES:  CODEINE.   Occasional alcohol, no tobacco.   MEDICATIONS PRIOR TO ADMISSION:  1. Tenormin 50 mg  daily.  2. Aciphex 20 mg daily.  3. Pravachol 40 mg daily.  4. Aspirin 2 tablets daily.   SOCIAL HISTORY:  Lives alone in [place].  Independent prior to admission  with a cane and driving.  Two-level home, two steps to entry, bedroom  downstairs.  Daughter can assist as needed on discharge.   HOSPITAL COURSE:  Patient with progressive gains while on rehabilitation  services with therapies initiated on a b.i.d. basis.  The following issues  are followed during the patient's rehabilitation course.  Pertaining to Mr.  Rybacki right total hip arthroplasty, surgical site healing nicely.  Steri-  Strips intact.  Touchdown weightbearing with hip precautions per orthopedic  service, Dr. Ollen Gross.  Pain management ongoing with the use of Tylox  as needed.  Maintained on Coumadin for deep vein thrombosis prophylaxis with  latest INR of 3.7.  He would complete Coumadin protocol to be followed by  [facility] services.  It was advised that the patient resume his  aspirin therapy after Coumadin completed for a history of coronary artery  disease with coronary artery bypass grafting.  He had no further bouts of  chest pain or shortness of breath.  No hypotension during his rehabilitation  course after hospital follow-up of Dr. Valera Castle, [facility].  He  did have a Cardiolite study while on the acute side of the hospital that was  negative.  He would continue on his Aciphex for gastroesophageal reflux  disease.  He was voiding without difficulty.  He did have a urinalysis study  that showed no growth.  Overall, for his functional mobility he was  supervision for bed mobility, transfers, ambulation.  Overall mobility,  endurance greatly improved.  Home health therapies have been arranged.   Latest laboratories showed an INR of 3.7, hemoglobin 10.6, hematocrit 31.  Sodium 138, potassium 3.6, BUN 7, creatinine 0.8.   DISCHARGE MEDICATIONS:  1. Coumadin, latest dose of 4 mg,  INR pending on the day of discharge.  2. Tenormin 50 mg daily.  3. Protonix which the patient would substitute home Aciphex 20 mg daily.  4. Pravachol 40 mg daily.  5. Tylox as needed for pain.  6. Tylox as needed for pain.  7. Advised to resume aspirin therapy after Coumadin completed.   ACTIVITY:  Touchdown weightbearing with hip precautions.   DIET:  Regular.   WOUND CARE:  Cleanse incision daily with warm soap and water.    SPECIAL INSTRUCTIONS:  Home health nurse for prothrombin time on Monday,  December 22, 2002, results per [facility] services.  The patient  should follow up with Dr. Lequita Halt, call for appointment.      Mariam Dollar, P.A.                     Ranelle Oyster, M.D.    DA/MEDQ  D:  02/17/2003  T:  02/18/2003  Job:  [id number]   cc:   Ollen Gross, M.D.  [address]  [place]  [place] [address]  Fax: [phone]   Angelena Sole, M.D. [facility]   Jesse Sans. Wall, M.D.

## 2010-09-28 NOTE — Cardiovascular Report (Signed)
[name]. [facility]  Patient:    [name]                           MRN: [id number] Proc. Date: 05/21/99 Adm. Date:  [id number] Attending:  Mirian Mo CC:         [name]. [name], M.D. [facility]             Cardiovascular Laboratory             Edwin L. Judie Grieve, M.D.                        Cardiac Catheterization  INDICATIONS:  [name] is a pleasant gentleman who is well known to [place].  He has had previous surgery in March 1999.  He presented about six months ago, at which time he underwent percutaneous intervention of the native circumflex artery.  His vein graft to the obtuse marginal system was occluded at that time.  The current study was done to assess coronary anatomy.  PROCEDURES: 1. Left heart catheterization. 2. Selective coronary angiography. 3. Selective left ventriculography. 4. Saphenous vein graft angiography. 5. Internal mammary angiography.  DESCRIPTION OF PROCEDURE:  The procedure was performed from the right femoral artery using 6-French catheters.  He tolerated the procedure without complication. Of note, The patients blood pressure did drop substantially when he went from the normal AV conduction to ventricular pacing.  ANGIOGRAPHIC DATA: 1. Left Main Coronary Artery:  Free of critical disease. 2. Left Anterior Descending Artery:  Occluded beyond the origin of a septal. 3. GRAFTS:    a. The internal mammary to the distal LAD is atretic and fills slowly in       antegrade fashion.  One does see retrograde filling of the left internal       mammary because of good filling through the vein graft to the diagonal.  suspect it is probably atretic but patent.    b. The vein graft to the diagonal is widely patent.  There is perhaps 40%       narrowing at the previous site of PTCA distal to the graft insertion.  It       fills via diagonal retrograde, where there is about a 90% stenosis at its       insertion into the LAD.  The  LAD fills through these collaterals. 4. Circumflex Vessel:  Demonstrates some moderate luminal irregularity throughout.    There are three previously placed stents, all of these appear to be widely    patent.  There is perhaps 50% narrowing just beyond the bifurcation involving    the large second marginal branch, and in between the second and third stent    site.  Distally there is diffuse irregularity, but no high-grade critical    lesions. 5. Right Coronary Artery:  Demonstrates a 75-80% mid stenosis; is totally occluded    beyond the origin of the PDA.  The PDA itself has about 50% segmental    plaquing.  The saphenous vein graft to the PDA and posterolateral is intact.  VENTRICULOGRAPHY:  Done in the RAO projection.  Reveals preserved overall left ventricular function.  There is moderate ventricular ectopy during the contrast  injection, preventing good calculation of ejection fraction.  However, there are no obvious segmental wall motion abnormalities detected.  IMPRESSION: 1. Continued patency of the previous sites of stenting, with mild narrowing between  stent #2 and #3; not felt to be hemodynamically significant. 2. Continued patency of the diagonal branch, with no evidence of significant    restenosis distal to the graft insertion. 3. Continued patency of a sequential PDA/posterolateral graft. 4. Known occlusion of the saphenous vein graft to the OM2 and OM3. 5. 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  DISPOSITION:  We will recommend continued medical therapy. DD:  05/21/99 TD:  05/21/99 Job: [id number]

## 2010-09-28 NOTE — Consult Note (Signed)
[name], [name]                             ACCOUNT NO.:  [id number]   MEDICAL RECORD NO.:  [id number]                   PATIENT TYPE:  INP   LOCATION:  4703                                 FACILITY:  [facility]   PHYSICIAN:  Vikki Ports, M.D.         DATE OF BIRTH:  Jun 24, 1925   DATE OF CONSULTATION:  01/09/2003  DATE OF DISCHARGE:                                   CONSULTATION   DIAGNOSIS:  Acute pancreatitis.   HISTORY OF PRESENT ILLNESS:  The patient is a 77 year old white male who  last Tuesday began having epigastric  abdominal pain, nausea and vomiting.  He was scheduled for  hip replacement the following  day which was  cancelled. The patient then presented to the emergency room after 2 to 3  days without improvement. The patient was also having some chills and rigors  3 days ago and pain rated as 8/10. Since his time in the hospital over the  last 48 hours he has had complete resolution of his abdominal pain. He has  been maintained on a clear liquid diet. He has also been cleared by Dr.  Rosalyn Charters office  for hip replacement and most recent Cardiolite study done  12 months ago revealed  no evidence of ischemia.   PAST MEDICAL HISTORY:  1. Coronary artery disease.  2. Benign prostatic hypertrophy.  3. Barrett's esophagitis.   PAST SURGICAL HISTORY:  1. Pacemaker x2, most recently in 1995.  2. Bilateral inguinal hernia repairs.  3. Coronary artery bypass graft x6 in 1999.  4. Esophagogastroduodenoscopy and colonoscopy earlier this month showed     Barrett's esophagitis and diverticulosis.   MEDICATIONS:  1. Aspirin 325 mg p.o. a day.  2. Atenolol 50 mg every day.  3. Aciphex 20 mg a day.  4. Tylenol  p.r.n.  5. Pravachol 40 mg a day.   SOCIAL HISTORY:  Significant for 1 black [place] a night. Negative for  tobacco use.   REVIEW OF SYSTEMS:  Resolution of abdominal pain, nausea and vomiting,  rigors, fevers and chills.   PHYSICAL EXAMINATION:   GENERAL:  He is an age appropriate white male in no  distress.  HEENT:  Normocephalic and atraumatic. Pupils are equal, round and reactive  to light. Sclerae anicteric. Conjunctivae without injection.  NECK:  Supple and soft without thyromegaly.  LUNGS:  Clear to auscultation and percussion x2.  HEART:  Regular rate and rhythm without obvious murmur, rub or gallops.  ABDOMEN:  Soft, completely nontender  with no hepatosplenomegaly.  EXTREMITIES:  Normal gait and station. No clubbing, cyanosis or edema of  upper  or lower extremities.  PSYCHIATRIC:  Good insight and judgment. He is oriented to time, place and  person.   LABORATORY DATA:  His ultrasound shows a thickened gallbladder  wall and  sludge. An EKG showed a paced rhythm with a bundle branch block.   Initial laboratory values  showed an elevated liver function tests as well as  a bilirubin of 6.4. Today his bilirubin has decreased  down to 2.5.   IMPRESSION:  Acute pancreatitis, clinically and chemically resolving,  probable passed common bile duct stone.   PLAN:  Laparoscopic cholecystectomy  with interoperative cholangiogram. I  will discuss this with anesthesiology concerning his pacemaker as well as  cardiac  history.                                                 Vikki Ports, M.D.    KRH/MEDQ  D:  01/09/2003  T:  01/09/2003  Job:  [id number]

## 2010-09-28 NOTE — H&P (Signed)
[name], [name]                             ACCOUNT NO.:  [id number]   MEDICAL RECORD NO.:  [id number]                   PATIENT TYPE:  INP   LOCATION:  NA                                   FACILITY:  [facility]   PHYSICIAN:  Ollen Gross, M.D.                 DATE OF BIRTH:  [birth year]   DATE OF ADMISSION:  DATE OF DISCHARGE:                                HISTORY & PHYSICAL   CHIEF COMPLAINT:  Right hip pain.   HISTORY OF PRESENT ILLNESS:  The patient is a 77 year old male who has been  seen by Ollen Gross, M.D. for right hip pain.  He was referred over by  Dionne Ano. Gramig, M.D. for a painful right hip which had been ongoing for  over 10 years now.  He denies any specific injury or accident leading up to  this pain.  Pain is in the groin radiating down to the right thigh and past  the knee.  He has had some low back pain associated with this but he is not  having any left-sided pain.  He has not experienced any type of  paraesthesias.  He is seen in the office and found to have some very limited  motion in and about the right hip.  X-rays also show end-stage arthritis  with bone on bone changes and nearly fusion of the hip itself and also noted  collapse of the femoral head.  It is felt his arthritis is to the end-stage  point of the right hip and has intractable pain.  Most predictable means of  improving the pain and function would be a total hip arthroplasty.  Risks  and benefits discussed and patient has elected to proceed with surgery.   ALLERGIES:  No known drug allergies.   CURRENT MEDICATIONS:  1. Pravachol 40 mg.  2. Atenolol 50 mg.  3. Aciphex 20 mg.  4. Aspirin 325 mg.   PAST MEDICAL HISTORY:  1. Coronary arterial disease.  2. Mild hypercholesterolemia.  3. Hiatal hernia.  4. Occasional reflux disease.  5. Diverticulitis.  6. Benign prostatic hypertrophy.   PAST SURGICAL HISTORY:  1. Coronary artery bypass grafting in six vessels in 1999.  2. Cardiac  catheterization with three stents in 2000.  3. Two hernia repairs in 2003.  4. Partial 80% prostatectomy in 2003.  5. Penile implant 1992.  6. He has undergone an EGD and colonoscopy.  7. He has also had a pacemaker placement in 1980 and revised and had a     second pacemaker placement back in 1995.   SOCIAL HISTORY:  Lives alone.  Retired.  Nonsmoker.  Only one drink of  alcohol daily.  He has five children.  Lives in a two-story home, but states  he can stay on the first floor.   FAMILY HISTORY:  Father deceased age 57 with a history of heart failure  and  diabetes.  He has two brothers, one living and one deceased.  Both had heart  disease.   REVIEW OF SYSTEMS:  GENERAL:  No fevers, chills, night sweats.  NEUROLOGIC:  No seizures, syncope, paralysis.  RESPIRATORY:  No shortness of breath,  productive cough, or hemoptysis.  CARDIOVASCULAR:  No chest pain, angina,  orthopnea.  Significant history of coronary vessel disease.  GASTROINTESTINAL:  He does have some occasional reflux.  No nausea,  vomiting, diarrhea, constipation at this time.  GENITOURINARY:  No dysuria,  hematuria, discharge at this time.  MUSCULOSKELETAL:  Pertinent to that of  the hip found in the history of present illness.   PHYSICAL EXAMINATION:  VITAL SIGNS:  Pulse 60, respirations 12, blood  pressure 148/82.  GENERAL:  The patient is a 77 year old white male well-nourished, well-  developed, no acute distress.  He is alert, oriented, cooperative.  HEENT:  Normocephalic, atraumatic.  Pupils round, reactive.  Oropharynx is  clear.  EOMs are intact.  NECK:  Supple.  CHEST:  Clear.  HEART:  Regular rhythm with occasional split S2 heard over the [place] point.  No rubs, thrills, palpitations.  ABDOMEN:  Soft, slightly round abdomen, nontender.  Bowel sounds are  present.  RECTAL:  Not done.  Not pertinent to present illness.  BREASTS:  Not done.  Not pertinent to present illness.  GENITALIA:  Not done.  Not  pertinent to present illness.  EXTREMITIES:  Right lower extremity:  He has only about 90 degrees of hip  flexion.  There is no internal rotation or external rotation.  Very limited  to no abduction.  Approximately 5/8 inch shorter on the right as compared to  the left lower extremity.   IMPRESSION:  1. Osteoarthritis right hip.  2. Coronary arterial disease.  3. Mild hypercholesterolemia.  4. Hiatal hernia.  5. Gastroesophageal reflux disease.  6. Diverticulitis.  7. Benign prostatic hypertrophy status post partial prostatectomy.  8. Status post coronary artery bypass graft of six vessels.    PLAN:  The patient will be admitted to [facility] to undergo a  right total hip arthroplasty.  Surgery will be performed by Ollen Gross,  M.D.  The patient's cardiologist is Arturo Morton. [name], M.D.  Arturo Morton.  [name], M.D. will be notified of the room number on admission, be consulted  for any medical assistance with this patient throughout the hospital course.      Alexzandrew L. Julien Girt, P.A.              Ollen Gross, M.D.    ALP/MEDQ  D:  01/05/2003  T:  01/06/2003  Job:  [id number]   cc:   Angelena Sole, M.D. [facility]   Maisie Fus D. [name], M.D.   Ollen Gross, M.D.  [address]  [place]  [place] [address]  Fax: [phone]

## 2010-09-28 NOTE — H&P (Signed)
NAMEADIB, [name]                   ACCOUNT NO.:  [id number]   MEDICAL RECORD NO.:  [id number]          PATIENT TYPE:  OIB   LOCATION:  NA                           FACILITY:  [facility]   PHYSICIAN:  Arturo Morton. [name], MD, FACCDATE OF BIRTH:  03/11/1926   DATE OF ADMISSION:  DATE OF DISCHARGE:                                HISTORY & PHYSICAL   CHIEF COMPLAINT:  Chest pain.   HISTORY OF PRESENT ILLNESS:  [name] is a very pleasant gentleman well  known to me.  He has had a prior pacemaker put in many years ago and in 1999  underwent revascularization surgery.  At that time, he had saphenous vein  grafts to the PD/PLA, saphenous vein graft to the diagonal, internal mammary  to the LAD, and a vein graft to the obtuse marginal which eventually  occluded.  He then subsequently underwent percutaneous stenting of the AV  circumflex and last underwent catheterization in 2001.  Recently, he has had  a couple of episodes of chest pain, and he says they are very similar to  what he has had in the past when he has had to have a stent performed.  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  He  came back shortly after 5 a.m., went back to bed, and then got up and cut  wood.  Not long after coming in from cutting wood the patient developed  about 20 minutes of substernal chest pain.  This occurred after eating in  apple with peanut butter.  He says, however, it was similar to his cardiac  symptoms from the past.  Importantly, the patient does have a known  abdominal aortic aneurysm that is 2.8 x 2.7 by ultrasound previously noted.  He has not been having particularly exertional symptoms recently, but again  he said that these symptoms did remind him of what he has had in the past.   PAST MEDICAL HISTORY:  1. In 1992 the patient had a penile implant.  2. In 1980 he had a pacer implant.  3. In 1995 he had another pacer implant.  4. CABG x6 in 1999.  5. He is also had  laparoscopic cholecystectomy in August 2004.   CURRENT MEDICATIONS:  1. Atenolol 50 mg daily.  2. Pravachol 80 mg daily.  3. Prilosec over-the-counter.  4. Enteric-coated aspirin 81 mg daily.  5. Zetia 10 mg daily.  6. He takes nitroglycerin p.r.n.   He has a PAIN MEDICINE that he is not aware of that he is allergic to.   SOCIAL HISTORY:  The patient lives alone.  He does not smoke. He has five  daughters.   He has two brothers and a father who had coronary artery disease.   REVIEW OF SYSTEMS:  Is remarkable only for the cardiac symptoms; it is  otherwise negative.   PHYSICAL EXAMINATION:  GENERAL:  He is an alert and oriented gentleman in no  acute distress.  HEENT:  Examination is unremarkable.  VITAL SIGNS:  Weight is 193 pounds, blood pressure 140/82,  pulse 61.  CHEST:  The lung fields are clear.  Pacemaker pocket looks good>  CARDIAC:  The PMI is nondisplaced.  ABDOMEN:  Soft.  Femoral pulses are intact.  EXTREMITIES:  Intact.   IMPRESSION:  1. Recurrent substernal chest pain with prior revascularization surgery.  2. Status post pacer implant.  3. Status post stent to the circumflex coronary artery.   DISPOSITION:  The patient will be admitted for outpatient cardiac  catheterization.  Risks, benefits, and alternatives have been discussed with  the patient and he is agreeable to proceed.      Arturo Morton. [name], MD, [facility]  Electronically Signed     TDS/MEDQ  D:  01/29/2006  T:  01/30/2006  Job:  [id number]

## 2010-09-28 NOTE — Assessment & Plan Note (Signed)
[facility]                              CARDIOLOGY OFFICE NOTE   NAME:[name], [name]                          MRN:          [id number]  DATE:01/29/2006                            DOB:          [birth year]    [name] is in for a followup visit.  To briefly summary, [name] had an  episode of chest discomfort about 2 weeks ago.  It lasted about 10-15  minutes.  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  He came  back and went back to bed.  He then got up and cut wood.  He came into the  house after cutting wood, was eating an apple with some peanut butter on it,  and developed some discomfort in the mid chest.  He said this was similar to  what he has had in the past.  He eventually took some nitroglycerin, and the  pain eased off.  He has not had frequent episodes of pain, and his kids have  made him cut back on some things.  His last catheterization, he had known  occlusion of the saphenous vein graft to the circumflex system.  He had  subsequently stenting of the long segment of the circumflex artery in  January of 2001.  That was his last cardiac catheterization.  Importantly,  the patient had atresia of the mammary to the LAD, a patent saphenous vein  graft to the diagonal, a patent saphenous vein graft sequentially to the PDA  and PLA.  He has had a permanent pacer put in for a heart block in the past,  and it is a single chamber pacemaker.   PHYSICAL EXAMINATION TODAY:  VITAL SIGNS:  Blood pressure is 140/82, pulse  61.  The lung fields are clear.  The weight is 193 pounds.  CARDIAC:  Rhythm is regular.  The peripheral pulses are intact.   ELECTROCARDIOGRAM:  EKG reveals AV block with underlying ventricular pacing  at a rate of 61.  There was T wave inversion noted in lead AVF, but with his  underlying pacing, it is difficult to know what this represents.   [name] has some recurrent symptoms.  He says this is similar to what  he  has had when he has had his previous stent put in.  It occurred shortly  after cutting wood.  I think that we likely need to consider repeat cardiac  catheterization.  The patient is agreeable to coming in on Friday to have  this done, and has agreed that if he has any more symptoms, he will come in  prior to Friday morning and be admitted to the hospital.  Risks, benefits  and alternatives were discussed in detail, and the patient was agreeable to  proceed.                              Arturo Morton. [name], MD, [facility]    TDS/MedQ  DD:  01/29/2006  DT:  01/30/2006  Job #:  [id number] 

## 2010-09-28 NOTE — Cardiovascular Report (Signed)
[name], MESA                   ACCOUNT NO.:  [id number]   MEDICAL RECORD NO.:  [id number]          PATIENT TYPE:  OIB   LOCATION:  2899                         FACILITY:  [facility]   PHYSICIAN:  Arturo Morton. [name], MD, FACCDATE OF BIRTH:  Oct 16, 1925   DATE OF PROCEDURE:  01/31/2006  DATE OF DISCHARGE:                              CARDIAC CATHETERIZATION   INDICATIONS:  Mr. Main is a 77 year old gentleman who presents with  recurrent chest discomfort similar to what he had prior to his original  bypass surgery.  With this, we elected to recommend a repeat catheterization  as he has had prior stenting x3 to the circumflex artery.  Current study was  done to assess coronary anatomy.   PROCEDURES:  1. Left heart catheterization.  2. Selective coronary territory.  3. Selective left ventriculography.  4. Saphenous vein graft angiography x3.  5. Selective left internal mammary angiography.   DESCRIPTION OF PROCEDURE:  The patient was brought to the catheterization  lab and prepped and draped in the usual fashion.  Through an anterior  puncture, the right femoral artery was easily entered on the first stick.  A  5-French sheath was placed.  Views of the left and right coronary arteries  were obtained.  Vein graft angiography and internal mammary angiography was  performed.  IMA angiography was performed with an internal mammary catheter.  Central aortic and left ventricular pressures were measured with the  pigtail.  Ventriculography was performed in the RAO projection.  The patient  tolerated the procedure well and there were no complications.  Because of  coiling of the J-wire in the distal aorta, we did use a Glidewire to gain  access to the central aorta.  There were no complications.   HEMODYNAMIC DATA.:  1. Central aortic pressure 142/76.  2. Left ventricular pressure 143/16.  3. No gradient on pullback across aortic the valve.   ANGIOGRAPHIC DATA.:  1. The left main is heavily  calcified, but without significant focal      obstruction.  2. The left anterior descending artery is calcified as well.  The vessel      is totally occluded after the takeoff of the small septal to the      diagonal.  The diagonal is a very small-caliber vessel, being less than      a 1 mm artery with about 80% proximal narrowing,  3. The saphenous vein graft to the diagonal is intact.  It does fill      retrograde into the LAD system, and there is a stenosis in the LAD      between the mid LAD and distal LAD.  There is competitive flow      distally.  The internal mammary to the distal LAD does appear to be      intact as well and, as noted, the flow is slow and the mammary itself      somewhat small because of the competitive nature of the filling of the      distal LAD.  Nonetheless, this area appears to be completely  supplied.  4. The circumflex has multiple small marginal branches.  There are 3      stents in the native vessel in the proximal, mid and distal portion of      the circumflex.  After the second major marginal branch there is about      20-30% narrowing before the third stent but all stents appear to be      widely patent without significant focal narrowing within the stented      site.  There is no evidence of significant restenosis.  5. The right coronary artery demonstrates an 80% to 90% mid stenosis.  The      vessel does fill into the distal right and then fills into the PDA.  6. The vein graft into the PDA and posterolateral branch appears to be      smooth and patent.  There is some narrowing between the first a second      limbs, but this likely is related to competitive filling within the      graft itself and the RAO view, the vessel does not appear to be      significantly compromised at all.  Flow into the posterolateral branch      is excellent.  The distal PDA is seen actually better through the      native graft injection, but this may be related just  simply to the      force of the injection itself.   Ventriculography in the RAO projection reveals preserved systolic function.  No definite segmental abnormalities of contraction are identified.  Importantly, the patient is paced.   CONCLUSIONS:  1. Preserved overall left ventricular function.  2. Continued patency of the internal mammary to the left anterior      descending artery.  3. Continued patency of saphenous vein graft to the diagonal.  4. Patency of the stents to the circumflex vessel.  5. Patent saphenous vein graft to the posterior descending artery and      posterolateral branch with competitive filling as noted above.   DISPOSITION:  Right now we will treat the patient medically.  I will  continue to follow carefully in the office.      Arturo Morton. [name], MD, [facility]  Electronically Signed     TDS/MEDQ  D:  01/31/2006  T:  02/02/2006  Job:  [id number]   cc:   Cardiovascular Laboratory

## 2010-09-28 NOTE — Consult Note (Signed)
NAMEJADRIEN, [name]                             ACCOUNT NO.:  [id number]   MEDICAL RECORD NO.:  [id number]                   PATIENT TYPE:  INP   LOCATION:  4703                                 FACILITY:  [facility]   PHYSICIAN:  Barbette Hair. Arlyce Dice, M.D. [facility]          DATE OF BIRTH:  Jul 26, 1925   DATE OF CONSULTATION:  DATE OF DISCHARGE:                                   CONSULTATION   GASTROINTESTINAL CONSULTATION:   CHIEF COMPLAINT:  Abdominal pain.   HISTORY OF PRESENT ILLNESS:  [name] is a 77 year old white male admitted  with a three-day history of upper abdominal pain.  Pain was sharp, severe  and without radiation.  It is accompanied by nausea.  He has a history of  frank chills and has noted dark urine.  He also has a history of GERD and  has take PPIs for this.  He recently underwent an upper endoscopy, which  apparently was negative, and a colonoscopy that showed diverticulosis.  He  was admitted last evening because of persistent pain and jaundice.  He  denies alcohol abuse.  He admits to drinking one drink daily.  He has no  prior similar GI complaints.  He notes, today, the pain has entirely  subsided.   PAST MEDICAL HISTORY:  1. Pertinent for coronary artery disease.  He is status post angioplasty and     stent placement.  2. He is status post herniorrhaphy.   MEDICATIONS:  Include Tenormin and aspirin; he takes a lipid-lowering agent.   ALLERGIES:  He has on allergies.   PHYSICAL EXAMINATION:  GENERAL:  He is a healthy-appearing male.  He is  anicteric.  VITAL SIGNS:  Are stable.  HEENT:  Is within normal limits.  CHEST:  Is clear.  HEART:  There are no cardiac murmurs, gallops, or rubs.  ABDOMEN:  He has mild midepigastric tenderness without guarding or rebound.  There are no abdominal masses or organomegaly.  EXTREMITIES:  He has no peripheral edema.   LABORATORY DATA:  Is pertinent for bilirubin of 5.8, alk phos 173, AST 167,  ALT 217, amylase 375, lipase  945.   White count 8.7, hemoglobin 14.2, MCV 102.8.   Ultrasound demonstrates a thickened gallbladder wall and gallbladder sludge.  No discrete stones are seen.  Bile duct is not well visualized.   IMPRESSION:  Acute pancreatitis.  I suspect this may be gallstone  pancreatitis as evidenced by gallbladder sludge and abnormal liver tests.  Although he denies heavy alcohol use, the elevated MCV raises a question of  alcohol abuse, which, in turn, could be a cause for pancreatitis as well.  A  history of fevers and chills raise the question of cholangitis.   RECOMMENDATIONS:  1. Serial LFTs and amylase.  2. IV Cipro.  3. CT of the abdomen to better delineate pancreas and the biliary system.  4. To consider ERCP, pending the  results of his liver tests.  5. To consider surgical consult for possible cholecystectomy if biliary     pancreatitis appears to be the operative diagnosis.  6. Watch for alcohol withdrawal symptoms.                                                  Barbette Hair. Arlyce Dice, M.D. [facility]    RDK/MEDQ  D:  01/08/2003  T:  01/09/2003  Job:  [id number]   cc:   Angelena Sole, M.D. [facility]

## 2010-09-28 NOTE — Op Note (Signed)
[name], [name]                             ACCOUNT NO.:  [id number]   MEDICAL RECORD NO.:  [id number]                   PATIENT TYPE:  INP   LOCATION:  2854                                 FACILITY:  [facility]   PHYSICIAN:  Ollen Gross, M.D.                 DATE OF BIRTH:  05-15-1925   DATE OF PROCEDURE:  02/09/2003  DATE OF DISCHARGE:                                 OPERATIVE REPORT   PREOPERATIVE DIAGNOSIS:  Osteoarthritis, right hip.   POSTOPERATIVE DIAGNOSIS:  Osteoarthritis, right hip.   OPERATION PERFORMED:  Right total hip arthroplasty.   SURGEON:  Ollen Gross, M.D.   ASSISTANT:  Alexzandrew L. Julien Girt, P.A.   ANESTHESIA:  General.   ESTIMATED BLOOD LOSS:  200mg .   DRAINS:  Hemovac times one.   COMPLICATIONS:  None.   CONDITION:  Stable to recovery.   INDICATIONS FOR PROCEDURE:  [name] is a 77 year old male with severe end  stage arthritis of his right hip with pain refractory to nonoperative  management.  He presents now for right total hip arthroplasty.   DESCRIPTION OF PROCEDURE:  After successful administration of general  anesthetic, the patient was placed in the left lateral decubitus position  with the right side up and held with a hip positioner.  The right lower  extremity was isolated from the perineum with plastic.  He was then prepped  and draped in the usual sterile fashion.  A mini posterolateral incision was  made approximately three inches in length and the skin was cut with a 10  blade through subcutaneous tissue to the level of the fascia lata which was  incised in line with the skin incision.  Sciatic nerve was palpated and  protected and short rotators isolated off the femur.  Capsulectomy was then  performed.  Hip dislocated and center of the femoral head marked.  Trial  prosthesis placed such that the center of the trial head corresponds to the  center of his native femoral head.  Osteotomy line was marked on the femoral  neck and  osteotomy made with an oscillating saw.  Femur was then retracted  anteriorly to gain acetabular exposure.   Acetabular retractors were placed and then osteophytes and labrum were  removed.  He had a large inferior osteophyte which was removed.  I then  began reaming at 47 mm coursing out in increments of 2 to 55.  Then we  placed a 56 mm Pinnacle acetabular shell in anatomic position and transfixed  it with two domed screws.  He did have some arthritic cysts in the  acetabulum which we had curetted out and packed with reamings prior to  placing the cup.  We then placed a 32 mm neutral trial liner.   The femur was the prepared first with canal finder and irrigation.  Axial  reaming was performed to  15.5 mm and proximally  reamed to 20 D in the _____________  machine to a  large.  The 20 D large sleeve was placed with a 20 x 15 trial with 36 plus 8  neck, a 32 plus 0 head.  We reduced the hip and he had outstanding stability  in full extension with external rotation to 70 degrees flexion, 40 degrees  abduction, 90 degrees internal rotation, 90 degrees flexion, 70 degrees  internal rotation.  By placing the right leg on the leg, he still was short  a couple of millimeters so we went ahead and put a 32 plus 6 head on there  and it effectively equalizes lengths also.  Then removed all the trials and  placed a permanent Apex hole eliminator into the acetabular shell and then  the permanent 32 mm neutral marathon liner.  The permanent 20 D large sleeve  was placed and a 20 x 15 stem with 36 plus 8 neck.  This was 20 degrees  beyond his native anteversion  as his native version was neutral.  The  permanent 32 plus 6 head was placed.  The hip reduced to the same stability  parameters.  The wound was copiously irrigated with antibiotic solution.  Short rotators were reattached to the femur through drill holes.  Fascia  lata was closed over a Hemovac drain with interrupted #1 Vicryl.  Subcutaneous  closed with #1 and 2-0 Vicryl.  Subcuticular with running 4-0  Monocryl.  Incision clean and dry.  Steri-Strips and bulky sterile dressing  applied.  He was placed into a knee immobilizer where he was transported to  recovery  in stable condition.                                                Ollen Gross, M.D.    FA/MEDQ  D:  02/09/2003  T:  02/09/2003  Job:  [id number]

## 2010-09-28 NOTE — H&P (Signed)
[name], [name]                             ACCOUNT NO.:  [id number]   MEDICAL RECORD NO.:  [id number]                   PATIENT TYPE:  INP   LOCATION:  4703                                 FACILITY:  [facility]   PHYSICIAN:  Angelena Sole, M.D. [facility]              DATE OF BIRTH:  September 01, 1925   DATE OF ADMISSION:  01/07/2003  DATE OF DISCHARGE:                                HISTORY & PHYSICAL   CHIEF COMPLAINT:  Nausea, vomiting, abdominal pain.   HISTORY OF PRESENT ILLNESS:  The patient is a 77 year old white male who has  not been seen by primary care since April 20, 2001.  He was scheduled to  have a right hip replacement this past Wednesday, but it was cancelled  secondary to his nausea and vomiting.  On Tuesday, he began vomiting about  six times and continued on through Wednesday.  A few days prior to that he  had some mild nausea and a metallic taste in his mouth.  He has not  vomited since Wednesday, but has been feeling terrible since.  He had some  mid epigastric pain Wednesday evening, took Aciphex, Pepcid AC, Tylenol,  finally resolved.  Then on Thursday p.m., he had chills and shakes for about  45 minutes, had some mild abdominal pain, but once the chills and shakes  stopped he had fairly severe mid epigastric pain which he described as an  8/10.  Also had some sweats.  The pain did not radiate.  He took five  Tylenol, five ibuprofen, Pepcid AC, and Aciphex.  This did not help.  The  whole episode lasted about two hours.  This morning, he woke up and felt  some better, but called the office here to be seen as he had all the pain,  but also even though he is color blind, he noticed his urine was a different  color.  He denies any fevers or chills today.  No dysuria, frequency,  hesitancy.  He did have labs and an EKG done at [facility] a week ago for preop,  and last week in Dr. Rosalyn Charters office he had a pacemaker check and an  ultrasound for a small AAA that he has.   PAST  MEDICAL HISTORY:  1. Coronary artery disease.  2. BPH.  3. Barrett's esophagus.  4. He had an Adenosine Cardiolite on Oct 01, 2001.   PAST SURGICAL HISTORY:  1. Pacemaker.  2. Pilonidal cyst.  3. Penial implants.  4. Six vessel CABG in 1993.  5. Three coronary stents in 2000.  6. An EGD and colonoscopy done on December 20, 2002, showed Barrett's esophagus     and diverticulosis.   CARDIOLOGIST:  Arturo Morton. [name], M.D.   GASTROENTEROLOGIST:  Dr. Jarold Motto.   ALLERGIES:  No known drug allergies.   MEDICATIONS:  1. Aspirin.  2. Atenolol 50 mg daily.  3. Aciphex 20  mg.  4. Tylenol.  5. Pravachol 40 mg.   SOCIAL HISTORY:  The patient is separated, lives alone, never smoked.   REVIEW OF SYSTEMS:  HEAD:  No headache, dizziness, or syncope.  EYES:  Wears  glasses.  ENT:  Has not been drinking much, feels very dry.  CARDIAC/RESPIRATORY:  No chest pains, palpitations, syncope, cough,  wheezing, shortness of breath.  GASTROINTESTINAL:  As in HPI.  He is still  very gassy.  MUSCULOSKELETAL:  He has severe osteoarthritis of the right hip  and needs a cane to walk.  SKIN:  No rashes.   FAMILY HISTORY:  Pertinent for diabetes and coronary disease.   PHYSICAL EXAMINATION:  VITAL SIGNS:  Weight 178, temperature 98.2, pulse 72,  respirations 16, blood pressure 100/70.  GENERAL:  The patient is an elderly white male in no acute distress.  SKIN:  Appears somewhat yellowish tinged.  Warm and dry with a yellowish  hugh.  HEENT:  Tympanic membranes are gray with good landmarks bilaterally.  Oropharynx is dry, sclerae are somewhat icteric on the palate.  NECK:  Supple, no JVD, no carotid bruits, no thyromegaly, no  lymphadenopathy.  HEART:  Paced rhythm, distant heart sounds.  LUNGS:  Clear to auscultation and percussion bilaterally.  EXTREMITIES:  No edema.  Dorsalis pedis pulse 1+ bilaterally.  ABDOMEN:  Bowel sounds active, tender whole upper abdomen, no pain in the  lower abdomen, no  hepatosplenomegaly.  RECTAL:  Positive external hemorrhoids, good tone, brown stool, guaiac  negative.  He does have a scar from a pilonidal cyst.  EXTREMITIES:  Gait:  Uses a cane, limps.  Has difficulty getting around  secondary to the hip.  NEUROLOGIC:  Mood is good.  The patient is alert and oriented x3.  Very  pleasant.   LABORATORY DATA:  EKG shows a paced rhythm, bundle branch block.  Urinalysis:  Urine is orange colored.  Cannot read the rest of the  urinalysis.  It looks like bilirubin.  Microscopic showed some coarse  granular casts, some renal tubular cells, 3 to 5 white blood cells, 1 to 3  red blood cells, trace bacteria.   IMPRESSION:  1. Nausea, vomiting, resolving, but with abdominal pain, possible gallstone     or other obstruction.  Case was discussed with Dr. Arlyce Dice.  We will admit     the patient, get laboratory studies, amylase, lipase, we will get an     ultrasound of the abdomen to rule out obstruction.  Case was also     discussed with Dr. Kriste Basque on call for primary care, and also made Dr. Daleen Squibb     aware, however, doubt any of this is cardiac.  We will place the patient     on Demerol and Phenergan as needed.  2. Coronary artery disease.  The patient has a pacemaker.  Continue on     atenolol.  We will hold Pravachol for now.  Continue aspirin.  We will     give IV fluids 0.9 normal saline at 75 cc an hour.                                                Angelena Sole, M.D. [facility]    AMK/MEDQ  D:  01/07/2003  T:  01/08/2003  Job:  [id number]

## 2010-09-28 NOTE — Discharge Summary (Signed)
[name], [name]                             ACCOUNT NO.:  [id number]   MEDICAL RECORD NO.:  [id number]                   PATIENT TYPE:  INP   LOCATION:  4703                                 FACILITY:  [facility]   PHYSICIAN:  Rene Paci, M.D. [facility]          DATE OF BIRTH:  10-23-1925   DATE OF ADMISSION:  01/07/2003  DATE OF DISCHARGE:  01/11/2003                                 DISCHARGE SUMMARY   DISCHARGE DIAGNOSES:  1. Acute nausea and vomiting secondary to gallstone pancreatitis, improved.  2. Cholelithiasis with calcification status post laparoscopic     cholecystectomy, normal intraoperative cholangiogram August 29.  3. History of hypertension.  4. History of coronary artery disease, status post bypass 1999, cardiac     catheterization with percutaneous transluminal coronary angioplasty 2000.  5. History of hypercholesterolemia.   DISCHARGE MEDICATIONS:  Discharge medications are as prior to admission and  include:  1. Aspirin 81 mg p.o. q.d.  2. Atenolol 50 mg p.o. q.d.  3. Aciphex 20 mg p.o. q.d.  4. Pravachol 40 mg p.o. q.d.  5. The patient may also take Vicodin 5/500 one to two tablets p.o. q.4h.     p.r.n. pain.   FOLLOW UP:  With Dr. Vikki Ports in approximately 7 to 10 days  after discharge and with his primary care physician, Dr. Lutricia Horsfall, on an as  needed basis.   CONDITION ON DISCHARGE:  Medically improved and stable.   BRIEF HOSPITAL COURSE:  1. Nausea and vomiting. The patient presented to his primary care     physician's office the day of admission with two days of nausea and     vomiting with increasing symptoms and worsening pain. He was sent to the     inpatient setting for further evaluation and was found to have enzymes     values consistent with pancreatitis, presumably secondary to gallstones,     and this was confirmed by ultrasound. GI consult was obtained by Dr.     Melvia Heaps who felt that because the LFTs, amylase, and lipase  were     all decreasing with conservation observation a surgical consult was     warranted rather than an ERCP. Dr. Luan Pulling observed and evaluated     patient and performed a laparoscopic cholecystectomy on August 29 as     described above. On the day prior to discharge, he was advanced on his     diet, and narcotics were changed to oral as needed. He is tolerating a     diet without difficulty, minimal pain, and nearly normalized enzymes. It     is anticipated that as the gallstones have been removed there were will     be no further recurrence of his pancreatitis. It is safe to resume all     medications as prior to admission.  2. Other medical issues. His hypertension, coronary disease, and  GERD were     managed as prior to admission. No medication changes were made in the     regimen.                                               Rene Paci, M.D. [facility]   VL/MEDQ  D:  01/10/2003  T:  01/10/2003  Job:  [id number]

## 2010-10-13 ENCOUNTER — Other Ambulatory Visit: Payer: Self-pay | Admitting: Internal Medicine

## 2010-10-16 ENCOUNTER — Other Ambulatory Visit: Payer: Self-pay | Admitting: Internal Medicine

## 2010-10-16 NOTE — Telephone Encounter (Signed)
Who checks pts PT?

## 2010-10-16 NOTE — Telephone Encounter (Signed)
advise patient 1. Coumadin needs to be RF by the physician following up on that 2. He is due for a routine office visit

## 2010-10-16 NOTE — Telephone Encounter (Signed)
Spoke w/ pt he states Dr.Paz is his attending Dr to his coumadin, he is coming in Friday for f/u w/ Dr.Paz ant PT check.

## 2010-10-17 ENCOUNTER — Encounter: Payer: Self-pay | Admitting: Cardiology

## 2010-10-18 ENCOUNTER — Encounter: Payer: Self-pay | Admitting: Internal Medicine

## 2010-10-19 ENCOUNTER — Other Ambulatory Visit: Payer: Self-pay | Admitting: Internal Medicine

## 2010-10-19 ENCOUNTER — Ambulatory Visit (INDEPENDENT_AMBULATORY_CARE_PROVIDER_SITE_OTHER): Payer: Medicare Other | Admitting: Internal Medicine

## 2010-10-19 ENCOUNTER — Encounter: Payer: Self-pay | Admitting: Internal Medicine

## 2010-10-19 DIAGNOSIS — E119 Type 2 diabetes mellitus without complications: Secondary | ICD-10-CM

## 2010-10-19 DIAGNOSIS — Z7901 Long term (current) use of anticoagulants: Secondary | ICD-10-CM

## 2010-10-19 DIAGNOSIS — E039 Hypothyroidism, unspecified: Secondary | ICD-10-CM

## 2010-10-19 DIAGNOSIS — E785 Hyperlipidemia, unspecified: Secondary | ICD-10-CM

## 2010-10-19 DIAGNOSIS — M899 Disorder of bone, unspecified: Secondary | ICD-10-CM

## 2010-10-19 DIAGNOSIS — M949 Disorder of cartilage, unspecified: Secondary | ICD-10-CM

## 2010-10-19 DIAGNOSIS — I4891 Unspecified atrial fibrillation: Secondary | ICD-10-CM

## 2010-10-19 DIAGNOSIS — I10 Essential (primary) hypertension: Secondary | ICD-10-CM

## 2010-10-19 LAB — BASIC METABOLIC PANEL
CO2: 21 mEq/L (ref 19–32)
Chloride: 108 mEq/L (ref 96–112)
Potassium: 3.9 mEq/L (ref 3.5–5.3)
Sodium: 136 mEq/L (ref 135–145)

## 2010-10-19 LAB — CBC WITH DIFFERENTIAL/PLATELET
Hemoglobin: 12.2 g/dL — ABNORMAL LOW (ref 13.0–17.0)
Lymphocytes Relative: 17 % (ref 12–46)
Lymphs Abs: 1 10*3/uL (ref 0.7–4.0)
MCV: 96.8 fL (ref 78.0–100.0)
Monocytes Relative: 10 % (ref 3–12)
Neutrophils Relative %: 70 % (ref 43–77)
Platelets: 199 10*3/uL (ref 150–400)
RBC: 3.78 MIL/uL — ABNORMAL LOW (ref 4.22–5.81)
WBC: 5.6 10*3/uL (ref 4.0–10.5)

## 2010-10-19 LAB — POCT INR: INR: 2.1

## 2010-10-19 NOTE — Patient Instructions (Addendum)
Same coumadin, came back in one month for another coumadin Let us know the strength of synthroid you are on Diet! Exercise!

## 2010-10-19 NOTE — Progress Notes (Signed)
  Subjective:    Patient ID: [name], male    DOB: [birth year], 77 y.o.   MRN: [id number]  HPI DM-- good med compliance, no recent amb CBG Osteopenia-- s/p ergocalciferol, not on OTCs except occ Tums Thyroid-- synthroid dose adjusted base on last TSH to 88 mcg , apparently still taking same dose (75)    Past Medical History  Diagnosis Date  . Diabetes mellitus, type 2   . CAD (coronary artery disease)   . Hypertension   . Hyperlipidemia   . Atrial fibrillation   . AAA (abdominal aortic aneurysm)     next u/s due 03-2012  . Hypothyroidism   . BPH (benign prostatic hyperplasia)     reports aprocedure (TURP) remotely in [place].Marland KitchenNo futher f/u w/ urology  . GERD (gastroesophageal reflux disease)   . Insomnia     transient  . Vitamin B12 deficiency   . Osteopenia     dexa 2-11   Past Surgical History  Procedure Date  . Pacemaker 1980, 1995    medtronic minix [id number]  . Cholecystectomy, laparoscopic   . Prostatectomy     transurethral  . Inguinal herniorrhaphies     bilateral  . Right hip replacement   . Coronary artery bypass graft     1999 stents in 2000  . Penile prosthesis implant 1992      Review of Systems  Respiratory: Negative for cough and shortness of breath.   Cardiovascular: Negative for chest pain, palpitations and leg swelling.  Gastrointestinal: Negative for nausea, vomiting and abdominal pain.       Objective:   Physical Exam  Constitutional: He is oriented to person, place, and time. He appears well-developed and well-nourished. No distress.  HENT:  Head: Normocephalic and atraumatic.  Cardiovascular: Normal rate, regular rhythm and normal heart sounds.   Pulmonary/Chest: Effort normal and breath sounds normal. No respiratory distress. He has no wheezes. He has no rales.  Musculoskeletal: He exhibits no edema.  Neurological: He is alert and oriented to person, place, and time.  Skin: Skin is warm and dry.  Psychiatric: He has a normal mood and  affect. His behavior is normal. Judgment and thought content normal.          Assessment & Plan:

## 2010-10-19 NOTE — Assessment & Plan Note (Signed)
Well controlled, labs  

## 2010-10-19 NOTE — Assessment & Plan Note (Addendum)
Last TSH ~3.3, recommended to incrase synthroid to get TSH slt lower but appaently still on : labs, will ask pt to clarify dose .

## 2010-10-19 NOTE — Assessment & Plan Note (Addendum)
On coumadin, INR ok today but has not return to let us check it  Regulalrly. He is reluctant to come on monthly because "the Coumadin is always okay". I discussed with him the risk of bleeding, excessive or under coagulation , I strongly recommend him to come back in a month and then monthly

## 2010-10-19 NOTE — Assessment & Plan Note (Signed)
Labs , reminded about yearly eye check

## 2010-10-19 NOTE — Assessment & Plan Note (Addendum)
S/p ergocalciferol, rec oscal, exercise! Labs

## 2010-10-19 NOTE — Assessment & Plan Note (Addendum)
LDL @ goal, triglycerides slightly elevated, HDL slightly low. He is on a top dose of Pravachol and fenofibrate. In the past he stopped zetia due to cost Encourage diet and remain active. At some point his cholesterol needs to be rechecked .

## 2010-10-20 ENCOUNTER — Other Ambulatory Visit: Payer: Self-pay | Admitting: Internal Medicine

## 2010-10-20 LAB — HEMOGLOBIN A1C: Mean Plasma Glucose: 157 mg/dL — ABNORMAL HIGH (ref <117)

## 2010-10-22 ENCOUNTER — Other Ambulatory Visit: Payer: Self-pay | Admitting: *Deleted

## 2010-10-22 MED ORDER — WARFARIN SODIUM 3 MG PO TABS: ORAL_TABLET | ORAL | Status: DC

## 2010-10-23 ENCOUNTER — Telehealth: Payer: Self-pay | Admitting: *Deleted

## 2010-10-23 NOTE — Telephone Encounter (Signed)
Message left for patient to return my call.  

## 2010-10-23 NOTE — Telephone Encounter (Signed)
Message copied by Leanne Lovely on Tue Oct 23, 2010  4:39 PM ------      Message from: Wanda Plump      Created: Tue Oct 23, 2010  4:34 PM       advise patient, his vitamin D is back to normal.       Good results, continue with over-the-counter vitamin D

## 2010-10-23 NOTE — Telephone Encounter (Signed)
Message copied by Leanne Lovely on Tue Oct 23, 2010  4:43 PM ------      Message from: [name]      Created: Tue Oct 23, 2010  4:41 PM       (hemoglobin is slightly low, chart reviewed, last colonoscopy 12/2008 which showed diverticula)      Advise patient:      Good results, diabetes is stable and slightly better.      hemoglobin is slightly low, we will recheck in  Few months.

## 2010-10-24 NOTE — Telephone Encounter (Signed)
Pt is aware, mailed pt a copy.

## 2010-11-19 ENCOUNTER — Ambulatory Visit (INDEPENDENT_AMBULATORY_CARE_PROVIDER_SITE_OTHER): Payer: Medicare Other | Admitting: *Deleted

## 2010-11-19 DIAGNOSIS — I4891 Unspecified atrial fibrillation: Secondary | ICD-10-CM

## 2010-11-19 DIAGNOSIS — Z7901 Long term (current) use of anticoagulants: Secondary | ICD-10-CM

## 2010-11-19 LAB — POCT INR: INR: 3.2

## 2010-11-20 ENCOUNTER — Encounter: Payer: Self-pay | Admitting: Cardiology

## 2010-11-20 ENCOUNTER — Ambulatory Visit (INDEPENDENT_AMBULATORY_CARE_PROVIDER_SITE_OTHER): Payer: Medicare Other | Admitting: Cardiology

## 2010-11-20 VITALS — BP 118/78 | HR 76 | Resp 18 | Ht 67.0 in | Wt 188.8 lb

## 2010-11-20 DIAGNOSIS — I2510 Atherosclerotic heart disease of native coronary artery without angina pectoris: Secondary | ICD-10-CM

## 2010-11-20 DIAGNOSIS — I4891 Unspecified atrial fibrillation: Secondary | ICD-10-CM

## 2010-11-20 DIAGNOSIS — I10 Essential (primary) hypertension: Secondary | ICD-10-CM

## 2010-11-20 DIAGNOSIS — E785 Hyperlipidemia, unspecified: Secondary | ICD-10-CM

## 2010-11-20 NOTE — Patient Instructions (Signed)
Your physician recommends that you schedule a follow-up appointment in: 1 year  

## 2010-11-20 NOTE — Patient Instructions (Addendum)
Pt informed will call if change. Pls advise --------------------------------------- INR higher than usual without any change on Coumadin. Recheck in 2 weeks. No change.  [facility]

## 2010-12-12 NOTE — Assessment & Plan Note (Signed)
No current symptoms.  Seems to be doing well.  No specific progression.  Would continue current medications.

## 2010-12-12 NOTE — Progress Notes (Signed)
HPI:  He had a cold that lasted about three weeks, but overall is getting along well.  He denies any chest pain. He feels good in general.    Current Outpatient Prescriptions  Medication Sig Dispense Refill  . ALPRAZolam (XANAX) 0.25 MG tablet Take 0.25 mg by mouth. 1/2 tab 1 hour before flying       . amLODipine (NORVASC) 5 MG tablet Take 5 mg by mouth daily.        . Aspirin (BAYER LOW STRENGTH PO) Take by mouth daily.        [name] atenolol (TENORMIN) 50 MG tablet Take by mouth 2 (two) times daily.       . Blood Glucose Monitoring Suppl (ONE TOUCH BASIC SYSTEM) W/DEVICE KIT by Does not apply route daily.        . Calcium Carbonate (CALCIUM 600) 1500 MG TABS Take by mouth daily.        . fenofibrate micronized (LOFIBRA) 134 MG capsule Take 134 mg by mouth daily.        [name] glucose blood test strip 1 each by Other route daily. Use as instructed       . Levothyroxine Sodium 88 MCG CAPS Take by mouth daily.        . metFORMIN (GLUCOPHAGE) 850 MG tablet TAKE ONE TABLET BY MOUTH TWICE DAILY  60 tablet  1  . nitroGLYCERIN (NITROSTAT) 0.4 MG SL tablet Place 0.4 mg under the tongue every 5 (five) minutes as needed.        [name] omeprazole (PRILOSEC) 40 MG capsule Take 40 mg by mouth daily.        . pravastatin (PRAVACHOL) 40 MG tablet TAKE TWO TABLETS BY MOUTH EVERY DAY  60 tablet  3  . warfarin (COUMADIN) 3 MG tablet TAKE AS DIRECTED  60 tablet  2    Allergies  Allergen Reactions  . Ace Inhibitors     REACTION: cough  . Codeine Phosphate     REACTION: nausea  . Hydrochlorothiazide W/Triamterene     REACTION: cramps    Past Medical History  Diagnosis Date  . Diabetes mellitus, type 2   . CAD (coronary artery disease)   . Hypertension   . Hyperlipidemia   . Atrial fibrillation   . AAA (abdominal aortic aneurysm)     next u/s due 03-2012  . Hypothyroidism   . BPH (benign prostatic hyperplasia)     reports aprocedure (TURP) remotely in [place].Marland KitchenNo futher f/u w/ urology  . GERD (gastroesophageal  reflux disease)   . Insomnia     transient  . Vitamin B12 deficiency   . Osteopenia     dexa 2-11    Past Surgical History  Procedure Date  . Pacemaker 1980, 1995    medtronic minix [id number]  . Cholecystectomy, laparoscopic   . Prostatectomy     transurethral  . Inguinal herniorrhaphies     bilateral  . Right hip replacement   . Coronary artery bypass graft     1999 stents in 2000  . Penile prosthesis implant 1992    Family History  Problem Relation Age of Onset  . Alzheimer's disease Mother   . Heart failure Father   . Heart disease Brother     maybe  . Diabetes Other     several siblings  . Coronary artery disease Brother     cabg  . Alzheimer's disease Sister     History   Social History  . Marital Status: Legally Separated  Spouse Name: N/A    Number of Children: N/A  . Years of Education: N/A   Occupational History  . Not on file.   Social History Main Topics  . Smoking status: Never Smoker   . Smokeless tobacco: Not on file  . Alcohol Use: Yes     rare  . Drug Use: No  . Sexually Active: Not on file   Other Topics Concern  . Not on file   Social History Narrative   Lives by self, independent on ADL, retired. Separated from wife. Daughter, [name], lives in same neighborhood. Does not regularly exercise. Daily Caffeine Use-2 cups daily. Diet-improved lately.      ROS: Please see the HPI.  All other systems reviewed and negative.  PHYSICAL EXAM:  BP 118/78  Pulse 76  Resp 18  Ht 5\' 7"  (1.702 m)  Wt 188 lb 12.8 oz (85.639 kg)  BMI 29.57 kg/m2  General: Well developed, well nourished, in no acute distress. Head:  Normocephalic and atraumatic. Neck: no JVD.  Pacer site is stable.  Lungs: Clear to auscultation and percussion. Heart: Normal S1 and S2.  No murmur, rubs or gallops.  Abdomen:  Normal bowel sounds; soft; non tender; no organomegaly Pulses: Pulses normal in all 4 extremities. Extremities: No clubbing or cyanosis. No  edema. Neurologic: Alert and oriented x 3.  EKG:  Atrial fib competing with ventricular pacing.    ASSESSMENT AND PLAN:

## 2010-12-12 NOTE — Assessment & Plan Note (Signed)
Rate is controlled.  Nothing new to add.  Feels good overall.  AF with backup pacing.  On warfarin.  Tolerates well.  Precautions for bleeding reminded.

## 2010-12-12 NOTE — Assessment & Plan Note (Signed)
Followed by Dr. Drue Novel.  On combo therapy.   ? Prediabetic or metabolic pattern.

## 2010-12-12 NOTE — Assessment & Plan Note (Signed)
Controlled at present.  

## 2010-12-18 ENCOUNTER — Telehealth: Payer: Self-pay

## 2010-12-18 NOTE — Telephone Encounter (Signed)
Patient's dentist office called to see if patient could d/c coumadin/warfarin prior to tooth extraction  Dr.Paz please advise

## 2010-12-19 NOTE — Telephone Encounter (Signed)
Okay with me, please forward this message to Dr. Rosalyn Charters nurse please, I like to be sure he is ok as well

## 2010-12-21 ENCOUNTER — Ambulatory Visit (INDEPENDENT_AMBULATORY_CARE_PROVIDER_SITE_OTHER): Payer: Medicare Other | Admitting: *Deleted

## 2010-12-21 DIAGNOSIS — Z7901 Long term (current) use of anticoagulants: Secondary | ICD-10-CM

## 2010-12-21 DIAGNOSIS — I4891 Unspecified atrial fibrillation: Secondary | ICD-10-CM

## 2010-12-21 LAB — POCT INR: INR: 1.2

## 2010-12-21 NOTE — Telephone Encounter (Signed)
See anticoag visit today

## 2010-12-21 NOTE — Patient Instructions (Addendum)
Pt says last dose was taking on Wed. and dentist would like to have it at 2.0 needs address today having dental procedure done on Mon at 8:30am. -------------------------------- To take 3 mg today, then skip until Monday, restart Coumadin immediately after the procedure and come back in 2 weeks for a Coumadin check [facility]  Left detailed information on machine for pt will check to see if he got instructions. Doristine Devoid

## 2010-12-21 NOTE — Telephone Encounter (Signed)
Please advise pt is having dental procedure on Mon. 8:30

## 2010-12-21 NOTE — Telephone Encounter (Signed)
Called cardiology and was informed that Dr. [name] and his assistant is out of office today.

## 2010-12-31 ENCOUNTER — Telehealth: Payer: Self-pay | Admitting: Internal Medicine

## 2010-12-31 NOTE — Telephone Encounter (Signed)
Discontinue 3 days before surgery. Restart the day after INR 2 weeks after the surgery

## 2010-12-31 NOTE — Telephone Encounter (Signed)
Please advise if coumadin needs to be d/c.

## 2011-01-01 ENCOUNTER — Ambulatory Visit (INDEPENDENT_AMBULATORY_CARE_PROVIDER_SITE_OTHER): Payer: Medicare Other | Admitting: Internal Medicine

## 2011-01-01 ENCOUNTER — Encounter: Payer: Self-pay | Admitting: Internal Medicine

## 2011-01-01 DIAGNOSIS — Z950 Presence of cardiac pacemaker: Secondary | ICD-10-CM

## 2011-01-01 DIAGNOSIS — I4891 Unspecified atrial fibrillation: Secondary | ICD-10-CM

## 2011-01-01 LAB — PACEMAKER DEVICE OBSERVATION

## 2011-01-01 NOTE — Assessment & Plan Note (Signed)
His device is working normally. He will follow up in several months. 

## 2011-01-01 NOTE — Patient Instructions (Signed)
Your physician recommends that you schedule a follow-up appointment in: 3 months device clinic and 6 months with Dr Ladona Ridgel

## 2011-01-01 NOTE — Progress Notes (Signed)
HPI [name] returns today for followup. He has a h/o HTN, symptomatic bradycardia s/p PPM, PAF, DM, and dyslipidemia. He denies c/p, sob, or peripheral edema. He remains active. Allergies  Allergen Reactions  . Ace Inhibitors     REACTION: cough  . Codeine Phosphate     REACTION: nausea  . Hydrochlorothiazide W/Triamterene     REACTION: cramps     Current Outpatient Prescriptions  Medication Sig Dispense Refill  . ALPRAZolam (XANAX) 0.25 MG tablet Take 0.25 mg by mouth. 1/2 tab 1 hour before flying       . amLODipine (NORVASC) 5 MG tablet Take 5 mg by mouth daily.        . Aspirin (BAYER LOW STRENGTH PO) Take by mouth daily.        [name] atenolol (TENORMIN) 50 MG tablet Take by mouth 2 (two) times daily.       . Calcium Carbonate (CALCIUM 600) 1500 MG TABS Take by mouth as needed.       . fenofibrate micronized (LOFIBRA) 134 MG capsule Take 134 mg by mouth daily.        . Levothyroxine Sodium 88 MCG CAPS Take by mouth daily.        . metFORMIN (GLUCOPHAGE) 850 MG tablet TAKE ONE TABLET BY MOUTH TWICE DAILY  60 tablet  1  . nitroGLYCERIN (NITROSTAT) 0.4 MG SL tablet Place 0.4 mg under the tongue every 5 (five) minutes as needed.        [name] omeprazole (PRILOSEC) 40 MG capsule Take 40 mg by mouth daily.        . pravastatin (PRAVACHOL) 40 MG tablet TAKE TWO TABLETS BY MOUTH EVERY DAY  60 tablet  3  . warfarin (COUMADIN) 3 MG tablet TAKE AS DIRECTED  60 tablet  2     Past Medical History  Diagnosis Date  . Diabetes mellitus, type 2   . CAD (coronary artery disease)   . Hypertension   . Hyperlipidemia   . Atrial fibrillation   . AAA (abdominal aortic aneurysm)     next u/s due 03-2012  . Hypothyroidism   . BPH (benign prostatic hyperplasia)     reports aprocedure (TURP) remotely in [place].Marland KitchenNo futher f/u w/ urology  . GERD (gastroesophageal reflux disease)   . Insomnia     transient  . Vitamin B12 deficiency   . Osteopenia     dexa 2-11    ROS:   All systems reviewed and negative  except as noted in the HPI.   Past Surgical History  Procedure Date  . Pacemaker 1980, 1995    medtronic minix [id number]  . Cholecystectomy, laparoscopic   . Prostatectomy     transurethral  . Inguinal herniorrhaphies     bilateral  . Right hip replacement   . Coronary artery bypass graft     1999 stents in 2000  . Penile prosthesis implant 1992     Family History  Problem Relation Age of Onset  . Alzheimer's disease Mother   . Heart failure Father   . Heart disease Brother     maybe  . Diabetes Other     several siblings  . Coronary artery disease Brother     cabg  . Alzheimer's disease Sister      History   Social History  . Marital Status: Legally Separated    Spouse Name: N/A    Number of Children: N/A  . Years of Education: N/A   Occupational History  . Not  on file.   Social History Main Topics  . Smoking status: Never Smoker   . Smokeless tobacco: Not on file  . Alcohol Use: Yes     rare  . Drug Use: No  . Sexually Active: Not on file   Other Topics Concern  . Not on file   Social History Narrative   Lives by self, independent on ADL, retired. Separated from wife. Daughter, [name], lives in same neighborhood. Does not regularly exercise. Daily Caffeine Use-2 cups daily. Diet-improved lately.       BP 126/69  Pulse 65  Ht 5\' 7"  (1.702 m)  Wt 186 lb 6.4 oz (84.55 kg)  BMI 29.19 kg/m2  Physical Exam:  Well appearing, elderly man,  NAD HEENT: Unremarkable Neck:  No JVD, no thyromegally Lymphatics:  No adenopathy Back:  No CVA tenderness Lungs:  Clear with no wheezes, rales, or rhonchi HEART:  Iregular rate rhythm, no murmurs, no rubs, no clicks Abd:  soft, positive bowel sounds, no organomegally, no rebound, no guarding Ext:  2 plus pulses, no edema, no cyanosis, no clubbing Skin:  No rashes no nodules Neuro:  CN II through XII intact, motor grossly intact  DEVICE  Normal device function.  See PaceArt for details.   Assess/Plan:

## 2011-01-01 NOTE — Telephone Encounter (Signed)
Left msg for pt to return call.

## 2011-01-01 NOTE — Assessment & Plan Note (Signed)
His ventricular rate appears to be well-controlled. He will continue his current medical therapy. 

## 2011-01-07 NOTE — Telephone Encounter (Signed)
Spoke w/ pt aware of instructions has pt/inr checked tomorrow

## 2011-01-08 ENCOUNTER — Ambulatory Visit (INDEPENDENT_AMBULATORY_CARE_PROVIDER_SITE_OTHER): Payer: Medicare Other | Admitting: *Deleted

## 2011-01-08 DIAGNOSIS — I4891 Unspecified atrial fibrillation: Secondary | ICD-10-CM

## 2011-01-08 DIAGNOSIS — Z7901 Long term (current) use of anticoagulants: Secondary | ICD-10-CM

## 2011-01-08 LAB — POCT INR: INR: 2.5

## 2011-01-08 NOTE — Patient Instructions (Addendum)
Pt aware to resume coumadin after surgery.  ----------------------------- Noted  [name]

## 2011-01-26 ENCOUNTER — Other Ambulatory Visit: Payer: Self-pay | Admitting: Cardiology

## 2011-01-26 ENCOUNTER — Other Ambulatory Visit: Payer: Self-pay | Admitting: Internal Medicine

## 2011-01-28 NOTE — Telephone Encounter (Signed)
rx request for alprazolam. Pt last seen 10/15/10.

## 2011-01-30 NOTE — Telephone Encounter (Signed)
Called refill into pharmacy spoke with Fraser Din ok #20 only. Updated EPIC.Marland KitchenMarland Kitchen9/19/12@1 :05pm/LMB

## 2011-01-30 NOTE — Telephone Encounter (Signed)
Ok #20, no RF 

## 2011-02-01 ENCOUNTER — Other Ambulatory Visit: Payer: Self-pay | Admitting: Cardiology

## 2011-02-08 ENCOUNTER — Other Ambulatory Visit: Payer: Self-pay | Admitting: Internal Medicine

## 2011-02-18 ENCOUNTER — Ambulatory Visit: Payer: Medicare Other | Admitting: Internal Medicine

## 2011-02-18 ENCOUNTER — Telehealth: Payer: Self-pay | Admitting: Internal Medicine

## 2011-02-18 NOTE — Telephone Encounter (Signed)
He has not checked his Coumadin in > than a month, please arrange

## 2011-02-19 NOTE — Telephone Encounter (Signed)
LMOM for call back from patient to schedule nurse visit for INR/PT check and to clarify why he has not had this done at regular interval.

## 2011-02-22 LAB — COMPREHENSIVE METABOLIC PANEL
ALT: 22
AST: 20
Albumin: 3.6
Alkaline Phosphatase: 36 — ABNORMAL LOW
BUN: 14
CO2: 26
Calcium: 8.7
Chloride: 104
Creatinine, Ser: 1.08
GFR calc Af Amer: >60
GFR calc non Af Amer: >60
Glucose, Bld: 156 — ABNORMAL HIGH
Potassium: 4.5
Sodium: 138
Total Bilirubin: 0.8
Total Protein: 6.1

## 2011-02-22 LAB — HEPARIN LEVEL (UNFRACTIONATED)
Heparin Unfractionated: 0.7
Heparin Unfractionated: 0.72 — ABNORMAL HIGH

## 2011-02-22 LAB — POCT I-STAT CREATININE
Creatinine, Ser: 1
Operator id: 294501

## 2011-02-22 LAB — DIFFERENTIAL
Basophils Absolute: 0
Lymphocytes Relative: 9 — ABNORMAL LOW
Neutro Abs: 7.3
Neutrophils Relative %: 81 — ABNORMAL HIGH

## 2011-02-22 LAB — CK TOTAL AND CKMB (NOT AT ARMC)
CK, MB: 1.5
Relative Index: INVALID
Total CK: 69

## 2011-02-22 LAB — POCT CARDIAC MARKERS
CKMB, poc: <1 — ABNORMAL LOW
Myoglobin, poc: 72.7
Operator id: 294501
Troponin i, poc: <0.05

## 2011-02-22 LAB — HEMOGLOBIN A1C: Hgb A1c MFr Bld: 7.3 — ABNORMAL HIGH

## 2011-02-22 LAB — CBC
HCT: 38.2 — ABNORMAL LOW
HCT: 38.3 — ABNORMAL LOW
Hemoglobin: 13.3
Hemoglobin: 13.4
MCHC: 34.7
MCV: 96.3
MCV: 98.4
Platelets: 169
Platelets: 185
Platelets: 188
RBC: 3.9 — ABNORMAL LOW
RDW: 13.1
RDW: 13.1
WBC: 5.5
WBC: 5.6

## 2011-02-22 LAB — I-STAT 8, (EC8 V) (CONVERTED LAB)
Acid-base deficit: 3 — ABNORMAL HIGH
Chloride: 101
Glucose, Bld: 185 — ABNORMAL HIGH
TCO2: 24
pCO2, Ven: 43.9 — ABNORMAL LOW
pH, Ven: 7.323 — ABNORMAL HIGH

## 2011-02-22 LAB — CARDIAC PANEL(CRET KIN+CKTOT+MB+TROPI)
CK, MB: 1.9
CK, MB: 2.1
Relative Index: INVALID
Relative Index: INVALID
Total CK: 79
Total CK: 95
Troponin I: 0.02
Troponin I: <0.01

## 2011-02-22 LAB — T4, FREE: Free T4: 0.99

## 2011-02-22 LAB — T3, FREE: T3, Free: 3.5 (ref 2.3–4.2)

## 2011-02-22 LAB — B-NATRIURETIC PEPTIDE (CONVERTED LAB): Pro B Natriuretic peptide (BNP): 112 — ABNORMAL HIGH

## 2011-02-22 LAB — TSH: TSH: 7.76 — ABNORMAL HIGH

## 2011-02-22 LAB — PROTIME-INR
INR: 1.4
Prothrombin Time: 17.7 — ABNORMAL HIGH

## 2011-02-22 LAB — APTT: aPTT: 114 — ABNORMAL HIGH

## 2011-02-27 ENCOUNTER — Other Ambulatory Visit: Payer: Self-pay | Admitting: Internal Medicine

## 2011-02-27 MED ORDER — METFORMIN HCL 850 MG PO TABS: 850.0000 mg | ORAL_TABLET | Freq: Every day | ORAL | Status: DC

## 2011-02-27 NOTE — Telephone Encounter (Signed)
Metformin 850 mg request for 90 day supply. Last OV: 05/17/2010 Last INR: 01/08/11 Please advise.

## 2011-02-28 ENCOUNTER — Ambulatory Visit: Payer: Medicare Other

## 2011-03-01 ENCOUNTER — Ambulatory Visit (INDEPENDENT_AMBULATORY_CARE_PROVIDER_SITE_OTHER): Payer: Medicare Other | Admitting: Internal Medicine

## 2011-03-01 ENCOUNTER — Encounter: Payer: Self-pay | Admitting: Internal Medicine

## 2011-03-01 VITALS — BP 112/78 | HR 83 | Temp 97.7°F | Resp 16 | Wt 183.0 lb

## 2011-03-01 DIAGNOSIS — E785 Hyperlipidemia, unspecified: Secondary | ICD-10-CM

## 2011-03-01 DIAGNOSIS — I4891 Unspecified atrial fibrillation: Secondary | ICD-10-CM

## 2011-03-01 DIAGNOSIS — E039 Hypothyroidism, unspecified: Secondary | ICD-10-CM

## 2011-03-01 DIAGNOSIS — I10 Essential (primary) hypertension: Secondary | ICD-10-CM

## 2011-03-01 DIAGNOSIS — E119 Type 2 diabetes mellitus without complications: Secondary | ICD-10-CM

## 2011-03-01 DIAGNOSIS — D509 Iron deficiency anemia, unspecified: Secondary | ICD-10-CM

## 2011-03-01 DIAGNOSIS — Z23 Encounter for immunization: Secondary | ICD-10-CM

## 2011-03-01 DIAGNOSIS — E538 Deficiency of other specified B group vitamins: Secondary | ICD-10-CM

## 2011-03-01 NOTE — Assessment & Plan Note (Signed)
Has not taken shots in a year, reason? Labs, restart shots?

## 2011-03-01 NOTE — Assessment & Plan Note (Signed)
Reports good compliance with meds, labs

## 2011-03-01 NOTE — Progress Notes (Signed)
  Subjective:    Patient ID: [name], male    DOB: [birth year], 77 y.o.   MRN: [id number]  HPI Routine office visit, several issues Coumadin therapy--needs his INR checked Hypothyroidism--good compliance with Synthroid 88 mcg High cholesterol--reports good compliance with both medicine, not fasting Hypertension-- good medication compliance, no ambulatory BPs. Atrial fibrillation--sawDr. Ladona Ridgel 2 months ago,note reviewed, good reports. Diabetes--sometimes forgets the second dose of metformin. Request a flu shot  Past Medical History  Diagnosis Date  . Diabetes mellitus, type 2   . CAD (coronary artery disease)   . Hypertension   . Hyperlipidemia   . Atrial fibrillation   . AAA (abdominal aortic aneurysm)     next u/s due 03-2012  . Hypothyroidism   . BPH (benign prostatic hyperplasia)     reports aprocedure (TURP) remotely in [place].Marland KitchenNo futher f/u w/ urology  . GERD (gastroesophageal reflux disease)   . Insomnia     transient  . Vitamin B12 deficiency   . Osteopenia     dexa 2-11   Past Surgical History  Procedure Date  . Pacemaker 1980, 1995    medtronic minix [id number]  . Cholecystectomy, laparoscopic   . Prostatectomy     transurethral  . Inguinal herniorrhaphies     bilateral  . Right hip replacement   . Coronary artery bypass graft     1999 stents in 2000  . Penile prosthesis implant 1992   Review of Systems Denies chest pain or shortness of breath No nausea, vomiting, diarrhea or blood in the stools.     Objective:   Physical Exam  Constitutional: He is oriented to person, place, and time. He appears well-developed and well-nourished.  HENT:  Head: Normocephalic and atraumatic.  Cardiovascular:       Irregular  Pulmonary/Chest: Effort normal and breath sounds normal. No respiratory distress. He has no wheezes. He has no rales.  Musculoskeletal: He exhibits no edema.  Neurological: He is alert and oriented to person, place, and time.      Assessment &  Plan:

## 2011-03-01 NOTE — Patient Instructions (Addendum)
Come back fasting next week: BMP---dx  hypertension Hemoglobin--- dx  anemia Hemoglobin A1c--- dx  diabetes TSH --- dx hypothyroidism FLP--- dx hyperlipidemia Vitamin B12--- dx  B12 deficiency Same coumadin, please recheck in 2 weeks

## 2011-03-01 NOTE — Assessment & Plan Note (Signed)
No change 

## 2011-03-01 NOTE — Assessment & Plan Note (Addendum)
Stable INR 3.5 current : 3mg  qd, 4.5 mg Wednesday and Friday This dose has worked well for a while thus no change, recheck 2 weeks

## 2011-03-01 NOTE — Assessment & Plan Note (Signed)
At some point had iron deficiency anemia, saw GI 2010 had onoscopy which showed diverticula and hemorrhoids. Iron deficient resolved after supplementation. Last hemoglobin is slightly low, recheck

## 2011-03-01 NOTE — Assessment & Plan Note (Signed)
Good compliance with medication, labs, see instructions

## 2011-03-01 NOTE — Assessment & Plan Note (Signed)
Sometimes forgets the second dose of metformin, noncompliance, labs

## 2011-03-04 ENCOUNTER — Other Ambulatory Visit: Payer: Self-pay | Admitting: Internal Medicine

## 2011-03-04 DIAGNOSIS — E785 Hyperlipidemia, unspecified: Secondary | ICD-10-CM

## 2011-03-04 DIAGNOSIS — E538 Deficiency of other specified B group vitamins: Secondary | ICD-10-CM

## 2011-03-04 DIAGNOSIS — E119 Type 2 diabetes mellitus without complications: Secondary | ICD-10-CM

## 2011-03-04 DIAGNOSIS — E039 Hypothyroidism, unspecified: Secondary | ICD-10-CM

## 2011-03-04 DIAGNOSIS — D649 Anemia, unspecified: Secondary | ICD-10-CM

## 2011-03-04 DIAGNOSIS — I10 Essential (primary) hypertension: Secondary | ICD-10-CM

## 2011-03-05 ENCOUNTER — Other Ambulatory Visit (INDEPENDENT_AMBULATORY_CARE_PROVIDER_SITE_OTHER): Payer: Medicare Other

## 2011-03-05 DIAGNOSIS — I10 Essential (primary) hypertension: Secondary | ICD-10-CM

## 2011-03-05 DIAGNOSIS — E119 Type 2 diabetes mellitus without complications: Secondary | ICD-10-CM

## 2011-03-05 DIAGNOSIS — D649 Anemia, unspecified: Secondary | ICD-10-CM

## 2011-03-05 DIAGNOSIS — E785 Hyperlipidemia, unspecified: Secondary | ICD-10-CM

## 2011-03-05 DIAGNOSIS — E538 Deficiency of other specified B group vitamins: Secondary | ICD-10-CM

## 2011-03-05 DIAGNOSIS — E039 Hypothyroidism, unspecified: Secondary | ICD-10-CM

## 2011-03-05 LAB — LIPID PANEL
HDL: 35.7 mg/dL — ABNORMAL LOW (ref >39.00)
VLDL: 47 mg/dL — ABNORMAL HIGH (ref 0.0–40.0)

## 2011-03-05 LAB — VITAMIN B12: Vitamin B-12: 171 pg/mL — ABNORMAL LOW (ref 211–911)

## 2011-03-05 LAB — HEMOGLOBIN A1C: Hgb A1c MFr Bld: 7.3 % — ABNORMAL HIGH (ref 4.6–6.5)

## 2011-03-05 LAB — BASIC METABOLIC PANEL
CO2: 23 mEq/L (ref 19–32)
Calcium: 8.5 mg/dL (ref 8.4–10.5)
Creatinine, Ser: 1.3 mg/dL (ref 0.4–1.5)

## 2011-03-05 LAB — LDL CHOLESTEROL, DIRECT: Direct LDL: 104.4 mg/dL

## 2011-03-05 NOTE — Progress Notes (Signed)
Labs only

## 2011-03-15 ENCOUNTER — Ambulatory Visit (INDEPENDENT_AMBULATORY_CARE_PROVIDER_SITE_OTHER): Payer: Medicare Other | Admitting: *Deleted

## 2011-03-15 DIAGNOSIS — Z7901 Long term (current) use of anticoagulants: Secondary | ICD-10-CM

## 2011-03-15 DIAGNOSIS — I4891 Unspecified atrial fibrillation: Secondary | ICD-10-CM

## 2011-03-15 NOTE — Patient Instructions (Signed)
Return to office in 4 weeks for PT/INR   Continue current dose: 3 mg daily except 4.5 mg on Wednesday and Friday.   

## 2011-04-03 ENCOUNTER — Ambulatory Visit (INDEPENDENT_AMBULATORY_CARE_PROVIDER_SITE_OTHER): Payer: Medicare Other | Admitting: *Deleted

## 2011-04-03 DIAGNOSIS — I495 Sick sinus syndrome: Secondary | ICD-10-CM

## 2011-04-03 LAB — PACEMAKER DEVICE OBSERVATION: BRDY-0002RV: 60 {beats}/min

## 2011-04-03 NOTE — Progress Notes (Signed)
PPM check 

## 2011-04-23 ENCOUNTER — Ambulatory Visit (INDEPENDENT_AMBULATORY_CARE_PROVIDER_SITE_OTHER): Payer: Medicare Other | Admitting: *Deleted

## 2011-04-23 DIAGNOSIS — I4891 Unspecified atrial fibrillation: Secondary | ICD-10-CM

## 2011-04-23 DIAGNOSIS — Z7901 Long term (current) use of anticoagulants: Secondary | ICD-10-CM

## 2011-04-23 LAB — POCT INR: INR: 2.5

## 2011-04-23 NOTE — Patient Instructions (Signed)
Return to office in 4 weeks for PT/INR   Continue current dose: 3 mg daily except 4.5 mg on Wednesday and Friday.

## 2011-05-02 ENCOUNTER — Encounter: Payer: Self-pay | Admitting: Internal Medicine

## 2011-05-10 ENCOUNTER — Encounter: Payer: Self-pay | Admitting: Internal Medicine

## 2011-05-10 ENCOUNTER — Encounter: Payer: Self-pay | Admitting: *Deleted

## 2011-05-10 ENCOUNTER — Encounter: Payer: Medicare Other | Admitting: Internal Medicine

## 2011-05-10 ENCOUNTER — Ambulatory Visit (INDEPENDENT_AMBULATORY_CARE_PROVIDER_SITE_OTHER): Payer: Medicare Other | Admitting: Internal Medicine

## 2011-05-10 VITALS — BP 138/78 | Ht 67.0 in | Wt 185.0 lb

## 2011-05-10 DIAGNOSIS — I4891 Unspecified atrial fibrillation: Secondary | ICD-10-CM

## 2011-05-10 DIAGNOSIS — Z950 Presence of cardiac pacemaker: Secondary | ICD-10-CM

## 2011-05-10 DIAGNOSIS — I495 Sick sinus syndrome: Secondary | ICD-10-CM

## 2011-05-10 DIAGNOSIS — I10 Essential (primary) hypertension: Secondary | ICD-10-CM

## 2011-05-10 LAB — PACEMAKER DEVICE OBSERVATION
BATTERY VOLTAGE: 2.62 V
RV LEAD THRESHOLD: 2.5 V

## 2011-05-10 NOTE — Patient Instructions (Signed)
Your physician has recommended that you have a pacemaker generator change with possible lead revision

## 2011-05-10 NOTE — Progress Notes (Addendum)
HPI [name] returns today for followup. He is a very pleasant 77 year old man with a history of symptomatic bradycardia, status post pacemaker insertion. He has chronic atrial fibrillation. He denies chest pain, shortness of breath, or peripheral edema. He has reached elective replacement indication on his now 75 year old pacemaker. He denies syncope. Allergies  Allergen Reactions  . Ace Inhibitors     REACTION: cough  . Codeine Phosphate     REACTION: nausea  . Hydrochlorothiazide W/Triamterene     REACTION: cramps     Current Outpatient Prescriptions  Medication Sig Dispense Refill  . ALPRAZolam (XANAX) 0.25 MG tablet TAKE ONE-HALF TO ONE TABLET 1 HOUR BEFORE FLYING  20 tablet  0  . Aspirin (BAYER LOW STRENGTH PO) Take by mouth daily.        [name] atenolol (TENORMIN) 50 MG tablet TAKE ONE TABLET BY MOUTH TWICE DAILY  180 tablet  1  . Calcium Carbonate (CALCIUM 600) 1500 MG TABS Take by mouth as needed.       . fenofibrate micronized (LOFIBRA) 134 MG capsule Take 134 mg by mouth daily.        [name] levothyroxine (SYNTHROID, LEVOTHROID) 88 MCG tablet TAKE ONE TABLET BY MOUTH EVERY DAY  30 tablet  2  . Levothyroxine Sodium 88 MCG CAPS Take by mouth daily.        . metFORMIN (GLUCOPHAGE) 850 MG tablet Take 850 mg by mouth 2 (two) times daily with a meal.        . nitroGLYCERIN (NITROSTAT) 0.4 MG SL tablet DISSOLVE ONE TABLET UNDER THE TONGUE EVERY 5 MINUTES AS NEEDED FOR CHEST PAIN.  DO NOT EXCEED A TOTAL OF 3 DOSES IN 15 MINUTES WITHOUT  SEEK  25 tablet  2  . pravastatin (PRAVACHOL) 40 MG tablet TAKE TWO TABLETS BY MOUTH EVERY DAY  60 tablet  6  . warfarin (COUMADIN) 3 MG tablet TAKE AS DIRECTED. 1 tablet every day, except Wednesday & Friday 1.5 tablet.       [name] amLODipine (NORVASC) 5 MG tablet TAKE ONE-HALF TABLET BY MOUTH EVERY DAY  45 tablet  1  . omeprazole (PRILOSEC) 40 MG capsule Take 40 mg by mouth daily.           Past Medical History  Diagnosis Date  . Diabetes mellitus, type 2   .  CAD (coronary artery disease)   . Hypertension   . Hyperlipidemia   . Atrial fibrillation   . AAA (abdominal aortic aneurysm)     next u/s due 03-2012  . Hypothyroidism   . BPH (benign prostatic hyperplasia)     reports aprocedure (TURP) remotely in [place].Marland KitchenNo futher f/u w/ urology  . GERD (gastroesophageal reflux disease)   . Insomnia     transient  . Vitamin B12 deficiency   . Osteopenia     dexa 2-11    ROS:   All systems reviewed and negative except as noted in the HPI.   Past Surgical History  Procedure Date  . Pacemaker 1980, 1995    medtronic minix [id number]  . Cholecystectomy, laparoscopic   . Prostatectomy     transurethral  . Inguinal herniorrhaphies     bilateral  . Right hip replacement   . Coronary artery bypass graft     1999 stents in 2000  . Penile prosthesis implant 1992  . Transthoracic echocardiogram 12/2006     Family History  Problem Relation Age of Onset  . Alzheimer's disease Mother   . Mental illness  Mother     alzheimers  . Heart failure Father   . Stroke Father     cva  . Heart disease Father     chf  . Heart disease Brother     maybe  . Diabetes Other     several siblings  . Coronary artery disease Brother     cabg  . Alzheimer's disease Sister   . Mental illness Sister     alzheimers  . Cancer Neg Hx      History   Social History  . Marital Status: Legally Separated    Spouse Name: N/A    Number of Children: N/A  . Years of Education: N/A   Occupational History  . retired    Social History Main Topics  . Smoking status: Never Smoker   . Smokeless tobacco: Not on file  . Alcohol Use: Yes     rare  . Drug Use: No  . Sexually Active: Not on file   Other Topics Concern  . Not on file   Social History Narrative   Lives by self, independent on ADL, retired. Separated from wife. Daughter, [name], lives in same neighborhood. Does not regularly exercise. Daily Caffeine Use-2 cups daily. Diet-improved lately.       BP  138/78  Ht 5\' 7"  (1.702 m)  Wt 83.915 kg (185 lb)  BMI 28.97 kg/m2  Physical Exam:  Well appearing NAD HEENT: Unremarkable Neck:  No JVD, no thyromegally Lymphatics:  No adenopathy Back:  No CVA tenderness Lungs:  Clear with no wheezes, rales, or rhonchi. Well-healed pacemaker incision. HEART:  Regular rate rhythm, no murmurs, no rubs, no clicks Abd:  soft, positive bowel sounds, no organomegally, no rebound, no guarding Ext:  2 plus pulses, no edema, no cyanosis, no clubbing Skin:  No rashes no nodules Neuro:  CN II through XII intact, motor grossly intact  DEVICE  Normal device function.  See PaceArt for details.   Assess/Plan:

## 2011-05-10 NOTE — Progress Notes (Signed)
Addended by: Dennis Bast F on: 05/10/2011 05:36 PM   Modules accepted: Orders

## 2011-05-10 NOTE — Progress Notes (Signed)
Addended by: Marinus Maw on: 05/10/2011 05:43 PM   Modules accepted: Orders, Level of Service

## 2011-05-10 NOTE — Assessment & Plan Note (Signed)
His device has reached elective replacement. We'll schedule pacemaker generator change with possible pacemaker lead revision in the next several weeks.

## 2011-05-10 NOTE — Assessment & Plan Note (Signed)
His blood pressure appears to be fairly well controlled. We will continue his current medical therapy and maintain a low-sodium diet

## 2011-05-13 ENCOUNTER — Other Ambulatory Visit: Payer: Self-pay | Admitting: *Deleted

## 2011-05-13 DIAGNOSIS — I4891 Unspecified atrial fibrillation: Secondary | ICD-10-CM

## 2011-05-22 ENCOUNTER — Ambulatory Visit: Payer: Medicare Other | Admitting: Internal Medicine

## 2011-05-22 DIAGNOSIS — Z0289 Encounter for other administrative examinations: Secondary | ICD-10-CM

## 2011-05-23 ENCOUNTER — Other Ambulatory Visit: Payer: Self-pay | Admitting: Internal Medicine

## 2011-05-23 ENCOUNTER — Encounter (HOSPITAL_COMMUNITY): Payer: Self-pay | Admitting: Respiratory Therapy

## 2011-05-24 ENCOUNTER — Ambulatory Visit (INDEPENDENT_AMBULATORY_CARE_PROVIDER_SITE_OTHER): Payer: Medicare Other | Admitting: *Deleted

## 2011-05-24 DIAGNOSIS — Z7901 Long term (current) use of anticoagulants: Secondary | ICD-10-CM | POA: Diagnosis not present

## 2011-05-24 DIAGNOSIS — I4891 Unspecified atrial fibrillation: Secondary | ICD-10-CM | POA: Diagnosis not present

## 2011-05-24 NOTE — Patient Instructions (Addendum)
3 mg daily except 4.5 mg on Wednesday and Friday. Recheck in 4 weeks.

## 2011-05-28 ENCOUNTER — Encounter: Payer: Medicare Other | Admitting: Internal Medicine

## 2011-05-29 ENCOUNTER — Other Ambulatory Visit (INDEPENDENT_AMBULATORY_CARE_PROVIDER_SITE_OTHER): Payer: Medicare Other | Admitting: *Deleted

## 2011-05-29 DIAGNOSIS — I495 Sick sinus syndrome: Secondary | ICD-10-CM | POA: Diagnosis not present

## 2011-05-29 DIAGNOSIS — I4891 Unspecified atrial fibrillation: Secondary | ICD-10-CM

## 2011-05-29 LAB — CBC WITH DIFFERENTIAL/PLATELET
Basophils Absolute: 0 10*3/uL (ref 0.0–0.1)
Eosinophils Absolute: 0.1 10*3/uL (ref 0.0–0.7)
Hemoglobin: 11.7 g/dL — ABNORMAL LOW (ref 13.0–17.0)
Lymphocytes Relative: 17.5 % (ref 12.0–46.0)
Monocytes Relative: 11.5 % (ref 3.0–12.0)
Neutrophils Relative %: 67.6 % (ref 43.0–77.0)
Platelets: 193 10*3/uL (ref 150.0–400.0)
RDW: 13.8 % (ref 11.5–14.6)

## 2011-05-29 LAB — BASIC METABOLIC PANEL
BUN: 15 mg/dL (ref 6–23)
Calcium: 8.4 mg/dL (ref 8.4–10.5)
Creatinine, Ser: 1.3 mg/dL (ref 0.4–1.5)
GFR: 55.51 mL/min — ABNORMAL LOW (ref >60.00)
Glucose, Bld: 174 mg/dL — ABNORMAL HIGH (ref 70–99)
Sodium: 139 mEq/L (ref 135–145)

## 2011-05-29 LAB — PROTIME-INR
INR: 1.8 ratio — ABNORMAL HIGH (ref 0.8–1.0)
Prothrombin Time: 19.9 s — ABNORMAL HIGH (ref 10.2–12.4)

## 2011-06-04 MED ORDER — CEFAZOLIN SODIUM-DEXTROSE 2-3 GM-% IV SOLR
2.0000 g | INTRAVENOUS | Status: DC
  Filled 2011-06-04: qty 50

## 2011-06-04 MED ORDER — SODIUM CHLORIDE 0.9 % IR SOLN
80.0000 mg | Status: DC
  Filled 2011-06-04: qty 2

## 2011-06-05 ENCOUNTER — Ambulatory Visit (HOSPITAL_COMMUNITY)
Admission: RE | Admit: 2011-06-05 | Discharge: 2011-06-05 | Disposition: A | Discharge status: Home / Self Care | Payer: Medicare Other | Source: Ambulatory Visit | Attending: Internal Medicine | Admitting: Internal Medicine

## 2011-06-05 ENCOUNTER — Encounter (HOSPITAL_COMMUNITY)
Admission: RE | Disposition: A | Discharge status: Home / Self Care | Payer: Self-pay | Source: Ambulatory Visit | Attending: Internal Medicine

## 2011-06-05 DIAGNOSIS — N400 Enlarged prostate without lower urinary tract symptoms: Secondary | ICD-10-CM | POA: Insufficient documentation

## 2011-06-05 DIAGNOSIS — E785 Hyperlipidemia, unspecified: Secondary | ICD-10-CM | POA: Insufficient documentation

## 2011-06-05 DIAGNOSIS — Z45018 Encounter for adjustment and management of other part of cardiac pacemaker: Secondary | ICD-10-CM | POA: Insufficient documentation

## 2011-06-05 DIAGNOSIS — E039 Hypothyroidism, unspecified: Secondary | ICD-10-CM | POA: Insufficient documentation

## 2011-06-05 DIAGNOSIS — M899 Disorder of bone, unspecified: Secondary | ICD-10-CM | POA: Insufficient documentation

## 2011-06-05 DIAGNOSIS — I714 Abdominal aortic aneurysm, without rupture, unspecified: Secondary | ICD-10-CM | POA: Insufficient documentation

## 2011-06-05 DIAGNOSIS — G4700 Insomnia, unspecified: Secondary | ICD-10-CM | POA: Diagnosis not present

## 2011-06-05 DIAGNOSIS — I4891 Unspecified atrial fibrillation: Secondary | ICD-10-CM | POA: Diagnosis not present

## 2011-06-05 DIAGNOSIS — I10 Essential (primary) hypertension: Secondary | ICD-10-CM | POA: Diagnosis not present

## 2011-06-05 DIAGNOSIS — I495 Sick sinus syndrome: Secondary | ICD-10-CM | POA: Diagnosis not present

## 2011-06-05 DIAGNOSIS — I2510 Atherosclerotic heart disease of native coronary artery without angina pectoris: Secondary | ICD-10-CM | POA: Insufficient documentation

## 2011-06-05 DIAGNOSIS — E119 Type 2 diabetes mellitus without complications: Secondary | ICD-10-CM | POA: Diagnosis not present

## 2011-06-05 DIAGNOSIS — M949 Disorder of cartilage, unspecified: Secondary | ICD-10-CM | POA: Insufficient documentation

## 2011-06-05 DIAGNOSIS — E538 Deficiency of other specified B group vitamins: Secondary | ICD-10-CM | POA: Insufficient documentation

## 2011-06-05 HISTORY — PX: PERMANENT PACEMAKER GENERATOR CHANGE: SHX6022

## 2011-06-05 LAB — GLUCOSE, CAPILLARY: Glucose-Capillary: 152 mg/dL — ABNORMAL HIGH (ref 70–99)

## 2011-06-05 LAB — PROTIME-INR
INR: 1.53 — ABNORMAL HIGH (ref 0.00–1.49)
Prothrombin Time: 18.7 seconds — ABNORMAL HIGH (ref 11.6–15.2)

## 2011-06-05 SURGERY — PERMANENT PACEMAKER GENERATOR CHANGE
Anesthesia: LOCAL

## 2011-06-05 MED ORDER — FENTANYL CITRATE 0.05 MG/ML IJ SOLN
INTRAMUSCULAR | Status: AC
  Filled 2011-06-05: qty 2

## 2011-06-05 MED ORDER — CEFAZOLIN SODIUM-DEXTROSE 2-3 GM-% IV SOLR
INTRAVENOUS | Status: AC
  Filled 2011-06-05: qty 50

## 2011-06-05 MED ORDER — ONDANSETRON HCL 4 MG/2ML IJ SOLN: 4.0000 mg | Freq: Four times a day (QID) | INTRAMUSCULAR | Status: DC | PRN

## 2011-06-05 MED ORDER — SODIUM CHLORIDE 0.9 % IV SOLN
INTRAVENOUS | Status: DC
  Administered 2011-06-05: 07:00:00 via INTRAVENOUS

## 2011-06-05 MED ORDER — MUPIROCIN 2 % EX OINT
TOPICAL_OINTMENT | CUTANEOUS | Status: AC
  Administered 2011-06-05: 1 via NASAL
  Filled 2011-06-05: qty 22

## 2011-06-05 MED ORDER — MIDAZOLAM HCL 5 MG/5ML IJ SOLN
INTRAMUSCULAR | Status: AC
  Filled 2011-06-05: qty 5

## 2011-06-05 MED ORDER — SODIUM CHLORIDE 0.45 % IV SOLN: INTRAVENOUS | Status: DC

## 2011-06-05 MED ORDER — CEFAZOLIN SODIUM 1-5 GM-% IV SOLN: 1.0000 g | Freq: Four times a day (QID) | INTRAVENOUS | Status: DC

## 2011-06-05 MED ORDER — CEFAZOLIN SODIUM 1-5 GM-% IV SOLN
INTRAVENOUS | Status: AC
  Filled 2011-06-05: qty 50

## 2011-06-05 MED ORDER — HEPARIN (PORCINE) IN NACL 2-0.9 UNIT/ML-% IJ SOLN
INTRAMUSCULAR | Status: AC
  Filled 2011-06-05: qty 1000

## 2011-06-05 MED ORDER — LIDOCAINE HCL (PF) 1 % IJ SOLN
INTRAMUSCULAR | Status: AC
  Filled 2011-06-05: qty 60

## 2011-06-05 NOTE — Op Note (Signed)
NAMEIVON, OELKERS                   ACCOUNT NO.:  [id number]  MEDICAL RECORD NO.:  [id number]  LOCATION:  [facility]                         FACILITY:  [facility]  PHYSICIAN:  Doylene Canning. Ladona Ridgel, MD    DATE OF BIRTH:  July 12, 1925  DATE OF PROCEDURE:  06/05/2011 DATE OF DISCHARGE:  06/05/2011                              OPERATIVE REPORT   PROCEDURE PERFORMED:  Removal of previously implanted single-chamber pacemaker, pacemaker pocket revision, and insertion of a new single- chamber pacemaker.  INTRODUCTION:  The patient is an 78 year old male with chronic atrial fibrillation and symptomatic tachy-brady syndrome.  He has a history of pacemaker insertion almost 18 years ago.  He is now referred for pacemaker generator change.  PROCEDURE:  After informed consent was obtained, the patient was taken to the diagnostic EP lab in a fasting state.  After usual preparation and draping, intravenous fentanyl and midazolam were given for sedation. Lidocaine 30 mL was infiltrated into the right infraclavicular region. A 5-cm incision was carried out over this region and electrocautery was utilized to dissect down to the pacemaker pocket.  The pocket was entered without difficulty.  The old Medtronic generator was removed. The pacemaker pocket was revised to accommodate the new device, which was of a different shape.  The new Medtronic Adapta single-chamber pacemaker, serial number [id number] and was placed back in the subcutaneous pocket.  The pocket was irrigated with antibiotic irrigation, and the incision was closed with 2-0 and 3-0 Vicryl. Benzoin and Steri-Strips painted on the skin, pressure dressing was applied, and the patient was returned to his room in satisfactory condition.  COMPLICATIONS:  There were no immediate procedure complications.  RESULTS:  This demonstrates successful implantation of a Medtronic single-chamber pacemaker in a patient with symptomatic bradycardia.     Doylene Canning.  Ladona Ridgel, MD     GWT/MEDQ  D:  06/05/2011  T:  06/05/2011  Job:  [id number]

## 2011-06-05 NOTE — Interval H&P Note (Signed)
History and Physical Interval Note:  06/05/2011 6:54 AM  [name]  has presented today for surgery, with the diagnosis of End of life  The various methods of treatment have been discussed with the patient and family. After consideration of risks, benefits and other options for treatment, the patient has consented to  Procedure(s): PERMANENT PACEMAKER GENERATOR CHANGE as a surgical intervention .  The patients' history has been reviewed, patient examined, no change in status, stable for surgery.  I have reviewed the patients' chart and labs.  Questions were answered to the patient's satisfaction.     Lewayne Bunting

## 2011-06-05 NOTE — H&P (View-Only) (Signed)
HPI Mr. Wire returns today for followup. He is a very pleasant 78-year-old man with a history of symptomatic bradycardia, status post pacemaker insertion. He has chronic atrial fibrillation. He denies chest pain, shortness of breath, or peripheral edema. He has reached elective replacement indication on his now 76 year old pacemaker. He denies syncope. Allergies  Allergen Reactions  . Ace Inhibitors     REACTION: cough  . Codeine Phosphate     REACTION: nausea  . Hydrochlorothiazide W/Triamterene     REACTION: cramps     Current Outpatient Prescriptions  Medication Sig Dispense Refill  . ALPRAZolam (XANAX) 0.25 MG tablet TAKE ONE-HALF TO ONE TABLET 1 HOUR BEFORE FLYING  20 tablet  0  . Aspirin (BAYER LOW STRENGTH PO) Take by mouth daily.        . atenolol (TENORMIN) 50 MG tablet TAKE ONE TABLET BY MOUTH TWICE DAILY  180 tablet  1  . Calcium Carbonate (CALCIUM 600) 1500 MG TABS Take by mouth as needed.       . fenofibrate micronized (LOFIBRA) 134 MG capsule Take 134 mg by mouth daily.        . levothyroxine (SYNTHROID, LEVOTHROID) 88 MCG tablet TAKE ONE TABLET BY MOUTH EVERY DAY  30 tablet  2  . Levothyroxine Sodium 88 MCG CAPS Take by mouth daily.        . metFORMIN (GLUCOPHAGE) 850 MG tablet Take 850 mg by mouth 2 (two) times daily with a meal.        . nitroGLYCERIN (NITROSTAT) 0.4 MG SL tablet DISSOLVE ONE TABLET UNDER THE TONGUE EVERY 5 MINUTES AS NEEDED FOR CHEST PAIN.  DO NOT EXCEED A TOTAL OF 3 DOSES IN 15 MINUTES WITHOUT  SEEK  25 tablet  2  . pravastatin (PRAVACHOL) 40 MG tablet TAKE TWO TABLETS BY MOUTH EVERY DAY  60 tablet  6  . warfarin (COUMADIN) 3 MG tablet TAKE AS DIRECTED. 1 tablet every day, except Wednesday & Friday 1.5 tablet.       . amLODipine (NORVASC) 5 MG tablet TAKE ONE-HALF TABLET BY MOUTH EVERY DAY  45 tablet  1  . omeprazole (PRILOSEC) 40 MG capsule Take 40 mg by mouth daily.           Past Medical History  Diagnosis Date  . Diabetes mellitus, type 2   .  CAD (coronary artery disease)   . Hypertension   . Hyperlipidemia   . Atrial fibrillation   . AAA (abdominal aortic aneurysm)     next u/s due 03-2012  . Hypothyroidism   . BPH (benign prostatic hyperplasia)     reports aprocedure (TURP) remotely in [place]..No futher f/u w/ urology  . GERD (gastroesophageal reflux disease)   . Insomnia     transient  . Vitamin B12 deficiency   . Osteopenia     dexa 2-11    ROS:   All systems reviewed and negative except as noted in the HPI.   Past Surgical History  Procedure Date  . Pacemaker 1980, 1995    medtronic minix [id number]  . Cholecystectomy, laparoscopic   . Prostatectomy     transurethral  . Inguinal herniorrhaphies     bilateral  . Right hip replacement   . Coronary artery bypass graft     1999 stents in 2000  . Penile prosthesis implant 1992  . Transthoracic echocardiogram 12/2006     Family History  Problem Relation Age of Onset  . Alzheimer's disease Mother   . Mental illness   Mother     alzheimers  . Heart failure Father   . Stroke Father     cva  . Heart disease Father     chf  . Heart disease Brother     maybe  . Diabetes Other     several siblings  . Coronary artery disease Brother     cabg  . Alzheimer's disease Sister   . Mental illness Sister     alzheimers  . Cancer Neg Hx      History   Social History  . Marital Status: Legally Separated    Spouse Name: N/A    Number of Children: N/A  . Years of Education: N/A   Occupational History  . retired    Social History Main Topics  . Smoking status: Never Smoker   . Smokeless tobacco: Not on file  . Alcohol Use: Yes     rare  . Drug Use: No  . Sexually Active: Not on file   Other Topics Concern  . Not on file   Social History Narrative   Lives by self, independent on ADL, retired. Separated from wife. Daughter, Leigh, lives in same neighborhood. Does not regularly exercise. Daily Caffeine Use-2 cups daily. Diet-improved lately.       BP  138/78  Ht 5' 7" (1.702 m)  Wt 83.915 kg (185 lb)  BMI 28.97 kg/m2  Physical Exam:  Well appearing NAD HEENT: Unremarkable Neck:  No JVD, no thyromegally Lymphatics:  No adenopathy Back:  No CVA tenderness Lungs:  Clear with no wheezes, rales, or rhonchi. Well-healed pacemaker incision. HEART:  Regular rate rhythm, no murmurs, no rubs, no clicks Abd:  soft, positive bowel sounds, no organomegally, no rebound, no guarding Ext:  2 plus pulses, no edema, no cyanosis, no clubbing Skin:  No rashes no nodules Neuro:  CN II through XII intact, motor grossly intact  DEVICE  Normal device function.  See PaceArt for details.   Assess/Plan:   

## 2011-06-05 NOTE — Op Note (Signed)
PPM removal/pocket revision/insertion of a new device placed without immediate complication. U#[id number].

## 2011-06-17 ENCOUNTER — Ambulatory Visit (INDEPENDENT_AMBULATORY_CARE_PROVIDER_SITE_OTHER): Payer: Medicare Other | Admitting: *Deleted

## 2011-06-17 ENCOUNTER — Encounter: Payer: Self-pay | Admitting: Internal Medicine

## 2011-06-17 ENCOUNTER — Other Ambulatory Visit: Payer: Self-pay | Admitting: *Deleted

## 2011-06-17 DIAGNOSIS — I495 Sick sinus syndrome: Secondary | ICD-10-CM

## 2011-06-17 LAB — PACEMAKER DEVICE OBSERVATION: RV LEAD THRESHOLD: 0.75 V

## 2011-06-17 MED ORDER — DIGOXIN 125 MCG PO TABS: 125.0000 ug | ORAL_TABLET | Freq: Every day | ORAL | Status: DC

## 2011-06-17 NOTE — Progress Notes (Signed)
Wound check-PPM 

## 2011-06-19 ENCOUNTER — Other Ambulatory Visit: Payer: Self-pay | Admitting: Internal Medicine

## 2011-06-19 NOTE — Telephone Encounter (Signed)
Refill done.  

## 2011-07-15 ENCOUNTER — Other Ambulatory Visit: Payer: Self-pay | Admitting: Internal Medicine

## 2011-07-16 NOTE — Telephone Encounter (Signed)
Prescription sent to pharmacy.

## 2011-08-14 ENCOUNTER — Encounter: Payer: Self-pay | Admitting: Internal Medicine

## 2011-08-19 ENCOUNTER — Other Ambulatory Visit: Payer: Self-pay | Admitting: Cardiology

## 2011-09-19 ENCOUNTER — Encounter: Payer: Self-pay | Admitting: Internal Medicine

## 2011-09-19 ENCOUNTER — Ambulatory Visit (INDEPENDENT_AMBULATORY_CARE_PROVIDER_SITE_OTHER): Payer: Medicare Other | Admitting: Internal Medicine

## 2011-09-19 VITALS — BP 116/62 | HR 64 | Ht 67.0 in | Wt 174.8 lb

## 2011-09-19 DIAGNOSIS — I4891 Unspecified atrial fibrillation: Secondary | ICD-10-CM

## 2011-09-19 DIAGNOSIS — I10 Essential (primary) hypertension: Secondary | ICD-10-CM

## 2011-09-19 DIAGNOSIS — Z950 Presence of cardiac pacemaker: Secondary | ICD-10-CM | POA: Diagnosis not present

## 2011-09-19 LAB — PACEMAKER DEVICE OBSERVATION
BMOD-0005RV: 95 {beats}/min
RV LEAD AMPLITUDE: 11.2 mv
RV LEAD THRESHOLD: 0.75 V
VENTRICULAR PACING PM: 52

## 2011-09-19 NOTE — Assessment & Plan Note (Signed)
His device is working normally. We'll plan to recheck in several months. 

## 2011-09-19 NOTE — Progress Notes (Signed)
HPI [name] returns today for followup. He is a very pleasant 78 year old man with a history of atrial fibrillation and a rapid ventricular response, symptomatic bradycardia status post pacemaker insertion, and diastolic heart failure. The patient was seen by me several months ago and his ventricular rates were elevated. At that time, I recommended digoxin. Since then he has improved. He has fewer palpitations, and less dyspnea. He denies syncope. Minimal peripheral edema is present. Allergies  Allergen Reactions  . Ace Inhibitors     REACTION: cough  . Codeine Phosphate     REACTION: nausea  . Hydrochlorothiazide W-Triamterene     REACTION: cramps     Current Outpatient Prescriptions  Medication Sig Dispense Refill  . ALPRAZolam (XANAX) 0.25 MG tablet Take 0.25 mg by mouth as needed. Take 1 hour before flying      . amLODipine (NORVASC) 5 MG tablet TAKE ONE-HALF TABLET BY MOUTH EVERY DAY  45 tablet  6  . aspirin 81 MG tablet Take 81 mg by mouth daily.      [name] atenolol (TENORMIN) 50 MG tablet Take 50 mg by mouth 2 (two) times daily.      . digoxin (LANOXIN) 0.125 MG tablet Take 1 tablet (125 mcg total) by mouth daily.  30 tablet  11  . fenofibrate micronized (LOFIBRA) 134 MG capsule TAKE ONE CAPSULE BY MOUTH EVERY DAY  90 capsule  1  . levothyroxine (SYNTHROID, LEVOTHROID) 88 MCG tablet TAKE ONE TABLET BY MOUTH EVERY DAY  30 tablet  6  . metFORMIN (GLUCOPHAGE) 850 MG tablet Take 850 mg by mouth 2 (two) times daily with a meal.        . nitroGLYCERIN (NITROSTAT) 0.4 MG SL tablet Place 0.4 mg under the tongue every 5 (five) minutes as needed. For chest pain      . omeprazole (PRILOSEC) 40 MG capsule Take 40 mg by mouth daily.       . pravastatin (PRAVACHOL) 40 MG tablet Take 40 mg by mouth 2 (two) times daily.       [name] warfarin (COUMADIN) 3 MG tablet TAKE AS DIRECTED  60 tablet  3  . DISCONTD: amLODipine (NORVASC) 5 MG tablet Take 2.5 mg by mouth daily.      [name] DISCONTD: Levothyroxine Sodium  88 MCG CAPS Take 88 mcg by mouth daily.       [name] DISCONTD: warfarin (COUMADIN) 3 MG tablet Take 3-4.5 mg by mouth daily. Take 1 tablet every day, except Wednesday & Friday take 1.5 tablets.         Past Medical History  Diagnosis Date  . Diabetes mellitus, type 2   . CAD (coronary artery disease)   . Hypertension   . Hyperlipidemia   . Atrial fibrillation   . AAA (abdominal aortic aneurysm)     next u/s due 03-2012  . Hypothyroidism   . BPH (benign prostatic hyperplasia)     reports aprocedure (TURP) remotely in [place].Marland KitchenNo futher f/u w/ urology  . GERD (gastroesophageal reflux disease)   . Insomnia     transient  . Vitamin B12 deficiency   . Osteopenia     dexa 2-11    ROS:   All systems reviewed and negative except as noted in the HPI.   Past Surgical History  Procedure Date  . Pacemaker 1980, 1995    medtronic minix [id number]  . Cholecystectomy, laparoscopic   . Prostatectomy     transurethral  . Inguinal herniorrhaphies     bilateral  .  Right hip replacement   . Coronary artery bypass graft     1999 stents in 2000  . Penile prosthesis implant 1992  . Transthoracic echocardiogram 12/2006  . Cardiac catheterization 01/31/06, 09/25/10     Family History  Problem Relation Age of Onset  . Alzheimer's disease Mother   . Mental illness Mother     alzheimers  . Heart failure Father   . Stroke Father     cva  . Heart disease Father     chf  . Heart disease Brother     maybe  . Diabetes Other     several siblings  . Coronary artery disease Brother     cabg  . Alzheimer's disease Sister   . Mental illness Sister     alzheimers  . Cancer Neg Hx      History   Social History  . Marital Status: Legally Separated    Spouse Name: N/A    Number of Children: N/A  . Years of Education: N/A   Occupational History  . retired    Social History Main Topics  . Smoking status: Never Smoker   . Smokeless tobacco: Not on file  . Alcohol Use: Yes     rare  . Drug Use:  No  . Sexually Active: Not on file   Other Topics Concern  . Not on file   Social History Narrative   Lives by self, independent on ADL, retired. Separated from wife. Daughter, [name], lives in same neighborhood. Does not regularly exercise. Daily Caffeine Use-2 cups daily. Diet-improved lately.       BP 116/62  Pulse 64  Ht 5\' 7"  (1.702 m)  Wt 174 lb 12.8 oz (79.289 kg)  BMI 27.38 kg/m2  Physical Exam:  Well appearing elderly man, NAD HEENT: Unremarkable Neck:  No JVD, no thyromegally Lungs:  Clear with no wheezes, rales, or rhonchi. Well-healed pacemaker incision. HEART:  Regular rate rhythm, no murmurs, no rubs, no clicks Abd:  soft, positive bowel sounds, no organomegally, no rebound, no guarding Ext:  2 plus pulses, no edema, no cyanosis, no clubbing Skin:  No rashes no nodules Neuro:  CN II through XII intact, motor grossly intact   DEVICE  Normal device function.  See PaceArt for details. Ventricular rates are improved. 52% ventricular pacing.  Assess/Plan:

## 2011-09-19 NOTE — Assessment & Plan Note (Signed)
His ventricular rates are much improved. He will continue his current medical therapy including atenolol and digoxin.

## 2011-09-19 NOTE — Patient Instructions (Signed)
Your physician wants you to follow-up in: Jan 2013 You will receive a reminder letter in the mail two months in advance. If you don't receive a letter, please call our office to schedule the follow-up appointment.  

## 2011-09-19 NOTE — Assessment & Plan Note (Signed)
His blood pressure is well controlled. He will continue his current medical therapy and maintain a low-sodium diet. 

## 2011-10-03 ENCOUNTER — Observation Stay (HOSPITAL_COMMUNITY)
Admission: EM | Admit: 2011-10-03 | Discharge: 2011-10-05 | Disposition: A | Discharge status: Home / Self Care | Payer: Medicare Other | Attending: Cardiology | Admitting: Cardiology

## 2011-10-03 ENCOUNTER — Encounter (HOSPITAL_COMMUNITY): Payer: Self-pay | Admitting: Emergency Medicine

## 2011-10-03 DIAGNOSIS — K219 Gastro-esophageal reflux disease without esophagitis: Secondary | ICD-10-CM | POA: Diagnosis not present

## 2011-10-03 DIAGNOSIS — R079 Chest pain, unspecified: Secondary | ICD-10-CM | POA: Diagnosis not present

## 2011-10-03 DIAGNOSIS — Z96649 Presence of unspecified artificial hip joint: Secondary | ICD-10-CM | POA: Insufficient documentation

## 2011-10-03 DIAGNOSIS — I25810 Atherosclerosis of coronary artery bypass graft(s) without angina pectoris: Secondary | ICD-10-CM | POA: Diagnosis not present

## 2011-10-03 DIAGNOSIS — I10 Essential (primary) hypertension: Secondary | ICD-10-CM | POA: Diagnosis not present

## 2011-10-03 DIAGNOSIS — E119 Type 2 diabetes mellitus without complications: Secondary | ICD-10-CM | POA: Diagnosis not present

## 2011-10-03 DIAGNOSIS — I4891 Unspecified atrial fibrillation: Secondary | ICD-10-CM | POA: Diagnosis not present

## 2011-10-03 DIAGNOSIS — E785 Hyperlipidemia, unspecified: Secondary | ICD-10-CM | POA: Diagnosis not present

## 2011-10-03 DIAGNOSIS — Z950 Presence of cardiac pacemaker: Secondary | ICD-10-CM | POA: Diagnosis not present

## 2011-10-03 DIAGNOSIS — I209 Angina pectoris, unspecified: Secondary | ICD-10-CM | POA: Diagnosis not present

## 2011-10-03 DIAGNOSIS — I2510 Atherosclerotic heart disease of native coronary artery without angina pectoris: Secondary | ICD-10-CM | POA: Diagnosis not present

## 2011-10-03 DIAGNOSIS — E039 Hypothyroidism, unspecified: Secondary | ICD-10-CM | POA: Insufficient documentation

## 2011-10-03 DIAGNOSIS — R609 Edema, unspecified: Secondary | ICD-10-CM | POA: Insufficient documentation

## 2011-10-03 DIAGNOSIS — Z951 Presence of aortocoronary bypass graft: Secondary | ICD-10-CM | POA: Diagnosis not present

## 2011-10-03 DIAGNOSIS — I714 Abdominal aortic aneurysm, without rupture, unspecified: Secondary | ICD-10-CM | POA: Insufficient documentation

## 2011-10-03 DIAGNOSIS — R071 Chest pain on breathing: Secondary | ICD-10-CM | POA: Diagnosis not present

## 2011-10-03 LAB — CBC
Hemoglobin: 10.7 g/dL — ABNORMAL LOW (ref 13.0–17.0)
RBC: 3.72 MIL/uL — ABNORMAL LOW (ref 4.22–5.81)

## 2011-10-03 NOTE — ED Notes (Signed)
Patient with chest pain that started at 2130 this evening.  Patient took 2 sl nitro at home, patient had relief of pain after 2nd nitro.  Denies any shortness of breath, no nausea.

## 2011-10-03 NOTE — ED Notes (Signed)
Pt. Reports onset of chest pain around 2130. States he took 3 baby aspirin, atenolol, and 2 nitro. Reports pain ceased after second nitro. Hx of bypass, stents, and has a pacemaker. Denies sweating, dizziness, radiation.  Pt. denies chest pain currently.

## 2011-10-04 ENCOUNTER — Encounter (HOSPITAL_COMMUNITY)
Admission: EM | Disposition: A | Discharge status: Home / Self Care | Payer: Self-pay | Source: Home / Self Care | Attending: Emergency Medicine

## 2011-10-04 ENCOUNTER — Emergency Department (HOSPITAL_COMMUNITY): Payer: Medicare Other

## 2011-10-04 ENCOUNTER — Encounter (HOSPITAL_COMMUNITY): Payer: Self-pay | Admitting: Internal Medicine

## 2011-10-04 DIAGNOSIS — R079 Chest pain, unspecified: Secondary | ICD-10-CM

## 2011-10-04 DIAGNOSIS — I2510 Atherosclerotic heart disease of native coronary artery without angina pectoris: Secondary | ICD-10-CM

## 2011-10-04 DIAGNOSIS — I200 Unstable angina: Secondary | ICD-10-CM

## 2011-10-04 HISTORY — PX: LEFT HEART CATHETERIZATION WITH CORONARY ANGIOGRAM: SHX5451

## 2011-10-04 LAB — BASIC METABOLIC PANEL
BUN: 19 mg/dL (ref 6–23)
CO2: 24 mEq/L (ref 19–32)
Calcium: 8.8 mg/dL (ref 8.4–10.5)
GFR calc non Af Amer: 42 mL/min — ABNORMAL LOW (ref >90)
GFR calc non Af Amer: 45 mL/min — ABNORMAL LOW (ref >90)
Glucose, Bld: 133 mg/dL — ABNORMAL HIGH (ref 70–99)
Glucose, Bld: 192 mg/dL — ABNORMAL HIGH (ref 70–99)
Potassium: 4.4 mEq/L (ref 3.5–5.1)
Sodium: 135 mEq/L (ref 135–145)
Sodium: 136 mEq/L (ref 135–145)

## 2011-10-04 LAB — HEMOGLOBIN A1C: Hgb A1c MFr Bld: 8 % — ABNORMAL HIGH (ref <5.7)

## 2011-10-04 LAB — PROTIME-INR
INR: 1.36 (ref 0.00–1.49)
Prothrombin Time: 17 s — ABNORMAL HIGH (ref 11.6–15.2)
Prothrombin Time: 18.5 seconds — ABNORMAL HIGH (ref 11.6–15.2)

## 2011-10-04 LAB — DIGOXIN LEVEL: Digoxin Level: 0.7 ng/mL — ABNORMAL LOW (ref 0.8–2.0)

## 2011-10-04 LAB — CARDIAC PANEL(CRET KIN+CKTOT+MB+TROPI)
CK, MB: 2.2 ng/mL (ref 0.3–4.0)
Relative Index: 1.4 (ref 0.0–2.5)
Total CK: 126 U/L (ref 7–232)
Total CK: 134 U/L (ref 7–232)
Troponin I: <0.3 ng/mL (ref <0.30)
Troponin I: <0.3 ng/mL (ref <0.30)

## 2011-10-04 LAB — LIPID PANEL
Cholesterol: 148 mg/dL (ref 0–200)
LDL Cholesterol: 79 mg/dL (ref 0–99)
Triglycerides: 205 mg/dL — ABNORMAL HIGH (ref <150)

## 2011-10-04 LAB — POCT I-STAT TROPONIN I: Troponin i, poc: 0 ng/mL (ref 0.00–0.08)

## 2011-10-04 LAB — PRO B NATRIURETIC PEPTIDE: Pro B Natriuretic peptide (BNP): 535.5 pg/mL — ABNORMAL HIGH (ref 0–450)

## 2011-10-04 LAB — GLUCOSE, CAPILLARY: Glucose-Capillary: 247 mg/dL — ABNORMAL HIGH (ref 70–99)

## 2011-10-04 SURGERY — LEFT HEART CATHETERIZATION WITH CORONARY ANGIOGRAM
Anesthesia: LOCAL

## 2011-10-04 MED ORDER — MORPHINE SULFATE 10 MG/ML IJ SOLN
2.0000 mg | INTRAMUSCULAR | Status: DC | PRN
  Administered 2011-10-04: 2 mg via INTRAVENOUS

## 2011-10-04 MED ORDER — ASPIRIN 81 MG PO CHEW
324.0000 mg | CHEWABLE_TABLET | ORAL | Status: AC
  Administered 2011-10-04: 324 mg via ORAL

## 2011-10-04 MED ORDER — SODIUM CHLORIDE 0.9 % IJ SOLN: 3.0000 mL | INTRAMUSCULAR | Status: DC | PRN

## 2011-10-04 MED ORDER — SODIUM CHLORIDE 0.9 % IV SOLN: INTRAVENOUS | Status: AC

## 2011-10-04 MED ORDER — ASPIRIN 300 MG RE SUPP: 300.0000 mg | RECTAL | Status: DC

## 2011-10-04 MED ORDER — LIDOCAINE HCL (PF) 1 % IJ SOLN
INTRAMUSCULAR | Status: AC
  Filled 2011-10-04: qty 30

## 2011-10-04 MED ORDER — ACETAMINOPHEN 325 MG PO TABS: 650.0000 mg | ORAL_TABLET | ORAL | Status: DC | PRN

## 2011-10-04 MED ORDER — HEPARIN (PORCINE) IN NACL 2-0.9 UNIT/ML-% IJ SOLN
INTRAMUSCULAR | Status: AC
  Filled 2011-10-04: qty 2000

## 2011-10-04 MED ORDER — AMLODIPINE BESYLATE 5 MG PO TABS
5.0000 mg | ORAL_TABLET | Freq: Every day | ORAL | Status: DC
  Administered 2011-10-04 – 2011-10-05 (×2): 5 mg via ORAL
  Filled 2011-10-04 (×2): qty 1

## 2011-10-04 MED ORDER — HEPARIN BOLUS VIA INFUSION
4000.0000 [IU] | Freq: Once | INTRAVENOUS | Status: AC
  Administered 2011-10-04: 4000 [IU] via INTRAVENOUS
  Filled 2011-10-04: qty 4000

## 2011-10-04 MED ORDER — ATORVASTATIN CALCIUM 20 MG PO TABS
20.0000 mg | ORAL_TABLET | Freq: Every day | ORAL | Status: DC
  Administered 2011-10-04: 20 mg via ORAL
  Filled 2011-10-04 (×2): qty 1

## 2011-10-04 MED ORDER — NITROGLYCERIN 0.4 MG SL SUBL: 0.4000 mg | SUBLINGUAL_TABLET | SUBLINGUAL | Status: DC | PRN

## 2011-10-04 MED ORDER — MORPHINE SULFATE 4 MG/ML IJ SOLN
INTRAMUSCULAR | Status: AC
  Filled 2011-10-04: qty 1

## 2011-10-04 MED ORDER — SODIUM CHLORIDE 0.9 % IV SOLN
INTRAVENOUS | Status: DC
  Administered 2011-10-04: 05:00:00 via INTRAVENOUS

## 2011-10-04 MED ORDER — INSULIN ASPART 100 UNIT/ML ~~LOC~~ SOLN
0.0000 [IU] | Freq: Three times a day (TID) | SUBCUTANEOUS | Status: DC
  Administered 2011-10-04: 2 [IU] via SUBCUTANEOUS
  Administered 2011-10-05: 3 [IU] via SUBCUTANEOUS

## 2011-10-04 MED ORDER — ASPIRIN 81 MG PO CHEW
324.0000 mg | CHEWABLE_TABLET | ORAL | Status: DC
  Filled 2011-10-04: qty 4

## 2011-10-04 MED ORDER — SIMVASTATIN 40 MG PO TABS
40.0000 mg | ORAL_TABLET | Freq: Every day | ORAL | Status: DC
  Filled 2011-10-04: qty 1

## 2011-10-04 MED ORDER — WARFARIN - PHARMACIST DOSING INPATIENT: Freq: Every day | Status: DC

## 2011-10-04 MED ORDER — MIDAZOLAM HCL 2 MG/2ML IJ SOLN
INTRAMUSCULAR | Status: AC
  Filled 2011-10-04: qty 2

## 2011-10-04 MED ORDER — NITROGLYCERIN 0.2 MG/ML ON CALL CATH LAB
INTRAVENOUS | Status: AC
  Filled 2011-10-04: qty 1

## 2011-10-04 MED ORDER — ASPIRIN EC 81 MG PO TBEC
81.0000 mg | DELAYED_RELEASE_TABLET | Freq: Every day | ORAL | Status: DC
  Administered 2011-10-05: 81 mg via ORAL
  Filled 2011-10-04: qty 1

## 2011-10-04 MED ORDER — DIAZEPAM 5 MG PO TABS
ORAL_TABLET | ORAL | Status: AC
  Filled 2011-10-04: qty 1

## 2011-10-04 MED ORDER — ATENOLOL 50 MG PO TABS
50.0000 mg | ORAL_TABLET | Freq: Two times a day (BID) | ORAL | Status: DC
  Administered 2011-10-04 – 2011-10-05 (×3): 50 mg via ORAL
  Filled 2011-10-04 (×4): qty 1

## 2011-10-04 MED ORDER — HEPARIN (PORCINE) IN NACL 100-0.45 UNIT/ML-% IJ SOLN
1100.0000 [IU]/h | INTRAMUSCULAR | Status: DC
  Administered 2011-10-04: 1100 [IU]/h via INTRAVENOUS
  Filled 2011-10-04 (×2): qty 250

## 2011-10-04 MED ORDER — SODIUM CHLORIDE 0.9 % IJ SOLN: 3.0000 mL | Freq: Two times a day (BID) | INTRAMUSCULAR | Status: DC

## 2011-10-04 MED ORDER — DIGOXIN 125 MCG PO TABS
125.0000 ug | ORAL_TABLET | Freq: Every day | ORAL | Status: DC
  Administered 2011-10-04 – 2011-10-05 (×2): 125 ug via ORAL
  Filled 2011-10-04 (×2): qty 1

## 2011-10-04 MED ORDER — PANTOPRAZOLE SODIUM 40 MG PO TBEC
40.0000 mg | DELAYED_RELEASE_TABLET | Freq: Every day | ORAL | Status: DC
  Administered 2011-10-04: 40 mg via ORAL
  Filled 2011-10-04: qty 1

## 2011-10-04 MED ORDER — HEPARIN (PORCINE) IN NACL 100-0.45 UNIT/ML-% IJ SOLN
1100.0000 [IU]/h | INTRAMUSCULAR | Status: DC
  Administered 2011-10-04: 1100 [IU]/h via INTRAVENOUS
  Filled 2011-10-04 (×3): qty 250

## 2011-10-04 MED ORDER — ONDANSETRON HCL 4 MG/2ML IJ SOLN: 4.0000 mg | Freq: Four times a day (QID) | INTRAMUSCULAR | Status: DC | PRN

## 2011-10-04 MED ORDER — DIAZEPAM 5 MG PO TABS
5.0000 mg | ORAL_TABLET | Freq: Once | ORAL | Status: AC
  Administered 2011-10-04: 5 mg via ORAL

## 2011-10-04 MED ORDER — LEVOTHYROXINE SODIUM 88 MCG PO TABS
88.0000 ug | ORAL_TABLET | Freq: Every day | ORAL | Status: DC
  Administered 2011-10-05: 88 ug via ORAL
  Filled 2011-10-04 (×3): qty 1

## 2011-10-04 MED ORDER — FENTANYL CITRATE 0.05 MG/ML IJ SOLN
INTRAMUSCULAR | Status: AC
  Filled 2011-10-04: qty 2

## 2011-10-04 MED ORDER — WARFARIN SODIUM 5 MG PO TABS
5.0000 mg | ORAL_TABLET | Freq: Once | ORAL | Status: AC
  Administered 2011-10-04: 5 mg via ORAL
  Filled 2011-10-04: qty 1

## 2011-10-04 MED ORDER — MORPHINE SULFATE 2 MG/ML IJ SOLN
2.0000 mg | INTRAMUSCULAR | Status: DC | PRN
  Administered 2011-10-04: 2 mg via INTRAVENOUS
  Filled 2011-10-04: qty 1

## 2011-10-04 MED ORDER — SODIUM CHLORIDE 0.9 % IV SOLN: 250.0000 mL | INTRAVENOUS | Status: DC | PRN

## 2011-10-04 NOTE — Progress Notes (Signed)
ANTICOAGULATION CONSULT NOTE - Follow Up Consult  Pharmacy Consult for Heparin Indication: chest pain/ACS, Afib  Allergies  Allergen Reactions  . Ace Inhibitors     REACTION: cough  . Codeine Phosphate     REACTION: nausea  . Hydrochlorothiazide W-Triamterene     REACTION: cramps    Patient Measurements: Height: 5\' 7"  (170.2 cm) Weight: 175 lb (79.379 kg) IBW/kg (Calculated) : 66.1  Heparin Dosing Weight:   Vital Signs: Temp: 98.9 F (37.2 C) (05/24 1314) Temp src: Oral (05/24 0500) BP: 136/76 mmHg (05/24 1314) Pulse Rate: 60  (05/24 1314)  Labs:  [name] 10/04/11 1242 10/04/11 0850 10/04/11 0505 10/04/11 0037 10/03/11 2346 10/03/11 2336  HGB -- -- -- -- -- 10.7*  HCT -- -- -- -- -- 32.4*  PLT -- -- -- -- -- 230  APTT 136* -- -- -- -- --  LABPROT -- -- -- 17.0* -- --  INR -- -- -- 1.36 -- --  HEPARINUNFRC 0.56 -- -- -- -- --  CREATININE 1.40* -- -- -- -- 1.48*  CKTOTAL -- 126 85 -- 67 --  CKMB -- 2.1 2.2 -- 1.8 --  TROPONINI -- <0.30 <0.30 -- <0.30 --    Estimated Creatinine Clearance: 40.4 ml/min (by C-G formula based on Cr of 1.4).  Assessment: Patient is an 78 y.o M with heparin started on adm for CP.  Home coumadin currently on hold for possible Left heart cath.  Heparin level now back therapeutic at 0.56.  No bleeding noted.  Goal of Therapy:  Heparin level 0.3-0.7 units/ml   Plan:  1) continue current heparin regimen   [name] P 10/04/2011,1:49 PM

## 2011-10-04 NOTE — Interval H&P Note (Signed)
History and Physical Interval Note:  10/04/2011 1:58 PM  [name]  has presented today for surgery, with the diagnosis of chest pain  The various methods of treatment have been discussed with the patient and family. After consideration of risks, benefits and other options for treatment, the patient has consented to  Procedure(s) (LRB): LEFT HEART CATHETERIZATION WITH CORONARY ANGIOGRAM (N/A) as a surgical intervention .  The patients' history has been reviewed, patient examined, no change in status, stable for surgery.  I have reviewed the patients' chart and labs.  Questions were answered to the patient's satisfaction.     Roniyah Llorens

## 2011-10-04 NOTE — Progress Notes (Signed)
Subjective: No CP  No SOB> Objective: Filed Vitals:   10/04/11 0018 10/04/11 0219 10/04/11 0300 10/04/11 0500  BP: 138/67 137/75 143/71 129/68  Pulse: 61 62 65 70  Temp:   97.3 F (36.3 C) 98.4 F (36.9 C)  TempSrc:   Oral Oral  Resp: 24 19 20 20  Height:   5' 7" (1.702 m)   Weight:   175 lb (79.379 kg)   SpO2: 98% 96% 96% 99%   Weight change:   Intake/Output Summary (Last 24 hours) at 10/04/11 0900 Last data filed at 10/04/11 0400  Gross per 24 hour  Intake      0 ml  Output    275 ml  Net   -275 ml    General: Alert, awake, oriented x3, in no acute distress Neck:  JVP is normal Heart: Regular rate and rhythm, without murmurs, rubs, gallops.  Lungs: Clear to auscultation.  No rales or wheezes. Exemities:  No edema.   Neuro: Grossly intact, nonfocal.  Tele:  Afib  Rates controlled. Lab Results: Results for orders placed during the hospital encounter of 10/03/11 (from the past 24 hour(s))  CBC     Status: Abnormal   Collection Time   10/03/11 11:36 PM      Component Value Range   WBC 6.3  4.0 - 10.5 (K/uL)   RBC 3.72 (*) 4.22 - 5.81 (MIL/uL)   Hemoglobin 10.7 (*) 13.0 - 17.0 (g/dL)   HCT 32.4 (*) 39.0 - 52.0 (%)   MCV 87.1  78.0 - 100.0 (fL)   MCH 28.8  26.0 - 34.0 (pg)   MCHC 33.0  30.0 - 36.0 (g/dL)   RDW 15.4  11.5 - 15.5 (%)   Platelets 230  150 - 400 (K/uL)  BASIC METABOLIC PANEL     Status: Abnormal   Collection Time   10/03/11 11:36 PM      Component Value Range   Sodium 135  135 - 145 (mEq/L)   Potassium 4.4  3.5 - 5.1 (mEq/L)   Chloride 99  96 - 112 (mEq/L)   CO2 24  19 - 32 (mEq/L)   Glucose, Bld 192 (*) 70 - 99 (mg/dL)   BUN 24 (*) 6 - 23 (mg/dL)   Creatinine, Ser 1.48 (*) 0.50 - 1.35 (mg/dL)   Calcium 10.0  8.4 - 10.5 (mg/dL)   GFR calc non Af Amer 42 (*) >90 (mL/min)   GFR calc Af Amer 49 (*) >90 (mL/min)  DIGOXIN LEVEL     Status: Abnormal   Collection Time   10/03/11 11:36 PM      Component Value Range   Digoxin Level 0.7 (*) 0.8 - 2.0  (ng/mL)  PRO B NATRIURETIC PEPTIDE     Status: Abnormal   Collection Time   10/03/11 11:46 PM      Component Value Range   Pro B Natriuretic peptide (BNP) 535.5 (*) 0 - 450 (pg/mL)  CARDIAC PANEL(CRET KIN+CKTOT+MB+TROPI)     Status: Normal   Collection Time   10/03/11 11:46 PM      Component Value Range   Total CK 67  7 - 232 (U/L)   CK, MB 1.8  0.3 - 4.0 (ng/mL)   Troponin I <0.30  <0.30 (ng/mL)   Relative Index RELATIVE INDEX IS INVALID  0.0 - 2.5   POCT I-STAT TROPONIN I     Status: Normal   Collection Time   10/03/11 11:57 PM      Component Value Range     Troponin i, poc 0.00  0.00 - 0.08 (ng/mL)   Comment 3           PROTIME-INR     Status: Abnormal   Collection Time   10/04/11 12:37 AM      Component Value Range   Prothrombin Time 17.0 (*) 11.6 - 15.2 (seconds)   INR 1.36  0.00 - 1.49   LIPID PANEL     Status: Abnormal   Collection Time   10/04/11  5:05 AM      Component Value Range   Cholesterol 148  0 - 200 (mg/dL)   Triglycerides 205 (*) <150 (mg/dL)   HDL 28 (*) >39 (mg/dL)   Total CHOL/HDL Ratio 5.3     VLDL 41 (*) 0 - 40 (mg/dL)   LDL Cholesterol 79  0 - 99 (mg/dL)  CARDIAC PANEL(CRET KIN+CKTOT+MB+TROPI)     Status: Normal   Collection Time   10/04/11  5:05 AM      Component Value Range   Total CK 85  7 - 232 (U/L)   CK, MB 2.2  0.3 - 4.0 (ng/mL)   Troponin I <0.30  <0.30 (ng/mL)   Relative Index RELATIVE INDEX IS INVALID  0.0 - 2.5   GLUCOSE, CAPILLARY     Status: Abnormal   Collection Time   10/04/11  8:01 AM      Component Value Range   Glucose-Capillary 150 (*) 70 - 99 (mg/dL)    Studies/Results: Dg Chest 2 View  10/04/2011  *RADIOLOGY REPORT*  Clinical Data: Chest pain.  History of coronary artery disease with prior CABG.  Indwelling pacemaker.  CHEST - 2 VIEW  Comparison: Two-view chest x-ray 06/05/2007, 01/09/2007.  Findings: Right subclavian dual lead pacemaker unchanged and appears intact.  Prior sternotomy for CABG.  Cardiac silhouette mildly  enlarged but stable.  Thoracic aorta tortuous atherosclerotic, unchanged.  Hilar and mediastinal contours otherwise unremarkable.  Lungs clear.  Bronchovascular markings normal.  Pulmonary vascularity normal.  No pneumothorax.  No pleural effusions.  Degenerative changes involving the thoracic spine.  No significant interval change.  IMPRESSION: Stable mild cardiomegaly.  No acute cardiopulmonary disease.  Original Report Authenticated By: THOMAS E. LAWRENCE, M.D.    Medications: I have reviewed the patient's current medications.   Patient Active Hospital Problem List: CAD   Patient with about 25 min of pain yesterday.  What is concerning is that before bypass he had less symptoms.   Has been cathed since surgery.  I cannot see when last cath was.  Told 1 graft down. DIscussed evaluation.  With lack of symptoms in past he is concerned.   WIll recomm L heart cath to define.  CRI:  Follow  REpeat bMet today  Has been hydrated.  Afib.  On heparin now.  Will need to switch to coumadin at d/c.    LOS: 1 day   [name] 10/04/2011, 9:00 AM   

## 2011-10-04 NOTE — H&P (Signed)
Cardiology H&P  Primary Care Povider: [name], MD, MD Primary Cardiologist: Stuckey/Taylor   HPI: [name] is a 78 y.o.male with CAD s/p CABG and PCI as well as chronic afib and heart block s/p PPM who presents to the ED with an episode of chest pain.  The pain began tonight when he got up from bed to go to the bathroom.  He had chest pressure that did not radiate.  Described as pressure/tightness in center of chest.  Took 2 nitro with eventual relief after about 25 minutes.  He has not had any recent chest pain before this episode.  Currently he is pain free.  He has most recently seen Dr. Ladona Ridgel who has placed him on digoxin due to RVR.  He has a history of CAD with CABG in 1999 with SVG to PDA/PLA, SVG to D1, SVG-OM, and LIMA to LAD.  He underwent PCI to the AV Circumflex in 2001 after SVG-OM failed.  His last cath was in 2012 when he was found to have unchanged anatomy with patent circumflex stents and patent LIMA as well as grafts to D1 and PDA/PLA.    PMH: 1. CAD: CABG 1999: LIMA-LAD, SVG-PDA/PLA, SVG-D1, SVG-OM               LHC 2001: SVG-OM occluded, underwent PCI to AV Circumflex               LHC 2012: patent Cx stent, patent SVG-PDA/PLA, SVG-D1, and LIMA-LAD 2. Chronic AFib 3. CHB s/p PPM 4. HTN 5. HLD 6. AAA: 7. BPH 8. Insonmia  Past Surgical History  Procedure Date  . Pacemaker 1980, 1995    medtronic minix [id number]  . Cholecystectomy, laparoscopic   . Prostatectomy     transurethral  . Inguinal herniorrhaphies     bilateral  . Right hip replacement   . Coronary artery bypass graft     1999 stents in 2000  . Penile prosthesis implant 1992  . Transthoracic echocardiogram 12/2006  . Cardiac catheterization 01/31/06, 09/25/10    Family History  Problem Relation Age of Onset  . Alzheimer's disease Mother   . Mental illness Mother     alzheimers  . Heart failure Father   . Stroke Father     cva  . Heart disease Father     chf  . Heart disease Brother     maybe  .  Diabetes Other     several siblings  . Coronary artery disease Brother     cabg  . Alzheimer's disease Sister   . Mental illness Sister     alzheimers  . Cancer Neg Hx     Social History:  reports that he has never smoked. He does not have any smokeless tobacco history on file. He reports that he drinks alcohol. He reports that he does not use illicit drugs.  Allergies:  Allergies  Allergen Reactions  . Ace Inhibitors     REACTION: cough  . Codeine Phosphate     REACTION: nausea  . Hydrochlorothiazide W-Triamterene     REACTION: cramps    No current facility-administered medications for this encounter.   Current Outpatient Prescriptions  Medication Sig Dispense Refill  . amLODipine (NORVASC) 5 MG tablet Take 5 mg by mouth daily.      [name] aspirin 81 MG tablet Take 81 mg by mouth daily.      [name] atenolol (TENORMIN) 50 MG tablet Take 50 mg by mouth 2 (two) times daily.      [name]  digoxin (LANOXIN) 0.125 MG tablet Take 1 tablet (125 mcg total) by mouth daily.  30 tablet  11  . fenofibrate micronized (LOFIBRA) 134 MG capsule Take 134 mg by mouth daily before breakfast.       . levothyroxine (SYNTHROID, LEVOTHROID) 88 MCG tablet Take 88 mcg by mouth daily.      . metFORMIN (GLUCOPHAGE) 850 MG tablet Take 850 mg by mouth 2 (two) times daily with a meal.        . nitroGLYCERIN (NITROSTAT) 0.4 MG SL tablet Place 0.4 mg under the tongue every 5 (five) minutes as needed. For chest pain      . omeprazole (PRILOSEC) 40 MG capsule Take 40 mg by mouth daily as needed. For acid reflux      . pravastatin (PRAVACHOL) 40 MG tablet Take 80 mg by mouth daily.       [name] warfarin (COUMADIN) 3 MG tablet Take 3-4.5 mg by mouth daily. 1 tablet every day except for Wednesday and Friday 1.5 tablets        ROS: A full review of systems is obtained and is negative except as noted in the HPI.  Physical Exam: Blood pressure 138/67, pulse 61, temperature 98.2 F (36.8 C), temperature source Oral, resp. rate 24,  SpO2 98.00%.  GENERAL: no acute distress.  EYES: Extra ocular movements are intact. There is no lid lag. Sclera is anicteric.  ENT: Oropharynx is clear. Dentition is within normal limits.  NECK: Supple. The thyroid is not enlarged.  LYMPH: There are no masses or lymphadenopathy present.  HEART: Regular rate and rhythm with no m/g/r.  Normal S1/S2. No JVD LUNGS: Clear to auscultation There are no rales, rhonchi, or wheezes.  ABDOMEN: Soft, non-tender, and non-distended with normoactive bowel sounds. There is no hepatosplenomegaly.  EXTREMITIES: No clubbing, cyanosis, or edema.  PULSES:  DP/PT pulses were +2 and equal bilaterally.  SKIN: Warm, dry, and intact.  NEUROLOGIC: The patient was oriented to person, place, and time. No overt neurologic deficits were detected.  PSYCH: Normal judgment and insight, mood is appropriate.   Results: Results for orders placed during the hospital encounter of 10/03/11 (from the past 24 hour(s))  CBC     Status: Abnormal   Collection Time   10/03/11 11:36 PM      Component Value Range   WBC 6.3  4.0 - 10.5 (K/uL)   RBC 3.72 (*) 4.22 - 5.81 (MIL/uL)   Hemoglobin 10.7 (*) 13.0 - 17.0 (g/dL)   HCT 16.1 (*) 09.6 - 52.0 (%)   MCV 87.1  78.0 - 100.0 (fL)   MCH 28.8  26.0 - 34.0 (pg)   MCHC 33.0  30.0 - 36.0 (g/dL)   RDW 04.5  40.9 - 81.1 (%)   Platelets 230  150 - 400 (K/uL)  BASIC METABOLIC PANEL     Status: Abnormal   Collection Time   10/03/11 11:36 PM      Component Value Range   Sodium 135  135 - 145 (mEq/L)   Potassium 4.4  3.5 - 5.1 (mEq/L)   Chloride 99  96 - 112 (mEq/L)   CO2 24  19 - 32 (mEq/L)   Glucose, Bld 192 (*) 70 - 99 (mg/dL)   BUN 24 (*) 6 - 23 (mg/dL)   Creatinine, Ser 9.14 (*) 0.50 - 1.35 (mg/dL)   Calcium 78.2  8.4 - 10.5 (mg/dL)   GFR calc non Af Amer 42 (*) >90 (mL/min)   GFR calc Af Amer 49 (*) >90 (  mL/min)  DIGOXIN LEVEL     Status: Abnormal   Collection Time   10/03/11 11:36 PM      Component Value Range   Digoxin Level  0.7 (*) 0.8 - 2.0 (ng/mL)  PRO B NATRIURETIC PEPTIDE     Status: Abnormal   Collection Time   10/03/11 11:46 PM      Component Value Range   Pro B Natriuretic peptide (BNP) 535.5 (*) 0 - 450 (pg/mL)  CARDIAC PANEL(CRET KIN+CKTOT+MB+TROPI)     Status: Normal   Collection Time   10/03/11 11:46 PM      Component Value Range   Total CK 67  7 - 232 (U/L)   CK, MB 1.8  0.3 - 4.0 (ng/mL)   Troponin I <0.30  <0.30 (ng/mL)   Relative Index RELATIVE INDEX IS INVALID  0.0 - 2.5   POCT I-STAT TROPONIN I     Status: Normal   Collection Time   10/03/11 11:57 PM      Component Value Range   Troponin i, poc 0.00  0.00 - 0.08 (ng/mL)   Comment 3           PROTIME-INR     Status: Abnormal   Collection Time   10/04/11 12:37 AM      Component Value Range   Prothrombin Time 17.0 (*) 11.6 - 15.2 (seconds)   INR 1.36  0.00 - 1.49     EKG: AF with RBBB and intermittent demand ventricular pacing CXR: clear  Assessment/Plan: 78 yo WM with CAD and prior CABG as above who presents tonight to the ED with new onset unstable angina 1. Unstable Angina: 25 minutes of pain earlier tonight relieved with nitro - ASA  - heparin ggt - continue BB and statin - follow cardiac enzymes - function study vs LHC in AM depending on what enzymes do and how he does clinically  2. AFib: rate controlled - continue atenolol and digoxin - has missed few coumadin doses, INR 1.36, on heparin ggt for ACS but will need to restart coumadin before discharge  3. Mild Renal Insufficiency: - hold metforming - NS 100cc/hr this AM  4. DM:  - Ha1c - hold metformin, start SSI    Eden Toohey 10/04/2011, 2:16 AM

## 2011-10-04 NOTE — Progress Notes (Signed)
Inpatient Diabetes Program Recommendations  AACE/ADA: New Consensus Statement on Inpatient Glycemic Control (2009)  Target Ranges:  Prepandial:   less than 140 mg/dL      Peak postprandial:   less than 180 mg/dL (1-2 hours)      Critically ill patients:  140 - 180 mg/dL   Reason for Visit: NPO status  Inpatient Diabetes Program Recommendations Correction (SSI): Please change to q 4  hrs while NPO  Note:Pt on ICU Hyperglycemia Protocol.  Pt has 2 cbg's greater than 200 mg/dL yesterday, 16/10/96.  Protocol orders an IV insulin drip be started in this case.  If hyperglycemia due to tube feeds, can start TF coverage, but protocol mandates the IV insulin be started first to get control.

## 2011-10-04 NOTE — H&P (View-Only) (Signed)
Subjective: No CP  No SOB> Objective: Filed Vitals:   10/04/11 0018 10/04/11 0219 10/04/11 0300 10/04/11 0500  BP: 138/67 137/75 143/71 129/68  Pulse: 61 62 65 70  Temp:   97.3 F (36.3 C) 98.4 F (36.9 C)  TempSrc:   Oral Oral  Resp: 24 19 20 20   Height:   5\' 7"  (1.702 m)   Weight:   175 lb (79.379 kg)   SpO2: 98% 96% 96% 99%   Weight change:   Intake/Output Summary (Last 24 hours) at 10/04/11 0900 Last data filed at 10/04/11 0400  Gross per 24 hour  Intake      0 ml  Output    275 ml  Net   -275 ml    General: Alert, awake, oriented x3, in no acute distress Neck:  JVP is normal Heart: Regular rate and rhythm, without murmurs, rubs, gallops.  Lungs: Clear to auscultation.  No rales or wheezes. Exemities:  No edema.   Neuro: Grossly intact, nonfocal.  Tele:  Afib  Rates controlled. Lab Results: Results for orders placed during the hospital encounter of 10/03/11 (from the past 24 hour(s))  CBC     Status: Abnormal   Collection Time   10/03/11 11:36 PM      Component Value Range   WBC 6.3  4.0 - 10.5 (K/uL)   RBC 3.72 (*) 4.22 - 5.81 (MIL/uL)   Hemoglobin 10.7 (*) 13.0 - 17.0 (g/dL)   HCT 16.1 (*) 09.6 - 52.0 (%)   MCV 87.1  78.0 - 100.0 (fL)   MCH 28.8  26.0 - 34.0 (pg)   MCHC 33.0  30.0 - 36.0 (g/dL)   RDW 04.5  40.9 - 81.1 (%)   Platelets 230  150 - 400 (K/uL)  BASIC METABOLIC PANEL     Status: Abnormal   Collection Time   10/03/11 11:36 PM      Component Value Range   Sodium 135  135 - 145 (mEq/L)   Potassium 4.4  3.5 - 5.1 (mEq/L)   Chloride 99  96 - 112 (mEq/L)   CO2 24  19 - 32 (mEq/L)   Glucose, Bld 192 (*) 70 - 99 (mg/dL)   BUN 24 (*) 6 - 23 (mg/dL)   Creatinine, Ser 9.14 (*) 0.50 - 1.35 (mg/dL)   Calcium 78.2  8.4 - 10.5 (mg/dL)   GFR calc non Af Amer 42 (*) >90 (mL/min)   GFR calc Af Amer 49 (*) >90 (mL/min)  DIGOXIN LEVEL     Status: Abnormal   Collection Time   10/03/11 11:36 PM      Component Value Range   Digoxin Level 0.7 (*) 0.8 - 2.0  (ng/mL)  PRO B NATRIURETIC PEPTIDE     Status: Abnormal   Collection Time   10/03/11 11:46 PM      Component Value Range   Pro B Natriuretic peptide (BNP) 535.5 (*) 0 - 450 (pg/mL)  CARDIAC PANEL(CRET KIN+CKTOT+MB+TROPI)     Status: Normal   Collection Time   10/03/11 11:46 PM      Component Value Range   Total CK 67  7 - 232 (U/L)   CK, MB 1.8  0.3 - 4.0 (ng/mL)   Troponin I <0.30  <0.30 (ng/mL)   Relative Index RELATIVE INDEX IS INVALID  0.0 - 2.5   POCT I-STAT TROPONIN I     Status: Normal   Collection Time   10/03/11 11:57 PM      Component Value Range  Troponin i, poc 0.00  0.00 - 0.08 (ng/mL)   Comment 3           PROTIME-INR     Status: Abnormal   Collection Time   10/04/11 12:37 AM      Component Value Range   Prothrombin Time 17.0 (*) 11.6 - 15.2 (seconds)   INR 1.36  0.00 - 1.49   LIPID PANEL     Status: Abnormal   Collection Time   10/04/11  5:05 AM      Component Value Range   Cholesterol 148  0 - 200 (mg/dL)   Triglycerides 409 (*) <150 (mg/dL)   HDL 28 (*) >81 (mg/dL)   Total CHOL/HDL Ratio 5.3     VLDL 41 (*) 0 - 40 (mg/dL)   LDL Cholesterol 79  0 - 99 (mg/dL)  CARDIAC PANEL(CRET KIN+CKTOT+MB+TROPI)     Status: Normal   Collection Time   10/04/11  5:05 AM      Component Value Range   Total CK 85  7 - 232 (U/L)   CK, MB 2.2  0.3 - 4.0 (ng/mL)   Troponin I <0.30  <0.30 (ng/mL)   Relative Index RELATIVE INDEX IS INVALID  0.0 - 2.5   GLUCOSE, CAPILLARY     Status: Abnormal   Collection Time   10/04/11  8:01 AM      Component Value Range   Glucose-Capillary 150 (*) 70 - 99 (mg/dL)    Studies/Results: Dg Chest 2 View  10/04/2011  *RADIOLOGY REPORT*  Clinical Data: Chest pain.  History of coronary artery disease with prior CABG.  Indwelling pacemaker.  CHEST - 2 VIEW  Comparison: Two-view chest x-ray 06/05/2007, 01/09/2007.  Findings: Right subclavian dual lead pacemaker unchanged and appears intact.  Prior sternotomy for CABG.  Cardiac silhouette mildly  enlarged but stable.  Thoracic aorta tortuous atherosclerotic, unchanged.  Hilar and mediastinal contours otherwise unremarkable.  Lungs clear.  Bronchovascular markings normal.  Pulmonary vascularity normal.  No pneumothorax.  No pleural effusions.  Degenerative changes involving the thoracic spine.  No significant interval change.  IMPRESSION: Stable mild cardiomegaly.  No acute cardiopulmonary disease.  Original Report Authenticated By: Arnell Sieving, M.D.    Medications: I have reviewed the patient's current medications.   Patient Active Hospital Problem List: CAD   Patient with about 25 min of pain yesterday.  What is concerning is that before bypass he had less symptoms.   Has been cathed since surgery.  I cannot see when last cath was.  Told 1 graft down. DIscussed evaluation.  With lack of symptoms in past he is concerned.   WIll recomm L heart cath to define.  CRI:  Follow  REpeat bMet today  Has been hydrated.  Afib.  On heparin now.  Will need to switch to coumadin at d/c.    LOS: 1 day   Dietrich Pates 10/04/2011, 9:00 AM

## 2011-10-04 NOTE — CV Procedure (Signed)
Cardiac Catheterization Operative Report  [name] [id number] 5/24/20132:34 PM [name], MD, MD  Procedure Performed:  1. Left Heart Catheterization 2. Selective Coronary Angiography 3. Left ventricular angiogram 4.   SVG angiography 5.   LIMA graft angiography  Operator: Verne Carrow, MD  Indication:   Known CAD, admitted with CP. Cardiac markers negative. Pt has 6V CABG 1999. Last cath 2007 with 4/6 patent grafts (both vein grafts to OM system occluded at that time). Cardiac cath arranged by Dr. Tenny Craw. Pt followed as an outpatient by Dr. [name].                                     Procedure Details: The risks, benefits, complications, treatment options, and expected outcomes were discussed with the patient. The patient and/or family concurred with the proposed plan, giving informed consent. The patient was brought to the cath lab after IV hydration was begun and oral premedication was given. The patient was further sedated with Versed and Fentanyl. The right groin was prepped and draped in the usual manner. Using the modified Seldinger access technique, a 5 French sheath was placed in the right femoral artery. Standard diagnostic catheters were used to perform selective coronary angiography. The JR 4 catheter was used to engage the RCA and both vein grafts. The vein graft to the OM system is known to be occluded and was not engaged. An IMA catheter was used to engage the LIMA graft. A JL-4 was used to engage the left main. A pigtail catheter was used to perform a left ventricular angiogram.  There were no immediate complications. The patient was taken to the recovery area in stable condition.   Hemodynamic Findings: Central aortic pressure: 133/65 Left ventricular pressure: 131/7/18  Angiographic Findings:  Left main: 20% stenosis.   Left Anterior Descending Artery: 100% occlusion after the septal perforator. The mid vessel and distal vessel fills from the IMA graft as  well as from flow retrograde from the grafted Diagonal branch. The diagonal branch fills from the vein graft.   Circumflex Artery: Large caliber system. The first three marginal branches are small in caliber and have mild plaque disease. The fourth OM branch is moderate sized and has mild plaque disease. The AV groove Circumflex has multiple stents in the proximal and mid segments. These stents are patent with no significant restenosis.   Right Coronary Artery: Large, dominant vessel with 20% proximal stenosis, 99% mid stenosis, diffuse 60% distal stenosis. Just before the distal bifurcation, there is a 99% stenosis. The PL branch is completely occluded and fills from the vein graft. The PDA has mild plaque disease and fills from the vein graft.   Graft Anatomy: 6 grafts  SVG sequential to OM2/OM3 is known to be occluded and not injected.   SVG sequential to PLA/PDA is patent with no disease in the body of the vein graft.   SVG to Diagonal is patent. There is a 40% narrowing in the mid body of the vein graft. I was not able to pull up the films from the cath in 2007, but this narrowing is described in the narrative.  LIMA to LAD is patent. This fills the mid and distal LAD.   Left Ventricular Angiogram: LVEF 55%   Impression: 1. Triple vessel CAD s/p 6V CABG with 4/6 patent bypass grafts. The Circumflex system is known to have occluded grafts but there are patent stents  in this           system.  2. No significant progression of CAD since last cath in 2007.  3. Preserved LV systolic function.   Recommendations: Will continue medical management. Follow up with Dr. [name] after discharge.        Complications:  None. The patient tolerated the procedure well.

## 2011-10-04 NOTE — Progress Notes (Signed)
Utilization review completed.  

## 2011-10-04 NOTE — Progress Notes (Signed)
ANTICOAGULATION CONSULT NOTE - Follow Up Consult  Pharmacy Consult for Heparin Indication: chest pain/ACS, Afib  Allergies  Allergen Reactions  . Ace Inhibitors     REACTION: cough  . Codeine Phosphate     REACTION: nausea  . Hydrochlorothiazide W-Triamterene     REACTION: cramps    Patient Measurements: Height: 5\' 7"  (170.2 cm) Weight: 175 lb (79.379 kg) IBW/kg (Calculated) : 66.1  Heparin Dosing Weight:   Vital Signs: Temp: 98.9 F (37.2 C) (05/24 1314) Temp src: Oral (05/24 0500) BP: 133/58 mmHg (05/24 1632) Pulse Rate: 60  (05/24 1632)  Labs:  [name] 10/04/11 1242 10/04/11 0850 10/04/11 0505 10/04/11 0037 10/03/11 2346 10/03/11 2336  HGB -- -- -- -- -- 10.7*  HCT -- -- -- -- -- 32.4*  PLT -- -- -- -- -- 230  APTT 136* -- -- -- -- --  LABPROT -- -- -- 17.0* -- --  INR -- -- -- 1.36 -- --  HEPARINUNFRC 0.56 -- -- -- -- --  CREATININE 1.40* -- -- -- -- 1.48*  CKTOTAL -- 126 85 -- 67 --  CKMB -- 2.1 2.2 -- 1.8 --  TROPONINI -- <0.30 <0.30 -- <0.30 --    Estimated Creatinine Clearance: 40.4 ml/min (by C-G formula based on Cr of 1.4).  Assessment: Patient is an 78 y.o M with heparin started on adm for CP.  S/p cath. No significant progression with CAD. Plan to cont med management. Restart IV heparin/coumadin. Sheath removed at 15:30. Will restart heparin 8 hrs post removal.   Goal of Therapy:  Heparin level 0.3-0.7 units/ml  INR = 2-3 Plan:  1) Restart heparin drip at 1100 units/hr 2) Coumadin 5mg  PO x1  [name] 10/04/2011,4:37 PM

## 2011-10-04 NOTE — ED Provider Notes (Addendum)
History     CSN: [id number]  Arrival date & time 10/03/11  2227   First MD Initiated Contact with Patient 10/03/11 2303      Chief Complaint  Patient presents with  . Chest Pain    (Consider location/radiation/quality/duration/timing/severity/associated sxs/prior treatment) HPI 78 year old [name] presents to emergency room complaining of chest pain. Patient reports around 9:30 he got up after laying in bed watching TV, and had onset of substernal chest pain. Patient denies nausea, diaphoresis, shortness of breath, or radiation of pain. Patient took one nitroglycerin and did not have any improvement. He took 3 baby aspirin and an extra atenolol. He then took a second nitroglycerin about 15 minutes later with resolution of pain. Patient reports he does not have chest pain often, the last time was probably 5 years ago. Patient is followed by Dr. [name] and Dr. Ladona Ridgel for cardiology. Patient has history of CABG with 6 vessel bypass, and has 2 stents. Patient has long-standing pacemaker which was replaced in January. Patient was started on digoxin several months ago. Patient had check up with cardiology earlier this month and was doing well. Patient is currently asymptomatic and without complaints. He denies any fever, cough, leg swelling, prolonged immobilization.   Past Medical History  Diagnosis Date  . Diabetes mellitus, type 2   . CAD (coronary artery disease)   . Hypertension   . Hyperlipidemia   . Atrial fibrillation   . AAA (abdominal aortic aneurysm)     next u/s due 03-2012  . Hypothyroidism   . BPH (benign prostatic hyperplasia)     reports aprocedure (TURP) remotely in [place].Marland KitchenNo futher f/u w/ urology  . GERD (gastroesophageal reflux disease)   . Insomnia     transient  . Vitamin B12 deficiency   . Osteopenia     dexa 2-11    Past Surgical History  Procedure Date  . Pacemaker 1980, 1995    medtronic minix [id number]  . Cholecystectomy, laparoscopic   . Prostatectomy    transurethral  . Inguinal herniorrhaphies     bilateral  . Right hip replacement   . Coronary artery bypass graft     1999 stents in 2000  . Penile prosthesis implant 1992  . Transthoracic echocardiogram 12/2006  . Cardiac catheterization 01/31/06, 09/25/10    Family History  Problem Relation Age of Onset  . Alzheimer's disease Mother   . Mental illness Mother     alzheimers  . Heart failure Father   . Stroke Father     cva  . Heart disease Father     chf  . Heart disease Brother     maybe  . Diabetes Other     several siblings  . Coronary artery disease Brother     cabg  . Alzheimer's disease Sister   . Mental illness Sister     alzheimers  . Cancer Neg Hx     History  Substance Use Topics  . Smoking status: Never Smoker   . Smokeless tobacco: Not on file  . Alcohol Use: Yes     rare      Review of Systems  All other systems reviewed and are negative.    Allergies  Ace inhibitors; Codeine phosphate; and Hydrochlorothiazide w-triamterene  Home Medications   Current Outpatient Rx  Name Route Sig Dispense Refill  . AMLODIPINE BESYLATE 5 MG PO TABS Oral Take 5 mg by mouth daily.    . ASPIRIN 81 MG PO TABS Oral Take 81 mg by mouth daily.    [name]  ATENOLOL 50 MG PO TABS Oral Take 50 mg by mouth 2 (two) times daily.    [name] DIGOXIN 0.125 MG PO TABS Oral Take 1 tablet (125 mcg total) by mouth daily. 30 tablet 11  . FENOFIBRATE MICRONIZED 134 MG PO CAPS Oral Take 134 mg by mouth daily before breakfast.     . LEVOTHYROXINE SODIUM 88 MCG PO TABS Oral Take 88 mcg by mouth daily.    [name] METFORMIN HCL 850 MG PO TABS Oral Take 850 mg by mouth 2 (two) times daily with a meal.      . NITROGLYCERIN 0.4 MG SL SUBL Sublingual Place 0.4 mg under the tongue every 5 (five) minutes as needed. For chest pain    . OMEPRAZOLE 40 MG PO CPDR Oral Take 40 mg by mouth daily as needed. For acid reflux    . PRAVASTATIN SODIUM 40 MG PO TABS Oral Take 40 mg by mouth 2 (two) times daily.     .  WARFARIN SODIUM 3 MG PO TABS Oral Take 3-4.5 mg by mouth daily. 1 tablet every day except for Wednesday and Friday 1.5 tablets      BP 138/67  Pulse 61  Temp(Src) Nicholas.2 F (36.8 C) (Oral)  Resp 24  SpO2 Nicholas%  Physical Exam  Nursing note and vitals reviewed. Constitutional: He is oriented to person, place, and time. He appears well-developed and well-nourished.  HENT:  Head: Normocephalic and atraumatic.  Nose: Nose normal.  Mouth/Throat: Oropharynx is clear and moist.  Eyes: Conjunctivae and EOM are normal. Pupils are equal, round, and reactive to light.  Neck: Normal range of motion. Neck supple. No JVD present. No tracheal deviation present. No thyromegaly present.  Cardiovascular: Normal rate, regular rhythm, normal heart sounds and intact distal pulses.  Exam reveals no gallop and no friction rub.   No murmur heard. Pulmonary/Chest: Effort normal and breath sounds normal. No stridor. No respiratory distress. He has no wheezes. He has no rales. He exhibits no tenderness.  Abdominal: Soft. Bowel sounds are normal. He exhibits no distension and no mass. There is no tenderness. There is no rebound and no guarding.  Musculoskeletal: Normal range of motion. He exhibits edema (2+edema feet). He exhibits no tenderness.  Lymphadenopathy:    He has no cervical adenopathy.  Neurological: He is oriented to person, place, and time. He exhibits normal muscle tone. Coordination normal.  Skin: Skin is dry. No rash noted. No erythema. No pallor.  Psychiatric: He has a normal mood and affect. His behavior is normal. Judgment and thought content normal.    ED Course  Procedures (including critical care time)  Labs Reviewed  CBC - Abnormal; Notable for the following:    RBC 3.72 (*)    Hemoglobin 10.7 (*)    HCT 32.4 (*)    All other components within normal limits  BASIC METABOLIC PANEL - Abnormal; Notable for the following:    Glucose, Bld 192 (*)    BUN 24 (*)    Creatinine, Ser 1.48 (*)     GFR calc non Af Amer 42 (*)    GFR calc Af Amer 49 (*)    All other components within normal limits  PRO B NATRIURETIC PEPTIDE - Abnormal; Notable for the following:    Pro B Natriuretic peptide (BNP) 535.5 (*)    All other components within normal limits  DIGOXIN LEVEL - Abnormal; Notable for the following:    Digoxin Level 0.7 (*)    All other components within normal  limits  PROTIME-INR - Abnormal; Notable for the following:    Prothrombin Time 17.0 (*)    All other components within normal limits  CARDIAC PANEL(CRET KIN+CKTOT+MB+TROPI)  POCT I-STAT TROPONIN I   Dg Chest 2 View  10/04/2011  *RADIOLOGY REPORT*  Clinical Data: Chest pain.  History of coronary artery disease with prior CABG.  Indwelling pacemaker.  CHEST - 2 VIEW  Comparison: Two-view chest x-ray 06/05/2007, 01/09/2007.  Findings: Right subclavian dual lead pacemaker unchanged and appears intact.  Prior sternotomy for CABG.  Cardiac silhouette mildly enlarged but stable.  Thoracic aorta tortuous atherosclerotic, unchanged.  Hilar and mediastinal contours otherwise unremarkable.  Lungs clear.  Bronchovascular markings normal.  Pulmonary vascularity normal.  No pneumothorax.  No pleural effusions.  Degenerative changes involving the thoracic spine.  No significant interval change.  IMPRESSION: Stable mild cardiomegaly.  No acute cardiopulmonary disease.  Original Report Authenticated By: Arnell Sieving, M.D.    Date: 10/04/2011  Rate: 71   Rhythm: atrial fibrillation and ventricular paced complexes  QRS Axis: normal  Intervals: atrial fibrillation  ST/T Wave abnormalities: nonspecific T wave changes  Conduction Disutrbances:right bundle branch block  Narrative Interpretation:   Old EKG Reviewed: unchanged   1. Chest pain       MDM  78 yo [name] with history of coronary disease, A. fib and pacemaker who had onset of chest pain tonight. Will get standard labs chest x-ray. Will discuss with  cardiology.        Olivia Mackie, MD 10/04/11 1610  Olivia Mackie, MD 10/11/11 [phone]

## 2011-10-04 NOTE — Progress Notes (Signed)
ANTICOAGULATION CONSULT NOTE - Initial Consult  Pharmacy Consult for heparin Indication: USAP/Afib  Allergies  Allergen Reactions  . Ace Inhibitors     REACTION: cough  . Codeine Phosphate     REACTION: nausea  . Hydrochlorothiazide W-Triamterene     REACTION: cramps    Patient Measurements: Height: 5\' 7"  (170.2 cm) Weight: 175 lb (79.379 kg) IBW/kg (Calculated) : 66.1   Vital Signs: Temp: 97.3 F (36.3 C) (05/24 0300) Temp src: Oral (05/24 0300) BP: 143/71 mmHg (05/24 0300) Pulse Rate: 65  (05/24 0300)  Labs:  Basename 10/04/11 0037 10/03/11 2346 10/03/11 2336  HGB -- -- 10.7*  HCT -- -- 32.4*  PLT -- -- 230  APTT -- -- --  LABPROT 17.0* -- --  INR 1.36 -- --  HEPARINUNFRC -- -- --  CREATININE -- -- 1.48*  CKTOTAL -- 67 --  CKMB -- 1.8 --  TROPONINI -- <0.30 --    Estimated Creatinine Clearance: 38.2 ml/min (by C-G formula based on Cr of 1.48).   Medical History: Past Medical History  Diagnosis Date  . Diabetes mellitus, type 2   . CAD (coronary artery disease)   . Hypertension   . Hyperlipidemia   . Atrial fibrillation   . AAA (abdominal aortic aneurysm)     next u/s due 03-2012  . Hypothyroidism   . BPH (benign prostatic hyperplasia)     reports aprocedure (TURP) remotely in [place].Marland KitchenNo futher f/u w/ urology  . GERD (gastroesophageal reflux disease)   . Insomnia     transient  . Vitamin B12 deficiency   . Osteopenia     dexa 2-11    Medications:  Prescriptions prior to admission  Medication Sig Dispense Refill  . amLODipine (NORVASC) 5 MG tablet Take 5 mg by mouth daily.      [name] aspirin 81 MG tablet Take 81 mg by mouth daily.      [name] atenolol (TENORMIN) 50 MG tablet Take 50 mg by mouth 2 (two) times daily.      . digoxin (LANOXIN) 0.125 MG tablet Take 1 tablet (125 mcg total) by mouth daily.  30 tablet  11  . fenofibrate micronized (LOFIBRA) 134 MG capsule Take 134 mg by mouth daily before breakfast.       . levothyroxine (SYNTHROID, LEVOTHROID)  88 MCG tablet Take 88 mcg by mouth daily.      . metFORMIN (GLUCOPHAGE) 850 MG tablet Take 850 mg by mouth 2 (two) times daily with a meal.        . nitroGLYCERIN (NITROSTAT) 0.4 MG SL tablet Place 0.4 mg under the tongue every 5 (five) minutes as needed. For chest pain      . omeprazole (PRILOSEC) 40 MG capsule Take 40 mg by mouth daily as needed. For acid reflux      . pravastatin (PRAVACHOL) 40 MG tablet Take 80 mg by mouth daily.       [name] warfarin (COUMADIN) 3 MG tablet Take 3-4.5 mg by mouth daily. 1 tablet every day except for Wednesday and Friday 1.5 tablets       Scheduled:    . amLODipine  5 mg Oral Daily  . aspirin  324 mg Oral NOW   Or  . aspirin  300 mg Rectal NOW  . aspirin EC  81 mg Oral Daily  . atenolol  50 mg Oral BID  . digoxin  125 mcg Oral Daily  . heparin  4,000 Units Intravenous Once  . insulin aspart  0-15 Units  Subcutaneous TID WC  . levothyroxine  88 mcg Oral Q0600  . pantoprazole  40 mg Oral Q1200  . simvastatin  40 mg Oral q1800    Assessment: 78yo male c/o chest pressure/tightness relieved after SL NTG x2, to begin heparin for unstable angina; of note, pt on chronic Coumadin for Afib but missed a few doses and INR close to baseline, to resume prior to discharge.  Goal of Therapy:  Heparin level 0.3-0.7 units/ml Monitor platelets by anticoagulation protocol: Yes   Plan:  Will give heparin bolus of 4000 units IV x1 followed by gtt at 1100 units/hr and monitor heparin levels and CBC.  Colleen Can PharmD BCPS 10/04/2011,3:51 AM

## 2011-10-05 DIAGNOSIS — R079 Chest pain, unspecified: Secondary | ICD-10-CM | POA: Diagnosis not present

## 2011-10-05 LAB — HEPARIN LEVEL (UNFRACTIONATED): Heparin Unfractionated: 0.36 IU/mL (ref 0.30–0.70)

## 2011-10-05 LAB — BASIC METABOLIC PANEL
CO2: 21 mEq/L (ref 19–32)
Calcium: 8.5 mg/dL (ref 8.4–10.5)
GFR calc Af Amer: 58 mL/min — ABNORMAL LOW (ref >90)
Sodium: 133 mEq/L — ABNORMAL LOW (ref 135–145)

## 2011-10-05 LAB — CBC
Hemoglobin: 10 g/dL — ABNORMAL LOW (ref 13.0–17.0)
MCH: 28.2 pg (ref 26.0–34.0)
MCV: 87.9 fL (ref 78.0–100.0)
RBC: 3.54 MIL/uL — ABNORMAL LOW (ref 4.22–5.81)

## 2011-10-05 LAB — GLUCOSE, CAPILLARY: Glucose-Capillary: 194 mg/dL — ABNORMAL HIGH (ref 70–99)

## 2011-10-05 MED ORDER — METFORMIN HCL 850 MG PO TABS: 850.0000 mg | ORAL_TABLET | Freq: Two times a day (BID) | ORAL | Status: DC

## 2011-10-05 MED ORDER — WARFARIN SODIUM 5 MG PO TABS
5.0000 mg | ORAL_TABLET | Freq: Once | ORAL | Status: DC
  Filled 2011-10-05: qty 1

## 2011-10-05 NOTE — Discharge Summary (Signed)
CARDIOLOGY DISCHARGE SUMMARY   Patient ID: [name] MRN: [id number] DOB/AGE: December 24, 1925 78 y.o.  Admit date: 10/03/2011 Discharge date: 10/05/2011  Primary Discharge Diagnosis:  Anginal pain, medical therapy recommended Secondary Discharge Diagnosis:  . Diabetes mellitus, type 2   . CAD (coronary artery disease)   . Hypertension   . Hyperlipidemia   . Atrial fibrillation   . AAA (abdominal aortic aneurysm)     next u/s due 03-2012  . Hypothyroidism   . BPH (benign prostatic hyperplasia)     reports aprocedure (TURP) remotely in [place].Marland KitchenNo futher f/u w/ urology  . GERD (gastroesophageal reflux disease)   . Insomnia     transient  . Vitamin B12 deficiency   . Osteopenia     dexa 2-11    Procedure Performed:  1. Left Heart Catheterization 2. Selective Coronary Angiography 3. Left ventricular angiogram       4. SVG angiography        5. LIMA graft angiography   Hospital Course: Mr. Derise is an 78 year old male with a history of coronary artery disease. He had an episode of chest pain that was prolonged and came to the hospital where he was admitted for further evaluation and treatment.  His enzymes were negative for MI. His INR was checked and was sub-therapeutic so cardiac catheterization could be performed. This was done on 10/04/2011 with results described below. Medical therapy was recommended. He tolerated the procedure well. Dye use was minimized because of his renal insufficiency. His Coumadin was restarted post-procedure. His blood sugars were controlled by sliding scale insulin and his metformin was held.  On 10/05/2011, he was seen by Dr. Tenny Craw. His cardiac enzymes had remained negative and his pain had resolved. His repeat creatinine is unchanged. His lipid profile showed dyslipidemia with an LDL of 79 and an HDL of 28. He is to discuss med changes with Dr. [name]. He is considered stable for discharge on 10/05/2011, to follow up as an outpatient.  Labs:   Lab  Results  Component Value Date   WBC 5.1 10/05/2011   HGB 10.0* 10/05/2011   HCT 31.1* 10/05/2011   MCV 87.9 10/05/2011   PLT 205 10/05/2011    Lab 10/04/11 1242  NA 136  K 4.1  CL 104  CO2 20  BUN 19  CREATININE 1.40*  CALCIUM 8.8  PROT --  BILITOT --  ALKPHOS --  ALT --  AST --  GLUCOSE 133*    Basename 10/04/11 1620 10/04/11 0850 10/04/11 0505  CKTOTAL 134 126 85  CKMB 1.9 2.1 2.2  CKMBINDEX -- -- --  TROPONINI <0.30 <0.30 <0.30   Lipid Panel     Component Value Date/Time   CHOL 148 10/04/2011 0505   TRIG 205* 10/04/2011 0505   HDL 28* 10/04/2011 0505   CHOLHDL 5.3 10/04/2011 0505   VLDL 41* 10/04/2011 0505   LDLCALC 79 10/04/2011 0505    Pro B Natriuretic peptide (BNP)  Date/Time Value Range Status  10/03/2011 11:46 PM 535.5* 0-450 (pg/mL) Final  01/08/2007  6:35 PM 112.0*  Final    Basename 10/04/11 1702  INR 1.51*      Radiology: Dg Chest 2 View 10/04/2011  *RADIOLOGY REPORT*  Clinical Data: Chest pain.  History of coronary artery disease with prior CABG.  Indwelling pacemaker.  CHEST - 2 VIEW  Comparison: Two-view chest x-ray 06/05/2007, 01/09/2007.  Findings: Right subclavian dual lead pacemaker unchanged and appears intact.  Prior sternotomy for CABG.  Cardiac silhouette mildly enlarged  but stable.  Thoracic aorta tortuous atherosclerotic, unchanged.  Hilar and mediastinal contours otherwise unremarkable.  Lungs clear.  Bronchovascular markings normal.  Pulmonary vascularity normal.  No pneumothorax.  No pleural effusions.  Degenerative changes involving the thoracic spine.  No significant interval change.  IMPRESSION: Stable mild cardiomegaly.  No acute cardiopulmonary disease.  Original Report Authenticated By: Arnell Sieving, M.D.    Cardiac Cath: 10/04/2011 Left main: 20% stenosis.  Left Anterior Descending Artery: 100% occlusion after the septal perforator. The mid vessel and distal vessel fills from the IMA graft as well as from flow retrograde from the  grafted Diagonal branch. The diagonal branch fills from the vein graft.  Circumflex Artery: Large caliber system. The first three marginal branches are small in caliber and have mild plaque disease. The fourth OM branch is moderate sized and has mild plaque disease. The AV groove Circumflex has multiple stents in the proximal and mid segments. These stents are patent with no significant restenosis.  Right Coronary Artery: Large, dominant vessel with 20% proximal stenosis, 99% mid stenosis, diffuse 60% distal stenosis. Just before the distal bifurcation, there is a 99% stenosis. The PL branch is completely occluded and fills from the vein graft. The PDA has mild plaque disease and fills from the vein graft.  Graft Anatomy: 6 grafts  SVG sequential to OM2/OM3 is known to be occluded and not injected.  SVG sequential to PLA/PDA is patent with no disease in the body of the vein graft.  SVG to Diagonal is patent. There is a 40% narrowing in the mid body of the vein graft. I was not able to pull up the films from the cath in 2007, but this narrowing is described in the narrative.  LIMA to LAD is patent. This fills the mid and distal LAD.  Left Ventricular Angiogram: LVEF 55%  Impression:  1. Triple vessel CAD s/p 6V CABG with 4/6 patent bypass grafts. The Circumflex system is known to have occluded grafts but there are patent stents in this system.  2. No significant progression of CAD since last cath in 2007.  3. Preserved LV systolic function.  Recommendations: Will continue medical management. Follow up with Dr. [name] after discharge.   EKG:  04-Oct-2011 05:28:16  Atrial fibrillation with occasional ventricular-paced complexes Right bundle branch block T wave abnormality, consider inferolateral ischemia Demand ventricular pacing Vent. rate 64 BPM PR interval * ms QRS duration 148 ms QT/QTc 460/474 ms P-R-T axes * 70 -50  FOLLOW UP PLANS AND APPOINTMENTS Allergies  Allergen Reactions  .  Ace Inhibitors     REACTION: cough  . Codeine Phosphate     REACTION: nausea  . Hydrochlorothiazide W-Triamterene     REACTION: cramps   Medication List  As of 10/05/2011 11:55 AM   TAKE these medications         amLODipine 5 MG tablet   Commonly known as: NORVASC   Take 5 mg by mouth daily.      aspirin 81 MG tablet   Take 81 mg by mouth daily.      atenolol 50 MG tablet   Commonly known as: TENORMIN   Take 50 mg by mouth 2 (two) times daily.      digoxin 0.125 MG tablet   Commonly known as: LANOXIN   Take 1 tablet (125 mcg total) by mouth daily.      fenofibrate micronized 134 MG capsule   Commonly known as: LOFIBRA   Take 134 mg by mouth daily  before breakfast.      levothyroxine 88 MCG tablet   Commonly known as: SYNTHROID, LEVOTHROID   Take 88 mcg by mouth daily.      metFORMIN 850 MG tablet   Commonly known as: GLUCOPHAGE   Take 1 tablet (850 mg total) by mouth 2 (two) times daily with a meal. HOLD for 2 days, restart on 10/07/2011.      nitroGLYCERIN 0.4 MG SL tablet   Commonly known as: NITROSTAT   Place 0.4 mg under the tongue every 5 (five) minutes as needed. For chest pain      omeprazole 40 MG capsule   Commonly known as: PRILOSEC   Take 40 mg by mouth daily as needed. For acid reflux      pravastatin 40 MG tablet   Commonly known as: PRAVACHOL   Take 80 mg by mouth daily.      warfarin 3 MG tablet   Commonly known as: COUMADIN   Take 3-4.5 mg by mouth daily. 1 tablet every day except for Wednesday and Friday 1.5 tablets             Follow-up Information    Follow up with Shawnie Pons, MD. (The office will call)    Contact information:   [address] [place] [address] [phone]       Follow up with [facility] [address] (BMET and INR next week, the office will call.)    Contact information:   [address] [place] [address]         BRING ALL  MEDICATIONS WITH YOU TO FOLLOW UP APPOINTMENTS  Time spent with patient to include physician time:  36 min Signed: Theodore Demark 10/05/2011, 11:42 AM Co-Sign MD

## 2011-10-05 NOTE — Progress Notes (Signed)
ANTICOAGULATION CONSULT NOTE - Follow Up Consult  Pharmacy Consult for heparin Indication: Afib/CAD  Labs:  Basename 10/05/11 0530 10/04/11 1702 10/04/11 1620 10/04/11 1242 10/04/11 0850 10/04/11 0505 10/04/11 0037 10/03/11 2336  HGB 10.0* -- -- -- -- -- -- 10.7*  HCT 31.1* -- -- -- -- -- -- 32.4*  PLT 205 -- -- -- -- -- -- 230  APTT -- -- -- 136* -- -- -- --  LABPROT -- 18.5* -- -- -- -- 17.0* --  INR -- 1.51* -- -- -- -- 1.36 --  HEPARINUNFRC 0.36 -- -- 0.56 -- -- -- --  CREATININE -- -- -- 1.40* -- -- -- 1.48*  CKTOTAL -- -- 134 -- 126 85 -- --  CKMB -- -- 1.9 -- 2.1 2.2 -- --  TROPONINI -- -- <0.30 -- <0.30 <0.30 -- --    Assessment/Plan: 78yo male therapeutic on heparin after resumed post-cath.  Will continue heparin at current rate and confirm stable with additional level.   Colleen Can PharmD BCPS 10/05/2011,7:24 AM

## 2011-10-05 NOTE — Discharge Instructions (Signed)
PLEASE REMEMBER TO BRING ALL OF YOUR MEDICATIONS TO EACH OF YOUR FOLLOW-UP OFFICE VISITS.  PLEASE ATTEND ALL SCHEDULED FOLLOW-UP APPOINTMENTS.   Activity: Increase activity slowly as tolerated. You may shower, but no soaking baths (or swimming) for 1 week. No driving for 2 days. No lifting over 5 lbs for 1 week. No sexual activity for 1 week.   You May Return to Work: in 1 week (if applicable)  Wound Care: You may wash cath site gently with soap and water. Keep cath site clean and dry. If you notice pain, swelling, bleeding or pus at your cath site, please call [phone].    Groin Site Care Refer to this sheet in the next few weeks. These instructions provide you with information on caring for yourself after your procedure. Your caregiver may also give you more specific instructions. Your treatment has been planned according to current medical practices, but problems sometimes occur. Call your caregiver if you have any problems or questions after your procedure. HOME CARE INSTRUCTIONS  You may shower 24 hours after the procedure. Remove the bandage (dressing) and gently wash the site with plain soap and water. Gently pat the site dry.   Do not apply powder or lotion to the site.   Do not sit in a bathtub, swimming pool, or whirlpool for 5 to 7 days.   No bending, squatting, or lifting anything over 10 pounds (4.5 kg) as directed by your caregiver.   Inspect the site at least twice daily.   Do not drive home if you are discharged the same day of the procedure. Have someone else drive you.   You may drive 24 hours after the procedure unless otherwise instructed by your caregiver.  What to expect:  Any bruising will usually fade within 1 to 2 weeks.   Blood that collects in the tissue (hematoma) may be painful to the touch. It should usually decrease in size and tenderness within 1 to 2 weeks.  SEEK IMMEDIATE MEDICAL CARE IF:  You have unusual pain at the groin site or down the  affected leg.   You have redness, warmth, swelling, or pain at the groin site.   You have drainage (other than a small amount of blood on the dressing).   You have chills.   You have a fever or persistent symptoms for more than 72 hours.   You have a fever and your symptoms suddenly get worse.   Your leg becomes pale, cool, tingly, or numb.   You have heavy bleeding from the site. Hold pressure on the site.  Document Released: 06/01/2010 Document Revised: 04/18/2011 Document Reviewed: 06/01/2010 [employer] Patient Information 2012 [employer].  

## 2011-10-05 NOTE — Progress Notes (Signed)
Subjective: No CP  No SOB. Objective: Filed Vitals:   10/04/11 2309 10/05/11 0600 10/05/11 0956 10/05/11 0957  BP: 110/62 119/69 120/61   Pulse: 60 59  61  Temp: 98 F (36.7 C) 98.8 F (37.1 C)    TempSrc: Oral     Resp: 18 14    Height:      Weight:      SpO2: 97% 98%     Weight change:   Intake/Output Summary (Last 24 hours) at 10/05/11 1110 Last data filed at 10/05/11 0816  Gross per 24 hour  Intake   1217 ml  Output    925 ml  Net    292 ml    General: Alert, awake, oriented x3, in no acute distress Neck:  JVP is normal Heart: Regular rate and rhythm, without murmurs, rubs, gallops.  Lungs: Clear to auscultation.  No rales or wheezes. Exemities:  No edema.   Neuro: Grossly intact, nonfocal.  Tele:  SR. Lab Results: Results for orders placed during the hospital encounter of 10/03/11 (from the past 24 hour(s))  GLUCOSE, CAPILLARY     Status: Abnormal   Collection Time   10/04/11 11:53 AM      Component Value Range   Glucose-Capillary 118 (*) 70 - 99 (mg/dL)  HEPARIN LEVEL (UNFRACTIONATED)     Status: Normal   Collection Time   10/04/11 12:42 PM      Component Value Range   Heparin Unfractionated 0.56  0.30 - 0.70 (IU/mL)  BASIC METABOLIC PANEL     Status: Abnormal   Collection Time   10/04/11 12:42 PM      Component Value Range   Sodium 136  135 - 145 (mEq/L)   Potassium 4.1  3.5 - 5.1 (mEq/L)   Chloride 104  96 - 112 (mEq/L)   CO2 20  19 - 32 (mEq/L)   Glucose, Bld 133 (*) 70 - 99 (mg/dL)   BUN 19  6 - 23 (mg/dL)   Creatinine, Ser 0.98 (*) 0.50 - 1.35 (mg/dL)   Calcium 8.8  8.4 - 11.9 (mg/dL)   GFR calc non Af Amer 45 (*) >90 (mL/min)   GFR calc Af Amer 52 (*) >90 (mL/min)  APTT     Status: Abnormal   Collection Time   10/04/11 12:42 PM      Component Value Range   aPTT 136 (*) 24 - 37 (seconds)  POCT ACTIVATED CLOTTING TIME     Status: Normal   Collection Time   10/04/11  2:33 PM      Component Value Range   Activated Clotting Time 133    GLUCOSE,  CAPILLARY     Status: Abnormal   Collection Time   10/04/11  3:23 PM      Component Value Range   Glucose-Capillary 101 (*) 70 - 99 (mg/dL)  CARDIAC PANEL(CRET KIN+CKTOT+MB+TROPI)     Status: Normal   Collection Time   10/04/11  4:20 PM      Component Value Range   Total CK 134  7 - 232 (U/L)   CK, MB 1.9  0.3 - 4.0 (ng/mL)   Troponin I <0.30  <0.30 (ng/mL)   Relative Index 1.4  0.0 - 2.5   GLUCOSE, CAPILLARY     Status: Abnormal   Collection Time   10/04/11  4:25 PM      Component Value Range   Glucose-Capillary 104 (*) 70 - 99 (mg/dL)  PROTIME-INR     Status: Abnormal   Collection  Time   10/04/11  5:02 PM      Component Value Range   Prothrombin Time 18.5 (*) 11.6 - 15.2 (seconds)   INR 1.51 (*) 0.00 - 1.49   GLUCOSE, CAPILLARY     Status: Abnormal   Collection Time   10/04/11  9:06 PM      Component Value Range   Glucose-Capillary 247 (*) 70 - 99 (mg/dL)  CBC     Status: Abnormal   Collection Time   10/05/11  5:30 AM      Component Value Range   WBC 5.1  4.0 - 10.5 (K/uL)   RBC 3.54 (*) 4.22 - 5.81 (MIL/uL)   Hemoglobin 10.0 (*) 13.0 - 17.0 (g/dL)   HCT 23.7 (*) 62.8 - 52.0 (%)   MCV 87.9  78.0 - 100.0 (fL)   MCH 28.2  26.0 - 34.0 (pg)   MCHC 32.2  30.0 - 36.0 (g/dL)   RDW 31.5  17.6 - 16.0 (%)   Platelets 205  150 - 400 (K/uL)  HEPARIN LEVEL (UNFRACTIONATED)     Status: Normal   Collection Time   10/05/11  5:30 AM      Component Value Range   Heparin Unfractionated 0.36  0.30 - 0.70 (IU/mL)  GLUCOSE, CAPILLARY     Status: Abnormal   Collection Time   10/05/11  8:12 AM      Component Value Range   Glucose-Capillary 194 (*) 70 - 99 (mg/dL)    Studies/Results: Dg Chest 2 View  10/04/2011  *RADIOLOGY REPORT*  Clinical Data: Chest pain.  History of coronary artery disease with prior CABG.  Indwelling pacemaker.  CHEST - 2 VIEW  Comparison: Two-view chest x-ray 06/05/2007, 01/09/2007.  Findings: Right subclavian dual lead pacemaker unchanged and appears intact.  Prior  sternotomy for CABG.  Cardiac silhouette mildly enlarged but stable.  Thoracic aorta tortuous atherosclerotic, unchanged.  Hilar and mediastinal contours otherwise unremarkable.  Lungs clear.  Bronchovascular markings normal.  Pulmonary vascularity normal.  No pneumothorax.  No pleural effusions.  Degenerative changes involving the thoracic spine.  No significant interval change.  IMPRESSION: Stable mild cardiomegaly.  No acute cardiopulmonary disease.  Original Report Authenticated By: Arnell Sieving, M.D.    Medications: I have reviewed the patient's current medications.   Patient Active Hospital Problem List: Chest pain (10/04/2011)   Assessment: Cath shows stable disease.  Not sure cause  F/U as outpatient\\  CAD  Stable disease.  Afib:  Will resume coumadin today  F/U as outpatient.  Set up for BMET later this week and recheck INR.     LOS: 2 days   Dietrich Pates 10/05/2011, 11:10 AM

## 2011-10-08 ENCOUNTER — Other Ambulatory Visit: Payer: Self-pay | Admitting: Cardiology

## 2011-10-08 LAB — GLUCOSE, CAPILLARY: Glucose-Capillary: 144 mg/dL — ABNORMAL HIGH (ref 70–99)

## 2012-01-03 ENCOUNTER — Other Ambulatory Visit: Payer: Self-pay | Admitting: Internal Medicine

## 2012-01-03 NOTE — Telephone Encounter (Signed)
Refill done.  

## 2012-01-22 ENCOUNTER — Other Ambulatory Visit: Payer: Self-pay | Admitting: Cardiology

## 2012-02-25 ENCOUNTER — Other Ambulatory Visit: Payer: Self-pay | Admitting: Internal Medicine

## 2012-02-26 NOTE — Telephone Encounter (Signed)
Pt hasn't been seen within a year. OK to refill?

## 2012-02-27 NOTE — Telephone Encounter (Signed)
Refill done.  

## 2012-02-27 NOTE — Telephone Encounter (Signed)
Send a month supply, no RF, schedule a OV

## 2012-02-27 NOTE — Telephone Encounter (Signed)
OV 09/19/11. Atenolol-Historical med can't see when filled but not by you.  PLz advise    MW

## 2012-03-13 ENCOUNTER — Other Ambulatory Visit: Payer: Self-pay | Admitting: Internal Medicine

## 2012-03-13 NOTE — Telephone Encounter (Signed)
Pt has not been seen within a year. OK to refill? 

## 2012-03-18 NOTE — Telephone Encounter (Signed)
Refill done.  

## 2012-03-20 DIAGNOSIS — Z23 Encounter for immunization: Secondary | ICD-10-CM | POA: Diagnosis not present

## 2012-05-04 ENCOUNTER — Encounter (INDEPENDENT_AMBULATORY_CARE_PROVIDER_SITE_OTHER): Payer: Medicare Other

## 2012-05-04 DIAGNOSIS — I714 Abdominal aortic aneurysm, without rupture: Secondary | ICD-10-CM

## 2012-06-03 ENCOUNTER — Ambulatory Visit (INDEPENDENT_AMBULATORY_CARE_PROVIDER_SITE_OTHER): Payer: Medicare Other | Admitting: Cardiology

## 2012-06-03 ENCOUNTER — Encounter: Payer: Self-pay | Admitting: Cardiology

## 2012-06-03 VITALS — BP 146/80 | HR 65 | Ht 67.0 in | Wt 176.0 lb

## 2012-06-03 DIAGNOSIS — E785 Hyperlipidemia, unspecified: Secondary | ICD-10-CM | POA: Diagnosis not present

## 2012-06-03 DIAGNOSIS — D509 Iron deficiency anemia, unspecified: Secondary | ICD-10-CM | POA: Diagnosis not present

## 2012-06-03 DIAGNOSIS — I2510 Atherosclerotic heart disease of native coronary artery without angina pectoris: Secondary | ICD-10-CM | POA: Diagnosis not present

## 2012-06-03 DIAGNOSIS — I4891 Unspecified atrial fibrillation: Secondary | ICD-10-CM | POA: Diagnosis not present

## 2012-06-03 NOTE — Assessment & Plan Note (Signed)
Since I last saw the  patient is continued to do quite well.  He's not had chest pain over a year. He continues to get along clinically quite well. He does sleep feel some irregularity of his pulse, but he is in atrial fibrillation overriding his pacemaker. He remains on warfarin anticoagulation. We will have him followup with Dr. Mariah Milling as his brother and sister-in-law are both patient's of Dr. Gerald Leitz as well. We will not change any of his current medications.

## 2012-06-03 NOTE — Patient Instructions (Addendum)
Your physician recommends that you have lab work today: LIPID, LIVER and Digoxin level  Your physician wants you to follow-up in: 6 MONTHS with Dr Mariah Milling.  You will receive a reminder letter in the mail two months in advance. If you don't receive a letter, please call our office to schedule the follow-up appointment.  Your physician recommends that you continue on your current medications as directed. Please refer to the Current Medication list given to you today.

## 2012-06-03 NOTE — Progress Notes (Signed)
HPI:  The patient returns in followup. He does feel some palpitations, but actually feels quite well. He only feels these by taking his pulse. He denies any chest pain. He scheduled to see Dr. Ladona Ridgel in approximately one more month. He also had a abdominal ultrasound performed, and this revealed an abdominal aneurysm 2.5 x 2.7. We gave him a copy of his report today. He denies any chest pain or further cardiac symptoms. He's been on long-term cholesterol management. He would like to see Dr. Alvester Morin in followup.  Current Outpatient Prescriptions  Medication Sig Dispense Refill  . amLODipine (NORVASC) 5 MG tablet Take 5 mg by mouth daily.      [name] aspirin 81 MG tablet Take 81 mg by mouth daily.      [name] atenolol (TENORMIN) 50 MG tablet Take 50 mg by mouth 2 (two) times daily.      . digoxin (LANOXIN) 0.125 MG tablet Take 1 tablet (125 mcg total) by mouth daily.  30 tablet  11  . fenofibrate micronized (LOFIBRA) 134 MG capsule TAKE ONE CAPSULE BY MOUTH EVERY DAY  30 capsule  0  . levothyroxine (SYNTHROID, LEVOTHROID) 88 MCG tablet Take 88 mcg by mouth daily.      . metFORMIN (GLUCOPHAGE) 850 MG tablet Take 1 tablet (850 mg total) by mouth 2 (two) times daily with a meal. HOLD for 2 days, restart on 10/07/2011.      . nitroGLYCERIN (NITROSTAT) 0.4 MG SL tablet Place 0.4 mg under the tongue every 5 (five) minutes as needed. For chest pain      . omeprazole (PRILOSEC) 40 MG capsule Take 40 mg by mouth daily as needed. For acid reflux      . pravastatin (PRAVACHOL) 40 MG tablet Take 80 mg by mouth daily.       [name] warfarin (COUMADIN) 3 MG tablet TAKE AS DIRECTED  60 tablet  0    Allergies  Allergen Reactions  . Ace Inhibitors     REACTION: cough  . Codeine Phosphate     REACTION: nausea  . Hydrochlorothiazide W-Triamterene     REACTION: cramps    Past Medical History  Diagnosis Date  . Diabetes mellitus, type 2   . CAD (coronary artery disease)   . Hypertension   . Hyperlipidemia   . Atrial  fibrillation   . AAA (abdominal aortic aneurysm)     next u/s due 03-2012  . Hypothyroidism   . BPH (benign prostatic hyperplasia)     reports aprocedure (TURP) remotely in [place].Marland KitchenNo futher f/u w/ urology  . GERD (gastroesophageal reflux disease)   . Insomnia     transient  . Vitamin B12 deficiency   . Osteopenia     dexa 2-11    Past Surgical History  Procedure Date  . Pacemaker 1980, 1995    medtronic minix [id number]  . Cholecystectomy, laparoscopic   . Prostatectomy     transurethral  . Inguinal herniorrhaphies     bilateral  . Right hip replacement   . Coronary artery bypass graft     1999 stents in 2000  . Penile prosthesis implant 1992  . Transthoracic echocardiogram 12/2006  . Cardiac catheterization 01/31/06, 09/25/10    Family History  Problem Relation Age of Onset  . Alzheimer's disease Mother   . Mental illness Mother     alzheimers  . Heart failure Father   . Stroke Father     cva  . Heart disease Father     chf  .  Heart disease Brother     maybe  . Diabetes Other     several siblings  . Coronary artery disease Brother     cabg  . Alzheimer's disease Sister   . Mental illness Sister     alzheimers  . Cancer Neg Hx     History   Social History  . Marital Status: Legally Separated    Spouse Name: N/A    Number of Children: N/A  . Years of Education: N/A   Occupational History  . retired    Social History Main Topics  . Smoking status: Never Smoker   . Smokeless tobacco: Not on file  . Alcohol Use: Yes     Comment: rare  . Drug Use: No  . Sexually Active: Not on file   Other Topics Concern  . Not on file   Social History Narrative   Lives by self, independent on ADL, retired. Separated from wife. Daughter, [name], lives in same neighborhood. Does not regularly exercise. Daily Caffeine Use-2 cups daily. Diet-improved lately.      ROS: Please see the HPI.  All other systems reviewed and negative.  PHYSICAL EXAM:  BP 146/80  Pulse 65  Ht  5\' 7"  (1.702 m)  Wt 176 lb (79.833 kg)  BMI 27.57 kg/m2  SpO2 99%  General: Well developed, well nourished, in no acute distress. Head:  Normocephalic and atraumatic. Neck: no JVD Lungs: Clear to auscultation and percussion. Heart: Normal S1 and S2.  No murmur, rubs or gallops.  Abdomen:  Normal bowel sounds; soft; non tender; no organomegaly Pulses: Pulses normal in all 4 extremities. Extremities: No clubbing or cyanosis. No edema. Neurologic: Alert and oriented x 3.  EKG:  V pacing backup.  Atrial fib with controlled ventricular response.  Non specific ST and T changes.  IVCD.    ASSESSMENT AND PLAN:

## 2012-06-03 NOTE — Assessment & Plan Note (Signed)
Not sure of source of anemia.  Will recheck his CBC.

## 2012-06-03 NOTE — Assessment & Plan Note (Signed)
Recheck lipid and liver.   

## 2012-06-03 NOTE — Assessment & Plan Note (Signed)
Rate is currently controlled.

## 2012-06-04 LAB — LIPID PANEL
Cholesterol: 162 mg/dL (ref 0–200)
HDL: 24.4 mg/dL — ABNORMAL LOW (ref >39.00)
Triglycerides: 391 mg/dL — ABNORMAL HIGH (ref 0.0–149.0)

## 2012-06-04 LAB — HEPATIC FUNCTION PANEL
ALT: 16 U/L (ref 0–53)
Albumin: 4.1 g/dL (ref 3.5–5.2)
Total Protein: 7.2 g/dL (ref 6.0–8.3)

## 2012-06-04 LAB — LDL CHOLESTEROL, DIRECT: Direct LDL: 84.7 mg/dL

## 2012-06-04 LAB — DIGOXIN LEVEL: Digoxin Level: 0.6 ng/mL — ABNORMAL LOW (ref 0.8–2.0)

## 2012-06-19 DIAGNOSIS — H4011X0 Primary open-angle glaucoma, stage unspecified: Secondary | ICD-10-CM | POA: Diagnosis not present

## 2012-06-19 DIAGNOSIS — E119 Type 2 diabetes mellitus without complications: Secondary | ICD-10-CM | POA: Diagnosis not present

## 2012-06-19 DIAGNOSIS — H2510 Age-related nuclear cataract, unspecified eye: Secondary | ICD-10-CM | POA: Diagnosis not present

## 2012-06-19 DIAGNOSIS — H409 Unspecified glaucoma: Secondary | ICD-10-CM | POA: Diagnosis not present

## 2012-06-25 ENCOUNTER — Other Ambulatory Visit: Payer: Self-pay | Admitting: Internal Medicine

## 2012-06-26 NOTE — Telephone Encounter (Signed)
Call pt, I have RF his meds x 3 weeks, I haven't see him in several months. Arrange a OV within 3 weeks, no further RF w/o OV I did not RF coumadin, no recent INRs

## 2012-06-30 NOTE — Progress Notes (Signed)
Lauren Can you have him get a lipid and liver profile. He also needs a CBC, iron and B12 level. Thanks TS  I spoke with the pt and he did have a lipid and liver profile checked. I made the pt aware that Dr [name] would like the pt to have a CBC, iron and B12 level checked.  The pt said he is scheduled to see Dr Drue Novel on 07/14/12 and he would like to have labs drawn at that time. I have placed a note in the pt's appointment that he needs to have labs drawn to follow-up on anemia.

## 2012-06-30 NOTE — Telephone Encounter (Signed)
Discussed with pt. Scheduled CPE 3.4.14.

## 2012-07-02 DIAGNOSIS — H409 Unspecified glaucoma: Secondary | ICD-10-CM | POA: Diagnosis not present

## 2012-07-02 DIAGNOSIS — H2510 Age-related nuclear cataract, unspecified eye: Secondary | ICD-10-CM | POA: Diagnosis not present

## 2012-07-02 DIAGNOSIS — H4011X0 Primary open-angle glaucoma, stage unspecified: Secondary | ICD-10-CM | POA: Diagnosis not present

## 2012-07-13 ENCOUNTER — Other Ambulatory Visit: Payer: Self-pay | Admitting: Internal Medicine

## 2012-07-14 ENCOUNTER — Ambulatory Visit (INDEPENDENT_AMBULATORY_CARE_PROVIDER_SITE_OTHER): Payer: Medicare Other | Admitting: Internal Medicine

## 2012-07-14 ENCOUNTER — Encounter: Payer: Self-pay | Admitting: Internal Medicine

## 2012-07-14 VITALS — BP 128/74 | HR 83 | Temp 97.9°F | Ht 67.0 in | Wt 177.0 lb

## 2012-07-14 DIAGNOSIS — D509 Iron deficiency anemia, unspecified: Secondary | ICD-10-CM | POA: Diagnosis not present

## 2012-07-14 DIAGNOSIS — E119 Type 2 diabetes mellitus without complications: Secondary | ICD-10-CM

## 2012-07-14 DIAGNOSIS — I2510 Atherosclerotic heart disease of native coronary artery without angina pectoris: Secondary | ICD-10-CM | POA: Diagnosis not present

## 2012-07-14 DIAGNOSIS — E538 Deficiency of other specified B group vitamins: Secondary | ICD-10-CM

## 2012-07-14 DIAGNOSIS — E039 Hypothyroidism, unspecified: Secondary | ICD-10-CM | POA: Diagnosis not present

## 2012-07-14 DIAGNOSIS — I4891 Unspecified atrial fibrillation: Secondary | ICD-10-CM | POA: Diagnosis not present

## 2012-07-14 DIAGNOSIS — E559 Vitamin D deficiency, unspecified: Secondary | ICD-10-CM

## 2012-07-14 DIAGNOSIS — Z7901 Long term (current) use of anticoagulants: Secondary | ICD-10-CM

## 2012-07-14 DIAGNOSIS — Z0000 Encounter for general adult medical examination without abnormal findings: Secondary | ICD-10-CM | POA: Diagnosis not present

## 2012-07-14 DIAGNOSIS — I10 Essential (primary) hypertension: Secondary | ICD-10-CM

## 2012-07-14 DIAGNOSIS — M899 Disorder of bone, unspecified: Secondary | ICD-10-CM

## 2012-07-14 DIAGNOSIS — M949 Disorder of cartilage, unspecified: Secondary | ICD-10-CM

## 2012-07-14 LAB — POCT INR: INR: 1.3

## 2012-07-14 MED ORDER — ZOSTER VACCINE LIVE 19400 UNT/0.65ML ~~LOC~~ SOLR: 0.6500 mL | Freq: Once | SUBCUTANEOUS | Status: DC

## 2012-07-14 NOTE — Assessment & Plan Note (Addendum)
History of iron deficiency, had EGD and a colonoscopy in 2010. Last CBC 2013. Plan: Labs

## 2012-07-14 NOTE — Assessment & Plan Note (Signed)
Last visit more than a year ago, will check the A1c and a microalbumin. Encourage patient to come back regularly. Next visit in 3 months

## 2012-07-14 NOTE — Assessment & Plan Note (Addendum)
Currently on Coumadin 3 mg daily (21 mg weekly), has not checked an INR in months. INR today 1.3. Plan: Coumadin 3 mg one tablet daily, 1.5 tablet Monday Wednesday and Friday (25.5 mg weekly) Next INR 2 weeks.  Discussed the patient the importance of regular checkups

## 2012-07-14 NOTE — Assessment & Plan Note (Signed)
Seems to be well-controlled, check labs

## 2012-07-14 NOTE — Assessment & Plan Note (Signed)
History of low   B12, check labs

## 2012-07-14 NOTE — Patient Instructions (Addendum)
New dose: Coumadin 3 mg one tablet daily, but 1.5  tablet Monday Wednesday and Friday  Next Coumadin check 2 weeks ---- Schedule a routine visit with me in 3 months  Fall Prevention and Home Safety Falls cause injuries and can affect all age groups. It is possible to use preventive measures to significantly decrease the likelihood of falls. There are many simple measures which can make your home safer and prevent falls. OUTDOORS  Repair cracks and edges of walkways and driveways.  Remove high doorway thresholds.  Trim shrubbery on the main path into your home.  Have good outside lighting.  Clear walkways of tools, rocks, debris, and clutter.  Check that handrails are not broken and are securely fastened. Both sides of steps should have handrails.  Have leaves, snow, and ice cleared regularly.  Use sand or salt on walkways during winter months.  In the garage, clean up grease or oil spills. BATHROOM  Install night lights.  Install grab bars by the toilet and in the tub and shower.  Use non-skid mats or decals in the tub or shower.  Place a plastic non-slip stool in the shower to sit on, if needed.  Keep floors dry and clean up all water on the floor immediately.  Remove soap buildup in the tub or shower on a regular basis.  Secure bath mats with non-slip, double-sided rug tape.  Remove throw rugs and tripping hazards from the floors. BEDROOMS  Install night lights.  Make sure a bedside light is easy to reach.  Do not use oversized bedding.  Keep a telephone by your bedside.  Have a firm chair with side arms to use for getting dressed.  Remove throw rugs and tripping hazards from the floor. KITCHEN  Keep handles on pots and pans turned toward the center of the stove. Use back burners when possible.  Clean up spills quickly and allow time for drying.  Avoid walking on wet floors.  Avoid hot utensils and knives.  Position shelves so they are not too high  or low.  Place commonly used objects within easy reach.  If necessary, use a sturdy step stool with a grab bar when reaching.  Keep electrical cables out of the way.  Do not use floor polish or wax that makes floors slippery. If you must use wax, use non-skid floor wax.  Remove throw rugs and tripping hazards from the floor. STAIRWAYS  Never leave objects on stairs.  Place handrails on both sides of stairways and use them. Fix any loose handrails. Make sure handrails on both sides of the stairways are as long as the stairs.  Check carpeting to make sure it is firmly attached along stairs. Make repairs to worn or loose carpet promptly.  Avoid placing throw rugs at the top or bottom of stairways, or properly secure the rug with carpet tape to prevent slippage. Get rid of throw rugs, if possible.  Have an electrician put in a light switch at the top and bottom of the stairs. OTHER FALL PREVENTION TIPS  Wear low-heel or rubber-soled shoes that are supportive and fit well. Wear closed toe shoes.  When using a stepladder, make sure it is fully opened and both spreaders are firmly locked. Do not climb a closed stepladder.  Add color or contrast paint or tape to grab bars and handrails in your home. Place contrasting color strips on first and last steps.  Learn and use mobility aids as needed. Install an electrical emergency response system.  Turn on lights to avoid dark areas. Replace light bulbs that burn out immediately. Get light switches that glow.  Arrange furniture to create clear pathways. Keep furniture in the same place.  Firmly attach carpet with non-skid or double-sided tape.  Eliminate uneven floor surfaces.  Select a carpet pattern that does not visually hide the edge of steps.  Be aware of all pets. OTHER HOME SAFETY TIPS  Set the water temperature for 120 F (48.8 C).  Keep emergency numbers on or near the telephone.  Keep smoke detectors on every level of  the home and near sleeping areas. Document Released: 04/19/2002 Document Revised: 10/29/2011 Document Reviewed: 07/19/2011 [facility] Patient Information 2013 [place], [place].

## 2012-07-14 NOTE — Telephone Encounter (Signed)
Refills done at OV.

## 2012-07-14 NOTE — Assessment & Plan Note (Signed)
Last bone density test 06-2009 Recheck a bone density test. History of low vitamin D, check labs

## 2012-07-14 NOTE — Assessment & Plan Note (Signed)
Td 2009 pneumonia shot 11-08 had a flu shot  shingles shot: rx provided  Cscope 2004, diverticuli; repeated colonoscopy 8- 2010   showed diverticuli  79 year old gentleman with multiple medical problems, PSAs stable over time. Revisit need for screening next year although I don't think it will be necessary encourage more exercise, information regarding healthy diet provided

## 2012-07-14 NOTE — Assessment & Plan Note (Signed)
Per cardiology, seems to be doing well

## 2012-07-14 NOTE — Assessment & Plan Note (Signed)
Reports good compliance with medication, due for a TSH

## 2012-07-14 NOTE — Progress Notes (Signed)
Subjective:    Patient ID: [name], male    DOB: October 21, 1925, 79 y.o.   MRN: [id number]  HPI Last OV 02-2011. Here for Medicare AWV:  1. Risk factors based on Past M, S, F history: reviewed 2. Physical Activities:  not very active , not limited by pain or fatigue 3. Depression/mood: (-) screening  4. Hearing: slightly  decreased, stable, no tinnitus, no problems noted w/ normal conversation  5. ADL's: totally independent  6. Fall Risk: low risk, no recent falls  7. Home Safety: does feel safe at home  8. Height, weight, &visual acuity: see VS,see eye doctor regularly,  To have cataract surgery tomorrow 9. Counseling: yes, see below 10. Labs ordered based on risk factors: yes 11. Referral Coordination, if needed 12. Care Plan: See assessment and plan 13.Cognitive Assessment: motor skills, memory and cognition seem appropiate   in addition, we discussed the following CV- Admitted to the hospital with chest pain 09/2011, had a cardiac catheterization, was prescribed medical management , also, got a new pacemaker. Atrial fibrillation, reports taking Coumadin 3 mg daily, has not check his INR in months, reason?. High cholesterol, good medication compliance, had blood work few months ago. Diabetes, taking metformin regularly, no ambulatory blood sugars. Hypertension, good medication compliance, not ambulatory BPs.  Past Medical History  Diagnosis Date  . Diabetes mellitus, type 2   . CAD (coronary artery disease)   . Hypertension   . Hyperlipidemia   . Atrial fibrillation   . AAA (abdominal aortic aneurysm)     next u/s due 03-2012  . Hypothyroidism   . BPH (benign prostatic hyperplasia)     reports aprocedure (TURP) remotely in [place].Marland KitchenNo futher f/u w/ urology  . GERD (gastroesophageal reflux disease)   . Insomnia     transient  . Vitamin B12 deficiency   . Osteopenia     dexa 2-11   Past Surgical History  Procedure Laterality Date  . Pacemaker  1980, 1995, 2013   medtronic minix [id number]  . Cholecystectomy, laparoscopic    . Prostatectomy      transurethral  . Inguinal herniorrhaphies      bilateral  . Right hip replacement    . Coronary artery bypass graft      1999 stents in 2000  . Penile prosthesis implant  1992  . Transthoracic echocardiogram  12/2006  . Cardiac catheterization  01/31/06, 09/25/10, 09-2011   History   Social History  . Marital Status: Legally Separated    Spouse Name: N/A    Number of Children: 5  . Years of Education: N/A   Occupational History  . retired    Social History Main Topics  . Smoking status: Never Smoker   . Smokeless tobacco: Never Used  . Alcohol Use: Yes     Comment: rare  . Drug Use: No  . Sexually Active: Not on file   Other Topics Concern  . Not on file   Social History Narrative   Lives by self, independent on ADL, retired. Separated from wife.    Daughter, [name], lives in same neighborhood.    Diet: eats regular    Family History  Problem Relation Age of Onset  . Alzheimer's disease Mother   . Mental illness Mother     alzheimers  . Heart failure Father   . Stroke Father     cva  . Heart disease Father     chf  . Heart disease Brother     maybe  .  Diabetes Other     several siblings  . Coronary artery disease Brother     cabg  . Alzheimer's disease Sister   . Mental illness Sister     alzheimers  . Cancer Neg Hx       Review of Systems Denies chest pain or shortness or breath, has nitroglycerin, has not used it lately. No nausea, vomiting, diarrhea blood in the stools. No difficulty urinating or gross hematuria.     Objective:   Physical Exam BP 128/74  Pulse 83  Temp(Src) 97.9 F (36.6 C) (Oral)  Ht 5\' 7"  (1.702 m)  Wt 177 lb (80.287 kg)  BMI 27.72 kg/m2  SpO2 94%  General -- alert, well-developed Neck --no thyromegaly  Lungs -- normal respiratory effort, no intercostal retractions, no accessory muscle use, and normal breath sounds.   Heart-- irregular, no  murmur.   Abdomen--soft, non-tender, no distention, no masses, no HSM, no guarding, and no rigidity.  No bruit Extremities-- no pretibial edema bilaterally  Neurologic-- alert & oriented X3 and strength normal in all extremities. Psych-- Cognition and judgment appear intact. Alert and cooperative with normal attention span and concentration.  not anxious appearing and not depressed appearing.       Assessment & Plan:

## 2012-07-15 DIAGNOSIS — H4011X0 Primary open-angle glaucoma, stage unspecified: Secondary | ICD-10-CM | POA: Diagnosis not present

## 2012-07-15 DIAGNOSIS — H2589 Other age-related cataract: Secondary | ICD-10-CM | POA: Diagnosis not present

## 2012-07-15 DIAGNOSIS — H25049 Posterior subcapsular polar age-related cataract, unspecified eye: Secondary | ICD-10-CM | POA: Diagnosis not present

## 2012-07-15 DIAGNOSIS — E119 Type 2 diabetes mellitus without complications: Secondary | ICD-10-CM | POA: Diagnosis not present

## 2012-07-15 DIAGNOSIS — H409 Unspecified glaucoma: Secondary | ICD-10-CM | POA: Diagnosis not present

## 2012-07-15 DIAGNOSIS — H2510 Age-related nuclear cataract, unspecified eye: Secondary | ICD-10-CM | POA: Diagnosis not present

## 2012-07-15 LAB — CBC WITH DIFFERENTIAL/PLATELET
Basophils Relative: 0.8 % (ref 0.0–3.0)
Eosinophils Relative: 1.7 % (ref 0.0–5.0)
Lymphocytes Relative: 15.3 % (ref 12.0–46.0)
Monocytes Absolute: 0.5 10*3/uL (ref 0.1–1.0)
Monocytes Relative: 9.2 % (ref 3.0–12.0)
Neutrophils Relative %: 73 % (ref 43.0–77.0)
Platelets: 235 10*3/uL (ref 150.0–400.0)
RBC: 4.32 Mil/uL (ref 4.22–5.81)
WBC: 5.9 10*3/uL (ref 4.5–10.5)

## 2012-07-15 LAB — IRON: Iron: 35 ug/dL — ABNORMAL LOW (ref 42–165)

## 2012-07-15 LAB — BASIC METABOLIC PANEL
BUN: 20 mg/dL (ref 6–23)
Calcium: 8.4 mg/dL (ref 8.4–10.5)
Creatinine, Ser: 1.3 mg/dL (ref 0.4–1.5)
GFR: 54.87 mL/min — ABNORMAL LOW (ref >60.00)

## 2012-07-15 LAB — VITAMIN B12: Vitamin B-12: 176 pg/mL — ABNORMAL LOW (ref 211–911)

## 2012-07-15 LAB — TSH: TSH: 4.66 u[IU]/mL (ref 0.35–5.50)

## 2012-07-15 LAB — FERRITIN: Ferritin: 15.3 ng/mL — ABNORMAL LOW (ref 22.0–322.0)

## 2012-07-15 NOTE — Progress Notes (Signed)
Pt could not use the bathroom.  Sheena T

## 2012-07-16 ENCOUNTER — Ambulatory Visit: Payer: Medicare Other

## 2012-07-16 ENCOUNTER — Telehealth: Payer: Self-pay | Admitting: Internal Medicine

## 2012-07-16 DIAGNOSIS — D649 Anemia, unspecified: Secondary | ICD-10-CM

## 2012-07-16 NOTE — Addendum Note (Signed)
Addended by: [name] E on: 07/16/2012 05:42 PM   Modules accepted: Orders

## 2012-07-16 NOTE — Telephone Encounter (Signed)
Advise patient: He continued to have iron deficiency anemia. Diabetes is not well-controlled. B12 is low Plan: Arrange a GI referral for reassessment of anemia, further workup? Add Januvia 100 mg one by mouth daily #30, 3 RF, ok samples if available Start B12 shots, once a week x 4, then once a month. Followup in 2 months from today please Still needs his INR as planned in 2 weeks

## 2012-07-16 NOTE — Telephone Encounter (Signed)
Januvia samples are at the front desk for the patient to come by and pick up.

## 2012-07-16 NOTE — Telephone Encounter (Signed)
Discussed with pt.  Patient has an appointment for first B-12 injection on 3.13.14 @ 3:30pm. Patient has a 2 month follow-up appointment with Dr. Drue Novel on 5.6.14 @3 :15pm.

## 2012-07-18 LAB — VITAMIN D 1,25 DIHYDROXY: Vitamin D 1, 25 (OH)2 Total: 60 pg/mL (ref 18–72)

## 2012-07-28 ENCOUNTER — Other Ambulatory Visit: Payer: Self-pay | Admitting: Internal Medicine

## 2012-07-28 ENCOUNTER — Telehealth: Payer: Self-pay | Admitting: Internal Medicine

## 2012-07-28 NOTE — Telephone Encounter (Signed)
He is due for an INR check, please schedule.

## 2012-07-28 NOTE — Telephone Encounter (Signed)
Pt has an appt 3.27.14.

## 2012-07-28 NOTE — Telephone Encounter (Signed)
Refill done.  

## 2012-07-29 NOTE — Telephone Encounter (Signed)
Spoke with pt & he states he would prefer to wait until the 27th.

## 2012-07-29 NOTE — Telephone Encounter (Signed)
Needs earlier appointment , schedule one this week please

## 2012-07-30 ENCOUNTER — Ambulatory Visit: Payer: Medicare Other

## 2012-08-06 ENCOUNTER — Ambulatory Visit (INDEPENDENT_AMBULATORY_CARE_PROVIDER_SITE_OTHER): Payer: Medicare Other | Admitting: *Deleted

## 2012-08-06 DIAGNOSIS — Z7901 Long term (current) use of anticoagulants: Secondary | ICD-10-CM | POA: Diagnosis not present

## 2012-08-06 NOTE — Patient Instructions (Signed)
Current dose Coumadin 3 mg one tablet daily, 1.5 tablet Monday Wednesday and Friday (25.5 mg weekly) INR 3.6 New dose  Coumadin 3 mg one tablet daily, 1.5 tablet Monday  and Thursday (24 mg weekly) Next 2 weeks  JP

## 2012-08-06 NOTE — Patient Instructions (Addendum)
Correction We decrease dose of coumadin from: Coumadin 3 mg one tablet daily, 1.5 tablet Monday Wednesday and Friday (25.5 mg weekly) ZO:[id number] 3 mg one tablet daily, 1.5 tablet  Wednesday and Friday (24 mg weekly) Will ask pt to recheck in 2 weeks (instead of 4) [name]

## 2012-08-10 ENCOUNTER — Other Ambulatory Visit: Payer: Self-pay | Admitting: Internal Medicine

## 2012-08-10 NOTE — Telephone Encounter (Signed)
Refill done.  

## 2012-08-13 ENCOUNTER — Telehealth: Payer: Self-pay | Admitting: *Deleted

## 2012-08-13 NOTE — Telephone Encounter (Signed)
Wanda Plump, MD - i corrected the RTC on this pt to 2 weeks, please call him, let him know and document you did.  Called pt & explained that Dr. Drue Novel would like for him to have his PT/INR checked in 2 weeks instead of 4. Pt declined & stated "he already had an appt schedule for 4 weeks & he did not want to come in earlier."

## 2012-08-13 NOTE — Telephone Encounter (Addendum)
Needs to be seen at the coumadin clinic, please talk to them , arrange a transfer of care . He may be a great candidate for a newer anticoagulants.

## 2012-08-14 ENCOUNTER — Ambulatory Visit (INDEPENDENT_AMBULATORY_CARE_PROVIDER_SITE_OTHER): Payer: Medicare Other | Admitting: Internal Medicine

## 2012-08-14 ENCOUNTER — Other Ambulatory Visit (INDEPENDENT_AMBULATORY_CARE_PROVIDER_SITE_OTHER): Payer: Medicare Other

## 2012-08-14 ENCOUNTER — Encounter: Payer: Self-pay | Admitting: Internal Medicine

## 2012-08-14 VITALS — BP 170/88 | HR 72 | Ht 67.0 in | Wt 179.0 lb

## 2012-08-14 DIAGNOSIS — D509 Iron deficiency anemia, unspecified: Secondary | ICD-10-CM

## 2012-08-14 DIAGNOSIS — D519 Vitamin B12 deficiency anemia, unspecified: Secondary | ICD-10-CM

## 2012-08-14 DIAGNOSIS — D518 Other vitamin B12 deficiency anemias: Secondary | ICD-10-CM

## 2012-08-14 LAB — IGA: IgA: 225 mg/dL (ref 68–378)

## 2012-08-14 MED ORDER — B-12 1000 MCG PO CAPS: 1.0000 | ORAL_CAPSULE | Freq: Every day | ORAL | Status: DC

## 2012-08-14 MED ORDER — FERROUS SULFATE 325 (65 FE) MG PO TBEC: 325.0000 mg | DELAYED_RELEASE_TABLET | Freq: Two times a day (BID) | ORAL | Status: DC

## 2012-08-14 MED ORDER — CYANOCOBALAMIN 1000 MCG/ML IJ SOLN
1000.0000 ug | Freq: Once | INTRAMUSCULAR | Status: AC
  Administered 2012-08-14: 1000 ug via INTRAMUSCULAR

## 2012-08-14 NOTE — Patient Instructions (Addendum)
Your physician has requested that you go to the basement for lab work before leaving today.  We will be in touch with plans after we get the results.  Today we are giving you a Vitamin B12 injection.  Please take Vitamin B12 orally every day, 1000mg . This has been sent to your pharmacy.  Don't stop any of your medicines unless told to do so.  Today you have been given hemoccult cards to take home and do, once done either mail them back to [place] or drop them off at the lab.  We have sent medications to your pharmacy for you to pick up at your convenience. (Vitamin B12 and Iron)  Thank you for choosing me and [facility].  Iva Boop, M.D., [facility]

## 2012-08-14 NOTE — Progress Notes (Signed)
  Subjective:    Patient ID: [name], male    DOB: July 03, 1925, 79 y.o.   MRN: [id number]  HPI The patient is here because of a recurrent anemia. He had a work-up about 2 years ago with unrevealing endoscopic evaluations. Since that time he  Stopped iron and B12 No bleeding or GI sxs but he is color blind  Medications, allergies, past medical history, past surgical history, family history and social history are reviewed and updated in the EMR.  Review of Systems As above    Objective:   Physical Exam General:  NAD Eyes:   anicteric Lungs:  clear Heart:  S1S2 no rubs, murmurs or gallops Abdomen:  soft and nontender, BS+ Ext:   no edema    Data Reviewed:  Lab Results  Component Value Date   WBC 5.9 07/14/2012   HGB 11.1* 07/14/2012   HCT 34.0* 07/14/2012   MCV 78.7 07/14/2012   PLT 235.0 07/14/2012   Lab Results  Component Value Date   FERRITIN 15.3* 07/14/2012   Lab Results  Component Value Date   VITAMINB12 176* 07/14/2012       Assessment & Plan:  Anemia, iron deficiency-recurrent  B12 deficiency anemia-recurrent   1. Hemoccults (relative indication - he preferred) 2. B12 oral and 1 injection....need to see if we can find other injections 3. Fe SO4 bid 4. TTG Ab IgA to look for celiac disease 5. Would not repeat EGD or colonoscopy  I appreciate the opportunity to care for this patient.  YQ:MVHQ Drue Novel, MD

## 2012-08-14 NOTE — Telephone Encounter (Signed)
Contacted Bailey Mech with [facility].

## 2012-08-14 NOTE — Telephone Encounter (Signed)
Ok spoke to [place] & she stated that would be find to switch him to the coumadin clinic @ [facility] but first if you are wanting to switch the pt to a different drug then you would have to decide on which drug & then the pt would need to contact his insurance company to see if they will cover this med first. Once this has been done then the pt can be scheduled with [facility] @ [facility]. Please advise.

## 2012-08-14 NOTE — Telephone Encounter (Signed)
I was thinking about the coumadin clinic @ cardiology (he is already a pt of cardiology)

## 2012-08-17 LAB — TISSUE TRANSGLUTAMINASE, IGA: Tissue Transglutaminase Ab, IgA: 5.3 U/mL (ref <20)

## 2012-08-17 NOTE — Progress Notes (Signed)
Quick Note:  tests negative for celiac disease I have asked Dr. Drue Novel if he has any injectable B12 ______

## 2012-08-19 MED ORDER — METFORMIN HCL 850 MG PO TABS: 850.0000 mg | ORAL_TABLET | Freq: Two times a day (BID) | ORAL | Status: DC

## 2012-08-19 MED ORDER — LEVOTHYROXINE SODIUM 88 MCG PO TABS: 88.0000 ug | ORAL_TABLET | Freq: Every day | ORAL | Status: DC

## 2012-08-19 MED ORDER — FENOFIBRATE MICRONIZED 134 MG PO CAPS: ORAL_CAPSULE | ORAL | Status: DC

## 2012-08-19 MED ORDER — ATENOLOL 50 MG PO TABS: 50.0000 mg | ORAL_TABLET | Freq: Two times a day (BID) | ORAL | Status: DC

## 2012-08-19 MED ORDER — SITAGLIPTIN PHOSPHATE 100 MG PO TABS: 100.0000 mg | ORAL_TABLET | Freq: Every day | ORAL | Status: DC

## 2012-08-19 MED ORDER — WARFARIN SODIUM 3 MG PO TABS: ORAL_TABLET | ORAL | Status: DC

## 2012-08-19 NOTE — Telephone Encounter (Signed)
Pt requesting refills on meds for 90-day supply.  Refill done.

## 2012-08-26 DIAGNOSIS — H2589 Other age-related cataract: Secondary | ICD-10-CM | POA: Diagnosis not present

## 2012-08-26 DIAGNOSIS — E119 Type 2 diabetes mellitus without complications: Secondary | ICD-10-CM | POA: Diagnosis not present

## 2012-08-26 DIAGNOSIS — H2510 Age-related nuclear cataract, unspecified eye: Secondary | ICD-10-CM | POA: Diagnosis not present

## 2012-08-26 DIAGNOSIS — H409 Unspecified glaucoma: Secondary | ICD-10-CM | POA: Diagnosis not present

## 2012-08-26 DIAGNOSIS — H25049 Posterior subcapsular polar age-related cataract, unspecified eye: Secondary | ICD-10-CM | POA: Diagnosis not present

## 2012-08-29 ENCOUNTER — Emergency Department (HOSPITAL_COMMUNITY): Payer: Medicare Other

## 2012-08-29 ENCOUNTER — Encounter (HOSPITAL_COMMUNITY): Payer: Self-pay | Admitting: Emergency Medicine

## 2012-08-29 ENCOUNTER — Inpatient Hospital Stay (HOSPITAL_COMMUNITY)
Admission: EM | Admit: 2012-08-29 | Discharge: 2012-08-31 | DRG: 682 | Disposition: A | Discharge status: Home / Self Care | Payer: Medicare Other | Attending: Internal Medicine | Admitting: Internal Medicine

## 2012-08-29 DIAGNOSIS — Z96649 Presence of unspecified artificial hip joint: Secondary | ICD-10-CM | POA: Diagnosis not present

## 2012-08-29 DIAGNOSIS — E119 Type 2 diabetes mellitus without complications: Secondary | ICD-10-CM | POA: Diagnosis not present

## 2012-08-29 DIAGNOSIS — I2510 Atherosclerotic heart disease of native coronary artery without angina pectoris: Secondary | ICD-10-CM | POA: Diagnosis not present

## 2012-08-29 DIAGNOSIS — I252 Old myocardial infarction: Secondary | ICD-10-CM

## 2012-08-29 DIAGNOSIS — Z0000 Encounter for general adult medical examination without abnormal findings: Secondary | ICD-10-CM

## 2012-08-29 DIAGNOSIS — N400 Enlarged prostate without lower urinary tract symptoms: Secondary | ICD-10-CM | POA: Diagnosis present

## 2012-08-29 DIAGNOSIS — D500 Iron deficiency anemia secondary to blood loss (chronic): Secondary | ICD-10-CM | POA: Diagnosis present

## 2012-08-29 DIAGNOSIS — Z79899 Other long term (current) drug therapy: Secondary | ICD-10-CM | POA: Diagnosis not present

## 2012-08-29 DIAGNOSIS — R109 Unspecified abdominal pain: Secondary | ICD-10-CM | POA: Diagnosis not present

## 2012-08-29 DIAGNOSIS — R0789 Other chest pain: Secondary | ICD-10-CM | POA: Diagnosis not present

## 2012-08-29 DIAGNOSIS — K2970 Gastritis, unspecified, without bleeding: Secondary | ICD-10-CM | POA: Diagnosis present

## 2012-08-29 DIAGNOSIS — Z823 Family history of stroke: Secondary | ICD-10-CM | POA: Diagnosis not present

## 2012-08-29 DIAGNOSIS — Z951 Presence of aortocoronary bypass graft: Secondary | ICD-10-CM

## 2012-08-29 DIAGNOSIS — Z833 Family history of diabetes mellitus: Secondary | ICD-10-CM | POA: Diagnosis not present

## 2012-08-29 DIAGNOSIS — Z7982 Long term (current) use of aspirin: Secondary | ICD-10-CM

## 2012-08-29 DIAGNOSIS — Z9079 Acquired absence of other genital organ(s): Secondary | ICD-10-CM

## 2012-08-29 DIAGNOSIS — I4821 Permanent atrial fibrillation: Secondary | ICD-10-CM | POA: Diagnosis present

## 2012-08-29 DIAGNOSIS — K209 Esophagitis, unspecified without bleeding: Secondary | ICD-10-CM | POA: Diagnosis present

## 2012-08-29 DIAGNOSIS — I4891 Unspecified atrial fibrillation: Secondary | ICD-10-CM

## 2012-08-29 DIAGNOSIS — E785 Hyperlipidemia, unspecified: Secondary | ICD-10-CM

## 2012-08-29 DIAGNOSIS — Z9861 Coronary angioplasty status: Secondary | ICD-10-CM

## 2012-08-29 DIAGNOSIS — N179 Acute kidney failure, unspecified: Secondary | ICD-10-CM | POA: Diagnosis not present

## 2012-08-29 DIAGNOSIS — D509 Iron deficiency anemia, unspecified: Secondary | ICD-10-CM | POA: Diagnosis not present

## 2012-08-29 DIAGNOSIS — R079 Chest pain, unspecified: Secondary | ICD-10-CM

## 2012-08-29 DIAGNOSIS — K219 Gastro-esophageal reflux disease without esophagitis: Secondary | ICD-10-CM | POA: Diagnosis present

## 2012-08-29 DIAGNOSIS — Z8249 Family history of ischemic heart disease and other diseases of the circulatory system: Secondary | ICD-10-CM

## 2012-08-29 DIAGNOSIS — M899 Disorder of bone, unspecified: Secondary | ICD-10-CM

## 2012-08-29 DIAGNOSIS — IMO0001 Reserved for inherently not codable concepts without codable children: Secondary | ICD-10-CM | POA: Diagnosis present

## 2012-08-29 DIAGNOSIS — K859 Acute pancreatitis without necrosis or infection, unspecified: Secondary | ICD-10-CM | POA: Diagnosis present

## 2012-08-29 DIAGNOSIS — Z7901 Long term (current) use of anticoagulants: Secondary | ICD-10-CM | POA: Diagnosis not present

## 2012-08-29 DIAGNOSIS — R7309 Other abnormal glucose: Secondary | ICD-10-CM | POA: Diagnosis not present

## 2012-08-29 DIAGNOSIS — N289 Disorder of kidney and ureter, unspecified: Secondary | ICD-10-CM

## 2012-08-29 DIAGNOSIS — Z950 Presence of cardiac pacemaker: Secondary | ICD-10-CM | POA: Diagnosis not present

## 2012-08-29 DIAGNOSIS — I10 Essential (primary) hypertension: Secondary | ICD-10-CM | POA: Diagnosis present

## 2012-08-29 DIAGNOSIS — E118 Type 2 diabetes mellitus with unspecified complications: Secondary | ICD-10-CM | POA: Diagnosis present

## 2012-08-29 DIAGNOSIS — E538 Deficiency of other specified B group vitamins: Secondary | ICD-10-CM

## 2012-08-29 DIAGNOSIS — K7689 Other specified diseases of liver: Secondary | ICD-10-CM | POA: Diagnosis not present

## 2012-08-29 DIAGNOSIS — E1110 Type 2 diabetes mellitus with ketoacidosis without coma: Secondary | ICD-10-CM

## 2012-08-29 DIAGNOSIS — E1310 Other specified diabetes mellitus with ketoacidosis without coma: Secondary | ICD-10-CM | POA: Diagnosis not present

## 2012-08-29 DIAGNOSIS — R5381 Other malaise: Secondary | ICD-10-CM

## 2012-08-29 DIAGNOSIS — E039 Hypothyroidism, unspecified: Secondary | ICD-10-CM | POA: Diagnosis present

## 2012-08-29 DIAGNOSIS — R739 Hyperglycemia, unspecified: Secondary | ICD-10-CM

## 2012-08-29 LAB — CBC
HCT: 32.5 % — ABNORMAL LOW (ref 39.0–52.0)
Hemoglobin: 10.4 g/dL — ABNORMAL LOW (ref 13.0–17.0)
MCH: 25.6 pg — ABNORMAL LOW (ref 26.0–34.0)
MCHC: 32 g/dL (ref 30.0–36.0)
MCV: 80 fL (ref 78.0–100.0)
Platelets: 227 10*3/uL (ref 150–400)
RBC: 4.06 MIL/uL — ABNORMAL LOW (ref 4.22–5.81)
RDW: 17.4 % — ABNORMAL HIGH (ref 11.5–15.5)
WBC: 8 10*3/uL (ref 4.0–10.5)

## 2012-08-29 LAB — BASIC METABOLIC PANEL
CO2: 23 mEq/L (ref 19–32)
Calcium: 12.2 mg/dL — ABNORMAL HIGH (ref 8.4–10.5)
Chloride: 94 mEq/L — ABNORMAL LOW (ref 96–112)
Glucose, Bld: 336 mg/dL — ABNORMAL HIGH (ref 70–99)
Potassium: 4 mEq/L (ref 3.5–5.1)
Sodium: 131 mEq/L — ABNORMAL LOW (ref 135–145)

## 2012-08-29 LAB — URINALYSIS, ROUTINE W REFLEX MICROSCOPIC
Hgb urine dipstick: NEGATIVE
Nitrite: NEGATIVE
Protein, ur: NEGATIVE mg/dL
Specific Gravity, Urine: 1.016 (ref 1.005–1.030)
Urobilinogen, UA: 0.2 mg/dL (ref 0.0–1.0)

## 2012-08-29 LAB — POCT I-STAT TROPONIN I: Troponin i, poc: 0.03 ng/mL (ref 0.00–0.08)

## 2012-08-29 LAB — BASIC METABOLIC PANEL WITH GFR
BUN: 28 mg/dL — ABNORMAL HIGH (ref 6–23)
Creatinine, Ser: 2.79 mg/dL — ABNORMAL HIGH (ref 0.50–1.35)
GFR calc Af Amer: 22 mL/min — ABNORMAL LOW (ref >90)
GFR calc non Af Amer: 19 mL/min — ABNORMAL LOW (ref >90)

## 2012-08-29 LAB — TROPONIN I
Troponin I: <0.3 ng/mL (ref <0.30)
Troponin I: <0.3 ng/mL (ref <0.30)
Troponin I: <0.3 ng/mL (ref <0.30)

## 2012-08-29 LAB — LIPASE, BLOOD: Lipase: 113 U/L — ABNORMAL HIGH (ref 11–59)

## 2012-08-29 LAB — HEPATIC FUNCTION PANEL
AST: 17 U/L (ref 0–37)
Bilirubin, Direct: <0.1 mg/dL (ref 0.0–0.3)
Total Protein: 6.7 g/dL (ref 6.0–8.3)

## 2012-08-29 LAB — CREATININE, URINE, RANDOM: Creatinine, Urine: 135.64 mg/dL

## 2012-08-29 LAB — PROTIME-INR: INR: 1.52 — ABNORMAL HIGH (ref 0.00–1.49)

## 2012-08-29 LAB — SODIUM, URINE, RANDOM: Sodium, Ur: 24 mEq/L

## 2012-08-29 MED ORDER — MOXIFLOXACIN HCL 0.5 % OP SOLN - NO CHARGE
1.0000 [drp] | Freq: Four times a day (QID) | OPHTHALMIC | Status: DC
  Administered 2012-08-29 – 2012-08-31 (×7): 1 [drp] via OPHTHALMIC

## 2012-08-29 MED ORDER — ATENOLOL 50 MG PO TABS
50.0000 mg | ORAL_TABLET | Freq: Two times a day (BID) | ORAL | Status: DC
  Administered 2012-08-29 – 2012-08-31 (×5): 50 mg via ORAL
  Filled 2012-08-29 (×6): qty 1

## 2012-08-29 MED ORDER — LATANOPROST 0.005 % OP SOLN
1.0000 [drp] | Freq: Every day | OPHTHALMIC | Status: DC
  Administered 2012-08-29 – 2012-08-30 (×2): 1 [drp] via OPHTHALMIC

## 2012-08-29 MED ORDER — SODIUM CHLORIDE 0.9 % IJ SOLN
3.0000 mL | Freq: Two times a day (BID) | INTRAMUSCULAR | Status: DC
  Administered 2012-08-29 – 2012-08-30 (×2): 3 mL via INTRAVENOUS

## 2012-08-29 MED ORDER — ONDANSETRON HCL 4 MG/2ML IJ SOLN: 4.0000 mg | Freq: Four times a day (QID) | INTRAMUSCULAR | Status: DC | PRN

## 2012-08-29 MED ORDER — ZOLPIDEM TARTRATE 5 MG PO TABS
5.0000 mg | ORAL_TABLET | Freq: Once | ORAL | Status: AC
  Administered 2012-08-29: 5 mg via ORAL
  Filled 2012-08-29: qty 1

## 2012-08-29 MED ORDER — WARFARIN - PHARMACIST DOSING INPATIENT: Freq: Every day | Status: DC

## 2012-08-29 MED ORDER — HYDROCODONE-ACETAMINOPHEN 5-325 MG PO TABS
1.0000 | ORAL_TABLET | ORAL | Status: DC | PRN
  Administered 2012-08-29: 1 via ORAL
  Administered 2012-08-30: 2 via ORAL
  Filled 2012-08-29: qty 2
  Filled 2012-08-29: qty 1

## 2012-08-29 MED ORDER — MORPHINE SULFATE 2 MG/ML IJ SOLN: 1.0000 mg | INTRAMUSCULAR | Status: DC | PRN

## 2012-08-29 MED ORDER — LEVOTHYROXINE SODIUM 88 MCG PO TABS
88.0000 ug | ORAL_TABLET | Freq: Every day | ORAL | Status: DC
  Administered 2012-08-30 – 2012-08-31 (×2): 88 ug via ORAL
  Filled 2012-08-29 (×3): qty 1

## 2012-08-29 MED ORDER — NITROGLYCERIN 0.4 MG SL SUBL: 0.4000 mg | SUBLINGUAL_TABLET | SUBLINGUAL | Status: DC | PRN

## 2012-08-29 MED ORDER — ASPIRIN 81 MG PO CHEW
162.0000 mg | CHEWABLE_TABLET | Freq: Once | ORAL | Status: AC
  Administered 2012-08-29: 162 mg via ORAL
  Filled 2012-08-29: qty 2

## 2012-08-29 MED ORDER — WARFARIN SODIUM 7.5 MG PO TABS
7.5000 mg | ORAL_TABLET | Freq: Once | ORAL | Status: AC
  Administered 2012-08-29: 7.5 mg via ORAL
  Filled 2012-08-29: qty 1

## 2012-08-29 MED ORDER — LORAZEPAM 2 MG/ML IJ SOLN
0.5000 mg | Freq: Once | INTRAMUSCULAR | Status: AC
  Administered 2012-08-29: 0.5 mg via INTRAVENOUS
  Filled 2012-08-29: qty 1

## 2012-08-29 MED ORDER — INSULIN ASPART 100 UNIT/ML ~~LOC~~ SOLN
8.0000 [IU] | Freq: Once | SUBCUTANEOUS | Status: AC
  Administered 2012-08-29: 8 [IU] via SUBCUTANEOUS
  Filled 2012-08-29: qty 1

## 2012-08-29 MED ORDER — PANTOPRAZOLE SODIUM 40 MG IV SOLR
40.0000 mg | Freq: Two times a day (BID) | INTRAVENOUS | Status: DC
  Administered 2012-08-29 – 2012-08-31 (×5): 40 mg via INTRAVENOUS
  Filled 2012-08-29 (×6): qty 40

## 2012-08-29 MED ORDER — SODIUM CHLORIDE 0.9 % IV SOLN
INTRAVENOUS | Status: DC
  Administered 2012-08-29: 10:00:00 via INTRAVENOUS

## 2012-08-29 MED ORDER — FERROUS SULFATE 325 (65 FE) MG PO TABS
325.0000 mg | ORAL_TABLET | Freq: Two times a day (BID) | ORAL | Status: DC
  Administered 2012-08-29 – 2012-08-31 (×4): 325 mg via ORAL
  Filled 2012-08-29 (×6): qty 1

## 2012-08-29 MED ORDER — DIGOXIN 125 MCG PO TABS
0.1250 mg | ORAL_TABLET | ORAL | Status: DC
  Administered 2012-08-29 – 2012-08-31 (×2): 0.125 mg via ORAL
  Filled 2012-08-29 (×2): qty 1

## 2012-08-29 MED ORDER — DOCUSATE SODIUM 100 MG PO CAPS
100.0000 mg | ORAL_CAPSULE | Freq: Two times a day (BID) | ORAL | Status: DC
  Administered 2012-08-29 – 2012-08-31 (×5): 100 mg via ORAL
  Filled 2012-08-29 (×6): qty 1

## 2012-08-29 MED ORDER — ONDANSETRON HCL 4 MG PO TABS: 4.0000 mg | ORAL_TABLET | Freq: Four times a day (QID) | ORAL | Status: DC | PRN

## 2012-08-29 MED ORDER — PREDNISOLONE ACETATE 1 % OP SUSP
1.0000 [drp] | Freq: Four times a day (QID) | OPHTHALMIC | Status: DC
  Administered 2012-08-29 – 2012-08-31 (×7): 1 [drp] via OPHTHALMIC
  Filled 2012-08-29: qty 1

## 2012-08-29 MED ORDER — ACETAMINOPHEN 650 MG RE SUPP: 650.0000 mg | Freq: Four times a day (QID) | RECTAL | Status: DC | PRN

## 2012-08-29 MED ORDER — ACETAMINOPHEN 325 MG PO TABS: 650.0000 mg | ORAL_TABLET | Freq: Four times a day (QID) | ORAL | Status: DC | PRN

## 2012-08-29 MED ORDER — VITAMIN B-12 1000 MCG PO TABS
1000.0000 ug | ORAL_TABLET | Freq: Every day | ORAL | Status: DC
  Administered 2012-08-29 – 2012-08-31 (×3): 1000 ug via ORAL
  Filled 2012-08-29 (×3): qty 1

## 2012-08-29 MED ORDER — DIAZEPAM 2 MG PO TABS
2.0000 mg | ORAL_TABLET | Freq: Two times a day (BID) | ORAL | Status: DC | PRN
  Administered 2012-08-29: 2 mg via ORAL
  Filled 2012-08-29: qty 1

## 2012-08-29 MED ORDER — SIMVASTATIN 40 MG PO TABS
40.0000 mg | ORAL_TABLET | Freq: Every day | ORAL | Status: DC
  Administered 2012-08-29 – 2012-08-30 (×2): 40 mg via ORAL
  Filled 2012-08-29 (×3): qty 1

## 2012-08-29 MED ORDER — GI COCKTAIL ~~LOC~~
30.0000 mL | Freq: Once | ORAL | Status: AC
  Administered 2012-08-29: 30 mL via ORAL
  Filled 2012-08-29: qty 30

## 2012-08-29 MED ORDER — BROMFENAC SODIUM 0.07 % OP SOLN
1.0000 [drp] | Freq: Every day | OPHTHALMIC | Status: DC
  Administered 2012-08-29 – 2012-08-30 (×2): 1 [drp] via OPHTHALMIC

## 2012-08-29 MED ORDER — INSULIN ASPART 100 UNIT/ML ~~LOC~~ SOLN
0.0000 [IU] | Freq: Three times a day (TID) | SUBCUTANEOUS | Status: DC
  Administered 2012-08-29: 3 [IU] via SUBCUTANEOUS
  Administered 2012-08-29 – 2012-08-31 (×5): 2 [IU] via SUBCUTANEOUS
  Administered 2012-08-31: 3 [IU] via SUBCUTANEOUS

## 2012-08-29 MED ORDER — SODIUM CHLORIDE 0.9 % IV SOLN: INTRAVENOUS | Status: DC

## 2012-08-29 MED ORDER — SODIUM CHLORIDE 0.9 % IV BOLUS (SEPSIS)
500.0000 mL | Freq: Once | INTRAVENOUS | Status: AC
  Administered 2012-08-29: 500 mL via INTRAVENOUS

## 2012-08-29 MED ORDER — DIGOXIN 125 MCG PO TABS: 0.1250 mg | ORAL_TABLET | Freq: Every day | ORAL | Status: DC

## 2012-08-29 NOTE — ED Notes (Signed)
The patient started having central, chest pain (which he described as a burning sensation) at 2000 last night.

## 2012-08-29 NOTE — ED Notes (Signed)
EDP at the bedside.  ?

## 2012-08-29 NOTE — ED Notes (Signed)
Admitting MD at the bedside.  

## 2012-08-29 NOTE — H&P (Addendum)
[facility] History and Physical  [name] ZOX:[id number] DOB: Sep 07, 1925 DOA: 08/29/2012  Referring physician: Dr Oletta Lamas PCP: [name], MD  Specialists: None  Chief Complaint: Chest pain.   HPI: [name] is a 79 y.o. male with PMH significant for CAD, HTN, Diabetes uncontrolled, A fib, history of symptomatic bradycardia S/P pacemaker who presents to ED complaining of chest pain that started 2 days prior to admission. He had Cataract surgery 4 days prior to admission, that day he ate chicken sandwich and vomit twice. He has had decrease appetite. He has  Not been eating well for last 4 days. He relates chest pain gets worse when he eats. Not pleuritic in nature. Chest pain last 5 minutes, not associated with dyspnea. It is sharp in quality. He is chest pain free at this time.  Chest pain is different to pain he had with prior MI. He denies black stool, hematochezia.   Review of Systems:  Negative except as per HPI.   Past Medical History  Diagnosis Date  . Diabetes mellitus, type 2   . CAD (coronary artery disease)   . Hypertension   . Hyperlipidemia   . Atrial fibrillation   . AAA (abdominal aortic aneurysm)     next u/s due 03-2012  . Hypothyroidism   . BPH (benign prostatic hyperplasia)     reports aprocedure (TURP) remotely in [place].Marland KitchenNo futher f/u w/ urology  . GERD (gastroesophageal reflux disease)   . Insomnia     transient  . Vitamin B12 deficiency   . Osteopenia     dexa 2-11  . Diverticulosis     left colon  . Anemia    Past Surgical History  Procedure Laterality Date  . Pacemaker  1980, 1995, 2013    medtronic minix 8341  . Cholecystectomy, laparoscopic    . Prostatectomy      transurethral  . Inguinal herniorrhaphies      bilateral  . Right hip replacement    . Coronary artery bypass graft      1999 stents in 2000  . Penile prosthesis implant  1992  . Transthoracic echocardiogram  12/2006  . Esophagogastroduodenoscopy      multiple  . Colonoscopy       multiple  . Cataract extraction      right  . Cardiac catheterization  01/31/06, 09/25/10, 09-2011   Social History:  reports that he has never smoked. He has never used smokeless tobacco. He reports that  drinks alcohol. He reports that he does not use illicit drugs. he drink a glass of wine every other day. Live at home by himself.    Allergies  Allergen Reactions  . Ace Inhibitors     REACTION: cough  . Codeine Phosphate     REACTION: nausea  . Hydrochlorothiazide W-Triamterene     REACTION: cramps    Family History  Problem Relation Age of Onset  . Alzheimer's disease Mother   . Mental illness Mother     alzheimers  . Heart failure Father   . Stroke Father     cva  . Heart disease Father     chf  . Heart disease Brother     maybe  . Diabetes Other     several siblings  . Coronary artery disease Brother     cabg  . Alzheimer's disease Sister   . Mental illness Sister     alzheimers  . Cancer Neg Hx     Prior to Admission medications  Medication Sig Start Date End Date Taking? Authorizing Provider  amLODipine (NORVASC) 5 MG tablet Take 5 mg by mouth daily.   Yes Historical Provider, MD  aspirin 81 MG tablet Take 81 mg by mouth daily.   Yes Historical Provider, MD  atenolol (TENORMIN) 50 MG tablet Take 1 tablet (50 mg total) by mouth 2 (two) times daily. 08/19/12  Yes Wanda Plump, MD  Cyanocobalamin (B-12) 1000 MCG CAPS Take 1 capsule by mouth daily. 08/14/12  Yes Iva Boop, MD  digoxin (LANOXIN) 0.125 MG tablet Take 0.125 mg by mouth daily.   Yes Historical Provider, MD  fenofibrate micronized (LOFIBRA) 134 MG capsule Take 134 mg by mouth daily before breakfast.   Yes Historical Provider, MD  ferrous sulfate 325 (65 FE) MG EC tablet Take 1 tablet (325 mg total) by mouth 2 (two) times daily with a meal. 08/14/12  Yes Iva Boop, MD  levothyroxine (SYNTHROID, LEVOTHROID) 88 MCG tablet Take 1 tablet (88 mcg total) by mouth daily. 08/19/12  Yes Wanda Plump, MD   metFORMIN (GLUCOPHAGE) 850 MG tablet Take 1 tablet (850 mg total) by mouth 2 (two) times daily with a meal. 08/19/12  Yes Wanda Plump, MD  nitroGLYCERIN (NITROSTAT) 0.4 MG SL tablet Place 0.4 mg under the tongue every 5 (five) minutes as needed. For chest pain   Yes Historical Provider, MD  omeprazole (PRILOSEC) 40 MG capsule Take 40 mg by mouth daily as needed. For acid reflux   Yes Historical Provider, MD  pravastatin (PRAVACHOL) 40 MG tablet Take 80 mg by mouth daily.    Yes Historical Provider, MD  sitaGLIPtin (JANUVIA) 100 MG tablet Take 1 tablet (100 mg total) by mouth daily. 08/19/12  Yes Wanda Plump, MD  warfarin (COUMADIN) 3 MG tablet Take 3-4.5 mg by mouth daily. Take 1.5 tablet on Wed, and Fri, take 1 tablet all other days   Yes Historical Provider, MD   Physical Exam: Filed Vitals:   08/29/12 0631 08/29/12 0734 08/29/12 0815  BP: 129/61 118/60 118/55  Pulse: 60  59  Temp: 97.9 F (36.6 C) 97.9 F (36.6 C)   TempSrc: Oral Oral   Resp: 19 14 18   SpO2: 97% 97% 99%    General Appearance:    Alert, cooperative, no distress, appears stated age  Head:    Normocephalic, without obvious abnormality, atraumatic  Eyes:    PERRL, conjunctiva/corneas clear, EOM's intact,        Ears:    Normal TM's and external ear canals, both ears  Nose:   Nares normal, septum midline, mucosa normal, no drainage    or sinus tenderness  Throat:   Lips, mucosa, and tongue normal; teeth and gums normal  Neck:   Supple, symmetrical, trachea midline, no adenopathy;       thyroid:  No enlargement/tenderness/nodules; no carotid   bruit or JVD  Back:     Symmetric, no curvature, ROM normal, no CVA tenderness  Lungs:     Clear to auscultation bilaterally, respirations unlabored  Chest wall:    No tenderness or deformity  Heart:    Irregular rate and rhythm, S1 and S2 normal,  Murmur,no  rub   or gallop  Abdomen:     Soft, non-tender, bowel sounds active all four quadrants,    no masses, no organomegaly         Extremities:   Extremities normal, atraumatic, no cyanosis or edema  Pulses:   2+ and symmetric all extremities  Skin:   Skin color, texture, turgor normal, no rashes or lesions  Lymph nodes:   Cervical, supraclavicular, and axillary nodes normal  Neurologic:   CNII-XII intact. Normal strength, sensation and reflexes      throughout    Labs on Admission:  Basic Metabolic Panel:  Recent Labs Lab 08/29/12 0629  NA 131*  K 4.0  CL 94*  CO2 23  GLUCOSE 336*  BUN 28*  CREATININE 2.79*  CALCIUM 12.2*   CBC:  Recent Labs Lab 08/29/12 0629  WBC 8.0  HGB 10.4*  HCT 32.5*  MCV 80.0  PLT 227   Cardiac Enzymes: No results found for this basename: CKTOTAL, CKMB, CKMBINDEX, TROPONINI,  in the last 168 hours  BNP (last 3 results)  Recent Labs  10/03/11 2346  PROBNP 535.5*   CBG: No results found for this basename: GLUCAP,  in the last 168 hours  Radiological Exams on Admission: Dg Chest Port 1 View  08/29/2012  *RADIOLOGY REPORT*  Clinical Data: Chest pain.  Sternal pain.  PORTABLE CHEST - 1 VIEW  Comparison: 10/03/2011.  Findings: Cardiomegaly.  CABG/median sternotomy.  Dual lead right subclavian pacemaker appears similar. Monitoring leads are projected over the chest.  There is no airspace disease.  No pleural effusion. No pneumothorax.  IMPRESSION: No active cardiopulmonary disease.  Mild cardiomegaly without failure.  No interval change.   Original Report Authenticated By: Andreas Newport, M.D.     EKG: Independently reviewed. A fib, HR 60.   Assessment/Plan Active Problems:   HYPOTHYROIDISM   DIABETES MELLITUS, TYPE II   ANEMIA, IRON DEFICIENCY   HYPERTENSION   Atrial fibrillation   Chest pain   Acute renal failure   1-Chest pain: probably GI in origin, esophagitis, gastritis. Also in the differential pancreatitis, PUD. Due to multiple risk factors and prior history of CAD will rule out for ACS. Cycle cardiac enzymes, check lipase, LFT. I will start protonix  IV. Check guaiac stool. Clear diet. If not improvement of symptoms could consider GI Consultation.  2-Acute Renal Failure: In setting of poor oral intake and history of vomiting. Will discontinue Metformin. IV fluids. Check UA, urine Na, Cr. If not improvement with IV fluids, will need Renal US.    3-A Fib: Continue with coumadin, monitor for bleeding. Continue with digoxin.   4-Diabetes: uncontrolled. Last HB-A1c at 10. He is not able to afford Januvia. He will probably benefit from insulin. Will probably need to hold metformin at discharge due to renal failure.   5-Anemia, Iron deficiency and B 12 deficiency: Continue with supplement. He was evaluated by Dr Leone Payor on 08-14-2012 no plan for endoscopy/ colonoscopy at that time.  6-Hypothyroidism: Continue with Synthroid.  7-CAD: Continue with aspirin, BB, Pravachol. 8-HTN: Hold Norvasc.  9-Cataract Surgery: Daughter will bring medications.  Addendum:  10-Hypercalcemia: IV fluids. Repeat in am. Probably related to renal failure.       Code Status: Full Code, patient wishes.  Family Communication: Care discussed with Daughter who was at bedside.  Disposition Plan: expect 3 to 4 days inpatient.   Time spent: 75 minutes.   Mardella Nuckles [facility] Pager [phone]  If 7PM-7AM, please contact night-coverage [email or web address] Password [email or web address] 08/29/2012, 9:24 AM

## 2012-08-29 NOTE — ED Provider Notes (Signed)
History     CSN: [id number]  Arrival date & time 08/29/12  4098   First MD Initiated Contact with Patient 08/29/12 [phone]      Chief Complaint  Patient presents with  . Chest Pain    (Consider location/radiation/quality/duration/timing/severity/associated sxs/prior treatment) HPI Comments: Pt with h/o CAD s/p 3 cardiac stents, atrial fibrillation and pacemaker, reports onset last night about 8 PM of persistent mid sternal anterior chest pain, burning, moderate to severe intensity.  He doesn't think its his heart.  No N/V, no sweats, no SOB.  He had not eaten dinner last night, pain kept him up all night.  Didn't try any meds at home.  Takes baby aspirin daily, has NTG, but didn't take any because he didn't think it was his heart.  No fevers, chills, cough, cold symptoms.  No abd pain, back pain.    The history is provided by the patient and medical records.    Past Medical History  Diagnosis Date  . Diabetes mellitus, type 2   . CAD (coronary artery disease)   . Hypertension   . Hyperlipidemia   . Atrial fibrillation   . AAA (abdominal aortic aneurysm)     next u/s due 03-2012  . Hypothyroidism   . BPH (benign prostatic hyperplasia)     reports aprocedure (TURP) remotely in [place].Marland KitchenNo futher f/u w/ urology  . GERD (gastroesophageal reflux disease)   . Insomnia     transient  . Vitamin B12 deficiency   . Osteopenia     dexa 2-11  . Diverticulosis     left colon  . Anemia     Past Surgical History  Procedure Laterality Date  . Pacemaker  1980, 1995, 2013    medtronic minix 8341  . Cholecystectomy, laparoscopic    . Prostatectomy      transurethral  . Inguinal herniorrhaphies      bilateral  . Right hip replacement    . Coronary artery bypass graft      1999 stents in 2000  . Penile prosthesis implant  1992  . Transthoracic echocardiogram  12/2006  . Cardiac catheterization  01/31/06, 09/25/10, 09-2011  . Esophagogastroduodenoscopy      multiple  . Colonoscopy       multiple  . Cataract extraction      right    Family History  Problem Relation Age of Onset  . Alzheimer's disease Mother   . Mental illness Mother     alzheimers  . Heart failure Father   . Stroke Father     cva  . Heart disease Father     chf  . Heart disease Brother     maybe  . Diabetes Other     several siblings  . Coronary artery disease Brother     cabg  . Alzheimer's disease Sister   . Mental illness Sister     alzheimers  . Cancer Neg Hx     History  Substance Use Topics  . Smoking status: Never Smoker   . Smokeless tobacco: Never Used  . Alcohol Use: Yes     Comment: rare      Review of Systems  Constitutional: Positive for appetite change. Negative for fever, chills, diaphoresis and activity change.  Respiratory: Negative for shortness of breath.   Cardiovascular: Positive for chest pain. Negative for palpitations.  Gastrointestinal: Negative for nausea, vomiting and abdominal pain.  All other systems reviewed and are negative.    Allergies  Ace inhibitors; Codeine phosphate; and  Hydrochlorothiazide w-triamterene  Home Medications   Current Outpatient Rx  Name  Route  Sig  Dispense  Refill  . amLODipine (NORVASC) 5 MG tablet   Oral   Take 5 mg by mouth daily.         [name] aspirin 81 MG tablet   Oral   Take 81 mg by mouth daily.         [name] atenolol (TENORMIN) 50 MG tablet   Oral   Take 1 tablet (50 mg total) by mouth 2 (two) times daily.   180 tablet   1   . Cyanocobalamin (B-12) 1000 MCG CAPS   Oral   Take 1 capsule by mouth daily.   30 capsule   11   . digoxin (LANOXIN) 0.125 MG tablet   Oral   Take 0.125 mg by mouth daily.         . fenofibrate micronized (LOFIBRA) 134 MG capsule   Oral   Take 134 mg by mouth daily before breakfast.         . ferrous sulfate 325 (65 FE) MG EC tablet   Oral   Take 1 tablet (325 mg total) by mouth 2 (two) times daily with a meal.   60 tablet   11   . levothyroxine (SYNTHROID,  LEVOTHROID) 88 MCG tablet   Oral   Take 1 tablet (88 mcg total) by mouth daily.   90 tablet   1   . metFORMIN (GLUCOPHAGE) 850 MG tablet   Oral   Take 1 tablet (850 mg total) by mouth 2 (two) times daily with a meal.   180 tablet   1   . nitroGLYCERIN (NITROSTAT) 0.4 MG SL tablet   Sublingual   Place 0.4 mg under the tongue every 5 (five) minutes as needed. For chest pain         . omeprazole (PRILOSEC) 40 MG capsule   Oral   Take 40 mg by mouth daily as needed. For acid reflux         . pravastatin (PRAVACHOL) 40 MG tablet   Oral   Take 80 mg by mouth daily.          . sitaGLIPtin (JANUVIA) 100 MG tablet   Oral   Take 1 tablet (100 mg total) by mouth daily.   90 tablet   0   . warfarin (COUMADIN) 3 MG tablet   Oral   Take 3-4.5 mg by mouth daily. Take 1.5 tablet on Wed, and Fri, take 1 tablet all other days           BP 118/60  Pulse 60  Temp(Src) 97.9 F (36.6 C) (Oral)  Resp 14  SpO2 97%  Physical Exam  Nursing note and vitals reviewed. Constitutional: He appears well-developed and well-nourished. No distress.  HENT:  Head: Normocephalic and atraumatic.  Eyes: Conjunctivae and EOM are normal. No scleral icterus.  Neck: Normal range of motion. Neck supple. No JVD present.  Cardiovascular: Normal rate, regular rhythm and intact distal pulses.   No murmur heard. Pulmonary/Chest: Effort normal. No respiratory distress. He has no wheezes.  Abdominal: He exhibits no distension. There is no tenderness. There is no rebound and no guarding.  Neurological: He is alert.  Skin: Skin is warm and dry. No rash noted. He is not diaphoretic.    ED Course  Procedures (including critical care time)  Labs Reviewed  CBC - Abnormal; Notable for the following:    RBC 4.06 (*)  Hemoglobin 10.4 (*)    HCT 32.5 (*)    MCH 25.6 (*)    RDW 17.4 (*)    All other components within normal limits  BASIC METABOLIC PANEL - Abnormal; Notable for the following:     Sodium 131 (*)    Chloride 94 (*)    Glucose, Bld 336 (*)    BUN 28 (*)    Creatinine, Ser 2.79 (*)    Calcium 12.2 (*)    GFR calc non Af Amer 19 (*)    GFR calc Af Amer 22 (*)    All other components within normal limits  POCT I-STAT TROPONIN I   Dg Chest Port 1 View  08/29/2012  *RADIOLOGY REPORT*  Clinical Data: Chest pain.  Sternal pain.  PORTABLE CHEST - 1 VIEW  Comparison: 10/03/2011.  Findings: Cardiomegaly.  CABG/median sternotomy.  Dual lead right subclavian pacemaker appears similar. Monitoring leads are projected over the chest.  There is no airspace disease.  No pleural effusion. No pneumothorax.  IMPRESSION: No active cardiopulmonary disease.  Mild cardiomegaly without failure.  No interval change.   Original Report Authenticated By: Andreas Newport, M.D.      1. Chest pain   2. Renal insufficiency   3. Hypercalcemia   4. Hyperglycemia     EKG at time of 06:24, shows underlying atrial fibrillation, ventricular paced rhythm at a rate of 60. Interpretation is abnormal EKG. Pacing is more evident compared to EKG from 06/03/2012, otherwise no significant changes.   7:44 AM Concerns due to elevated Cr and elevated Calcium as well.  Glucose is up at 336, no sig abn anion gap.    7:52 AM Pt reports no further CP at this moment, feels improved.  When asked, pt reports has been taking a lot of Tums for his chest discomfort thinking it was heartburn which could explain his elevated Ca and worse renal insuff.  Will consult [facility] to see pt and admit for renal insuff, CP, hypercalcemia.  Pt given IVF bolus and will continue to monitor.   MDM  Patient with episodic chest discomfort but no associated diaphoresis, nausea, sweats, shortness of breath. Given age and prior history of coronary disease, plan is to try nitroglycerin, serial troponins. Given the patient is prone to some heartburn problems, and the patient       Gavin Pound. Oletta Lamas, MD 08/29/12 [phone]

## 2012-08-29 NOTE — Progress Notes (Signed)
ANTICOAGULATION CONSULT NOTE - Follow Up Consult  Pharmacy Consult for Coumadin Indication: atrial fibrillation  Allergies  Allergen Reactions  . Ace Inhibitors     REACTION: cough  . Codeine Phosphate     REACTION: nausea  . Hydrochlorothiazide W-Triamterene     REACTION: cramps    Patient Measurements: Height: 5\' 7"  (170.2 cm) Weight: 168 lb (76.204 kg) IBW/kg (Calculated) : 66.1   Vital Signs: Temp: 98.6 F (37 C) (04/19 1427) Temp src: Oral (04/19 1427) BP: 131/67 mmHg (04/19 1427) Pulse Rate: 59 (04/19 1427)  Labs:  Recent Labs  08/29/12 0629 08/29/12 1005 08/29/12 1032  HGB 10.4*  --   --   HCT 32.5*  --   --   PLT 227  --   --   LABPROT  --   --  17.9*  INR  --   --  1.52*  CREATININE 2.79*  --   --   TROPONINI  --  <0.30  --     Estimated Creatinine Clearance: 18.4 ml/min (by C-G formula based on Cr of 2.79).    Assessment: 62 yoM admitted 4/19 with chest pain that started 2 days PTA. Had cataract surgery on Wed. His extensive cardiac history includes known CAD s/p 3 cardiac stents, atrial fibrillation, and a pacemaker d/t symptomatic bradycardia.  Home dose of Coumadin is 3mg  daily except 4.5mg  on W/F. Pt states last dose was Tues 4/15 due to N/V. Pt states that he has been able to keep down oral meds so far this admission. Hbg 10.4 (baseline ~11), plts ok.   Goal of Therapy:  INR 2-3 Monitor platelets by anticoagulation protocol: Yes   Plan:  - coumadin 7.5mg  PO x 1 tonight - daily INR, q72hr CBC - monitor for bleeding - f/u ability to keep down oral meds  Thank you for the consult.  Tomi Bamberger, PharmD Clinical Pharmacist Pager: [phone] Pharmacy: [phone] 08/29/2012 2:38 PM

## 2012-08-29 NOTE — ED Notes (Signed)
New and Old EKG given to Dr Oletta Lamas.

## 2012-08-30 DIAGNOSIS — E1310 Other specified diabetes mellitus with ketoacidosis without coma: Secondary | ICD-10-CM

## 2012-08-30 LAB — GLUCOSE, CAPILLARY: Glucose-Capillary: 164 mg/dL — ABNORMAL HIGH (ref 70–99)

## 2012-08-30 LAB — COMPREHENSIVE METABOLIC PANEL
ALT: 10 U/L (ref 0–53)
Albumin: 3.2 g/dL — ABNORMAL LOW (ref 3.5–5.2)
Alkaline Phosphatase: 23 U/L — ABNORMAL LOW (ref 39–117)
Potassium: 4.1 mEq/L (ref 3.5–5.1)
Sodium: 133 mEq/L — ABNORMAL LOW (ref 135–145)
Total Protein: 6.2 g/dL (ref 6.0–8.3)

## 2012-08-30 LAB — PROTIME-INR: INR: 1.84 — ABNORMAL HIGH (ref 0.00–1.49)

## 2012-08-30 MED ORDER — WARFARIN SODIUM 5 MG PO TABS
5.0000 mg | ORAL_TABLET | Freq: Once | ORAL | Status: AC
  Administered 2012-08-30: 5 mg via ORAL
  Filled 2012-08-30: qty 1

## 2012-08-30 MED ORDER — INSULIN GLARGINE 100 UNIT/ML ~~LOC~~ SOLN
5.0000 [IU] | Freq: Every day | SUBCUTANEOUS | Status: DC
  Administered 2012-08-30: 5 [IU] via SUBCUTANEOUS
  Filled 2012-08-30 (×2): qty 0.05

## 2012-08-30 NOTE — Progress Notes (Signed)
ANTICOAGULATION CONSULT NOTE - Follow Up Consult  Pharmacy Consult for Coumadin Indication: atrial fibrillation  Allergies  Allergen Reactions  . Ace Inhibitors     REACTION: cough  . Codeine Phosphate     REACTION: nausea  . Hydrochlorothiazide W-Triamterene     REACTION: cramps    Patient Measurements: Height: 5\' 7"  (170.2 cm) Weight: 169 lb 3.2 oz (76.749 kg) (scale c) IBW/kg (Calculated) : 66.1   Vital Signs: Temp: 97.2 F (36.2 C) (04/20 0518) Temp src: Oral (04/20 0518) BP: 137/56 mmHg (04/20 0518) Pulse Rate: 60 (04/20 0518)  Labs:  Recent Labs  08/29/12 0629 08/29/12 1005 08/29/12 1032 08/29/12 1533 08/29/12 2145 08/30/12 0615  HGB 10.4*  --   --   --   --   --   HCT 32.5*  --   --   --   --   --   PLT 227  --   --   --   --   --   LABPROT  --   --  17.9*  --   --  20.6*  INR  --   --  1.52*  --   --  1.84*  CREATININE 2.79*  --   --   --   --   --   TROPONINI  --  <0.30  --  <0.30 <0.30  --     Estimated Creatinine Clearance: 18.4 ml/min (by C-G formula based on Cr of 2.79).    Assessment: 18 yoM admitted 4/19 with chest pain that started 2 days PTA. Had cataract surgery on Wed. His extensive cardiac history includes known CAD s/p 3 cardiac stents, atrial fibrillation, and a pacemaker d/t symptomatic bradycardia.   Home dose of Coumadin is 3mg  daily except 4.5mg  on W/F. Pt states last dose was Tues 4/15 due to N/V. Pt states that he has been able to keep down oral meds so far this admission, no emesis occurrences charted overnight.   INR today remains SUB-therapeutic at 1.84 with increase from 1.52 after 7.5mg  x1. Labs from 4/19: Hbg 10.4 (baseline ~11), plts ok.   Goal of Therapy:  INR 2-3 Monitor platelets by anticoagulation protocol: Yes   Plan:  - coumadin 5mg  PO x 1 tonight - daily INR, q72hr CBC - monitor for bleeding - f/u ability to keep down oral meds   Thank you for the consult.  Tomi Bamberger, PharmD Clinical Pharmacist Pager:  [phone] Pharmacy: [phone] 08/30/2012 8:34 AM

## 2012-08-30 NOTE — Progress Notes (Signed)
  Echocardiogram 2D Echocardiogram has been performed.  Daryus Sowash 08/30/2012, 11:03 AM 

## 2012-08-30 NOTE — Progress Notes (Signed)
[facility] PROGRESS NOTE  [name] ZOX:[id number] DOB: 1925/09/18 DOA: 08/29/2012 PCP: [name], MD  Assessment/Plan: 1-Chest pain: probably GI in origin, esophagitis/gastritis. Mild pancreatitis. Due to multiple risk factors and prior history of CAD will rule out for ACS as cardiac enzymes negative, Clear diet and advance as tolerated- has had multiple endoscopies  2-Acute Renal Failure: In setting of poor oral intake and history of vomiting. Will discontinue Metformin. IV fluids. Check UA, urine Na, Cr. Check u/s  3-A Fib: Continue with coumadin, monitor for bleeding. Continue with digoxin.   4-Diabetes: uncontrolled. Last HB-A1c at 10. He is not able to afford Januvia. Add lantus for better coverage.   Will probably need to hold metformin at discharge due to renal failure.    5-Anemia, Iron deficiency and B 12 deficiency: Continue with supplement. He was evaluated by Dr Leone Payor on 08-14-2012 no plan for endoscopy/ colonoscopy at that time.   6-Hypothyroidism: Continue with Synthroid.   7-CAD: Continue with aspirin, BB, Pravachol.   8-HTN: Hold Norvasc.   9-Cataract Surgery: Daughter will bring medications.   10-Hypercalcemia: IV fluids. Repeat in am. Probably related to renal failure.    Code Status: full Family Communication: patient at bedside Disposition Plan: home soon   Consultants:  none  Procedures:  Echo  abd u/s  Antibiotics:    HPI/Subjective: Feeling better Pain in chest has gone away  Objective: Filed Vitals:   08/29/12 2010 08/30/12 0019 08/30/12 0518 08/30/12 0941  BP: 133/55 124/65 137/56 124/68  Pulse: 60 62 60 60  Temp: 97.7 F (36.5 C)  97.2 F (36.2 C)   TempSrc: Oral  Oral   Resp: 18 18 16    Height:      Weight:   76.749 kg (169 lb 3.2 oz)   SpO2: 98% 95% 99%     Intake/Output Summary (Last 24 hours) at 08/30/12 1042 Last data filed at 08/30/12 1001  Gross per 24 hour  Intake   1431 ml  Output   1975 ml  Net   -544  ml   Filed Weights   08/29/12 0900 08/29/12 1029 08/30/12 0518  Weight: 81.2 kg (179 lb 0.2 oz) 76.204 kg (168 lb) 76.749 kg (169 lb 3.2 oz)    Exam:   General:  A+Ox3, NAD  Cardiovascular: rrr  Respiratory: clear anterior  Abdomen: +BS, soft, NT  Musculoskeletal: moves all 4 ext   Data Reviewed: Basic Metabolic Panel:  Recent Labs Lab 08/29/12 0629 08/30/12 0912  NA 131* 133*  K 4.0 4.1  CL 94* 99  CO2 23 26  GLUCOSE 336* 182*  BUN 28* 27*  CREATININE 2.79* 2.46*  CALCIUM 12.2* 10.1   Liver Function Tests:  Recent Labs Lab 08/29/12 1005 08/30/12 0912  AST 17 17  ALT 10 10  ALKPHOS 25* 23*  BILITOT 0.5 0.4  PROT 6.7 6.2  ALBUMIN 3.5 3.2*    Recent Labs Lab 08/29/12 1005 08/30/12 0912  LIPASE 113* 106*   No results found for this basename: AMMONIA,  in the last 168 hours CBC:  Recent Labs Lab 08/29/12 0629  WBC 8.0  HGB 10.4*  HCT 32.5*  MCV 80.0  PLT 227   Cardiac Enzymes:  Recent Labs Lab 08/29/12 1005 08/29/12 1533 08/29/12 2145  TROPONINI <0.30 <0.30 <0.30   BNP (last 3 results)  Recent Labs  10/03/11 2346  PROBNP 535.5*   CBG:  Recent Labs Lab 08/29/12 1100 08/29/12 1620 08/29/12 2134 08/30/12 0549  GLUCAP 231* 160* 164*  168*    No results found for this or any previous visit (from the past 240 hour(s)).   Studies: Dg Chest Port 1 View  08/29/2012  *RADIOLOGY REPORT*  Clinical Data: Chest pain.  Sternal pain.  PORTABLE CHEST - 1 VIEW  Comparison: 10/03/2011.  Findings: Cardiomegaly.  CABG/median sternotomy.  Dual lead right subclavian pacemaker appears similar. Monitoring leads are projected over the chest.  There is no airspace disease.  No pleural effusion. No pneumothorax.  IMPRESSION: No active cardiopulmonary disease.  Mild cardiomegaly without failure.  No interval change.   Original Report Authenticated By: Andreas Newport, M.D.     Scheduled Meds: . atenolol  50 mg Oral BID  . Bromfenac Sodium  1 drop  Ophthalmic QHS  . digoxin  0.125 mg Oral QODAY  . docusate sodium  100 mg Oral BID  . ferrous sulfate  325 mg Oral BID WC  . insulin aspart  0-9 Units Subcutaneous TID WC  . latanoprost  1 drop Both Eyes QHS  . levothyroxine  88 mcg Oral QAC breakfast  . moxifloxacin  1 drop Left Eye QID  . pantoprazole (PROTONIX) IV  40 mg Intravenous Q12H  . prednisoLONE acetate  1 drop Left Eye QID  . simvastatin  40 mg Oral q1800  . sodium chloride  3 mL Intravenous Q12H  . vitamin B-12  1,000 mcg Oral Daily  . warfarin  5 mg Oral ONCE-1800  . Warfarin - Pharmacist Dosing Inpatient   Does not apply q1800   Continuous Infusions: . sodium chloride 75 mL/hr at 08/29/12 0945    Active Problems:   HYPOTHYROIDISM   DIABETES MELLITUS, TYPE II   ANEMIA, IRON DEFICIENCY   HYPERTENSION   Atrial fibrillation   Chest pain   Acute renal failure    Time spent: 35    [facility], Ruhama Lehew  [facility] Pager [phone]  If 7PM-7AM, please contact night-coverage at [email or web address], password [facility] 08/30/2012, 10:42 AM  LOS: 1 day

## 2012-08-31 ENCOUNTER — Other Ambulatory Visit: Payer: Medicare Other

## 2012-08-31 ENCOUNTER — Inpatient Hospital Stay (HOSPITAL_COMMUNITY): Payer: Medicare Other

## 2012-08-31 DIAGNOSIS — R7309 Other abnormal glucose: Secondary | ICD-10-CM

## 2012-08-31 DIAGNOSIS — E119 Type 2 diabetes mellitus without complications: Secondary | ICD-10-CM

## 2012-08-31 LAB — COMPREHENSIVE METABOLIC PANEL
ALT: 9 U/L (ref 0–53)
AST: 15 U/L (ref 0–37)
Alkaline Phosphatase: 24 U/L — ABNORMAL LOW (ref 39–117)
CO2: 23 mEq/L (ref 19–32)
Calcium: 9.2 mg/dL (ref 8.4–10.5)
GFR calc Af Amer: 27 mL/min — ABNORMAL LOW (ref >90)
GFR calc non Af Amer: 23 mL/min — ABNORMAL LOW (ref >90)
Glucose, Bld: 142 mg/dL — ABNORMAL HIGH (ref 70–99)
Potassium: 3.6 mEq/L (ref 3.5–5.1)
Sodium: 138 mEq/L (ref 135–145)
Total Protein: 6.2 g/dL (ref 6.0–8.3)

## 2012-08-31 LAB — GLUCOSE, CAPILLARY: Glucose-Capillary: 169 mg/dL — ABNORMAL HIGH (ref 70–99)

## 2012-08-31 LAB — PROTIME-INR
INR: 2.58 — ABNORMAL HIGH (ref 0.00–1.49)
Prothrombin Time: 26.4 seconds — ABNORMAL HIGH (ref 11.6–15.2)

## 2012-08-31 LAB — CBC
Hemoglobin: 9.3 g/dL — ABNORMAL LOW (ref 13.0–17.0)
RBC: 3.64 MIL/uL — ABNORMAL LOW (ref 4.22–5.81)
WBC: 5.3 10*3/uL (ref 4.0–10.5)

## 2012-08-31 MED ORDER — INSULIN PEN STARTER KIT
1.0000 | Freq: Once | Status: DC
  Administered 2012-08-31: 1

## 2012-08-31 MED ORDER — INSULIN PEN STARTER KIT: 1.0000 | Freq: Once | Status: DC

## 2012-08-31 MED ORDER — INSULIN GLARGINE 100 UNIT/ML ~~LOC~~ SOLN: 5.0000 [IU] | Freq: Every day | SUBCUTANEOUS | Status: DC

## 2012-08-31 MED ORDER — "BD GETTING STARTED TAKE HOME KIT: 1ML X 30 G SYRINGES, ": 1.0000 | Freq: Once | Status: DC

## 2012-08-31 MED ORDER — INSULIN ASPART 100 UNIT/ML ~~LOC~~ SOLN: SUBCUTANEOUS | Status: DC

## 2012-08-31 MED ORDER — "BD GETTING STARTED TAKE HOME KIT: 1ML X 30 G SYRINGES, "
1.0000 | Freq: Once | Status: AC
  Administered 2012-08-31: 1
  Filled 2012-08-31: qty 1

## 2012-08-31 MED ORDER — INSULIN PEN STARTER KIT
1.0000 | Freq: Once | Status: DC
  Filled 2012-08-31: qty 1

## 2012-08-31 MED ORDER — FREESTYLE SYSTEM KIT: 1.0000 | PACK | Status: DC | PRN

## 2012-08-31 NOTE — Progress Notes (Addendum)
Inpatient Diabetes Program Recommendations  AACE/ADA: New Consensus Statement on Inpatient Glycemic Control (2013)  Target Ranges:  Prepandial:   less than 140 mg/dL      Peak postprandial:   less than 180 mg/dL (1-2 hours)      Critically ill patients:  140 - 180 mg/dL   Reason for Visit: New to insulin  Note: Patient new to insulin.  Had given an injection at lunch.  Nurse said he did very well with preparation and administration.  Has an insulin starter kit at bedside.  Showed patient where to find instructions that he can use as he prepares and gives insulin at home.    Patient asked about using an insulin pen at discharge.  Showed patient a sample Lantus Solostar pen.  Patient states that he much prefers the solostar over vial and syringe as long as it is not too expensive.    [employer].  They were unable to give me the member price for either vial or pen-- stated it had to go through the patient's pharmacy before the price could be determined.  The Customer Support representative did state that meters and supplies would be covered at 100% no matter what brand as long as Medicare paid 80%.  BCBS would then pay the remaining 20%.  Called [employer].  They are unable to give a price until the prescription is actually "run-through" their system.  She suggested the MD give a Rx both for Lantus vial & syringe and SoloStar pens and pen-needles.  When patient brings the Rx's, they will process both and he can select one or the other.  Discussed with Dr. Benjamine Mola who will write Rx's for both.  Thank you.  Renny Remer S. Elsie Lincoln, RN, CNS, CDE Inpatient Diabetes Program, team pager [phone]    Addendum:  Patient denies need for Home Health follow-up.  Has a daughter who uses the SoloStar who can help him prn (lives in his neighborhood) and he feels OK using vial and syringe-- just prefers the [employer]. Thank you.  Valina Maes S. Elsie Lincoln, RN, CNS, CDE Inpatient Diabetes Program, team pager  [phone]

## 2012-08-31 NOTE — Progress Notes (Signed)
ANTICOAGULATION CONSULT NOTE - Follow Up Consult  Pharmacy Consult for Coumadin Indication: atrial fibrillation  Labs:  Recent Labs  08/29/12 0629 08/29/12 1032 08/30/12 0615 08/30/12 0912 08/31/12 0650  HGB 10.4*  --   --   --  9.3*  HCT 32.5*  --   --   --  29.6*  PLT 227  --   --   --  192  LABPROT  --  17.9* 20.6*  --  26.4*  INR  --  1.52* 1.84*  --  2.58*  CREATININE 2.79*  --   --  2.46* 2.40*   Lab Results  Component Value Date   INR 2.58* 08/31/2012   INR 1.84* 08/30/2012   INR 1.52* 08/29/2012   Estimated Creatinine Clearance: 21.4 ml/min (by C-G formula based on Cr of 2.4).   Medications:  Scheduled:  . atenolol  50 mg Oral BID  . Bromfenac Sodium  1 drop Ophthalmic QHS  . digoxin  0.125 mg Oral QODAY  . docusate sodium  100 mg Oral BID  . ferrous sulfate  325 mg Oral BID WC  . insulin aspart  0-9 Units Subcutaneous TID WC  . insulin glargine  5 Units Subcutaneous QHS  . latanoprost  1 drop Both Eyes QHS  . levothyroxine  88 mcg Oral QAC breakfast  . moxifloxacin  1 drop Left Eye QID  . pantoprazole (PROTONIX) IV  40 mg Intravenous Q12H  . prednisoLONE acetate  1 drop Left Eye QID  . simvastatin  40 mg Oral q1800  . sodium chloride  3 mL Intravenous Q12H  . vitamin B-12  1,000 mcg Oral Daily  . [COMPLETED] warfarin  5 mg Oral ONCE-1800  . Warfarin - Pharmacist Dosing Inpatient   Does not apply q1800   Assessment:  79 y/o male admitted with chest pain.  Patient is on chronic Coumadin for a history of atrial fibrillation.    Large increase in INR 1.84 >> 2.58 following 5 mg dose of Coumadin yesterday.  No bleeding complications noted   No Drug Interactions identified.  Goal of Therapy:  INR 2-3   Plan:  Hold Coumadin today after large overnight increase in INR. Monitor for bleeding complications  Daily INR's, CBC.   Kathe Wirick, Deetta Perla.D 08/31/2012, 10:19 AM

## 2012-08-31 NOTE — Progress Notes (Signed)
Accessed the patient education channel for the patient, patient viewed basic diabetic video #506 on Insulin Injection and #510 on Blood Glucose monitoring, patient provided the right start Insulin kit and the starter pen kit, Alicia Amel RN Diabetes Coordinator in to see patient and coordinated plan of care best for patient with the MD, patient cost being considered, patient verbalizes basic understanding of insulin injection and blood glucose monitoring, Berle Mull RN

## 2012-08-31 NOTE — Discharge Summary (Signed)
Physician Discharge Summary  [name]:[id number] DOB: 03/19/1926 DOA: 08/29/2012  PCP: [name], MD  Admit date: 08/29/2012 Discharge date: 09/01/2012  Time spent: 35 minutes  Recommendations for Outpatient Follow-up:   Discharge Diagnoses:  Active Problems:   HYPOTHYROIDISM   DIABETES MELLITUS, TYPE II   ANEMIA, IRON DEFICIENCY   HYPERTENSION   Atrial fibrillation   Chest pain   Acute renal failure   Discharge Condition: improved  Diet recommendation: diabetic/cardiac  Filed Weights   08/29/12 1029 08/30/12 0518 08/31/12 0436  Weight: 76.204 kg (168 lb) 76.749 kg (169 lb 3.2 oz) 78.336 kg (172 lb 11.2 oz)    History of present illness:  [name] is a 79 y.o. male with PMH significant for CAD, HTN, Diabetes uncontrolled, A fib, history of symptomatic bradycardia S/P pacemaker who presents to ED complaining of chest pain that started 2 days prior to admission. He had Cataract surgery 4 days prior to admission, that day he ate chicken sandwich and vomit twice. He has had decrease appetite. He has Not been eating well for last 4 days. He relates chest pain gets worse when he eats. Not pleuritic in nature. Chest pain last 5 minutes, not associated with dyspnea. It is sharp in quality. He is chest pain free at this time. Chest pain is different to pain he had with prior MI. He denies black stool, hematochezia.    Hospital Course:  1-Chest pain: probably GI in origin, esophagitis/gastritis. Mild pancreatitis. Due to multiple risk factors and prior history of CAD; ruled out for ACS as cardiac enzymes negative, eating regular meals and no further pain/discomfort- has had multiple endoscopies   2-Acute Renal Failure: In setting of poor oral intake and history of vomiting. Will discontinue Metformin. IV fluids. U/s does not show hydronephrosis- trend as outpatient  3-A Fib: Continue with coumadin, Continue with digoxin.   4-Diabetes: uncontrolled. Last HB-A1c at 10. He is not  able to afford Januvia. Added lantus and SSI- asked for diabetic coordinator to teach patient how to give shots and check BS- he expressed understanding- says daughter lives nearby and gives herself insulin  5-Anemia, Iron deficiency and B 12 deficiency: Continue with supplement. He was evaluated by Dr Leone Payor on 08-14-2012 no plan for endoscopy/ colonoscopy at that time.   6-Hypothyroidism: Continue with Synthroid.   7-CAD: Continue with aspirin, BB, Pravachol.   8-HTN: Hold Norvasc as BP good  9-Cataract Surgery: Daughter will bring medications.   10-Hypercalcemia: resolved    Procedures:  abd u/s  Echo: Study Conclusions  - Left ventricle: The cavity size was normal. Wall thickness was increased increased in a pattern of mild to moderate LVH. Systolic function was normal. The estimated ejection fraction was in the range of 50% to 55%. Wall motion was normal; there were no regional wall motion abnormalities. - Ventricular septum: These changes are consistent with right ventricular pacing. - Aortic valve: Trivial regurgitation. - Mitral valve: Mild regurgitation. - Left atrium: The atrium was mildly dilated. - Pulmonary arteries: Systolic pressure was mildly increased. PA peak pressure: 32mm Hg (S).     Consultations:  none  Discharge Exam: Filed Vitals:   08/30/12 2015 08/31/12 0436 08/31/12 1102 08/31/12 1427  BP: 121/65 132/66 133/49 138/76  Pulse: 60 52 60 60  Temp: 98.2 F (36.8 C) 98.1 F (36.7 C)  98 F (36.7 C)  TempSrc: Oral Oral  Oral  Resp: 18 18 20 20   Height:      Weight:  78.336  kg (172 lb 11.2 oz)    SpO2: 96% 98%  100%    General: A+Ox3, NAD Cardiovascular: rrr Respiratory: clear anterior  Discharge Instructions      Discharge Orders   Future Appointments Provider Department Dept Phone   09/03/2012 3:30 PM [facility] Nurse A [facility] at  [place] [phone]   09/08/2012 2:45 PM Wanda Plump, MD [facility] at   [place] [phone]   09/15/2012 3:15 PM Wanda Plump, MD [facility] at  [place] [phone]   09/17/2012 4:15 PM Marinus Maw, MD [facility] Main Office [place]) [phone]   Future Orders Complete By Expires     Diet - low sodium heart healthy  As directed     Diet Carb Modified  As directed     Discharge instructions  As directed     Comments:      BMP in 1 week Drink plenty of fluid F/u U/S HgbA1c in 3 months    Increase activity slowly  As directed         Medication List    STOP taking these medications       amLODipine 5 MG tablet  Commonly known as:  NORVASC     metFORMIN 850 MG tablet  Commonly known as:  GLUCOPHAGE     sitaGLIPtin 100 MG tablet  Commonly known as:  JANUVIA      TAKE these medications       aspirin 81 MG tablet  Take 81 mg by mouth daily.     atenolol 50 MG tablet  Commonly known as:  TENORMIN  Take 1 tablet (50 mg total) by mouth 2 (two) times daily.     B-12 1000 MCG Caps  Take 1 capsule by mouth daily.     bd getting started take home kit Misc  1 kit by Other route once.     digoxin 0.125 MG tablet  Commonly known as:  LANOXIN  Take 0.125 mg by mouth daily.     fenofibrate micronized 134 MG capsule  Commonly known as:  LOFIBRA  Take 134 mg by mouth daily before breakfast.     ferrous sulfate 325 (65 FE) MG EC tablet  Take 1 tablet (325 mg total) by mouth 2 (two) times daily with a meal.     Flexpen Starter Kit Misc  1 kit by Other route once.     glucose monitoring kit monitoring kit  1 each by Does not apply route as needed for other. For TID glucose testing     insulin aspart 100 UNIT/ML injection  Commonly known as:  novoLOG  0-9 Units, Subcutaneous, 3 times daily with meals  Correction coverage: Sensitive (thin, NPO, renal)  CBG < 70: implement hypoglycemia protocol  CBG 70 - 120: 0 units  CBG 121 - 150: 1 unit  CBG 151 - 200: 2 units  CBG 201 - 250: 3 units  CBG 251 -  300: 5 units  CBG 301 - 350: 7 units  CBG 351 - 400: 9 units     insulin glargine 100 UNIT/ML injection  Commonly known as:  LANTUS  Inject 0.05 mLs (5 Units total) into the skin at bedtime.     levothyroxine 88 MCG tablet  Commonly known as:  SYNTHROID, LEVOTHROID  Take 1 tablet (88 mcg total) by mouth daily.     nitroGLYCERIN 0.4 MG SL tablet  Commonly known as:  NITROSTAT  Place 0.4 mg under the tongue every 5 (five) minutes as  needed. For chest pain     omeprazole 40 MG capsule  Commonly known as:  PRILOSEC  Take 40 mg by mouth daily as needed. For acid reflux     pravastatin 40 MG tablet  Commonly known as:  PRAVACHOL  Take 80 mg by mouth daily.     warfarin 3 MG tablet  Commonly known as:  COUMADIN  Take 3-4.5 mg by mouth daily. Take 1.5 tablet on Wed, and Fri, take 1 tablet all other days       Follow-up Information   Follow up with [name], MD In 1 week. (for BS check and BMP)    Contact information:   [address] [facility] [address] [place] [address] [phone]        The results of significant diagnostics from this hospitalization (including imaging, microbiology, ancillary and laboratory) are listed below for reference.    Significant Diagnostic Studies: US Abdomen Complete  08/31/2012  *RADIOLOGY REPORT*  Clinical Data:  Abdominal pain  ABDOMINAL ULTRASOUND COMPLETE  Comparison:  None.  Findings:  Gallbladder:  Previous cholecystectomy.  Common Bile Duct:  Measures 6.7 mm.  Liver: No focal mass lesion identified.  There is diffuse increased parenchymal echogenicity.  IVC:  Not well seen.  Pancreas:  No abnormality identified.  Spleen:  Within normal limits in size and echotexture.  Right kidney:  Normal in size and parenchymal echogenicity.  No evidence of mass or hydronephrosis.  Left kidney:  Normal in size and parenchymal echogenicity.  No evidence of mass or hydronephrosis.  Abdominal Aorta:  The infrarenal abdominal aorta has an AP  dimension measuring 3.0 cm.  IMPRESSION:  1.  No acute findings. Prior cholecystectomy.  2.  Increased AP dimension of the infrarenal abdominal aorta which now measures 3 cm.  Based upon the current size of the aorta, imaging follow-up at 3-year intervals is recommended. 3.  Fatty infiltration of the liver   Original Report Authenticated By: Signa Kell, M.D.    Dg Chest Port 1 View  08/29/2012  *RADIOLOGY REPORT*  Clinical Data: Chest pain.  Sternal pain.  PORTABLE CHEST - 1 VIEW  Comparison: 10/03/2011.  Findings: Cardiomegaly.  CABG/median sternotomy.  Dual lead right subclavian pacemaker appears similar. Monitoring leads are projected over the chest.  There is no airspace disease.  No pleural effusion. No pneumothorax.  IMPRESSION: No active cardiopulmonary disease.  Mild cardiomegaly without failure.  No interval change.   Original Report Authenticated By: Andreas Newport, M.D.     Microbiology: No results found for this or any previous visit (from the past 240 hour(s)).   Labs: Basic Metabolic Panel:  Recent Labs Lab 08/29/12 0629 08/30/12 0912 08/31/12 0650  NA 131* 133* 138  K 4.0 4.1 3.6  CL 94* 99 105  CO2 23 26 23   GLUCOSE 336* 182* 142*  BUN 28* 27* 28*  CREATININE 2.79* 2.46* 2.40*  CALCIUM 12.2* 10.1 9.2   Liver Function Tests:  Recent Labs Lab 08/29/12 1005 08/30/12 0912 08/31/12 0650  AST 17 17 15   ALT 10 10 9   ALKPHOS 25* 23* 24*  BILITOT 0.5 0.4 0.3  PROT 6.7 6.2 6.2  ALBUMIN 3.5 3.2* 3.2*    Recent Labs Lab 08/29/12 1005 08/30/12 0912  LIPASE 113* 106*   No results found for this basename: AMMONIA,  in the last 168 hours CBC:  Recent Labs Lab 08/29/12 0629 08/31/12 0650  WBC 8.0 5.3  HGB 10.4* 9.3*  HCT 32.5* 29.6*  MCV  80.0 81.3  PLT 227 192   Cardiac Enzymes:  Recent Labs Lab 08/29/12 1005 08/29/12 1533 08/29/12 2145  TROPONINI <0.30 <0.30 <0.30   BNP: BNP (last 3 results)  Recent Labs  10/03/11 2346  PROBNP 535.5*    CBG:  Recent Labs Lab 08/30/12 1126 08/30/12 1600 08/30/12 2129 08/31/12 0545 08/31/12 1111  GLUCAP 153* 198* 266* 169* 202*       Signed:  Richie Bonanno  [facility] 09/01/2012, 1:05 PM

## 2012-08-31 NOTE — Progress Notes (Signed)
Utilization Review Completed.   Valmai Vandenberghe, RN, BSN Nurse Case Manager  [phone]  

## 2012-08-31 NOTE — Progress Notes (Signed)
Patient evaluated for community based chronic disease management services with [facility] Care Management Program as a benefit of patient's [employer]. Patient will receive a post discharge transition of care call and will be evaluated for monthly home visits for assessments and disease process education. Spoke with patient at bedside to explain [facility] Care Management services.  Services accepted services.  Written consents obtained and contact information verified.  Patient has concerns regarding the ongoing cost of his insulin pen and he would like some telephonic follow up for his diabetes management.  Left contact information and [facility] literature at bedside. Made inpatient Case Manager Ivonne Andrew RN aware that [facility] Care Management following. Of note, [facility] Care Management services does not replace or interfere with any services that are arranged by inpatient case management or social work.  For additional questions or referrals please contact Anibal Henderson BSN RN [facility] Liaison at [phone].

## 2012-08-31 NOTE — Discharge Planning (Signed)
Patient to be discharged to home accompanied by daughter via private vehicle to home, patient verbalizes basic administration of insulin via teachback, seen by diabetic coordinator, Berle Mull RN

## 2012-08-31 NOTE — Progress Notes (Signed)
Patient assisted to draw up his scheduled dose of Novolog insulin, explained  The difference in short acting and long acting Insulin.  Assisted to draw up 3 units of Novolog and self injected into RLQ abdomen, Berle Mull RN

## 2012-09-01 ENCOUNTER — Encounter: Payer: Self-pay | Admitting: Internal Medicine

## 2012-09-01 ENCOUNTER — Ambulatory Visit (INDEPENDENT_AMBULATORY_CARE_PROVIDER_SITE_OTHER): Payer: Medicare Other | Admitting: Internal Medicine

## 2012-09-01 VITALS — BP 138/72 | HR 60 | Wt 177.0 lb

## 2012-09-01 DIAGNOSIS — I10 Essential (primary) hypertension: Secondary | ICD-10-CM | POA: Diagnosis not present

## 2012-09-01 DIAGNOSIS — N179 Acute kidney failure, unspecified: Secondary | ICD-10-CM

## 2012-09-01 DIAGNOSIS — E119 Type 2 diabetes mellitus without complications: Secondary | ICD-10-CM

## 2012-09-01 DIAGNOSIS — D509 Iron deficiency anemia, unspecified: Secondary | ICD-10-CM

## 2012-09-01 DIAGNOSIS — I2510 Atherosclerotic heart disease of native coronary artery without angina pectoris: Secondary | ICD-10-CM | POA: Diagnosis not present

## 2012-09-01 MED ORDER — INSULIN PEN NEEDLE 32G X 4 MM MISC: Status: DC

## 2012-09-01 MED ORDER — GLUCOSE BLOOD VI STRP: ORAL_STRIP | Status: DC

## 2012-09-01 MED ORDER — INSULIN ASPART 100 UNIT/ML ~~LOC~~ SOLN: SUBCUTANEOUS | Status: DC

## 2012-09-01 MED ORDER — ONETOUCH ULTRASOFT LANCETS MISC: Status: DC

## 2012-09-01 MED ORDER — INSULIN GLARGINE 100 UNITS/ML SOLOSTAR PEN: 5.0000 [IU] | PEN_INJECTOR | Freq: Every day | SUBCUTANEOUS | Status: DC

## 2012-09-01 NOTE — Assessment & Plan Note (Signed)
acute renal injury, likely due to the lack of fluids, will recheck in one week, Encourage by mouth intake particularly fluids.[name]

## 2012-09-01 NOTE — Assessment & Plan Note (Signed)
Chest pain, resolved, workup of the hospital negative. GI related?[name]

## 2012-09-01 NOTE — Patient Instructions (Addendum)
lantus 5 units a at night ---- Check your blood sugar before breakfast and lunch CBG < 70:start your meal ASAP CBG 70 - 120: 0 units CBG 121 - 150: 1 unit CBG 151 - 200: 2 units CBG 201 - 250: 3 units CBG 251 - 300: 5 units CBG 301 - 350: 7 units CBG 351 - 400: 9 units ---- Call with blood sugar readings in 3 days ---- Check the  blood pressure 2 or 3 times a week, be sure it is between 110/60 and 140/85. If it is consistently higher or lower, let me know --- Drink plenty of fluids --- Next visit 1 week ----  Hypoglycemia (Low Blood Sugar) Hypoglycemia is when the glucose (sugar) in your blood is too low. Hypoglycemia can happen for many reasons. It can happen to people with or without diabetes. Hypoglycemia can develop quickly and can be a medical emergency.  CAUSES  Having hypoglycemia does not mean that you will develop diabetes. Different causes include:  Missed or delayed meals or not enough carbohydrates eaten.  Medication overdose. This could be by accident or deliberate. If by accident, your medication may need to be adjusted or changed.  Exercise or increased activity without adjustments in carbohydrates or medications.  A nerve disorder that affects body functions like your heart rate, blood pressure and digestion (autonomic neuropathy).  A condition where the stomach muscles do not function properly (gastroparesis). Therefore, medications may not absorb properly.  The inability to recognize the signs of hypoglycemia (hypoglycemic unawareness).  Absorption of insulin  may be altered.  Alcohol consumption.  Pregnancy/menstrual cycles/postpartum. This may be due to hormones.  Certain kinds of tumors. This is very rare. SYMPTOMS   Sweating.  Hunger.  Dizziness.  Blurred vision.  Drowsiness.  Weakness.  Headache.  Rapid heart beat.  Shakiness.  Nervousness. DIAGNOSIS  Diagnosis is made by monitoring blood glucose in one or all of the following  ways:  Fingerstick blood glucose monitoring.  Laboratory results. TREATMENT  If you think your blood glucose is low:  Check your blood glucose, if possible. If it is less than 70 mg/dl, take one of the following:  3-4 glucose tablets.   cup juice (prefer clear like apple).   cup "regular" soda pop.  1 cup milk.  -1 tube of glucose gel.  5-6 hard candies.  Do not over treat because your blood glucose (sugar) will only go too high.  Wait 15 minutes and recheck your blood glucose. If it is still less than 70 mg/dl (or below your target range), repeat treatment.  Eat a snack if it is more than one hour until your next meal. Sometimes, your blood glucose may go so low that you are unable to treat yourself. You may need someone to help you. You may even pass out or be unable to swallow. This may require you to get an injection of glucagon, which raises the blood glucose. HOME CARE INSTRUCTIONS  Check blood glucose as recommended by your caregiver.  Take medication as prescribed by your caregiver.  Follow your meal plan. Do not skip meals. Eat on time.  If you are going to drink alcohol, drink it only with meals.  Check your blood glucose before driving.  Check your blood glucose before and after exercise. If you exercise longer or different than usual, be sure to check blood glucose more frequently.  Always carry treatment with you. Glucose tablets are the easiest to carry.  Always wear medical alert jewelry or  carry some form of identification that states that you have diabetes. This will alert people that you have diabetes. If you have hypoglycemia, they will have a better idea on what to do. SEEK MEDICAL CARE IF:   You are having problems keeping your blood sugar at target range.  You are having frequent episodes of hypoglycemia.  You feel you might be having side effects from your medicines.  You have symptoms of an illness that is not improving after 3-4  days.  You notice a change in vision or a new problem with your vision. SEEK IMMEDIATE MEDICAL CARE IF:   You are a family member or friend of a person whose blood glucose goes below 70 mg/dl and is accompanied by:  Confusion.  A change in mental status.  The inability to swallow.  Passing out. Document Released: 04/29/2005 Document Revised: 07/22/2011 Document Reviewed: 08/26/2011 [facility] Patient Information 2013 [place], [place].

## 2012-09-01 NOTE — Assessment & Plan Note (Signed)
  Diabetes, based on last A1C was  Recommended to start [place]. At the recent admission metformin was appropriately discontinued due to to increase creatinine, januvia also d/c. Family and pt confused about a Lantus and NovoLog regimen. He needs a simple regimen: Start Lantus. His 2 biggest meals of the day are breakfast and lunch consequently will check blood sugars at those times and use the same sliding scale.  Plan is carefully discussed with the family, hypoglycemia discussed as well.

## 2012-09-01 NOTE — Assessment & Plan Note (Signed)
Hypertension, was recommended to stop amlodipine, BP today is stable, see instructions.

## 2012-09-01 NOTE — Assessment & Plan Note (Signed)
Hg at the hospital below baseline, recheck next week

## 2012-09-01 NOTE — Progress Notes (Signed)
  Subjective:    Patient ID: DIALLO PONDER, male    DOB: 1926-02-14, 79 y.o.   MRN: [id number]  HPI Patient is seen acutely today at the request of the family, he was admitted to the hospital 08-29-2012 for 2 days, discharged yesterday.  So far there is no discharge summary charted. The family is confused about the new medication regimen.  He was admitted with chest pain, acute renal failure, uncontrolled DM, recent cataract surgery and poor appetite. Troponins were negative, chest x-ray unremarkable, ultrasound show no acute abnormalities in the abdomen. Echocardiogram was unremarkable with  no wall motion abnormalities. Baseline creatinine is 1.3, during the admission he was noted to be elevated, last creatinine 2.4. Metformin was discontinued. They also d/c januvia and amlodipine. As far as the anemia, hemoglobin baseline was 11.1, 2 and admission was noted to be 9.3. He already saw GI (PTA)  and they recommended no further scopes. INR 08/31/2012 was 2.5.  Past Medical History  Diagnosis Date  . Diabetes mellitus, type 2   . CAD (coronary artery disease)   . Hypertension   . Hyperlipidemia   . Atrial fibrillation   . AAA (abdominal aortic aneurysm)     next u/s due 03-2012  . Hypothyroidism   . BPH (benign prostatic hyperplasia)     reports aprocedure (TURP) remotely in [place].Marland KitchenNo futher f/u w/ urology  . GERD (gastroesophageal reflux disease)   . Insomnia     transient  . Vitamin B12 deficiency   . Osteopenia     dexa 2-11  . Diverticulosis     left colon  . Anemia    Past Surgical History  Procedure Laterality Date  . Pacemaker  1980, 1995, 2013    medtronic minix 8341  . Cholecystectomy, laparoscopic    . Prostatectomy      transurethral  . Inguinal herniorrhaphies      bilateral  . Right hip replacement    . Coronary artery bypass graft      1999 stents in 2000  . Penile prosthesis implant  1992  . Transthoracic echocardiogram  12/2006  . Esophagogastroduodenoscopy       multiple  . Colonoscopy      multiple  . Cataract extraction      right  . Cardiac catheterization  01/31/06, 09/25/10, 09-2011    Review of Systems  has no further chest pain, denies heartburn. Still feeling weak, poor appetite. Did not have Lantus last night, had breakfast at 8 AM, this morning at around 10 AM blood sugar was 207, he got  6 units of NovoLog. No ambulatory BPs.     Objective:   Physical Exam BP 138/72  Pulse 60  Wt 177 lb (80.287 kg)  BMI 27.72 kg/m2  SpO2 98% General -- alert, well-developed, No apparent distress   Lungs -- normal respiratory effort, no intercostal retractions, no accessory muscle use, and normal breath sounds.   Heart-- Irregular Extremities-- no pretibial edema bilaterally  Psych-- Cognition and judgment appear intact. Alert and cooperative with normal attention span and concentration.  not anxious appearing and not depressed appearing.        Assessment & Plan:   His 2 daughters are very involved in his care. Will communicate with pt and daughters if needed. [name] ( lives in [place]) [phone] Roanna Raider ( lives out of town)  [phone]

## 2012-09-03 ENCOUNTER — Ambulatory Visit: Payer: Medicare Other

## 2012-09-04 ENCOUNTER — Telehealth: Payer: Self-pay | Admitting: *Deleted

## 2012-09-04 NOTE — Telephone Encounter (Signed)
Pt's CBG readings: 4.22.14 8:24a 153 4.23.14 7:35a 154   12:33p 165 4.24.14 6:56a 179 4.5.14 6:32a 175

## 2012-09-04 NOTE — Telephone Encounter (Signed)
Discussed with pt

## 2012-09-04 NOTE — Telephone Encounter (Signed)
These are good readings,We can't increase Lantus from 5 units to 8 units. If CBGs less than 100, go back to 5 units Followup next week as recommended.

## 2012-09-04 NOTE — Telephone Encounter (Signed)
Message copied by Nada Maclachlan on Fri Sep 04, 2012  9:10 AM ------      Message from: [name] E      Created: Tue Sep 01, 2012  6:33 PM       He is supposed to call w/ CBG readings this week, if he didn't, please call him ------

## 2012-09-08 ENCOUNTER — Ambulatory Visit (INDEPENDENT_AMBULATORY_CARE_PROVIDER_SITE_OTHER): Payer: Medicare Other | Admitting: Internal Medicine

## 2012-09-08 VITALS — BP 138/76 | HR 81 | Wt 176.0 lb

## 2012-09-08 DIAGNOSIS — I10 Essential (primary) hypertension: Secondary | ICD-10-CM | POA: Diagnosis not present

## 2012-09-08 DIAGNOSIS — N179 Acute kidney failure, unspecified: Secondary | ICD-10-CM

## 2012-09-08 DIAGNOSIS — E119 Type 2 diabetes mellitus without complications: Secondary | ICD-10-CM | POA: Diagnosis not present

## 2012-09-08 DIAGNOSIS — I4891 Unspecified atrial fibrillation: Secondary | ICD-10-CM | POA: Diagnosis not present

## 2012-09-08 MED ORDER — GLUCOSE BLOOD VI STRP: ORAL_STRIP | Status: DC

## 2012-09-08 MED ORDER — ONETOUCH ULTRASOFT LANCETS MISC: Status: DC

## 2012-09-08 MED ORDER — INSULIN PEN NEEDLE 32G X 4 MM MISC: Status: DC

## 2012-09-08 NOTE — Progress Notes (Signed)
  Subjective:    Patient ID: [name], male    DOB: May 11, 1926, 79 y.o.   MRN: [id number]  HPI  followup from previous visit, here with one of his daughters. Diabetes good medication compliance, is taking his blood sugars once a day early in the morning, CBGs ranged from 170-125. We increased Lantus to 8 units daily, not using the sliding  scale, not checking CBGs before lunch .He feels very well. Good medication compliance.  Past Medical History  Diagnosis Date  . Diabetes mellitus, type 2   . CAD (coronary artery disease)   . Hypertension   . Hyperlipidemia   . Atrial fibrillation   . AAA (abdominal aortic aneurysm)     next u/s due 03-2012  . Hypothyroidism   . BPH (benign prostatic hyperplasia)     reports aprocedure (TURP) remotely in [place].Marland KitchenNo futher f/u w/ urology  . GERD (gastroesophageal reflux disease)   . Insomnia     transient  . Vitamin B12 deficiency   . Osteopenia     dexa 2-11  . Diverticulosis     left colon  . Anemia    Past Surgical History  Procedure Laterality Date  . Pacemaker  1980, 1995, 2013    medtronic minix 8341  . Cholecystectomy, laparoscopic    . Prostatectomy      transurethral  . Inguinal herniorrhaphies      bilateral  . Right hip replacement    . Coronary artery bypass graft      1999 stents in 2000  . Penile prosthesis implant  1992  . Transthoracic echocardiogram  12/2006  . Esophagogastroduodenoscopy      multiple  . Colonoscopy      multiple  . Cataract extraction      right  . Cardiac catheterization  01/31/06, 09/25/10, 09-2011     Review of Systems No further chest pain. Diet has improved. No lower extremity edema.     Objective:   Physical Exam General -- alert, well-developed, No apparent distress  Lungs -- normal respiratory effort, no intercostal retractions, no accessory muscle use, and normal breath sounds.   Heart--Irregular   Extremities-- no pretibial edema bilaterally  Psych-- Cognition and judgment  appear intact. Alert and cooperative with normal attention span and concentration.  not anxious appearing and not depressed appearing.       Assessment & Plan:

## 2012-09-08 NOTE — Assessment & Plan Note (Addendum)
Well controlled, no change, no need to restart amlodipine at this point. Check a BMP CBC

## 2012-09-08 NOTE — Patient Instructions (Addendum)
Same coumadin, next check 4 weeks --- lantus 8 units a at night  ----  Check your blood sugar before breakfast and lunch  CBG < 70:start your meal ASAP  CBG 251 - 300: 5 units  CBG 301 - 350: 7 units  CBG 351 - 400: 9 units  ----  Bring a blood sugar log in 4 weeks  --- Check the blood pressure 2 or 3 times a week, be sure it is between 110/60 and 140/85. If it is consistently higher or lower, let me know --- Next visit 3 months

## 2012-09-08 NOTE — Assessment & Plan Note (Signed)
Check a BMP

## 2012-09-08 NOTE — Assessment & Plan Note (Addendum)
Well controlled on lantus 8 u qd. Recommend to check his blood sugar twice a day. Will change SS to be used only if CBG > 250 as he needs a simple regimen, see instructions

## 2012-09-08 NOTE — Assessment & Plan Note (Addendum)
Check an inr today-- 2.6, no change, 4 weeks

## 2012-09-09 ENCOUNTER — Ambulatory Visit: Payer: Medicare Other

## 2012-09-09 ENCOUNTER — Encounter: Payer: Self-pay | Admitting: Internal Medicine

## 2012-09-09 LAB — BASIC METABOLIC PANEL
Calcium: 8.3 mg/dL — ABNORMAL LOW (ref 8.4–10.5)
GFR: 45.57 mL/min — ABNORMAL LOW (ref >60.00)
Potassium: 4.1 mEq/L (ref 3.5–5.1)
Sodium: 139 mEq/L (ref 135–145)

## 2012-09-09 LAB — CBC WITH DIFFERENTIAL/PLATELET
Basophils Relative: 0.4 % (ref 0.0–3.0)
Eosinophils Relative: 4.3 % (ref 0.0–5.0)
HCT: 29.4 % — ABNORMAL LOW (ref 39.0–52.0)
Hemoglobin: 9.8 g/dL — ABNORMAL LOW (ref 13.0–17.0)
Lymphocytes Relative: 16 % (ref 12.0–46.0)
Lymphs Abs: 0.9 10*3/uL (ref 0.7–4.0)
Monocytes Relative: 10.3 % (ref 3.0–12.0)
Neutro Abs: 3.9 10*3/uL (ref 1.4–7.7)
RBC: 3.51 Mil/uL — ABNORMAL LOW (ref 4.22–5.81)

## 2012-09-09 NOTE — Addendum Note (Signed)
Addended by: Edwena Felty T on: 09/09/2012 03:00 PM   Modules accepted: Orders

## 2012-09-14 ENCOUNTER — Encounter: Payer: Self-pay | Admitting: *Deleted

## 2012-09-15 ENCOUNTER — Other Ambulatory Visit: Payer: Medicare Other

## 2012-09-15 ENCOUNTER — Other Ambulatory Visit: Payer: Self-pay | Admitting: Internal Medicine

## 2012-09-15 ENCOUNTER — Ambulatory Visit (INDEPENDENT_AMBULATORY_CARE_PROVIDER_SITE_OTHER): Payer: Medicare Other | Admitting: Internal Medicine

## 2012-09-15 ENCOUNTER — Telehealth: Payer: Self-pay | Admitting: *Deleted

## 2012-09-15 DIAGNOSIS — D509 Iron deficiency anemia, unspecified: Secondary | ICD-10-CM

## 2012-09-15 DIAGNOSIS — E119 Type 2 diabetes mellitus without complications: Secondary | ICD-10-CM

## 2012-09-15 LAB — HEMOCCULT SLIDES (X 3 CARDS)
Fecal Occult Blood: NEGATIVE
OCCULT 2: POSITIVE
OCCULT 3: POSITIVE
OCCULT 4: POSITIVE
OCCULT 5: NEGATIVE

## 2012-09-15 MED ORDER — INSULIN GLARGINE 100 UNITS/ML SOLOSTAR PEN: 8.0000 [IU] | PEN_INJECTOR | Freq: Every day | SUBCUTANEOUS | Status: DC

## 2012-09-15 NOTE — Telephone Encounter (Signed)
Pt's CBG readings: 4.22 153 4.23 154, 165 4.24 179  4.25 175 4.26 166 4.27 157 4.28 125  4.29 147 4.30 135 4.30 149 5.1 147, 151 5.2 153, 135 5.3 148 5.4 160, 185 5.5 165, 126 5.6 199

## 2012-09-16 NOTE — Telephone Encounter (Signed)
Discussed with pt

## 2012-09-16 NOTE — Progress Notes (Signed)
Quick Note:  These show some microscopic blood in the stools Could be from hemorrhoids but do not know Need to discuss possible further evaluation at an office visit ______

## 2012-09-16 NOTE — Telephone Encounter (Signed)
Increase Lantus to 10 units a day. Call with CBGs in 2 weeks

## 2012-09-17 ENCOUNTER — Ambulatory Visit (INDEPENDENT_AMBULATORY_CARE_PROVIDER_SITE_OTHER): Payer: Medicare Other | Admitting: Internal Medicine

## 2012-09-17 ENCOUNTER — Encounter: Payer: Self-pay | Admitting: Internal Medicine

## 2012-09-17 VITALS — BP 145/61 | HR 60 | Ht 67.0 in | Wt 171.4 lb

## 2012-09-17 DIAGNOSIS — I4891 Unspecified atrial fibrillation: Secondary | ICD-10-CM | POA: Diagnosis not present

## 2012-09-17 DIAGNOSIS — Z950 Presence of cardiac pacemaker: Secondary | ICD-10-CM

## 2012-09-17 DIAGNOSIS — I10 Essential (primary) hypertension: Secondary | ICD-10-CM | POA: Diagnosis not present

## 2012-09-17 MED ORDER — DIGOXIN 125 MCG PO TABS: 0.1250 mg | ORAL_TABLET | Freq: Every day | ORAL | Status: DC

## 2012-09-17 NOTE — Assessment & Plan Note (Signed)
His Medtronic pacemaker is working normally. We'll plan to recheck in several months.

## 2012-09-17 NOTE — Progress Notes (Signed)
HPI [name] returns today for followup. He is an 79 year old man with a history of symptomatic bradycardia, status post permanent pacemaker insertion over 30 years ago. He is status post his second replacement approximately one year ago. In the interim, no chest pain, shortness of breath, or syncope. No peripheral edema. HIe denies fevers or chills, night sweats, or recurrent nausea or vomiting. Allergies  Allergen Reactions  . Ace Inhibitors     REACTION: cough  . Codeine Phosphate     REACTION: nausea  . Hydrochlorothiazide W-Triamterene     REACTION: cramps     Current Outpatient Prescriptions  Medication Sig Dispense Refill  . aspirin 81 MG tablet Take 81 mg by mouth daily.      [name] atenolol (TENORMIN) 50 MG tablet Take 1 tablet (50 mg total) by mouth 2 (two) times daily.  180 tablet  1  . bd getting started take home kit MISC 1 kit by Other route once.  1 kit  0  . Cyanocobalamin (B-12) 1000 MCG CAPS Take 1 capsule by mouth daily.  30 capsule  11  . digoxin (LANOXIN) 0.125 MG tablet Take 1 tablet (0.125 mg total) by mouth daily.  90 tablet  3  . fenofibrate micronized (LOFIBRA) 134 MG capsule Take 134 mg by mouth daily before breakfast.      . ferrous sulfate 325 (65 FE) MG EC tablet Take 1 tablet (325 mg total) by mouth 2 (two) times daily with a meal.  60 tablet  11  . Flexpen Starter Kit MISC 1 kit by Other route once.  1 kit  0  . glucose blood test strip Use as instructed  100 each  12  . glucose monitoring kit (FREESTYLE) monitoring kit 1 each by Does not apply route as needed for other. For TID glucose testing  1 each  0  . insulin aspart (NOVOLOG) 100 UNIT/ML injection CBG 251 - 300: 5 units CBG 301 - 350: 7 units CBG 351 - 400: 9 units      . insulin glargine (LANTUS) 100 units/mL SOLN Inject 8 Units into the skin at bedtime.      . insulin glargine (LANTUS) 100 units/mL SOLN Inject 8 Units into the skin daily at 10 pm.  15 mL  0  . Insulin Pen Needle 32G X 4 MM MISC Use as  directed.  100 each  6  . Lancets (ONETOUCH ULTRASOFT) lancets Use as instructed  100 each  12  . levothyroxine (SYNTHROID, LEVOTHROID) 88 MCG tablet Take 1 tablet (88 mcg total) by mouth daily.  90 tablet  1  . nitroGLYCERIN (NITROSTAT) 0.4 MG SL tablet Place 0.4 mg under the tongue every 5 (five) minutes as needed. For chest pain      . omeprazole (PRILOSEC) 40 MG capsule Take 40 mg by mouth daily as needed. For acid reflux      . pravastatin (PRAVACHOL) 40 MG tablet Take 80 mg by mouth daily.       [name] warfarin (COUMADIN) 3 MG tablet Take 3-4.5 mg by mouth daily. Take 1.5 tablet on Wed, and Fri, take 1 tablet all other days       No current facility-administered medications for this visit.     Past Medical History  Diagnosis Date  . Diabetes mellitus, type 2   . CAD (coronary artery disease)   . Hypertension   . Hyperlipidemia   . Atrial fibrillation   . AAA (abdominal aortic aneurysm)     next  u/s due 03-2012  . Hypothyroidism   . BPH (benign prostatic hyperplasia)     reports aprocedure (TURP) remotely in [place].Marland KitchenNo futher f/u w/ urology  . GERD (gastroesophageal reflux disease)   . Insomnia     transient  . Vitamin B12 deficiency   . Osteopenia     dexa 2-11  . Diverticulosis     left colon  . Anemia     ROS:   All systems reviewed and negative except as noted in the HPI.   Past Surgical History  Procedure Laterality Date  . Pacemaker  1980, 1995, 2013    medtronic minix 8341  . Cholecystectomy, laparoscopic    . Prostatectomy      transurethral  . Inguinal herniorrhaphies      bilateral  . Right hip replacement    . Coronary artery bypass graft      1999 stents in 2000  . Penile prosthesis implant  1992  . Transthoracic echocardiogram  12/2006  . Esophagogastroduodenoscopy      multiple  . Colonoscopy      multiple  . Cataract extraction      right  . Cardiac catheterization  01/31/06, 09/25/10, 09-2011     Family History  Problem Relation Age of Onset   . Alzheimer's disease Mother   . Mental illness Mother     alzheimers  . Heart failure Father   . Stroke Father     cva  . Heart disease Father     chf  . Heart disease Brother     maybe  . Diabetes Other     several siblings  . Coronary artery disease Brother     cabg  . Alzheimer's disease Sister   . Mental illness Sister     alzheimers  . Cancer Neg Hx      History   Social History  . Marital Status: Legally Separated    Spouse Name: N/A    Number of Children: 5  . Years of Education: N/A   Occupational History  . retired    Social History Main Topics  . Smoking status: Never Smoker   . Smokeless tobacco: Never Used  . Alcohol Use: Yes     Comment: rare  . Drug Use: No  . Sexually Active: Not on file   Other Topics Concern  . Not on file   Social History Narrative   Lives by self, independent on ADL, retired. Separated from wife.    Daughters:   [name] ( lives in [place]) [phone]   Roanna Raider ( lives out of town)  [phone]           BP 145/61  Pulse 60  Ht 5\' 7"  (1.702 m)  Wt 171 lb 6.4 oz (77.747 kg)  BMI 26.84 kg/m2  Physical Exam:  Well appearing elderly man,NAD HEENT: Unremarkable Neck:  6 cm JVD, no thyromegally Lungs:  Clear with no wheezes, rales, or rhonchi. Well-healed pacemaker incision HEART:  Regular rate rhythm, grade 2/6 systolic murmur best heard at the right upper sternal border, no rubs, no clicks Abd:  soft, positive bowel sounds, no organomegally, no rebound, no guarding Ext:  2 plus pulses, no edema, no cyanosis, no clubbing Skin:  No rashes no nodules Neuro:  CN II through XII intact, motor grossly intact  DEVICE  Normal device function.  See PaceArt for details.   Assess/Plan:

## 2012-09-17 NOTE — Patient Instructions (Addendum)
Your physician wants you to follow-up in: 1 year with Dr. Taylor. You will receive a reminder letter in the mail two months in advance. If you don't receive a letter, please call our office to schedule the follow-up appointment.  

## 2012-09-17 NOTE — Assessment & Plan Note (Signed)
His blood pressure is minimally elevated today. He'll continue his current medicines. I've encouraged the patient to reduce his salt intake.

## 2012-09-17 NOTE — Assessment & Plan Note (Signed)
He has chronic atrial fibrillation. He will continue his current medical therapy with warfarin, they have blockers, and digoxin. His ventricular rate appears to be well-controlled.

## 2012-09-18 LAB — PACEMAKER DEVICE OBSERVATION
ATRIAL PACING PM: 0
BRDY-0005RV: 50 {beats}/min
RV LEAD AMPLITUDE: 498 mv
RV LEAD THRESHOLD: 0.75 V

## 2012-09-24 NOTE — Progress Notes (Signed)
  Subjective:    Patient ID: [name], male    DOB: 1926-01-27, 79 y.o.   MRN: [id number]  HPI  Pt cancelled   Review of Systems     Objective:   Physical Exam        Assessment & Plan:

## 2012-10-09 ENCOUNTER — Ambulatory Visit (INDEPENDENT_AMBULATORY_CARE_PROVIDER_SITE_OTHER): Payer: Medicare Other | Admitting: *Deleted

## 2012-10-09 ENCOUNTER — Ambulatory Visit: Payer: Medicare Other | Admitting: Internal Medicine

## 2012-10-09 VITALS — BP 120/76 | HR 63 | Wt 172.0 lb

## 2012-10-09 DIAGNOSIS — I4891 Unspecified atrial fibrillation: Secondary | ICD-10-CM | POA: Diagnosis not present

## 2012-10-09 DIAGNOSIS — Z7901 Long term (current) use of anticoagulants: Secondary | ICD-10-CM | POA: Diagnosis not present

## 2012-10-09 LAB — POCT INR: INR: 3.9

## 2012-10-09 NOTE — Patient Instructions (Addendum)
Today 3.9 Coumadin 3 mg:  one tablet daily, 1.5 tablets W and F (24 mg/week) New dose: Coumadin 3 mg:  one tablet daily (21 mg/week) Next check 2 weeks JP

## 2012-10-13 ENCOUNTER — Ambulatory Visit (INDEPENDENT_AMBULATORY_CARE_PROVIDER_SITE_OTHER): Payer: Medicare Other | Admitting: Internal Medicine

## 2012-10-13 ENCOUNTER — Encounter: Payer: Self-pay | Admitting: Internal Medicine

## 2012-10-13 ENCOUNTER — Other Ambulatory Visit (INDEPENDENT_AMBULATORY_CARE_PROVIDER_SITE_OTHER): Payer: Medicare Other

## 2012-10-13 VITALS — BP 152/60 | HR 64 | Ht 67.0 in | Wt 173.6 lb

## 2012-10-13 DIAGNOSIS — D509 Iron deficiency anemia, unspecified: Secondary | ICD-10-CM | POA: Diagnosis not present

## 2012-10-13 DIAGNOSIS — R195 Other fecal abnormalities: Secondary | ICD-10-CM | POA: Diagnosis not present

## 2012-10-13 DIAGNOSIS — E538 Deficiency of other specified B group vitamins: Secondary | ICD-10-CM

## 2012-10-13 LAB — CBC WITH DIFFERENTIAL/PLATELET
Eosinophils Relative: 2.5 % (ref 0.0–5.0)
Lymphocytes Relative: 16.2 % (ref 12.0–46.0)
Monocytes Absolute: 0.8 10*3/uL (ref 0.1–1.0)
Monocytes Relative: 11.7 % (ref 3.0–12.0)
Neutrophils Relative %: 69.2 % (ref 43.0–77.0)
Platelets: 207 10*3/uL (ref 150.0–400.0)
RBC: 3.86 Mil/uL — ABNORMAL LOW (ref 4.22–5.81)
WBC: 6.5 10*3/uL (ref 4.5–10.5)

## 2012-10-13 MED ORDER — CYANOCOBALAMIN 1000 MCG/ML IJ SOLN
1000.0000 ug | Freq: Once | INTRAMUSCULAR | Status: AC
  Administered 2012-10-13: 1000 ug via INTRAMUSCULAR

## 2012-10-13 MED ORDER — CYANOCOBALAMIN 1000 MCG/ML IJ SOLN: 1000.0000 ug | Freq: Once | INTRAMUSCULAR | Status: DC

## 2012-10-13 NOTE — Patient Instructions (Addendum)
Today you have been given a B-12 injection.   Your physician has requested that you go to the basement for the following lab work before leaving today: CBC/diff  We will contact you about setting up a small bowel capsule endoscopy test at the hospital.  I appreciate the opportunity to care for you.

## 2012-10-13 NOTE — Progress Notes (Signed)
Subjective:    Patient ID: [name], male    DOB: 05-20-1925, 79 y.o.   MRN: [id number]  HPI This very nice elderly man returns after guaiac stool cards returned + for occult blood. He is color-blind and cannot see if he is having bleeding. He is otherwise ok - he is taking oral iron and B!2 oral supplement for deficiencies. No bowel changes. Wt Readings from Last 3 Encounters:  10/13/12 173 lb 9.6 oz (77.744 kg)  10/09/12 172 lb (77.019 kg)  09/17/12 171 lb 6.4 oz (77.747 kg)   Allergies  Allergen Reactions  . Ace Inhibitors     REACTION: cough  . Codeine Phosphate     REACTION: nausea  . Hydrochlorothiazide W-Triamterene     REACTION: cramps   Outpatient Prescriptions Prior to Visit  Medication Sig Dispense Refill  . aspirin 81 MG tablet Take 81 mg by mouth daily.      [name] atenolol (TENORMIN) 50 MG tablet Take 1 tablet (50 mg total) by mouth 2 (two) times daily.  180 tablet  1  . bd getting started take home kit MISC 1 kit by Other route once.  1 kit  0  . Cyanocobalamin (B-12) 1000 MCG CAPS Take 1 capsule by mouth daily.  30 capsule  11  . digoxin (LANOXIN) 0.125 MG tablet Take 1 tablet (0.125 mg total) by mouth daily.  90 tablet  3  . fenofibrate micronized (LOFIBRA) 134 MG capsule Take 134 mg by mouth daily before breakfast.      . ferrous sulfate 325 (65 FE) MG EC tablet Take 1 tablet (325 mg total) by mouth 2 (two) times daily with a meal.  60 tablet  11  . Flexpen Starter Kit MISC 1 kit by Other route once.  1 kit  0  . glucose blood test strip Use as instructed  100 each  12  . glucose monitoring kit (FREESTYLE) monitoring kit 1 each by Does not apply route as needed for other. For TID glucose testing  1 each  0  . insulin aspart (NOVOLOG) 100 UNIT/ML injection CBG 251 - 300: 5 units CBG 301 - 350: 7 units CBG 351 - 400: 9 units      . insulin glargine (LANTUS) 100 units/mL SOLN Inject 8 Units into the skin daily at 10 pm.  15 mL  0  . Insulin Pen Needle 32G X 4 MM MISC  Use as directed.  100 each  6  . Lancets (ONETOUCH ULTRASOFT) lancets Use as instructed  100 each  12  . levothyroxine (SYNTHROID, LEVOTHROID) 88 MCG tablet Take 1 tablet (88 mcg total) by mouth daily.  90 tablet  1  . nitroGLYCERIN (NITROSTAT) 0.4 MG SL tablet Place 0.4 mg under the tongue every 5 (five) minutes as needed. For chest pain      . omeprazole (PRILOSEC) 40 MG capsule Take 40 mg by mouth daily as needed. For acid reflux      . pravastatin (PRAVACHOL) 40 MG tablet Take 80 mg by mouth daily.       [name] warfarin (COUMADIN) 3 MG tablet Take 3-4.5 mg by mouth daily. Take 1.5 tablet on Wed, and Fri, take 1 tablet all other days      . insulin glargine (LANTUS) 100 units/mL SOLN Inject 8 Units into the skin at bedtime.       No facility-administered medications prior to visit.   Past Medical History  Diagnosis Date  . Diabetes mellitus, type 2   .  CAD (coronary artery disease)   . Hypertension   . Hyperlipidemia   . Atrial fibrillation   . AAA (abdominal aortic aneurysm)   . Hypothyroidism   . BPH (benign prostatic hyperplasia)     reports aprocedure (TURP) remotely in [place].Marland KitchenNo futher f/u w/ urology  . GERD (gastroesophageal reflux disease)   . Insomnia     transient  . Vitamin B12 deficiency   . Osteopenia     dexa 2-11  . Diverticulosis     left colon  . Anemia    Past Surgical History  Procedure Laterality Date  . Pacemaker  1980, 1995, 2013    medtronic minix 8341  . Cholecystectomy, laparoscopic    . Prostatectomy      transurethral  . Inguinal herniorrhaphies      bilateral  . Right hip replacement    . Coronary artery bypass graft      1999 stents in 2000  . Penile prosthesis implant  1992  . Transthoracic echocardiogram  12/2006  . Esophagogastroduodenoscopy      multiple  . Colonoscopy      multiple  . Cataract extraction      right  . Cardiac catheterization  01/31/06, 09/25/10, 09-2011   History   Social History  . Marital Status: Legally Separated     Spouse Name: N/A    Number of Children: 5  . Years of Education: N/A   Occupational History  . retired    Social History Main Topics  . Smoking status: Never Smoker   . Smokeless tobacco: Never Used  . Alcohol Use: Yes     Comment: rare  . Drug Use: No  . Sexually Active: None   Other Topics Concern  . None   Social History Narrative   Lives by self, independent on ADL, retired. Separated from wife.    Daughters:   [name] ( lives in [place]) [phone]   [name] ( lives out of town)  [phone]         Family History  Problem Relation Age of Onset  . Alzheimer's disease Mother   . Mental illness Mother     alzheimers  . Heart failure Father   . Stroke Father     cva  . Heart disease Father     chf  . Heart disease Brother     maybe  . Diabetes Other     several siblings  . Coronary artery disease Brother     cabg  . Alzheimer's disease Sister   . Mental illness Sister     alzheimers  . Cancer Neg Hx        Review of Systems As above    Objective:   Physical Exam Elderly NAD  Lab Results  Component Value Date   WBC 5.6 09/08/2012   HGB 9.8* 09/08/2012   HCT 29.4* 09/08/2012   MCV 83.6 09/08/2012   PLT 264.0 09/08/2012       Assessment & Plan:  Heme + stool - Plan: CBC with Differential, cyanocobalamin ((VITAMIN B-12)) injection 1,000 mcg  Iron deficiency anemia - Plan: CBC with Differential, cyanocobalamin ((VITAMIN B-12)) injection 1,000 mcg  B12 deficiency - Plan: CBC with Differential, cyanocobalamin ((VITAMIN B-12)) injection 1,000 mcg, DISCONTINUED: cyanocobalamin ((VITAMIN B-12)) injection 1,000 mcg    1. He has had a recurrent anemia w/ iron deficiency more than once and two EGD/colonoscopy studies failed to reveal a cause. 2. Bowel habits and weight are stable (weight stable since 2013). 3.  Will proceed w/ a capsule endoscopy of small bowel - he has a pacemaker so this will need to be done at the hospital with monitoring.I wonder if he  does not have small bowel AVM's that leak blood (with increased intensity since he is on warfarin) 4. If this test fails to give a cause will consider repeating colonoscopy or EGD 9off warfarin) 5. B! 1000 ug [place] x 1 today (we had some), stay on oral B12 and iron also 6. Labs as above

## 2012-10-14 NOTE — Progress Notes (Signed)
Quick Note:  Hgb much better Waiting to schedule capsule endoscopy at hospital (has pacemaker)  Please let him know and let me know when capsule is scheduled ______

## 2012-10-14 NOTE — Progress Notes (Signed)
The capsule machine is down per [place] in the endo unit at [facility] . They do not know when it will be fixed.

## 2012-10-16 ENCOUNTER — Ambulatory Visit: Payer: Medicare Other | Admitting: Internal Medicine

## 2012-10-16 ENCOUNTER — Telehealth: Payer: Self-pay | Admitting: Internal Medicine

## 2012-10-16 NOTE — Telephone Encounter (Signed)
I will try to get a better idea next week when we might be able to do a capsule endoscopy and we can call him.

## 2012-10-16 NOTE — Telephone Encounter (Signed)
Dr. Leone Payor what is the next step until the hospital capsule endo gets up running?

## 2012-10-21 ENCOUNTER — Other Ambulatory Visit: Payer: Self-pay

## 2012-10-21 ENCOUNTER — Telehealth: Payer: Self-pay

## 2012-10-21 DIAGNOSIS — D509 Iron deficiency anemia, unspecified: Secondary | ICD-10-CM

## 2012-10-21 NOTE — Telephone Encounter (Signed)
Spoke to patient and told him we will be able to set up the capsule endoscopy at [facility] .  I will contact him back once we have a date and time.

## 2012-10-22 ENCOUNTER — Telehealth: Payer: Self-pay

## 2012-10-22 ENCOUNTER — Encounter: Payer: Self-pay | Admitting: Internal Medicine

## 2012-10-22 ENCOUNTER — Other Ambulatory Visit: Payer: Self-pay | Admitting: Internal Medicine

## 2012-10-22 DIAGNOSIS — D509 Iron deficiency anemia, unspecified: Secondary | ICD-10-CM

## 2012-10-22 NOTE — Telephone Encounter (Signed)
Left message for patient to call regarding upcoming capsule appointment:  11/04/12 at 8:00am at [facility], instructions being mailed to patient by [name], [facility].

## 2012-10-22 NOTE — Progress Notes (Signed)
Patient ID: [name], male   DOB: 1925/05/25, 79 y.o.   MRN: [id number] Spoke to Shawn in bed placement regarding obtaining a telemetry bed for [name] after his capsule endoscopy on 11/04/2012 in the ENDO unit.  Shawn said Endo just needs to call bed placement after the study for the bed.

## 2012-10-22 NOTE — Telephone Encounter (Signed)
Informed patient of date/time of capsule study (11/04/12 , 8:00am) at [facility] endo unit.  He will call with any questions once he gets the instructions in the mail.

## 2012-10-23 ENCOUNTER — Ambulatory Visit (INDEPENDENT_AMBULATORY_CARE_PROVIDER_SITE_OTHER): Payer: Medicare Other | Admitting: *Deleted

## 2012-10-23 VITALS — BP 142/70 | HR 62 | Wt 174.0 lb

## 2012-10-23 DIAGNOSIS — I4891 Unspecified atrial fibrillation: Secondary | ICD-10-CM | POA: Diagnosis not present

## 2012-10-23 DIAGNOSIS — Z7901 Long term (current) use of anticoagulants: Secondary | ICD-10-CM

## 2012-10-23 LAB — POCT INR: INR: 3.3

## 2012-10-23 NOTE — Patient Instructions (Signed)
Coumadin 3 mg one tablet daily except on Saturdays, take half tablet (19.5 mg/w)  Next check 3 weeks  JP

## 2012-10-23 NOTE — Progress Notes (Signed)
  Subjective:    Patient ID: [name], male    DOB: Jun 13, 1925, 79 y.o.   MRN: [id number]  HPI INR check, coumadin as decreased from 24 mg to 21 mg weekly. INR today is 3.3.(coumadin 3 mg: one tablet daily) New dose: Coumadin 3 mg one tablet daily except on Saturdays, take half tablet (19.5 mg/w) Next check 3 weeks  JP    Review of Systems     Objective:   Physical Exam        Assessment & Plan:

## 2012-11-03 ENCOUNTER — Telehealth: Payer: Self-pay | Admitting: Internal Medicine

## 2012-11-03 NOTE — Telephone Encounter (Signed)
All questions answered.  He will keep appt for capsule endo tomorrow at 7:30

## 2012-11-04 ENCOUNTER — Observation Stay (HOSPITAL_COMMUNITY)
Admission: RE | Admit: 2012-11-04 | Discharge: 2012-11-04 | Disposition: A | Discharge status: Home / Self Care | Payer: Medicare Other | Source: Ambulatory Visit | Attending: Internal Medicine | Admitting: Internal Medicine

## 2012-11-04 ENCOUNTER — Encounter (HOSPITAL_COMMUNITY)
Admission: RE | Disposition: A | Discharge status: Home / Self Care | Payer: Self-pay | Source: Ambulatory Visit | Attending: Internal Medicine

## 2012-11-04 ENCOUNTER — Encounter (HOSPITAL_COMMUNITY): Payer: Self-pay

## 2012-11-04 DIAGNOSIS — Z7901 Long term (current) use of anticoagulants: Secondary | ICD-10-CM | POA: Insufficient documentation

## 2012-11-04 DIAGNOSIS — R195 Other fecal abnormalities: Secondary | ICD-10-CM | POA: Insufficient documentation

## 2012-11-04 DIAGNOSIS — D509 Iron deficiency anemia, unspecified: Secondary | ICD-10-CM | POA: Diagnosis not present

## 2012-11-04 DIAGNOSIS — K5730 Diverticulosis of large intestine without perforation or abscess without bleeding: Secondary | ICD-10-CM | POA: Insufficient documentation

## 2012-11-04 DIAGNOSIS — K259 Gastric ulcer, unspecified as acute or chronic, without hemorrhage or perforation: Secondary | ICD-10-CM | POA: Insufficient documentation

## 2012-11-04 DIAGNOSIS — Z950 Presence of cardiac pacemaker: Secondary | ICD-10-CM | POA: Diagnosis not present

## 2012-11-04 DIAGNOSIS — D126 Benign neoplasm of colon, unspecified: Secondary | ICD-10-CM | POA: Diagnosis not present

## 2012-11-04 HISTORY — PX: GIVENS CAPSULE STUDY: SHX5432

## 2012-11-04 LAB — GLUCOSE, CAPILLARY
Glucose-Capillary: 191 mg/dL — ABNORMAL HIGH (ref 70–99)
Glucose-Capillary: 238 mg/dL — ABNORMAL HIGH (ref 70–99)
Glucose-Capillary: 229 mg/dL — ABNORMAL HIGH (ref 70–99)

## 2012-11-04 SURGERY — IMAGING PROCEDURE, GI TRACT, INTRALUMINAL, VIA CAPSULE
Anesthesia: LOCAL

## 2012-11-04 MED ORDER — ATENOLOL 50 MG PO TABS
50.0000 mg | ORAL_TABLET | Freq: Every day | ORAL | Status: DC
  Administered 2012-11-04: 50 mg via ORAL
  Filled 2012-11-04: qty 1

## 2012-11-04 MED ORDER — ONDANSETRON HCL 4 MG/2ML IJ SOLN: 4.0000 mg | Freq: Four times a day (QID) | INTRAMUSCULAR | Status: DC | PRN

## 2012-11-04 MED ORDER — INSULIN ASPART 100 UNIT/ML ~~LOC~~ SOLN
0.0000 [IU] | Freq: Three times a day (TID) | SUBCUTANEOUS | Status: DC
  Administered 2012-11-04 (×2): 3 [IU] via SUBCUTANEOUS

## 2012-11-04 MED ORDER — ONDANSETRON HCL 4 MG PO TABS: 4.0000 mg | ORAL_TABLET | Freq: Four times a day (QID) | ORAL | Status: DC | PRN

## 2012-11-04 MED ORDER — DIGOXIN 125 MCG PO TABS
0.1250 mg | ORAL_TABLET | Freq: Every day | ORAL | Status: DC
  Administered 2012-11-04: 0.125 mg via ORAL
  Filled 2012-11-04: qty 1

## 2012-11-04 SURGICAL SUPPLY — 1 items: TOWEL COTTON PACK 4EA (MISCELLANEOUS) ×4 IMPLANT

## 2012-11-04 NOTE — Discharge Summary (Signed)
[facility] Discharge Summary  Name: [name] MRN: [id number] DOB: 1925/10/06 79 y.o. PCP:  [name], MD  Date of Admission: 11/04/2012  7:00 AM Date of Discharge: 11/04/2012 Primary Gastroenterologist: Stan Head, MD Discharging Physician: Stan Head, MD  Discharge Diagnosis: 1. Recurrent iron deficiency anemia. EGD / colonoscopy unrevealing. Patient completed endo capsule study today, results pending.   2. Chronic atrial fibrillation, on chronic coumadin. His pacemaker necessitated telemetry monitoring during capsule study.    Consultations: none  GI Procedures: Given small bowel video capsule  History/Physical Exam:  See Admission H&P  Admission HPI: [name] is a 79 y.o. male recently evaluated by Dr. Leone Payor for iron deficiency anemia and heme positive stools. A workup for anemia two years ago was unrevealing so patient was scheduled for a small bowel video capsule study. Because patient has a pacemaker he is required to be on the heart monitor for the duration of the study. Patient admitted to telemetry bed for observation. Assuming study will be uneventful, patient can be discharged around 7:30pm this evening.   Hospital Course by problem list:  1.  Recurrent i ron deficiency anemia / heme positive stools. EGD and colonoscopies nondiagnostic. Patient undergoing Givens capsule study for further evaluation. He required telemetry monitoring during study because of his pacemaker. Length of stay approximately 12 hours. Capsule study pending at time of discharge.   2.  Chronic atrial fibrillation, on chronic coumadin. No acute issues during this brief stay.   Discharge Vitals:  BP 128/67  Pulse 60  Temp(Src) 98 F (36.7 C) (Oral)  Resp 16  Ht 5\' 7"  (1.702 m)  Wt 168 lb 6.9 oz (76.4 kg)  BMI 26.37 kg/m2  SpO2 96%  Discharge Labs:  Results for orders placed during the hospital encounter of 11/04/12 (from the past 24 hour(s))  GLUCOSE, CAPILLARY      Status: Abnormal   Collection Time    11/04/12  8:09 AM      Result Value Range   Glucose-Capillary 191 (*) 70 - 99 mg/dL  GLUCOSE, CAPILLARY     Status: Abnormal   Collection Time    11/04/12 11:33 AM      Result Value Range   Glucose-Capillary 238 (*) 70 - 99 mg/dL    Disposition and follow-up:   [name] was discharged from [facility] in stable condition.    Follow-up Appointments:  Future Appointments Provider Department Dept Phone   11/12/2012 3:30 PM [facility] Nurse [facility]  [place] [phone]   12/11/2012 2:45 PM Wanda Plump, MD  HealthCare at  [place] [phone]      Discharge Medications:   Medication List    ASK your doctor about these medications       aspirin 81 MG tablet  Take 81 mg by mouth daily.     atenolol 50 MG tablet  Commonly known as:  TENORMIN  Take 1 tablet (50 mg total) by mouth 2 (two) times daily.     B-12 1000 MCG Caps  Take 1 capsule by mouth daily.     bd getting started take home kit Misc  1 kit by Other route once.     digoxin 0.125 MG tablet  Commonly known as:  LANOXIN  Take 1 tablet (0.125 mg total) by mouth daily.     fenofibrate micronized 134 MG capsule  Commonly known as:  LOFIBRA  Take 134 mg by mouth daily before breakfast.     ferrous sulfate  325 (65 FE) MG EC tablet  Take 1 tablet (325 mg total) by mouth 2 (two) times daily with a meal.     glucose blood test strip  Use as instructed     glucose monitoring kit monitoring kit  1 each by Does not apply route as needed for other. For TID glucose testing     insulin aspart 100 UNIT/ML injection  Commonly known as:  novoLOG  CBG 251 - 300: 5 units  CBG 301 - 350: 7 units  CBG 351 - 400: 9 units     insulin glargine 100 units/mL Soln  Commonly known as:  LANTUS  Inject 8 Units into the skin daily at 10 pm.     Insulin Pen Needle 32G X 4 MM Misc  Use as directed.     Insulin Pen Starter Kit Misc  1  kit by Other route once.     levothyroxine 88 MCG tablet  Commonly known as:  SYNTHROID, LEVOTHROID  Take 1 tablet (88 mcg total) by mouth daily.     nitroGLYCERIN 0.4 MG SL tablet  Commonly known as:  NITROSTAT  Place 0.4 mg under the tongue every 5 (five) minutes as needed. For chest pain     omeprazole 40 MG capsule  Commonly known as:  PRILOSEC  Take 40 mg by mouth daily as needed. For acid reflux     onetouch ultrasoft lancets  Use as instructed     pravastatin 40 MG tablet  Commonly known as:  PRAVACHOL  Take 80 mg by mouth daily.     warfarin 3 MG tablet  Commonly known as:  COUMADIN  Take 1.5-3 mg by mouth daily. Take .5 (1.5mg ) tablet on Friday and 1 tablet all the other days        Signed: Willette Cluster 11/04/2012, 4:26 PM

## 2012-11-04 NOTE — H&P (Signed)
Primary Care Physician:  [name], MD Primary Gastroenterologist:    Stan Head, MD  CHIEF COMPLAINT:  anemia, heme positive stools  HPI: [name] is a 79 y.o. male recently evaluated by Dr. Leone Payor for iron deficiency anemia and heme positive stools. A workup for anemia two years ago was unrevealing so patient was scheduled for a small bowel video capsule study.  Because patient has a pacemaker he is required to be on the heart monitor for the duration of the study. Patient admitted to telemetry bed for observation. Assuming study will be uneventful, patient can be discharged around 7:30pm this evening.   Past Medical History  Diagnosis Date  . Diabetes mellitus, type 2   . CAD (coronary artery disease)   . Hypertension   . Hyperlipidemia   . Atrial fibrillation   . AAA (abdominal aortic aneurysm)   . Hypothyroidism   . BPH (benign prostatic hyperplasia)     reports aprocedure (TURP) remotely in [place].Marland KitchenNo futher f/u w/ urology  . GERD (gastroesophageal reflux disease)   . Insomnia     transient  . Vitamin B12 deficiency   . Osteopenia     dexa 2-11  . Diverticulosis     left colon  . Anemia     Past Surgical History  Procedure Laterality Date  . Pacemaker  1980, 1995, 2013    medtronic minix 8341  . Cholecystectomy, laparoscopic    . Prostatectomy      transurethral  . Inguinal herniorrhaphies      bilateral  . Right hip replacement    . Coronary artery bypass graft      1999 stents in 2000  . Penile prosthesis implant  1992  . Transthoracic echocardiogram  12/2006  . Esophagogastroduodenoscopy      multiple  . Colonoscopy      multiple  . Cataract extraction      right  . Cardiac catheterization  01/31/06, 09/25/10, 09-2011  . Givens capsule study N/A 11/04/2012    Procedure: GIVENS CAPSULE STUDY;  Surgeon: Iva Boop, MD;  Location: [facility] ENDOSCOPY;  Service: Endoscopy;  Laterality: N/A;    Prior to Admission medications   Medication Sig Start Date End Date  Taking? Authorizing Provider  aspirin 81 MG tablet Take 81 mg by mouth daily.   Yes Historical Provider, MD  atenolol (TENORMIN) 50 MG tablet Take 1 tablet (50 mg total) by mouth 2 (two) times daily. 08/19/12  Yes Wanda Plump, MD  bd getting started take home kit MISC 1 kit by Other route once. 08/31/12  Yes Joseph Art, DO  Cyanocobalamin (B-12) 1000 MCG CAPS Take 1 capsule by mouth daily. 08/14/12  Yes Iva Boop, MD  digoxin (LANOXIN) 0.125 MG tablet Take 1 tablet (0.125 mg total) by mouth daily. 09/17/12  Yes Marinus Maw, MD  fenofibrate micronized (LOFIBRA) 134 MG capsule Take 134 mg by mouth daily before breakfast.   Yes Historical Provider, MD  ferrous sulfate 325 (65 FE) MG EC tablet Take 1 tablet (325 mg total) by mouth 2 (two) times daily with a meal. 08/14/12  Yes Iva Boop, MD  Flexpen Starter Kit MISC 1 kit by Other route once. 08/31/12  Yes Joseph Art, DO  glucose blood test strip Use as instructed 09/08/12  Yes Wanda Plump, MD  glucose monitoring kit (FREESTYLE) monitoring kit 1 each by Does not apply route as needed for other. For TID glucose testing 08/31/12  Yes Selinda Orion  Vann, DO  insulin aspart (NOVOLOG) 100 UNIT/ML injection CBG 251 - 300: 5 units CBG 301 - 350: 7 units CBG 351 - 400: 9 units 09/01/12  Yes Wanda Plump, MD  insulin glargine (LANTUS) 100 units/mL SOLN Inject 8 Units into the skin daily at 10 pm. 09/15/12  Yes Wanda Plump, MD  Insulin Pen Needle 32G X 4 MM MISC Use as directed. 09/08/12  Yes Wanda Plump, MD  Lancets [facility] ULTRASOFT) lancets Use as instructed 09/08/12  Yes Wanda Plump, MD  levothyroxine (SYNTHROID, LEVOTHROID) 88 MCG tablet Take 1 tablet (88 mcg total) by mouth daily. 08/19/12  Yes Wanda Plump, MD  omeprazole (PRILOSEC) 40 MG capsule Take 40 mg by mouth daily as needed. For acid reflux   Yes Historical Provider, MD  pravastatin (PRAVACHOL) 40 MG tablet Take 80 mg by mouth daily.    Yes Historical Provider, MD  warfarin (COUMADIN) 3 MG tablet Take  1.5-3 mg by mouth daily. Take .5 (1.5mg ) tablet on Friday and 1 tablet all the other days   Yes Historical Provider, MD  nitroGLYCERIN (NITROSTAT) 0.4 MG SL tablet Place 0.4 mg under the tongue every 5 (five) minutes as needed. For chest pain    Historical Provider, MD    Current Facility-Administered Medications  Medication Dose Route Frequency Provider Last Rate Last Dose  . atenolol (TENORMIN) tablet 50 mg  50 mg Oral Daily Amy S Esterwood, PA-C   50 mg at 11/04/12 1132  . digoxin (LANOXIN) tablet 0.125 mg  0.125 mg Oral Daily Amy S Esterwood, PA-C   0.125 mg at 11/04/12 1132  . insulin aspart (novoLOG) injection 0-9 Units  0-9 Units Subcutaneous TID WC Amy S Esterwood, PA-C   3 Units at 11/04/12 1245  . ondansetron (ZOFRAN) tablet 4 mg  4 mg Oral Q6H PRN Amy S Esterwood, PA-C       Or  . ondansetron (ZOFRAN) injection 4 mg  4 mg Intravenous Q6H PRN Amy S Esterwood, PA-C        Allergies as of 10/21/2012 - Review Complete 10/13/2012  Allergen Reaction Noted  . Ace inhibitors  05/22/2006  . Codeine phosphate  05/22/2006  . Hydrochlorothiazide w-triamterene  06/18/2006    Family History  Problem Relation Age of Onset  . Alzheimer's disease Mother   . Mental illness Mother     alzheimers  . Heart failure Father   . Stroke Father     cva  . Heart disease Father     chf  . Heart disease Brother     maybe  . Diabetes Other     several siblings  . Coronary artery disease Brother     cabg  . Alzheimer's disease Sister   . Mental illness Sister     alzheimers  . Cancer Neg Hx     History   Social History  . Marital Status: Legally Separated    Spouse Name: N/A    Number of Children: 5  . Years of Education: N/A   Occupational History  . retired    Social History Main Topics  . Smoking status: Never Smoker   . Smokeless tobacco: Never Used  . Alcohol Use: Yes     Comment: rare  . Drug Use: No  . Sexually Active: No   Other Topics Concern  . Not on file    Social History Narrative   Lives by self, independent on ADL, retired. Separated from wife.    Daughters:  [name] ( lives in [place]) [phone]   Roanna Raider ( lives out of town)  [phone]          Review of Systems:  All systems reviewed an negative except where noted in HPI.  Physical Exam: Vital signs in last 24 hours: Temp:  [97.9 F (36.6 C)-98 F (36.7 C)] 98 F (36.7 C) (06/25 1321) Pulse Rate:  [60] 60 (06/25 1321) Resp:  [11-16] 16 (06/25 1321) BP: (128-165)/(67-83) 128/67 mmHg (06/25 1321) SpO2:  [96 %-100 %] 96 % (06/25 1321) Weight:  [168 lb 6.9 oz (76.4 kg)-173 lb (78.472 kg)] 168 lb 6.9 oz (76.4 kg) (06/25 0918) Last BM Date: 11/03/12 General:  Pleasant, well-developed, white male in NAD Head:  Normocephalic and atraumatic. Eyes:  Sclera clear, no icterus.   Conjunctiva pink. Ears:  Normal auditory acuity. Mouth:  No deformity or lesions.  Neck:  Supple; no masses . Lungs:  Clear throughout to auscultation.   No wheezes, crackles, or rhonchi. No acute distress. Heart:  Regular rate and rhythm Abdomen:  Soft, nondistended, nontender. No masses, hepatomegaly. No obvious masses.  Normal bowel .    Msk:  Symmetrical without gross deformities.. Pulses:  Normal pulses noted. Extremities:  Without edema. Neurologic:  Alert and  oriented x4;  grossly normal neurologically. Skin:  Intact without significant lesions or rashes. Cervical Nodes:  No significant cervical adenopathy. Psych:  Alert and cooperative. Normal mood and affect.  Impression / Plan:   Iron deficiency anemia, recurrent. Unrevealing EGD / colonoscopy. Now getting endo capsule study and needs to be on telemetry during procedure because of pacemaker. Plan is for discharge home at completion of study today.       LOS: 0 days   Willette Cluster  11/04/2012, 4:17 PM

## 2012-11-04 NOTE — Interval H&P Note (Signed)
History and Physical Interval Note:  11/04/2012 11:53 AM  [name]  has presented today for surgery, with the diagnosis of Iron deficiency anemia [280.9]  The various methods of treatment have been discussed with the patient and family. After consideration of risks, benefits and other options for treatment, the patient has consented to  Procedure(s): GIVENS CAPSULE STUDY (N/A) as a surgical intervention .  The patient's history has been reviewed, patient examined, no change in status, stable for surgery.  I have reviewed the patient's chart and labs.  Questions were answered to the patient's satisfaction.     Stan Head

## 2012-11-04 NOTE — H&P (View-Only) (Signed)
Subjective:    Patient ID: [name], male    DOB: 1925/11/08, 79 y.o.   MRN: [id number]  HPI This very nice elderly man returns after guaiac stool cards returned + for occult blood. He is color-blind and cannot see if he is having bleeding. He is otherwise ok - he is taking oral iron and B!2 oral supplement for deficiencies. No bowel changes. Wt Readings from Last 3 Encounters:  10/13/12 173 lb 9.6 oz (78.744 kg)  10/09/12 172 lb (78.019 kg)  09/17/12 171 lb 6.4 oz (77.747 kg)   Allergies  Allergen Reactions  . Ace Inhibitors     REACTION: cough  . Codeine Phosphate     REACTION: nausea  . Hydrochlorothiazide W-Triamterene     REACTION: cramps   Outpatient Prescriptions Prior to Visit  Medication Sig Dispense Refill  . aspirin 81 MG tablet Take 81 mg by mouth daily.      [name] atenolol (TENORMIN) 50 MG tablet Take 1 tablet (50 mg total) by mouth 2 (two) times daily.  180 tablet  1  . bd getting started take home kit MISC 1 kit by Other route once.  1 kit  0  . Cyanocobalamin (B-12) 1000 MCG CAPS Take 1 capsule by mouth daily.  30 capsule  11  . digoxin (LANOXIN) 0.125 MG tablet Take 1 tablet (0.125 mg total) by mouth daily.  90 tablet  3  . fenofibrate micronized (LOFIBRA) 134 MG capsule Take 134 mg by mouth daily before breakfast.      . ferrous sulfate 325 (65 FE) MG EC tablet Take 1 tablet (325 mg total) by mouth 2 (two) times daily with a meal.  60 tablet  11  . Flexpen Starter Kit MISC 1 kit by Other route once.  1 kit  0  . glucose blood test strip Use as instructed  100 each  12  . glucose monitoring kit (FREESTYLE) monitoring kit 1 each by Does not apply route as needed for other. For TID glucose testing  1 each  0  . insulin aspart (NOVOLOG) 100 UNIT/ML injection CBG 251 - 300: 5 units CBG 301 - 350: 7 units CBG 351 - 400: 9 units      . insulin glargine (LANTUS) 100 units/mL SOLN Inject 8 Units into the skin daily at 10 pm.  15 mL  0  . Insulin Pen Needle 32G X 4 MM MISC  Use as directed.  100 each  6  . Lancets (ONETOUCH ULTRASOFT) lancets Use as instructed  100 each  12  . levothyroxine (SYNTHROID, LEVOTHROID) 88 MCG tablet Take 1 tablet (88 mcg total) by mouth daily.  90 tablet  1  . nitroGLYCERIN (NITROSTAT) 0.4 MG SL tablet Place 0.4 mg under the tongue every 5 (five) minutes as needed. For chest pain      . omeprazole (PRILOSEC) 40 MG capsule Take 40 mg by mouth daily as needed. For acid reflux      . pravastatin (PRAVACHOL) 40 MG tablet Take 80 mg by mouth daily.       [name] warfarin (COUMADIN) 3 MG tablet Take 3-4.5 mg by mouth daily. Take 1.5 tablet on Wed, and Fri, take 1 tablet all other days      . insulin glargine (LANTUS) 100 units/mL SOLN Inject 8 Units into the skin at bedtime.       No facility-administered medications prior to visit.   Past Medical History  Diagnosis Date  . Diabetes mellitus, type 2   .  CAD (coronary artery disease)   . Hypertension   . Hyperlipidemia   . Atrial fibrillation   . AAA (abdominal aortic aneurysm)   . Hypothyroidism   . BPH (benign prostatic hyperplasia)     reports aprocedure (TURP) remotely in [facility].Marland KitchenNo futher f/u w/ urology  . GERD (gastroesophageal reflux disease)   . Insomnia     transient  . Vitamin B12 deficiency   . Osteopenia     dexa 2-11  . Diverticulosis     left colon  . Anemia    Past Surgical History  Procedure Laterality Date  . Pacemaker  1980, 1995, 2013    medtronic minix 8341  . Cholecystectomy, laparoscopic    . Prostatectomy      transurethral  . Inguinal herniorrhaphies      bilateral  . Right hip replacement    . Coronary artery bypass graft      1999 stents in 2000  . Penile prosthesis implant  1992  . Transthoracic echocardiogram  12/2006  . Esophagogastroduodenoscopy      multiple  . Colonoscopy      multiple  . Cataract extraction      right  . Cardiac catheterization  01/31/06, 09/25/10, 09-2011   History   Social History  . Marital Status: Legally Separated     Spouse Name: N/A    Number of Children: 5  . Years of Education: N/A   Occupational History  . retired    Social History Main Topics  . Smoking status: Never Smoker   . Smokeless tobacco: Never Used  . Alcohol Use: Yes     Comment: rare  . Drug Use: No  . Sexually Active: None   Other Topics Concern  . None   Social History Narrative   Lives by self, independent on ADL, retired. Separated from wife.    Daughters:   [name] ( lives in [place]) [phone]   Roanna Raider ( lives out of town)  [phone]         Family History  Problem Relation Age of Onset  . Alzheimer's disease Mother   . Mental illness Mother     alzheimers  . Heart failure Father   . Stroke Father     cva  . Heart disease Father     chf  . Heart disease Brother     maybe  . Diabetes Other     several siblings  . Coronary artery disease Brother     cabg  . Alzheimer's disease Sister   . Mental illness Sister     alzheimers  . Cancer Neg Hx        Review of Systems As above    Objective:   Physical Exam Elderly NAD  Lab Results  Component Value Date   WBC 5.6 09/08/2012   HGB 9.8* 09/08/2012   HCT 29.4* 09/08/2012   MCV 83.6 09/08/2012   PLT 264.0 09/08/2012       Assessment & Plan:  Heme + stool - Plan: CBC with Differential, cyanocobalamin ((VITAMIN B-12)) injection 1,000 mcg  Iron deficiency anemia - Plan: CBC with Differential, cyanocobalamin ((VITAMIN B-12)) injection 1,000 mcg  B12 deficiency - Plan: CBC with Differential, cyanocobalamin ((VITAMIN B-12)) injection 1,000 mcg, DISCONTINUED: cyanocobalamin ((VITAMIN B-12)) injection 1,000 mcg    1. He has had a recurrent anemia w/ iron deficiency more than once and two EGD/colonoscopy studies failed to reveal a cause. 2. Bowel habits and weight are stable (weight stable since 2013). 3.  Will proceed w/ a capsule endoscopy of small bowel - he has a pacemaker so this will need to be done at the hospital with monitoring.I wonder if he  does not have small bowel AVM's that leak blood (with increased intensity since he is on warfarin) 4. If this test fails to give a cause will consider repeating colonoscopy or EGD 9off warfarin) 5. B! 1000 ug [place] x 1 today (we had some), stay on oral B12 and iron also 6. Labs as above

## 2012-11-05 ENCOUNTER — Telehealth: Payer: Self-pay | Admitting: Internal Medicine

## 2012-11-05 NOTE — Telephone Encounter (Signed)
Pt called and asked to speak to [facility]. I told pt it was a office policy I would need a detailed message so I could forward the message to [facility]. He then used foul language and said he was "not leaving a message"  Advised [name] of the phone conversation.

## 2012-11-09 ENCOUNTER — Encounter: Payer: Self-pay | Admitting: Internal Medicine

## 2012-11-10 ENCOUNTER — Telehealth: Payer: Self-pay

## 2012-11-10 NOTE — Telephone Encounter (Signed)
Message copied by Annett Fabian on Tue Nov 10, 2012  9:32 AM ------      Message from: Stan Head E      Created: Mon Nov 09, 2012  8:22 AM      Regarding: needs EGD       Capsule shows possible gastric ulcer            Need to schedule an EGD            Ok to do on warfarin            Please see if we can do Monday 7/7 at 8AM            Let me know            Thanks ------

## 2012-11-10 NOTE — Telephone Encounter (Signed)
Patient notified  He is scheduled for EGD 11/17/12 0800 and pre-visit on 11/12/12 2:00

## 2012-11-12 ENCOUNTER — Ambulatory Visit (INDEPENDENT_AMBULATORY_CARE_PROVIDER_SITE_OTHER): Payer: Medicare Other

## 2012-11-12 ENCOUNTER — Ambulatory Visit (AMBULATORY_SURGERY_CENTER): Payer: Medicare Other | Admitting: *Deleted

## 2012-11-12 VITALS — Ht 67.0 in | Wt 171.2 lb

## 2012-11-12 VITALS — BP 128/72 | HR 60 | Temp 98.5°F | Wt 171.8 lb

## 2012-11-12 DIAGNOSIS — I4891 Unspecified atrial fibrillation: Secondary | ICD-10-CM

## 2012-11-12 DIAGNOSIS — Z7901 Long term (current) use of anticoagulants: Secondary | ICD-10-CM

## 2012-11-12 DIAGNOSIS — R195 Other fecal abnormalities: Secondary | ICD-10-CM

## 2012-11-12 DIAGNOSIS — I2510 Atherosclerotic heart disease of native coronary artery without angina pectoris: Secondary | ICD-10-CM

## 2012-11-12 NOTE — Progress Notes (Signed)
Denies allergies to eggs or soy products. Denies complications with sedation or anesthesia. 

## 2012-11-16 ENCOUNTER — Other Ambulatory Visit (INDEPENDENT_AMBULATORY_CARE_PROVIDER_SITE_OTHER): Payer: Medicare Other

## 2012-11-16 ENCOUNTER — Ambulatory Visit (AMBULATORY_SURGERY_CENTER): Payer: Medicare Other | Admitting: Internal Medicine

## 2012-11-16 ENCOUNTER — Encounter: Payer: Self-pay | Admitting: Internal Medicine

## 2012-11-16 VITALS — BP 138/79 | HR 60 | Temp 97.0°F | Resp 0 | Ht 67.0 in | Wt 171.0 lb

## 2012-11-16 DIAGNOSIS — R195 Other fecal abnormalities: Secondary | ICD-10-CM

## 2012-11-16 DIAGNOSIS — K2970 Gastritis, unspecified, without bleeding: Secondary | ICD-10-CM | POA: Diagnosis not present

## 2012-11-16 DIAGNOSIS — K449 Diaphragmatic hernia without obstruction or gangrene: Secondary | ICD-10-CM

## 2012-11-16 DIAGNOSIS — I4891 Unspecified atrial fibrillation: Secondary | ICD-10-CM | POA: Diagnosis not present

## 2012-11-16 DIAGNOSIS — K2990 Gastroduodenitis, unspecified, without bleeding: Secondary | ICD-10-CM

## 2012-11-16 DIAGNOSIS — K2960 Other gastritis without bleeding: Secondary | ICD-10-CM | POA: Diagnosis not present

## 2012-11-16 DIAGNOSIS — E119 Type 2 diabetes mellitus without complications: Secondary | ICD-10-CM | POA: Diagnosis not present

## 2012-11-16 DIAGNOSIS — R933 Abnormal findings on diagnostic imaging of other parts of digestive tract: Secondary | ICD-10-CM

## 2012-11-16 LAB — CBC WITH DIFFERENTIAL/PLATELET
Basophils Absolute: 0 10*3/uL (ref 0.0–0.1)
Basophils Relative: 0.5 % (ref 0.0–3.0)
Eosinophils Absolute: 0.1 10*3/uL (ref 0.0–0.7)
Lymphocytes Relative: 16.9 % (ref 12.0–46.0)
MCHC: 33.9 g/dL (ref 30.0–36.0)
Neutrophils Relative %: 68 % (ref 43.0–77.0)
Platelets: 176 10*3/uL (ref 150.0–400.0)
RBC: 4.05 Mil/uL — ABNORMAL LOW (ref 4.22–5.81)
RDW: 15.8 % — ABNORMAL HIGH (ref 11.5–14.6)

## 2012-11-16 LAB — GLUCOSE, CAPILLARY
Glucose-Capillary: 218 mg/dL — ABNORMAL HIGH (ref 70–99)
Glucose-Capillary: 187 mg/dL — ABNORMAL HIGH (ref 70–99)

## 2012-11-16 MED ORDER — SODIUM CHLORIDE 0.9 % IV SOLN: 500.0000 mL | INTRAVENOUS | Status: DC

## 2012-11-16 MED ORDER — OMEPRAZOLE 40 MG PO CPDR: 40.0000 mg | DELAYED_RELEASE_CAPSULE | Freq: Every day | ORAL | Status: DC

## 2012-11-16 NOTE — Patient Instructions (Addendum)
There were tiny ulcers in the stomach - we call them erosions. You also have a hiatal hernia.There are some red areas where the stomach lining gets squeezed with the hernia - but no ulcers. I think you could be leaking blood from these two abnormalities. No signs of cancer - I took biopsies of the erosions and will let you know.  Please be sure to take omeprazole every day - even if you feel ok - it can heal the inflammation and reduce blood loss.  We will recheck blood count today also - you will stop in the basement.  My office will call with results and recommendations.  I appreciate the opportunity to care for you. Iva Boop, MD, [facility]  Discharge instructions given with verbal understanding. Handouts on gastritis and a hiatal hernia. Resume previous medications. YOU HAD AN ENDOSCOPIC PROCEDURE TODAY AT THE [facility]: Refer to the procedure report that was given to you for any specific questions about what was found during the examination.  If the procedure report does not answer your questions, please call your gastroenterologist to clarify.  If you requested that your care partner not be given the details of your procedure findings, then the procedure report has been included in a sealed envelope for you to review at your convenience later.  YOU SHOULD EXPECT: Some feelings of bloating in the abdomen. Passage of more gas than usual.  Walking can help get rid of the air that was put into your GI tract during the procedure and reduce the bloating. If you had a lower endoscopy (such as a colonoscopy or flexible sigmoidoscopy) you may notice spotting of blood in your stool or on the toilet paper. If you underwent a bowel prep for your procedure, then you may not have a normal bowel movement for a few days.  DIET: Your first meal following the procedure should be a light meal and then it is ok to progress to your normal diet.  A half-sandwich or bowl of soup is an example of a  good first meal.  Heavy or fried foods are harder to digest and may make you feel nauseous or bloated.  Likewise meals heavy in dairy and vegetables can cause extra gas to form and this can also increase the bloating.  Drink plenty of fluids but you should avoid alcoholic beverages for 24 hours.  ACTIVITY: Your care partner should take you home directly after the procedure.  You should plan to take it easy, moving slowly for the rest of the day.  You can resume normal activity the day after the procedure however you should NOT DRIVE or use heavy machinery for 24 hours (because of the sedation medicines used during the test).    SYMPTOMS TO REPORT IMMEDIATELY: A gastroenterologist can be reached at any hour.  During normal business hours, 8:30 AM to 5:00 PM Monday through Friday, call [phone].  After hours and on weekends, please call the GI answering service at [phone] who will take a message and have the physician on call contact you.   Following upper endoscopy (EGD)  Vomiting of blood or coffee ground material  New chest pain or pain under the shoulder blades  Painful or persistently difficult swallowing  New shortness of breath  Fever of 100F or higher  Black, tarry-looking stools  FOLLOW UP: If any biopsies were taken you will be contacted by phone or by letter within the next 1-3 weeks.  Call your gastroenterologist if you  have not heard about the biopsies in 3 weeks.  Our staff will call the home number listed on your records the next business day following your procedure to check on you and address any questions or concerns that you may have at that time regarding the information given to you following your procedure. This is a courtesy call and so if there is no answer at the home number and we have not heard from you through the emergency physician on call, we will assume that you have returned to your regular daily activities without  incident.  SIGNATURES/CONFIDENTIALITY: You and/or your care partner have signed paperwork which will be entered into your electronic medical record.  These signatures attest to the fact that that the information above on your After Visit Summary has been reviewed and is understood.  Full responsibility of the confidentiality of this discharge information lies with you and/or your care-partner.

## 2012-11-16 NOTE — Progress Notes (Signed)
Called to room to assist during endoscopic procedure.  Patient ID and intended procedure confirmed with present staff. Received instructions for my participation in the procedure from the performing physician.  

## 2012-11-16 NOTE — Progress Notes (Signed)
Patient did not experience any of the following events: a burn prior to discharge; a fall within the facility; wrong site/side/patient/procedure/implant event; or a hospital transfer or hospital admission upon discharge from the facility. (G8907) Patient did not have preoperative order for IV antibiotic SSI prophylaxis. (G8918)  

## 2012-11-16 NOTE — Op Note (Signed)
[facility] [address] [place], [address]   ENDOSCOPY PROCEDURE REPORT  PATIENT: [name], [name]  MR#: [id number] BIRTHDATE: 26-Feb-1926 , 86  yrs. old GENDER: Male ENDOSCOPIST: Iva Boop, MD, [facility] PROCEDURE DATE:  11/16/2012 PROCEDURE:  EGD w/ biopsy ASA CLASS:     Class III INDICATIONS:  Anemia secondary to chronic blood loss, heme + anemia, iron-deficient. gastric ulcer seen at capsule endoscopy MEDICATIONS: propofol (Diprivan) 150mg  IV, MAC sedation, administered by CRNA, and These medications were titrated to patient response per physician's verbal order TOPICAL ANESTHETIC: none  DESCRIPTION OF PROCEDURE: After the risks benefits and alternatives of the procedure were thoroughly explained, informed consent was obtained.  The LB [id number] endoscope was introduced through the mouth and advanced to the second portion of the duodenum. Without limitations.  The instrument was slowly withdrawn as the mucosa was fully examined.        STOMACH: A 7 cm (33-40 cm)hiatal hernia was noted. There were erthematous areas at 40 cm in gastric body where diaphragm impinges.  Moderate erosive gastritis (inflammation) with hemorrhage was found in the gastric antrum.  There were several linear erosions present.  Multiple biopsies were performed using cold forceps.  Sample sent for histology.  The remainder of the upper endoscopy exam was otherwise normal. Retroflexed views revealed a hiatal hernia.     The scope was then withdrawn from the patient and the procedure completed.  COMPLICATIONS: There were no complications. ENDOSCOPIC IMPRESSION: 1.   7 cm hiatal hernia with red/erythematous changes in gastric body where diaphragm impinges - not erosions but ? similar to Cameron's erosions 2.   Erosive gastritis (inflammation) with hemorrhage was found in the gastric antrum; multiple biopsies 3.   The remainder of the upper endoscopy exam was otherwise  normal  RECOMMENDATIONS: 1.  Continue PPI but take omeoprazole every day. 2.   CBC today 3.  Office will call with results/plans - these lesions can explain chronic blood loss, especially on warfarin and ASA   eSigned:  Iva Boop, MD, [facility] 11/16/2012 8:30 AM   CC:The Patient, [name], MD, and Marinus Maw, MD

## 2012-11-16 NOTE — Progress Notes (Signed)
Pt denies any egg or soy allergy.

## 2012-11-17 ENCOUNTER — Telehealth: Payer: Self-pay | Admitting: *Deleted

## 2012-11-17 NOTE — Telephone Encounter (Signed)
  Follow up Call-  Call back number 11/16/2012  Post procedure Call Back phone  # [phone]  Permission to leave phone message No     Patient questions:  Do you have a fever, pain , or abdominal swelling? no Pain Score  0 *  Have you tolerated food without any problems? yes  Have you been able to return to your normal activities? yes  Do you have any questions about your discharge instructions: Diet   no Medications  no Follow up visit  no  Do you have questions or concerns about your Care? no  Actions: * If pain score is 4 or above: No action needed, pain <4.

## 2012-11-20 ENCOUNTER — Other Ambulatory Visit: Payer: Self-pay

## 2012-11-20 DIAGNOSIS — D649 Anemia, unspecified: Secondary | ICD-10-CM

## 2012-11-20 NOTE — Progress Notes (Signed)
Quick Note:  Biopsies show inflammation - not a surprise hgb is much better  Have him stay on daily PPI and iron and see me in 2 months - CBC 1 week before  I am ccing Dr. Drue Novel ______

## 2012-12-08 ENCOUNTER — Other Ambulatory Visit: Payer: Self-pay | Admitting: Internal Medicine

## 2012-12-08 ENCOUNTER — Ambulatory Visit: Payer: Self-pay | Admitting: Internal Medicine

## 2012-12-08 ENCOUNTER — Ambulatory Visit (INDEPENDENT_AMBULATORY_CARE_PROVIDER_SITE_OTHER): Payer: Medicare Other

## 2012-12-08 VITALS — BP 140/68 | HR 63 | Temp 98.3°F | Wt 175.0 lb

## 2012-12-08 DIAGNOSIS — Z9861 Coronary angioplasty status: Secondary | ICD-10-CM

## 2012-12-08 DIAGNOSIS — I4891 Unspecified atrial fibrillation: Secondary | ICD-10-CM

## 2012-12-08 NOTE — Telephone Encounter (Signed)
Advised patient of Dr. Drue Novel coumadin directions. Patient states understanding.

## 2012-12-08 NOTE — Telephone Encounter (Signed)
RF only 30 tablets

## 2012-12-08 NOTE — Telephone Encounter (Signed)
Ok to refill?  Last OV 5.6.14 Last filled by a Historical Provider. INR check today at our office 7.29.14 Future appointment 8.1.14

## 2012-12-08 NOTE — Patient Instructions (Addendum)
Continue current therapy until Dr Drue Novel reviews. If there are changes we will call new dosing instructions.   Thank you for coming in today.  ----- Patient taking 3 mg daily  INR today  3 point, slightly higher than usual. Phyllicia Dudek, Patient, advised him to continue taking the same dose. Come back for an INR check in 2 weeks from today. Please document in this note you called  the patient. JP

## 2012-12-09 NOTE — Telephone Encounter (Signed)
Refill done per orders.  

## 2012-12-11 ENCOUNTER — Other Ambulatory Visit: Payer: Self-pay | Admitting: Internal Medicine

## 2012-12-11 ENCOUNTER — Telehealth: Payer: Self-pay

## 2012-12-11 ENCOUNTER — Encounter: Payer: Self-pay | Admitting: Internal Medicine

## 2012-12-11 ENCOUNTER — Ambulatory Visit (INDEPENDENT_AMBULATORY_CARE_PROVIDER_SITE_OTHER): Payer: Medicare Other | Admitting: Internal Medicine

## 2012-12-11 VITALS — BP 140/70 | HR 61 | Temp 98.3°F | Wt 172.0 lb

## 2012-12-11 DIAGNOSIS — E119 Type 2 diabetes mellitus without complications: Secondary | ICD-10-CM

## 2012-12-11 DIAGNOSIS — E538 Deficiency of other specified B group vitamins: Secondary | ICD-10-CM | POA: Diagnosis not present

## 2012-12-11 DIAGNOSIS — D509 Iron deficiency anemia, unspecified: Secondary | ICD-10-CM | POA: Diagnosis not present

## 2012-12-11 DIAGNOSIS — I4891 Unspecified atrial fibrillation: Secondary | ICD-10-CM

## 2012-12-11 DIAGNOSIS — I10 Essential (primary) hypertension: Secondary | ICD-10-CM | POA: Diagnosis not present

## 2012-12-11 LAB — CBC WITH DIFFERENTIAL/PLATELET
Basophils Relative: 1 % (ref 0–1)
Eosinophils Absolute: 0.1 10*3/uL (ref 0.0–0.7)
Eosinophils Relative: 2 % (ref 0–5)
MCH: 32.5 pg (ref 26.0–34.0)
MCHC: 34.5 g/dL (ref 30.0–36.0)
MCV: 94.3 fL (ref 78.0–100.0)
Neutrophils Relative %: 68 % (ref 43–77)
Platelets: 197 10*3/uL (ref 150–400)
RDW: 15 % (ref 11.5–15.5)

## 2012-12-11 NOTE — Assessment & Plan Note (Signed)
On Coumadin 

## 2012-12-11 NOTE — Progress Notes (Signed)
Subjective:    Patient ID: [name], male    DOB: [birth year], 79 y.o.   MRN: [id number]  HPI Follow up Anemia, status post GI workup, see Assessment and plan, Reports good compliance with iron supplements. History of CAD, asymptomatic, has not needed any nitroglycerin. Diabetes, good compliance with Lantus, CBG around the 230s, not using novolog (SS indicates to  use if more than 250) B12 deficiency, reports good compliance with B12 by mouth.  Past Medical History  Diagnosis Date  . Diabetes mellitus, type 2   . CAD (coronary artery disease)   . Hypertension   . Hyperlipidemia   . Atrial fibrillation   . AAA (abdominal aortic aneurysm)   . Hypothyroidism   . BPH (benign prostatic hyperplasia)     reports aprocedure (TURP) remotely in [facility].Marland KitchenNo futher f/u w/ urology  . GERD (gastroesophageal reflux disease)   . Insomnia     transient  . Vitamin B12 deficiency   . Osteopenia     dexa 2-11  . Diverticulosis     left colon  . Anemia    Past Surgical History  Procedure Laterality Date  . Pacemaker  1980, 1995, 2013    medtronic minix 8341  . Cholecystectomy, laparoscopic    . Prostatectomy      transurethral  . Inguinal herniorrhaphies      bilateral  . Right hip replacement    . Coronary artery bypass graft      1999 stents in 2000  . Penile prosthesis implant  1992  . Transthoracic echocardiogram  12/2006  . Esophagogastroduodenoscopy      multiple  . Colonoscopy      multiple  . Cataract extraction      right  . Cardiac catheterization  01/31/06, 09/25/10, 09-2011  . Givens capsule study N/A 11/04/2012    Procedure: GIVENS CAPSULE STUDY;  Surgeon: Iva Boop, MD;  Location: [facility];  Service: Endoscopy;  Laterality: N/A;   History   Social History  . Marital Status: Legally Separated    Spouse Name: N/A    Number of Children: 5  . Years of Education: N/A   Occupational History  . retired    Social History Main Topics  . Smoking status: Never  Smoker   . Smokeless tobacco: Never Used  . Alcohol Use: .6 - 1.2 oz/week    1-2 Glasses of wine per week  . Drug Use: No  . Sexually Active: No   Other Topics Concern  . Not on file   Social History Narrative   Lives by self, independent on ADL, retired. Separated from wife.    Daughters:   [name] ( lives in [place]) [phone]   Roanna Raider ( lives out of town)  [phone]           Review of Systems Denies chest pain or shortness or breath No nausea, vomiting, diarrhea. No abdominal pain. At some point his stools have been dark, he thinks related to iron intake.    Objective:   Physical Exam BP 140/70  Pulse 61  Temp(Src) 98.3 F (36.8 C) (Oral)  Wt 172 lb (78.019 kg)  BMI 26.93 kg/m2  SpO2 96%  General -- alert, well-developed, BMI .    Lungs -- normal respiratory effort, no intercostal retractions, no accessory muscle use, and normal breath sounds.   Heart--  Seems regular today Abdomen--soft, non-tender, no distention, no masses   Extremities-- no pretibial edema bilaterally  Neurologic-- alert & oriented X3 and  strength normal in all extremities. Psych-- Cognition and judgment appear intact. Alert and cooperative with normal attention span and concentration.  not anxious appearing and not depressed appearing.        Assessment & Plan:

## 2012-12-11 NOTE — Patient Instructions (Addendum)
Lantus 14 units every day NovoLog per sliding scale as needed Please call with your blood sugar readings in 2 weeks Next visit in 3 months

## 2012-12-11 NOTE — Assessment & Plan Note (Signed)
Controlled  No change

## 2012-12-11 NOTE — Telephone Encounter (Signed)
Called and advised of Dr. Drue Novel recommendations.

## 2012-12-11 NOTE — Assessment & Plan Note (Signed)
Good compliance with Lantus 10 units, CBGs on average 240, has not used SS Plan: Check A1c Increase Lantus to 14 units Call w/ CBGs in 2 weeks

## 2012-12-11 NOTE — Assessment & Plan Note (Signed)
On oral  supplements, no parenteral B12. Labs

## 2012-12-11 NOTE — Assessment & Plan Note (Addendum)
Status post GI eval: Had a capsule endoscopy 10-2012 and a  EGD 11/2012, EGD showed gastric erosions that may account for the anemia in the setting of taking aspirin and Coumadin. Plan: Labs

## 2012-12-12 LAB — FOLATE: Folate: 14 ng/mL

## 2012-12-12 LAB — VITAMIN B12: Vitamin B-12: 790 pg/mL (ref 211–911)

## 2012-12-14 LAB — HEMOGLOBIN A1C: Mean Plasma Glucose: 226 mg/dL — ABNORMAL HIGH (ref <117)

## 2012-12-16 ENCOUNTER — Telehealth: Payer: Self-pay | Admitting: *Deleted

## 2012-12-16 DIAGNOSIS — M5137 Other intervertebral disc degeneration, lumbosacral region: Secondary | ICD-10-CM | POA: Diagnosis not present

## 2012-12-16 NOTE — Telephone Encounter (Signed)
Message copied by Shirlee More I on Wed Dec 16, 2012 10:46 AM ------      Message from: [name] E      Created: Tue Dec 15, 2012  5:32 PM       Please call patient, labs very good except for diabetes, needs better control.      Currently taking Lantus 14 units, increase Lantus to 18 units.      Call with blood sugar readings in 2 weeks from now.       ------

## 2012-12-16 NOTE — Telephone Encounter (Signed)
Discussed with patient, medication list updated with new lantus dosing. Pt. Verbalized understanding and performed repeat verbalization of instructions to RN.

## 2012-12-22 ENCOUNTER — Ambulatory Visit: Payer: Medicare Other

## 2012-12-25 ENCOUNTER — Telehealth: Payer: Self-pay | Admitting: Internal Medicine

## 2012-12-25 NOTE — Telephone Encounter (Addendum)
CBGs improving. Increase Lantus from 20 units to 25 units, call with CBGs in 2 weeks.

## 2012-12-25 NOTE — Telephone Encounter (Signed)
Discussed with patient-verbalized understanding.  

## 2012-12-25 NOTE — Telephone Encounter (Signed)
You are correct, increase from 18 to 23 units

## 2012-12-25 NOTE — Telephone Encounter (Signed)
according to medication list, pt currently taking 18 units Lantus at bedtime. Please advise.

## 2012-12-25 NOTE — Telephone Encounter (Signed)
Patient states that he was supposed to call in with blood sugar readings. He says that his BS is doing a lot better. The average for the last 7 days is 197 and the average for the last 14 days 213.

## 2012-12-25 NOTE — Telephone Encounter (Signed)
See addendum noted from Dr. Drue Novel regarding insulin dose changes. These changes were not initiated by Rn but by MD. Documentation can be found in addendum revision history.

## 2012-12-28 ENCOUNTER — Other Ambulatory Visit: Payer: Self-pay | Admitting: Internal Medicine

## 2012-12-29 ENCOUNTER — Telehealth: Payer: Self-pay | Admitting: Internal Medicine

## 2012-12-29 ENCOUNTER — Ambulatory Visit (INDEPENDENT_AMBULATORY_CARE_PROVIDER_SITE_OTHER): Payer: Medicare Other

## 2012-12-29 VITALS — BP 130/70 | HR 60 | Temp 97.8°F | Wt 171.2 lb

## 2012-12-29 DIAGNOSIS — I4891 Unspecified atrial fibrillation: Secondary | ICD-10-CM

## 2012-12-29 LAB — POCT INR: INR: 2.1

## 2012-12-29 NOTE — Patient Instructions (Addendum)
Take 1 (3mg ) tablet daily. Recheck in 4 weeks unless otherwise.  Enjoy the rest of your day!!

## 2012-12-29 NOTE — Telephone Encounter (Signed)
error 

## 2012-12-29 NOTE — Telephone Encounter (Signed)
During anti-coag visit today, pt. Requested to please have 90 day supply of Coumadin for cost-effectiveness. Dr. Drue Novel made aware of pt. Request and verbal orders ok to send 90 day supply per pt. Request. Pt. Strongly encouraged to keep all scheduled anti-coag visits and follow up as reccommended. Pt. Verbalized understanding and has had good record of keeping anti-coag visits.

## 2013-01-08 ENCOUNTER — Telehealth: Payer: Self-pay | Admitting: Internal Medicine

## 2013-01-08 ENCOUNTER — Telehealth: Payer: Self-pay | Admitting: *Deleted

## 2013-01-08 NOTE — Telephone Encounter (Signed)
Pt notified via telephone to increase lantus by 5 units and to bring blood sugar readings to next coumadin check. Watch for low sugars. DJR

## 2013-01-08 NOTE — Telephone Encounter (Signed)
Patient is calling to report is 2 week blood sugar averages. His 7-day avg for this week is 174 and last week was 179.

## 2013-01-08 NOTE — Telephone Encounter (Signed)
erorr

## 2013-01-08 NOTE — Telephone Encounter (Addendum)
Increase lantus by 5 units, bring blood sugar reading at time of next coumadin check. Watch for low sugars.

## 2013-01-21 ENCOUNTER — Other Ambulatory Visit (INDEPENDENT_AMBULATORY_CARE_PROVIDER_SITE_OTHER): Payer: Medicare Other

## 2013-01-21 DIAGNOSIS — D649 Anemia, unspecified: Secondary | ICD-10-CM | POA: Diagnosis not present

## 2013-01-21 LAB — CBC WITH DIFFERENTIAL/PLATELET
Basophils Absolute: 0 10*3/uL (ref 0.0–0.1)
Eosinophils Relative: 4.7 % (ref 0.0–5.0)
HCT: 36.7 % — ABNORMAL LOW (ref 39.0–52.0)
Lymphocytes Relative: 18.7 % (ref 12.0–46.0)
Monocytes Relative: 10.8 % (ref 3.0–12.0)
Neutrophils Relative %: 65.3 % (ref 43.0–77.0)
Platelets: 195 10*3/uL (ref 150.0–400.0)
RDW: 14.8 % — ABNORMAL HIGH (ref 11.5–14.6)
WBC: 5.8 10*3/uL (ref 4.5–10.5)

## 2013-01-22 ENCOUNTER — Telehealth: Payer: Self-pay | Admitting: Internal Medicine

## 2013-01-22 NOTE — Telephone Encounter (Signed)
Patient calling with his glucose readings. Last week average was 186 This week average was 192 Please advise patient.

## 2013-01-22 NOTE — Telephone Encounter (Signed)
Please see attached readings: 8/1 average 240 increased to 14u 8/6 increased to 18u 8/15 average 205 increased to 23u  8/29 average 176 increased to 28u 9/12 average 189 increase? Please advise.

## 2013-01-22 NOTE — Telephone Encounter (Signed)
Okay to increase to 32 units as long as he's not having low blood sugar symptoms

## 2013-01-28 ENCOUNTER — Encounter: Payer: Self-pay | Admitting: Internal Medicine

## 2013-01-28 ENCOUNTER — Ambulatory Visit (INDEPENDENT_AMBULATORY_CARE_PROVIDER_SITE_OTHER): Payer: Medicare Other | Admitting: Internal Medicine

## 2013-01-28 VITALS — BP 138/76 | HR 60 | Ht 67.0 in | Wt 175.0 lb

## 2013-01-28 DIAGNOSIS — K449 Diaphragmatic hernia without obstruction or gangrene: Secondary | ICD-10-CM

## 2013-01-28 DIAGNOSIS — Z7901 Long term (current) use of anticoagulants: Secondary | ICD-10-CM

## 2013-01-28 DIAGNOSIS — D500 Iron deficiency anemia secondary to blood loss (chronic): Secondary | ICD-10-CM

## 2013-01-28 DIAGNOSIS — K2960 Other gastritis without bleeding: Secondary | ICD-10-CM | POA: Diagnosis not present

## 2013-01-28 NOTE — Telephone Encounter (Signed)
LVM for patient advising per written order. Advised to let us know if the blood sugars are running low.

## 2013-01-28 NOTE — Patient Instructions (Signed)
Your physician has requested that you go to the basement on March 01, 2013 for the following lab work: Lab is open Monday-Friday 7:30 am to 5:30 pm  CBC  Ferritin   Please follow up as needed

## 2013-01-28 NOTE — Progress Notes (Signed)
  Subjective:    Patient ID: [name], male    DOB: 09-24-1925, 79 y.o.   MRN: [id number]  HPI The patient is here for follow-up of chronic blood loss anemia. He has erosive gastritis and a large hiatal hernia. He also takes warfarin. Capsule endoscopy in June showed gastric ulcer - EGD showed erosive gastritis with large hiatal hernia but no overt Cameron's erosions. He has been treated with iron and regular PPI (was not taking daily before) and Hgb normalized though 9/4 it fell to 12.7. Nos signs of bleeding. He has had two bouts of explosive gas each over 24 hrs, the first after eating a can of baked beans. Otherwise ok.  Medications, allergies, past medical history, past surgical history, family history and social history are reviewed and updated in the EMR.  Review of Systems As above    Objective:   Physical Exam WDWN NAD    Assessment & Plan:  Anemia due to chronic blood loss - Plan: Ferritin, CBC with Differential next month  Hiatal hernia   Erosive gastritis -stay on PPI

## 2013-01-28 NOTE — Assessment & Plan Note (Signed)
Large - may have some undetected Cameron's erosions contributing to chronic blood loss

## 2013-01-28 NOTE — Assessment & Plan Note (Signed)
Improved Cbc and ferritin 1 month Stay on ferrous sulfate Likely transition to pcp f/u of CBC after that

## 2013-01-29 DIAGNOSIS — H4011X0 Primary open-angle glaucoma, stage unspecified: Secondary | ICD-10-CM | POA: Diagnosis not present

## 2013-01-29 DIAGNOSIS — H409 Unspecified glaucoma: Secondary | ICD-10-CM | POA: Diagnosis not present

## 2013-01-31 ENCOUNTER — Telehealth: Payer: Self-pay | Admitting: Internal Medicine

## 2013-01-31 NOTE — Telephone Encounter (Signed)
Due for an INR check, please arrange 

## 2013-02-05 ENCOUNTER — Telehealth: Payer: Self-pay | Admitting: *Deleted

## 2013-02-05 ENCOUNTER — Ambulatory Visit (INDEPENDENT_AMBULATORY_CARE_PROVIDER_SITE_OTHER): Payer: Medicare Other | Admitting: *Deleted

## 2013-02-05 VITALS — BP 120/62 | HR 64 | Temp 98.1°F | Wt 176.0 lb

## 2013-02-05 DIAGNOSIS — Z951 Presence of aortocoronary bypass graft: Secondary | ICD-10-CM

## 2013-02-05 DIAGNOSIS — I4891 Unspecified atrial fibrillation: Secondary | ICD-10-CM | POA: Diagnosis not present

## 2013-02-05 DIAGNOSIS — Z7901 Long term (current) use of anticoagulants: Secondary | ICD-10-CM | POA: Diagnosis not present

## 2013-02-05 LAB — POCT INR: INR: 2.8

## 2013-02-05 NOTE — Telephone Encounter (Signed)
Samples of Lantus given to pt Lot # [id number] Exp 02/2015 Lot # [id number] Exp 04/2015

## 2013-02-05 NOTE — Telephone Encounter (Signed)
appt made

## 2013-02-05 NOTE — Telephone Encounter (Signed)
Attempted to contact pt regarding overdue INR check, left message on voice mail to return call and schedule appt.

## 2013-02-05 NOTE — Patient Instructions (Signed)
Continue with 3mg  daily and recheck INR in 4 weeks.

## 2013-02-05 NOTE — Telephone Encounter (Signed)
Spoke with pt, appt made for today at 3:30 for PT check

## 2013-02-05 NOTE — Telephone Encounter (Signed)
Pt not called yet, will send message to [facility]

## 2013-02-05 NOTE — Telephone Encounter (Signed)
PLEASE ADVISE.

## 2013-02-12 ENCOUNTER — Telehealth: Payer: Self-pay | Admitting: Internal Medicine

## 2013-02-12 NOTE — Telephone Encounter (Signed)
Patient is calling to report his blood sugar average for last week: 216   Blood sugar average this week: 210

## 2013-02-15 NOTE — Telephone Encounter (Signed)
Pt notified via tele. DJR  

## 2013-02-15 NOTE — Telephone Encounter (Signed)
Needs better control, according to the chart he uses  Lantus 32 units daily ----> verify this dose w/ patient. Increase Lantus to 40 units daily. Call w/ readings in one week.

## 2013-02-19 ENCOUNTER — Telehealth: Payer: Self-pay | Admitting: Internal Medicine

## 2013-02-19 NOTE — Telephone Encounter (Signed)
Patient saw an advertisement on TV for cologuard and that it has been approved by Medicare up to age 77.  DNA test for colon cancer screening.  He is interested in having this done.  Please advise if ok to send off for the kit.

## 2013-02-19 NOTE — Telephone Encounter (Signed)
He had a colonoscopy w/o polyps in 2010 so not necessary for him to have Cologuard at this time

## 2013-02-19 NOTE — Telephone Encounter (Signed)
Patient advised of Dr. Marvell Fuller response

## 2013-02-22 ENCOUNTER — Telehealth: Payer: Self-pay | Admitting: Internal Medicine

## 2013-02-22 NOTE — Telephone Encounter (Signed)
Pt notified via tele. DJR  

## 2013-02-22 NOTE — Telephone Encounter (Signed)
Increase lantus from 40 u to 45 u daily

## 2013-02-22 NOTE — Telephone Encounter (Signed)
Patient called and stated that he was suppose to call and report his blood sugar levels. It was 180 last week.

## 2013-02-26 ENCOUNTER — Other Ambulatory Visit: Payer: Self-pay | Admitting: Internal Medicine

## 2013-02-26 NOTE — Telephone Encounter (Signed)
Fenofibrate and Levothyroxine refills sent to pharmacy

## 2013-03-05 ENCOUNTER — Ambulatory Visit: Payer: Medicare Other

## 2013-03-15 ENCOUNTER — Encounter: Payer: Self-pay | Admitting: Internal Medicine

## 2013-03-15 ENCOUNTER — Ambulatory Visit (HOSPITAL_BASED_OUTPATIENT_CLINIC_OR_DEPARTMENT_OTHER)
Admission: RE | Admit: 2013-03-15 | Discharge: 2013-03-15 | Disposition: A | Discharge status: Home / Self Care | Payer: Medicare Other | Source: Ambulatory Visit | Attending: Internal Medicine | Admitting: Internal Medicine

## 2013-03-15 ENCOUNTER — Ambulatory Visit (INDEPENDENT_AMBULATORY_CARE_PROVIDER_SITE_OTHER): Payer: Medicare Other | Admitting: Internal Medicine

## 2013-03-15 VITALS — BP 148/73 | HR 62 | Temp 99.3°F

## 2013-03-15 DIAGNOSIS — D500 Iron deficiency anemia secondary to blood loss (chronic): Secondary | ICD-10-CM | POA: Diagnosis not present

## 2013-03-15 DIAGNOSIS — E039 Hypothyroidism, unspecified: Secondary | ICD-10-CM

## 2013-03-15 DIAGNOSIS — M25519 Pain in unspecified shoulder: Secondary | ICD-10-CM | POA: Diagnosis not present

## 2013-03-15 DIAGNOSIS — I10 Essential (primary) hypertension: Secondary | ICD-10-CM

## 2013-03-15 DIAGNOSIS — S4980XA Other specified injuries of shoulder and upper arm, unspecified arm, initial encounter: Secondary | ICD-10-CM | POA: Diagnosis not present

## 2013-03-15 DIAGNOSIS — S4991XA Unspecified injury of right shoulder and upper arm, initial encounter: Secondary | ICD-10-CM

## 2013-03-15 DIAGNOSIS — Z23 Encounter for immunization: Secondary | ICD-10-CM | POA: Diagnosis not present

## 2013-03-15 DIAGNOSIS — E119 Type 2 diabetes mellitus without complications: Secondary | ICD-10-CM

## 2013-03-15 DIAGNOSIS — E538 Deficiency of other specified B group vitamins: Secondary | ICD-10-CM

## 2013-03-15 DIAGNOSIS — M899 Disorder of bone, unspecified: Secondary | ICD-10-CM

## 2013-03-15 DIAGNOSIS — Z7901 Long term (current) use of anticoagulants: Secondary | ICD-10-CM | POA: Diagnosis not present

## 2013-03-15 DIAGNOSIS — W19XXXA Unspecified fall, initial encounter: Secondary | ICD-10-CM | POA: Insufficient documentation

## 2013-03-15 LAB — POCT INR: INR: 3.4

## 2013-03-15 MED ORDER — TRAMADOL HCL 50 MG PO TABS: 50.0000 mg | ORAL_TABLET | Freq: Two times a day (BID) | ORAL | Status: DC | PRN

## 2013-03-15 NOTE — Progress Notes (Signed)
Subjective:    Patient ID: [name], male    DOB: 1926/03/29, 79 y.o.   MRN: [id number]  HPI Routine visit Diabetes, on 45 units of Lantus, amb  blood sugar average 170, 190. Hypothyroidism, good compliance with Synthroid Anemia, saw GI, they requested labs for this month. Coumadin--good compliance of medication, INR slightly elevated today. Also, the patient actually had a fall last night, landed on his right shoulder, it was to trip and fall type of accident, no syncope before or after this. Unable to mobilize the right shoulder since.   Past Medical History  Diagnosis Date  . Diabetes mellitus, type 2   . CAD (coronary artery disease)   . Hypertension   . Hyperlipidemia   . Atrial fibrillation   . AAA (abdominal aortic aneurysm)   . Hypothyroidism   . BPH (benign prostatic hyperplasia)     reports aprocedure (TURP) remotely in [place].Marland KitchenNo futher f/u w/ urology  . GERD (gastroesophageal reflux disease)   . Insomnia     transient  . Vitamin B12 deficiency   . Osteopenia     dexa 2-11  . Diverticulosis     left colon  . Erosive gastritis   . Anemia due to chronic blood loss 11/25/2008    Recurrent over the years EGD and colonoscopy x 2 each 2004 and 2010 without cause     Past Surgical History  Procedure Laterality Date  . Pacemaker  1980, 1995, 2013    medtronic minix 8341  . Cholecystectomy, laparoscopic    . Prostatectomy      transurethral  . Inguinal herniorrhaphies      bilateral  . Right hip replacement    . Coronary artery bypass graft      1999 stents in 2000  . Penile prosthesis implant  1992  . Transthoracic echocardiogram  12/2006  . Esophagogastroduodenoscopy      multiple  . Colonoscopy      multiple  . Cataract extraction      right  . Cardiac catheterization  01/31/06, 09/25/10, 09-2011  . Givens capsule study N/A 11/04/2012    Procedure: GIVENS CAPSULE STUDY;  Surgeon: Iva Boop, MD;  Location: [facility];  Service: Endoscopy;  Laterality:  N/A;   History   Social History  . Marital Status: Legally Separated    Spouse Name: N/A    Number of Children: 5  . Years of Education: N/A   Occupational History  . retired    Social History Main Topics  . Smoking status: Never Smoker   . Smokeless tobacco: Never Used  . Alcohol Use: .6 - 1.2 oz/week    1-2 Glasses of wine per week  . Drug Use: No  . Sexual Activity: No   Other Topics Concern  . Not on file   Social History Narrative   Lives by self, independent on ADL, retired. Separated from wife.    Daughters:   [name] ( lives in [place]) [phone]   Roanna Raider ( lives out of town)  [phone]          Review of Systems Denies neck pain or back pain. No headache, nausea or vomiting. Denies any head or neck injury.     Objective:   Physical Exam  BP 148/73  Pulse 62  Temp(Src) 99.3 F (37.4 C)  SpO2 98% General -- alert, well-developed, NAD.  Lungs -- normal respiratory effort, no intercostal retractions, no accessory muscle use, and normal breath sounds.  Heart-- normal rate,  regular rhythm, no murmur.  Extremities-- no pretibial edema bilaterally . Left shoulder normal Right shoulder without obvious deformity, range of motion is severely decreased due to pain. Slightly tender to palpation anteriorly. Good distal pulses He has 12x5 cm hematoma on the inner right arm Neurologic--  alert & oriented X3. Speech normal, gait normal. Psych-- Cognition and judgment appear intact. Cooperative with normal attention span and concentration. No anxious appearing , no depressed appearing.      Assessment & Plan:

## 2013-03-15 NOTE — Assessment & Plan Note (Signed)
Last vitamin D few months ago normal

## 2013-03-15 NOTE — Assessment & Plan Note (Signed)
Last B12 check very good

## 2013-03-15 NOTE — Assessment & Plan Note (Signed)
Saw GI last month, they request a CBC and ferritin Labs

## 2013-03-15 NOTE — Assessment & Plan Note (Signed)
Currently on Lantus 45 units daily, ambulatory CBG range from 170, 190. Will check the A1c, further advice would results. Patient would like not to take insulin.

## 2013-03-15 NOTE — Assessment & Plan Note (Addendum)
New issue, fell last night, injured his shoulder,  suspect a fracture. X-ray Refer to orthopedics ([facility]) Sling Pain -- ultram, h/o allergy to codeine, does not recall what meds he took before, warned about excessive somnolence

## 2013-03-15 NOTE — Assessment & Plan Note (Signed)
BP seems OK, check a BMP

## 2013-03-15 NOTE — Assessment & Plan Note (Signed)
Due for a TSH 

## 2013-03-15 NOTE — Assessment & Plan Note (Signed)
INR today 3.4, has been taking the same dose of Coumadin for long time, 3 mg qd. Plan: Hold Coumadin for 2 days, go back to 3 mg  daily Next in 2 weeks

## 2013-03-15 NOTE — Patient Instructions (Addendum)
Get your blood work before you leave   Skip coumadin 2 times, then go back to 1 tablet a day Next coumadin check in 2 weeks  Next visit in 3 months for a diabetes hypertension   follow up (30 minutes)  . Fasting Please make an appointment    Please get your x-ray at the other   office located at: [address] [place], across from [facility].  Please go to the basement, this is a walk-in facility, they are open from 8:30 to 5:30 PM. Phone number [phone]. Or Get the XR at [facility], corner of [address] (10 minutes form here); they are open 24/7 [address]  [place], [place] [address] [phone]

## 2013-03-16 ENCOUNTER — Telehealth: Payer: Self-pay | Admitting: *Deleted

## 2013-03-16 DIAGNOSIS — M25519 Pain in unspecified shoulder: Secondary | ICD-10-CM | POA: Diagnosis not present

## 2013-03-16 DIAGNOSIS — S40019A Contusion of unspecified shoulder, initial encounter: Secondary | ICD-10-CM | POA: Diagnosis not present

## 2013-03-16 LAB — CBC WITH DIFFERENTIAL/PLATELET
Basophils Absolute: 0 10*3/uL (ref 0.0–0.1)
Basophils Relative: 0.5 % (ref 0.0–3.0)
Eosinophils Absolute: 0.1 10*3/uL (ref 0.0–0.7)
Hemoglobin: 13.4 g/dL (ref 13.0–17.0)
Lymphs Abs: 1 10*3/uL (ref 0.7–4.0)
MCHC: 34.3 g/dL (ref 30.0–36.0)
MCV: 99.2 fl (ref 78.0–100.0)
Monocytes Absolute: 0.9 10*3/uL (ref 0.1–1.0)
Neutro Abs: 6.5 10*3/uL (ref 1.4–7.7)
Platelets: 198 10*3/uL (ref 150.0–400.0)
RBC: 3.92 Mil/uL — ABNORMAL LOW (ref 4.22–5.81)
WBC: 8.5 10*3/uL (ref 4.5–10.5)

## 2013-03-16 LAB — BASIC METABOLIC PANEL
CO2: 23 mEq/L (ref 19–32)
Chloride: 103 mEq/L (ref 96–112)
Sodium: 133 mEq/L — ABNORMAL LOW (ref 135–145)

## 2013-03-16 LAB — TSH: TSH: 0.91 u[IU]/mL (ref 0.35–5.50)

## 2013-03-16 LAB — FERRITIN: Ferritin: 139.8 ng/mL (ref 22.0–322.0)

## 2013-03-16 NOTE — Telephone Encounter (Signed)
Spoke with patient to let him know that his imaging results were negative for fracture and that Dr. Drue Novel would still like to proceed with Ortho visit. Patient verbalized understanding.

## 2013-03-17 ENCOUNTER — Encounter: Payer: Self-pay | Admitting: *Deleted

## 2013-03-29 ENCOUNTER — Ambulatory Visit (INDEPENDENT_AMBULATORY_CARE_PROVIDER_SITE_OTHER): Payer: Medicare Other | Admitting: *Deleted

## 2013-03-29 VITALS — BP 130/78 | HR 60 | Temp 98.2°F | Wt 177.2 lb

## 2013-03-29 DIAGNOSIS — I4891 Unspecified atrial fibrillation: Secondary | ICD-10-CM

## 2013-03-29 DIAGNOSIS — Z7901 Long term (current) use of anticoagulants: Secondary | ICD-10-CM

## 2013-03-29 NOTE — Patient Instructions (Signed)
Take one 3mg  daily tablet daily. Return in 4 weeks for INR check

## 2013-03-31 ENCOUNTER — Telehealth: Payer: Self-pay | Admitting: Internal Medicine

## 2013-03-31 ENCOUNTER — Other Ambulatory Visit: Payer: Self-pay | Admitting: Internal Medicine

## 2013-03-31 DIAGNOSIS — E1310 Other specified diabetes mellitus with ketoacidosis without coma: Secondary | ICD-10-CM

## 2013-03-31 MED ORDER — GLUCOSE BLOOD VI STRP: ORAL_STRIP | Status: DC

## 2013-03-31 NOTE — Telephone Encounter (Addendum)
RF for Contour Next Strips sent to [facility].   Spoke with pt who is requesting a refill on Tramadol Last OV 03/15/13 Last filled 03/15/13 #30 with no refills Contract on file No UDS on file Okay to refill?

## 2013-03-31 NOTE — Telephone Encounter (Signed)
[facility] pharmacy is calling on the patient's behalf requesting rx's for the patient's Tramadol and also Contour Next test strips which must have dx code and specific directions and quantity.

## 2013-04-01 DIAGNOSIS — M25519 Pain in unspecified shoulder: Secondary | ICD-10-CM | POA: Diagnosis not present

## 2013-04-01 MED ORDER — TRAMADOL HCL 50 MG PO TABS: 50.0000 mg | ORAL_TABLET | Freq: Two times a day (BID) | ORAL | Status: DC | PRN

## 2013-04-01 NOTE — Telephone Encounter (Addendum)
Printed Rx for 40, no Rf He is having an acute problem, Ifit becomes a chronic issue will get a UDS

## 2013-04-01 NOTE — Addendum Note (Signed)
Addended by: [name] E on: 04/01/2013 08:12 AM   Modules accepted: Orders

## 2013-04-01 NOTE — Telephone Encounter (Signed)
Script faxed to pharmacy

## 2013-04-12 ENCOUNTER — Other Ambulatory Visit: Payer: Self-pay | Admitting: Internal Medicine

## 2013-04-13 NOTE — Telephone Encounter (Signed)
rx refilled per protocol. DJR  

## 2013-04-21 ENCOUNTER — Encounter: Payer: Self-pay | Admitting: *Deleted

## 2013-04-26 ENCOUNTER — Ambulatory Visit (INDEPENDENT_AMBULATORY_CARE_PROVIDER_SITE_OTHER): Payer: Medicare Other | Admitting: *Deleted

## 2013-04-26 VITALS — BP 122/72 | HR 61 | Temp 98.0°F | Wt 175.0 lb

## 2013-04-26 DIAGNOSIS — I4891 Unspecified atrial fibrillation: Secondary | ICD-10-CM | POA: Diagnosis not present

## 2013-04-26 DIAGNOSIS — Z7901 Long term (current) use of anticoagulants: Secondary | ICD-10-CM

## 2013-04-26 DIAGNOSIS — Z5181 Encounter for therapeutic drug level monitoring: Secondary | ICD-10-CM

## 2013-04-26 LAB — POCT INR: INR: 1.7

## 2013-04-26 NOTE — Patient Instructions (Signed)
Take one 3mg  daily tablet daily. Return in 2 weeks for INR check

## 2013-04-28 ENCOUNTER — Telehealth: Payer: Self-pay | Admitting: Internal Medicine

## 2013-04-28 NOTE — Telephone Encounter (Signed)
Patient is calling to request a 90-day supply of his pravastatin (PRAVACHOL) 40 MG tablet. He would like it sent to [facility] pharmacy. Please advise.

## 2013-04-29 ENCOUNTER — Other Ambulatory Visit: Payer: Self-pay | Admitting: *Deleted

## 2013-04-29 MED ORDER — PRAVASTATIN SODIUM 40 MG PO TABS: 80.0000 mg | ORAL_TABLET | Freq: Every day | ORAL | Status: DC

## 2013-04-29 NOTE — Telephone Encounter (Signed)
Done. DJR  

## 2013-04-29 NOTE — Telephone Encounter (Signed)
rx refilled per protocol. DJR  

## 2013-05-03 DIAGNOSIS — S43499A Other sprain of unspecified shoulder joint, initial encounter: Secondary | ICD-10-CM | POA: Diagnosis not present

## 2013-05-03 DIAGNOSIS — M25519 Pain in unspecified shoulder: Secondary | ICD-10-CM | POA: Diagnosis not present

## 2013-05-10 ENCOUNTER — Ambulatory Visit (INDEPENDENT_AMBULATORY_CARE_PROVIDER_SITE_OTHER): Payer: Medicare Other | Admitting: *Deleted

## 2013-05-10 VITALS — BP 122/78 | HR 81 | Temp 98.3°F | Wt 173.6 lb

## 2013-05-10 DIAGNOSIS — I4891 Unspecified atrial fibrillation: Secondary | ICD-10-CM | POA: Diagnosis not present

## 2013-05-10 DIAGNOSIS — Z7901 Long term (current) use of anticoagulants: Secondary | ICD-10-CM

## 2013-05-10 DIAGNOSIS — Z5181 Encounter for therapeutic drug level monitoring: Secondary | ICD-10-CM

## 2013-05-10 LAB — POCT INR: INR: 1.9

## 2013-05-10 NOTE — Patient Instructions (Signed)
Take 6mg  today (05/10/2013). 3mg  all days except on Monday 4.5 mg. Recheck in 2 weeks

## 2013-05-11 ENCOUNTER — Other Ambulatory Visit: Payer: Self-pay | Admitting: Internal Medicine

## 2013-05-11 NOTE — Telephone Encounter (Signed)
rx refilled per protocol. DJR  

## 2013-05-24 ENCOUNTER — Ambulatory Visit (INDEPENDENT_AMBULATORY_CARE_PROVIDER_SITE_OTHER): Payer: Medicare Other | Admitting: *Deleted

## 2013-05-24 VITALS — BP 130/80 | HR 60 | Temp 98.2°F | Wt 174.6 lb

## 2013-05-24 DIAGNOSIS — Z7901 Long term (current) use of anticoagulants: Secondary | ICD-10-CM | POA: Diagnosis not present

## 2013-05-24 DIAGNOSIS — I4891 Unspecified atrial fibrillation: Secondary | ICD-10-CM | POA: Diagnosis not present

## 2013-05-24 DIAGNOSIS — Z5181 Encounter for therapeutic drug level monitoring: Secondary | ICD-10-CM | POA: Diagnosis not present

## 2013-05-24 LAB — POCT INR: INR: 2.3

## 2013-05-24 NOTE — Patient Instructions (Signed)
3mg  all days except on Monday 4.5 mg. Recheck in 4 weeks

## 2013-06-10 DIAGNOSIS — H5210 Myopia, unspecified eye: Secondary | ICD-10-CM | POA: Diagnosis not present

## 2013-06-10 DIAGNOSIS — H4011X0 Primary open-angle glaucoma, stage unspecified: Secondary | ICD-10-CM | POA: Diagnosis not present

## 2013-06-10 DIAGNOSIS — H532 Diplopia: Secondary | ICD-10-CM | POA: Diagnosis not present

## 2013-06-10 DIAGNOSIS — H409 Unspecified glaucoma: Secondary | ICD-10-CM | POA: Diagnosis not present

## 2013-06-14 DIAGNOSIS — M25519 Pain in unspecified shoulder: Secondary | ICD-10-CM | POA: Diagnosis not present

## 2013-06-15 ENCOUNTER — Ambulatory Visit (INDEPENDENT_AMBULATORY_CARE_PROVIDER_SITE_OTHER): Payer: Medicare Other | Admitting: Internal Medicine

## 2013-06-15 ENCOUNTER — Other Ambulatory Visit: Payer: Self-pay | Admitting: Internal Medicine

## 2013-06-15 ENCOUNTER — Encounter: Payer: Self-pay | Admitting: Internal Medicine

## 2013-06-15 VITALS — BP 112/64 | HR 60 | Temp 97.8°F | Wt 166.0 lb

## 2013-06-15 DIAGNOSIS — E119 Type 2 diabetes mellitus without complications: Secondary | ICD-10-CM | POA: Diagnosis not present

## 2013-06-15 DIAGNOSIS — E538 Deficiency of other specified B group vitamins: Secondary | ICD-10-CM

## 2013-06-15 MED ORDER — INSULIN NPH (HUMAN) (ISOPHANE) 100 UNIT/ML ~~LOC~~ SUSP: SUBCUTANEOUS | Status: DC

## 2013-06-15 NOTE — Assessment & Plan Note (Signed)
He has some supplements but states he is unable to swallow them, recommend to get some OTC vitamin B 12 that is easy to swallow and take one tablet daily

## 2013-06-15 NOTE — Progress Notes (Signed)
   Subjective:    Patient ID: [name], male    DOB: 1925/10/28, 80 y.o.   MRN: [id number]  HPI ROV Diabetes--his main concern today is the fact that he cannot afford Lantus, he is actually taking less than what he is supposed, 55 ---> takes only  20 units a day. Also self restarted metformin 850 mg twice a day, he had a leftover medication from the past. B12 deficiency--not taking supplements,  Can't swallow the pills he has Hypothyroidism--good compliance with medications Hypertension--good compliance with medicines, BP today is very good.  Past Medical History  Diagnosis Date  . Diabetes mellitus, type 2   . CAD (coronary artery disease)   . Hypertension   . Hyperlipidemia   . Atrial fibrillation   . AAA (abdominal aortic aneurysm)   . Hypothyroidism   . BPH (benign prostatic hyperplasia)     reports aprocedure (TURP) remotely in [place].Marland KitchenNo futher f/u w/ urology  . GERD (gastroesophageal reflux disease)   . Insomnia     transient  . Vitamin B12 deficiency   . Osteopenia     dexa 2-11  . Diverticulosis     left colon  . Erosive gastritis   . Anemia due to chronic blood loss 11/25/2008    Recurrent over the years EGD and colonoscopy x 2 each 2004 and 2010 without cause     Past Surgical History  Procedure Laterality Date  . Pacemaker  1980, 1995, 2013    medtronic minix 8341  . Cholecystectomy, laparoscopic    . Prostatectomy      transurethral  . Inguinal herniorrhaphies      bilateral  . Right hip replacement    . Coronary artery bypass graft      1999 stents in 2000  . Penile prosthesis implant  1992  . Transthoracic echocardiogram  12/2006  . Esophagogastroduodenoscopy      multiple  . Colonoscopy      multiple  . Cataract extraction      right  . Cardiac catheterization  01/31/06, 09/25/10, 09-2011  . Givens capsule study N/A 11/04/2012    Procedure: GIVENS CAPSULE STUDY;  Surgeon: Gatha Mayer, MD;  Location: [facility];  Service: Endoscopy;  Laterality:  N/A;     Review of Systems Denies chest pain or difficulty breathing No nausea, vomiting, occasional he has loose stools.     Objective:   Physical Exam  BP 112/64  Pulse 60  Temp(Src) 97.8 F (36.6 C)  Wt 166 lb (75.297 kg)  SpO2 95% General -- alert, well-developed, NAD.  Neurologic--  alert & oriented X3. Speech normal, gait normal, strength normal in all extremities.    Psych-- Cognition and judgment appear intact. Cooperative with normal attention span and concentration. No anxious or depressed appearing.      Assessment & Plan:  Today , I spent more than 25 min with the patient, >50% of the time counseling, and coordinating his care

## 2013-06-15 NOTE — Progress Notes (Signed)
Pre visit review using our clinic review tool, if applicable. No additional management support is needed unless otherwise documented below in the visit note. 

## 2013-06-15 NOTE — Patient Instructions (Signed)
Stop metformin and Lantus Start NPH 10 units twice a day Check your blood sugars 2 hours after breakfast and 2 hours after dinner. Bring a log in 10 days Next visit in 6 weeks

## 2013-06-15 NOTE — Assessment & Plan Note (Addendum)
Unable to afford Lantus, currently taking only 20 units a day. He self restarted metformin 850 mg twice a day. With this regimen his blood sugars around 155. Last BMP show a creatinine of 1.4 however his calculated creatinine clearance is 41. After talking with the patient's pharmacist I think NPH is an option. Plan: Discontinue Lantus and metformin NPH 10 units twice a day, one hour before breakfast and 1 hour before dinner. Encouraged him to, call the office if there is questions about how to use the insulin, my nurse will go over how to measure NPH (the patient states that his pharmacyst is really good and explain that to him). We also need to check his sugar postprandially, see instructions.

## 2013-06-16 NOTE — Telephone Encounter (Signed)
Pravastatin refilled per protocol. JG//CMA 

## 2013-06-17 DIAGNOSIS — H4010X0 Unspecified open-angle glaucoma, stage unspecified: Secondary | ICD-10-CM | POA: Diagnosis not present

## 2013-06-18 ENCOUNTER — Telehealth: Payer: Self-pay | Admitting: *Deleted

## 2013-06-18 ENCOUNTER — Telehealth: Payer: Self-pay

## 2013-06-18 NOTE — Telephone Encounter (Signed)
Patient called and stated that dr Larose Kells started him on insulin for 10 units and his reading today was 281. Patient would like for a nurse to call him back.

## 2013-06-18 NOTE — Telephone Encounter (Signed)
Called and was unable to leave message. JG//CMA 

## 2013-06-18 NOTE — Telephone Encounter (Signed)
Relevant patient education assigned to patient using Emmi. ° °

## 2013-06-21 ENCOUNTER — Ambulatory Visit (INDEPENDENT_AMBULATORY_CARE_PROVIDER_SITE_OTHER): Payer: Medicare Other

## 2013-06-21 VITALS — BP 115/62 | HR 60 | Temp 98.5°F | Wt 166.8 lb

## 2013-06-21 DIAGNOSIS — Z7901 Long term (current) use of anticoagulants: Secondary | ICD-10-CM

## 2013-06-21 LAB — POCT INR: INR: 2.7

## 2013-06-21 NOTE — Telephone Encounter (Signed)
Increase  NPH from 10 units before breakfast and 10 units before dinner to 20 units twice a day Call w/  blood sugar readings in 1 week, watch for low CBGs

## 2013-06-21 NOTE — Telephone Encounter (Signed)
Spoke with patient this morning. He states that his blood sugar yesterday morning was 357 and last night was 247. This morning was 329 and he took it again about 2 hours later and got 247. He is very concerned that his new insulin is not working for him. Please advise. JG//CMA

## 2013-06-21 NOTE — Telephone Encounter (Signed)
Spoke with patient and glucose readings are as follows:  2/5--305 am  306 pm 2/6--231 am  296 pm 2/7--153 am  248 pm 2/8--329 am  247 pm 2/9--357 am  Do you want to change dosing yet?

## 2013-06-21 NOTE — Telephone Encounter (Signed)
Advised patient in office. Will call with readings and if any unusual readings with the week.

## 2013-06-21 NOTE — Patient Instructions (Signed)
Continue same dosing. 3 mg all days except Monday take 4.5 mg  Recheck in 4 weeks.

## 2013-06-30 ENCOUNTER — Telehealth: Payer: Self-pay

## 2013-06-30 DIAGNOSIS — E1310 Other specified diabetes mellitus with ketoacidosis without coma: Secondary | ICD-10-CM

## 2013-06-30 MED ORDER — INSULIN SYRINGES (DISPOSABLE) U-100 1 ML MISC: Status: DC

## 2013-06-30 MED ORDER — GLUCOSE BLOOD VI STRP: ORAL_STRIP | Status: DC

## 2013-06-30 NOTE — Addendum Note (Signed)
Addended by: Chilton Greathouse on: 06/30/2013 02:32 PM   Modules accepted: Orders

## 2013-06-30 NOTE — Telephone Encounter (Signed)
Spoke with patient and confirmed that he is currently taking 20 units NPH BID. Advised the patient to increase to 25 units (to be administered before breakfast and again before dinner). Patient also cautioned to watch for low blood sugars and to increase glucose monitoring to TID. Patient verbalized understanding of instructions.

## 2013-06-30 NOTE — Telephone Encounter (Signed)
The pt called and wanted to relay the msg that his blood sugars were running "on average - 253".  I offered him an ov, but the pt declined stating he only wanted his insulin adjusted.  Id be happy to schedule the pt if needed.   Pt Callback - [phone]

## 2013-06-30 NOTE — Telephone Encounter (Signed)
CURRENT  NPH ----->  20 units twice a day (Please confirm with the patient) New NPH---- 25 units Before breakfast, 25 units Before dinner Watch for low blood sugars Need to check his blood sugars 3 times a day----> always in the morning before breakfast and then alternate (2 hours after a meal) Bring log to the front desk in 1 week

## 2013-07-05 ENCOUNTER — Other Ambulatory Visit: Payer: Self-pay | Admitting: Internal Medicine

## 2013-07-05 DIAGNOSIS — H409 Unspecified glaucoma: Secondary | ICD-10-CM | POA: Diagnosis not present

## 2013-07-05 DIAGNOSIS — H4011X0 Primary open-angle glaucoma, stage unspecified: Secondary | ICD-10-CM | POA: Diagnosis not present

## 2013-07-05 DIAGNOSIS — Z961 Presence of intraocular lens: Secondary | ICD-10-CM | POA: Diagnosis not present

## 2013-07-09 ENCOUNTER — Telehealth: Payer: Self-pay | Admitting: *Deleted

## 2013-07-09 NOTE — Telephone Encounter (Signed)
CURRENT NPH  25 units Before breakfast, 25 units Before dinner New NPH: 30 units  bid  Call w/ readings in 2 weeks

## 2013-07-09 NOTE — Telephone Encounter (Signed)
Patient called to inform Dr Larose Kells of his blood sugar level average of 223. Please advise

## 2013-07-09 NOTE — Telephone Encounter (Signed)
Pt.notified

## 2013-07-20 ENCOUNTER — Ambulatory Visit (INDEPENDENT_AMBULATORY_CARE_PROVIDER_SITE_OTHER): Payer: Medicare Other

## 2013-07-20 VITALS — BP 137/65 | HR 56 | Temp 98.6°F | Wt 174.8 lb

## 2013-07-20 DIAGNOSIS — Z7901 Long term (current) use of anticoagulants: Secondary | ICD-10-CM | POA: Diagnosis not present

## 2013-07-20 LAB — POCT INR: INR: 2.8

## 2013-07-20 NOTE — Progress Notes (Signed)
Pre visit review using our clinic review tool, if applicable. No additional management support is needed unless otherwise documented below in the visit note. 

## 2013-07-20 NOTE — Patient Instructions (Addendum)
Take 3mg  all days except Monday take 4.5mg . Recheck in 4 weeks--appt made New Insulin directions: take 35u bid; monitor daily averages. Call with averages in 2 weeks.

## 2013-07-27 ENCOUNTER — Encounter: Payer: Self-pay | Admitting: Internal Medicine

## 2013-07-27 ENCOUNTER — Ambulatory Visit (INDEPENDENT_AMBULATORY_CARE_PROVIDER_SITE_OTHER): Payer: Medicare Other | Admitting: Internal Medicine

## 2013-07-27 VITALS — BP 146/66 | HR 66 | Temp 97.8°F | Wt 176.0 lb

## 2013-07-27 DIAGNOSIS — E119 Type 2 diabetes mellitus without complications: Secondary | ICD-10-CM | POA: Diagnosis not present

## 2013-07-27 NOTE — Progress Notes (Signed)
Subjective:    Patient ID: [name], male    DOB: 07/11/1925, 80 y.o.   MRN: [id number]  DOS:  07/27/2013 Type of  visit:  ROV  Here for diabetes management, currently taking 35 units of NPH twice a day. In the morning, before breakfast sugars usually 180, only on 3 occasions he has seen in the 90s. After lunch or after supper it range from 145-250.  ROS Meds is reviewed, good compliance with all medications. Diet has improved. Denies any symptoms consistent with low blood sugar   Past Medical History  Diagnosis Date  . Diabetes mellitus, type 2   . CAD (coronary artery disease)   . Hypertension   . Hyperlipidemia   . Atrial fibrillation   . AAA (abdominal aortic aneurysm)   . Hypothyroidism   . BPH (benign prostatic hyperplasia)     reports aprocedure (TURP) remotely in [place].Marland KitchenNo futher f/u w/ urology  . GERD (gastroesophageal reflux disease)   . Insomnia     transient  . Vitamin B12 deficiency   . Osteopenia     dexa 2-11  . Diverticulosis     left colon  . Erosive gastritis   . Anemia due to chronic blood loss 11/25/2008    Recurrent over the years EGD and colonoscopy x 2 each 2004 and 2010 without cause      Past Surgical History  Procedure Laterality Date  . Pacemaker  1980, 1995, 2013    medtronic minix 8341  . Cholecystectomy, laparoscopic    . Prostatectomy      transurethral  . Inguinal herniorrhaphies      bilateral  . Right hip replacement    . Coronary artery bypass graft      1999 stents in 2000  . Penile prosthesis implant  1992  . Transthoracic echocardiogram  12/2006  . Esophagogastroduodenoscopy      multiple  . Colonoscopy      multiple  . Cataract extraction      right  . Cardiac catheterization  01/31/06, 09/25/10, 09-2011  . Givens capsule study N/A 11/04/2012    Procedure: GIVENS CAPSULE STUDY;  Surgeon: Gatha Mayer, MD;  Location: [facility];  Service: Endoscopy;  Laterality: N/A;    History   Social History  . Marital  Status: Legally Separated    Spouse Name: N/A    Number of Children: 5  . Years of Education: N/A   Occupational History  . retired    Social History Main Topics  . Smoking status: Never Smoker   . Smokeless tobacco: Never Used  . Alcohol Use: .6 - 1.2 oz/week    1-2 Glasses of wine per week  . Drug Use: No  . Sexual Activity: No   Other Topics Concern  . Not on file   Social History Narrative   Lives by self, independent on ADL, retired. Separated from wife.    Daughters:   [name] ( lives in [place]) [phone]   [name] ( lives out of town)  [phone]              Medication List       This list is accurate as of: 07/27/13 11:59 PM.  Always use your most recent med list.               aspirin 81 MG tablet  Take 81 mg by mouth daily.     atenolol 50 MG tablet  Commonly known as:  TENORMIN  Take  1 tablet (50 mg total) by mouth 2 (two) times daily.     B-12 1000 MCG Caps  Take 1 capsule by mouth daily.     bd getting started take home kit Misc  1 kit by Other route once.     digoxin 0.125 MG tablet  Commonly known as:  LANOXIN  Take 1 tablet (0.125 mg total) by mouth daily.     fenofibrate micronized 134 MG capsule  Commonly known as:  LOFIBRA  Take 134 mg by mouth daily before breakfast.     ferrous sulfate 325 (65 FE) MG EC tablet  Take 1 tablet (325 mg total) by mouth 2 (two) times daily with a meal.     glucose blood test strip  Use as instructed     glucose monitoring kit monitoring kit  1 each by Does not apply route as needed for other. For TID glucose testing     insulin NPH Human 100 UNIT/ML injection  Commonly known as:  HUMULIN N,NOVOLIN N  45 units  one hour before breakfast, 35 units one hour before dinner     Insulin Pen Needle 32G X 4 MM Misc  Use as directed.     Insulin Pen Starter Kit Misc  1 kit by Other route once.     Insulin Syringes (Disposable) U-100 1 ML Misc  Use as directed with NPH insulin.      levothyroxine 88 MCG tablet  Commonly known as:  SYNTHROID, LEVOTHROID  TAKE ONE TABLET BY MOUTH ONCE DAILY     nitroGLYCERIN 0.4 MG SL tablet  Commonly known as:  NITROSTAT  Place 0.4 mg under the tongue every 5 (five) minutes as needed. For chest pain     omeprazole 40 MG capsule  Commonly known as:  PRILOSEC  Take 1 capsule (40 mg total) by mouth daily before breakfast. For acid reflux     onetouch ultrasoft lancets  Use as instructed     pravastatin 40 MG tablet  Commonly known as:  PRAVACHOL  TAKE TWO TABLETS BY MOUTH ONCE DAILY     warfarin 3 MG tablet  Commonly known as:  COUMADIN  TAKE AS DIRECTED           Objective:   Physical Exam BP 146/66  Pulse 66  Temp(Src) 97.8 F (36.6 C)  Wt 176 lb (79.833 kg)  SpO2 98%  General -- alert, well-developed, NAD.   Lungs -- normal respiratory effort, no intercostal retractions, no accessory muscle use, and normal breath sounds.   Extremities-- no pretibial edema bilaterally  Neurologic--  alert & oriented X3. Speech normal, gait normal, strength normal in all extremities.  Psych-- Cognition and judgment appear intact. Cooperative with normal attention span and concentration. No anxious or depressed appearing.       Assessment & Plan:

## 2013-07-27 NOTE — Patient Instructions (Addendum)
Get your blood work before you leave   Next visit is for routine check up regards your blood sugar  in 3 months  No need to come back fasting Please make an appointment    Continue checking her blood sugars twice a day, bring the  Readings the next time you come for a Coumadin check (08-20-13)   Next visit to see me in 3 months, please make an appointment

## 2013-07-28 LAB — HEMOGLOBIN A1C: HEMOGLOBIN A1C: 8.3 % — AB (ref 4.6–6.5)

## 2013-07-28 NOTE — Assessment & Plan Note (Signed)
Diabetes, check a A1c Increase  NPH in the morning to 45, continue with 35 units at night. Watch for low blood sugar symptoms, symptoms discuss with the patient.

## 2013-07-29 ENCOUNTER — Other Ambulatory Visit: Payer: Self-pay | Admitting: Internal Medicine

## 2013-07-29 NOTE — Telephone Encounter (Signed)
Forwarding to you :)

## 2013-07-30 ENCOUNTER — Other Ambulatory Visit: Payer: Self-pay | Admitting: *Deleted

## 2013-07-30 MED ORDER — INSULIN NPH (HUMAN) (ISOPHANE) 100 UNIT/ML ~~LOC~~ SUSP: SUBCUTANEOUS | Status: DC

## 2013-07-30 NOTE — Telephone Encounter (Signed)
Med refilled. JG//CMA

## 2013-07-30 NOTE — Telephone Encounter (Signed)
Patient called to check on his insulin refill because he is going to be out of insulin in the next day or so. Please advise

## 2013-08-05 ENCOUNTER — Telehealth: Payer: Self-pay

## 2013-08-05 NOTE — Telephone Encounter (Signed)
Relevant patient education assigned to patient using Emmi. ° °

## 2013-08-20 ENCOUNTER — Ambulatory Visit (INDEPENDENT_AMBULATORY_CARE_PROVIDER_SITE_OTHER): Payer: Medicare Other | Admitting: *Deleted

## 2013-08-20 VITALS — BP 126/76 | HR 84 | Temp 98.5°F | Wt 182.0 lb

## 2013-08-20 DIAGNOSIS — Z7901 Long term (current) use of anticoagulants: Secondary | ICD-10-CM | POA: Diagnosis not present

## 2013-08-20 DIAGNOSIS — I4891 Unspecified atrial fibrillation: Secondary | ICD-10-CM | POA: Diagnosis not present

## 2013-08-20 LAB — POCT INR
INR: 2.3
INR: 2.6
INR: 2.6

## 2013-08-20 NOTE — Patient Instructions (Signed)
3mg  all days except on Monday 4.5 mg. Recheck in 4 weeks

## 2013-09-02 ENCOUNTER — Telehealth: Payer: Self-pay

## 2013-09-02 NOTE — Telephone Encounter (Signed)
Received Doctor Order Form from [place] patient to verify that he was indeed using this company for his diabetic supplies.  Pt stated that he was and that he just recently signed up to receive supplies from this company.  He is requesting test strips and a glucose meter.  Order form started and placed in Dr. Ethel Rana red folder for completion and signature.

## 2013-09-14 DIAGNOSIS — H4011X0 Primary open-angle glaucoma, stage unspecified: Secondary | ICD-10-CM | POA: Diagnosis not present

## 2013-09-14 DIAGNOSIS — H409 Unspecified glaucoma: Secondary | ICD-10-CM | POA: Diagnosis not present

## 2013-09-17 ENCOUNTER — Telehealth: Payer: Self-pay | Admitting: *Deleted

## 2013-09-17 ENCOUNTER — Telehealth: Payer: Self-pay

## 2013-09-17 ENCOUNTER — Ambulatory Visit (INDEPENDENT_AMBULATORY_CARE_PROVIDER_SITE_OTHER): Payer: Medicare Other

## 2013-09-17 VITALS — BP 122/65 | HR 59 | Temp 97.9°F | Wt 181.2 lb

## 2013-09-17 DIAGNOSIS — Z7901 Long term (current) use of anticoagulants: Secondary | ICD-10-CM | POA: Diagnosis not present

## 2013-09-17 DIAGNOSIS — I4891 Unspecified atrial fibrillation: Secondary | ICD-10-CM

## 2013-09-17 LAB — POCT INR: INR: 2.3

## 2013-09-17 NOTE — Telephone Encounter (Signed)
Received fax from [facility] for order form for patient's diabetic testing supplies. Form filled out, signed by [name] and faxed to [facility] at [phone]. JG//CMA

## 2013-09-17 NOTE — Patient Instructions (Addendum)
Continue same dosing. Take 3mg  all days except Monday take 4 1/2/mg Recheck in one month.  Friday June 5th at 3:30pm

## 2013-09-17 NOTE — Telephone Encounter (Signed)
Okay, no change for now

## 2013-09-17 NOTE — Progress Notes (Signed)
Pre visit review using our clinic review tool, if applicable. No additional management support is needed unless otherwise documented below in the visit note. 

## 2013-09-17 NOTE — Telephone Encounter (Signed)
FYI Patient states that he has been checking his blood sugar and his average is 167.

## 2013-09-20 NOTE — Telephone Encounter (Signed)
Advised patient of plans

## 2013-09-20 NOTE — Telephone Encounter (Signed)
Left message for call back Non identifiable  

## 2013-10-07 ENCOUNTER — Other Ambulatory Visit: Payer: Self-pay | Admitting: Internal Medicine

## 2013-10-12 ENCOUNTER — Other Ambulatory Visit: Payer: Self-pay | Admitting: Internal Medicine

## 2013-10-12 DIAGNOSIS — I10 Essential (primary) hypertension: Secondary | ICD-10-CM

## 2013-10-12 DIAGNOSIS — I4891 Unspecified atrial fibrillation: Secondary | ICD-10-CM

## 2013-10-13 NOTE — Telephone Encounter (Signed)
Refill for coumadin and atenolol sent to [facility]

## 2013-10-14 ENCOUNTER — Other Ambulatory Visit: Payer: Self-pay | Admitting: Internal Medicine

## 2013-10-14 NOTE — Telephone Encounter (Signed)
Please advise regarding refill Sir, thank you. 

## 2013-10-14 NOTE — Telephone Encounter (Signed)
Change to qd and refill x 1 year

## 2013-10-15 ENCOUNTER — Ambulatory Visit: Payer: Medicare Other

## 2013-10-20 ENCOUNTER — Encounter: Payer: Self-pay | Admitting: *Deleted

## 2013-10-21 ENCOUNTER — Telehealth: Payer: Self-pay | Admitting: Cardiology

## 2013-10-21 NOTE — Telephone Encounter (Signed)
Certified letter sent 

## 2013-10-27 ENCOUNTER — Ambulatory Visit (INDEPENDENT_AMBULATORY_CARE_PROVIDER_SITE_OTHER): Payer: Medicare Other | Admitting: Internal Medicine

## 2013-10-27 ENCOUNTER — Encounter: Payer: Self-pay | Admitting: Internal Medicine

## 2013-10-27 VITALS — BP 126/64 | HR 60 | Temp 98.3°F | Wt 184.0 lb

## 2013-10-27 DIAGNOSIS — E785 Hyperlipidemia, unspecified: Secondary | ICD-10-CM

## 2013-10-27 DIAGNOSIS — I10 Essential (primary) hypertension: Secondary | ICD-10-CM | POA: Diagnosis not present

## 2013-10-27 DIAGNOSIS — E119 Type 2 diabetes mellitus without complications: Secondary | ICD-10-CM

## 2013-10-27 DIAGNOSIS — E538 Deficiency of other specified B group vitamins: Secondary | ICD-10-CM

## 2013-10-27 DIAGNOSIS — Z7901 Long term (current) use of anticoagulants: Secondary | ICD-10-CM | POA: Diagnosis not present

## 2013-10-27 LAB — POCT INR: INR: 2.8

## 2013-10-27 NOTE — Patient Instructions (Signed)
Make an appointment for blood work, fasting for this week: BMP--- HTN diabetes FLP ---- high cholesterol A1c, AST, ALT ----diabetes   Continue taking the same,, come back in one month for a Coumadin check  Next visit in 4 months for a physical exam.

## 2013-10-27 NOTE — Assessment & Plan Note (Signed)
Well-controlled, chart reviewed, due for a BMP

## 2013-10-27 NOTE — Progress Notes (Signed)
Pre visit review using our clinic review tool, if applicable. No additional management support is needed unless otherwise documented below in the visit note. 

## 2013-10-27 NOTE — Assessment & Plan Note (Signed)
Due for a cholesterol panel, see instructions

## 2013-10-27 NOTE — Assessment & Plan Note (Signed)
Seems to be doing great with current insulin regimen, check A1c.

## 2013-10-27 NOTE — Assessment & Plan Note (Signed)
Good compliance with over-the-counterSupplements

## 2013-10-27 NOTE — Assessment & Plan Note (Signed)
INR 2.8 today, no change, recheck in 4 weeks

## 2013-10-27 NOTE — Progress Notes (Signed)
Subjective:    Patient ID: [name], male    DOB: [birth year], 80 y.o.   MRN: [id number]  DOS:  10/27/2013 Type of  Visit: Routine  History: Diabetes, good medication compliance, CBGs on average 159. No low blood sugars that he can tell. Hypertension, good medication compliance, no apparent side effects. High cholesterol, chart reviewed, due for an FLP. Good compliance with medication. Due for a Coumadin check, INR 2.8   ROS Denies chest pain, difficulty breathing, but extremity edema or palpitations. No nausea, vomiting, diarrhea or blood in the stools.   Past Medical History  Diagnosis Date  . Diabetes mellitus, type 2   . CAD (coronary artery disease)   . Hypertension   . Hyperlipidemia   . Atrial fibrillation   . AAA (abdominal aortic aneurysm)   . Hypothyroidism   . BPH (benign prostatic hyperplasia)     reports aprocedure (TURP) remotely in [place].Marland KitchenNo futher f/u w/ urology  . GERD (gastroesophageal reflux disease)   . Insomnia     transient  . Vitamin B12 deficiency   . Osteopenia     dexa 2-11  . Diverticulosis     left colon  . Erosive gastritis   . Anemia due to chronic blood loss 11/25/2008    Recurrent over the years EGD and colonoscopy x 2 each 2004 and 2010 without cause      Past Surgical History  Procedure Laterality Date  . Pacemaker  1980, 1995, 2013    medtronic minix 8341  . Cholecystectomy, laparoscopic    . Prostatectomy      transurethral  . Inguinal herniorrhaphies      bilateral  . Right hip replacement    . Coronary artery bypass graft      1999 stents in 2000  . Penile prosthesis implant  1992  . Transthoracic echocardiogram  12/2006  . Esophagogastroduodenoscopy      multiple  . Colonoscopy      multiple  . Cataract extraction      right  . Cardiac catheterization  01/31/06, 09/25/10, 09-2011  . Givens capsule study N/A 11/04/2012    Procedure: GIVENS CAPSULE STUDY;  Surgeon: Gatha Mayer, MD;  Location: [facility];  Service:  Endoscopy;  Laterality: N/A;    History   Social History  . Marital Status: Legally Separated    Spouse Name: N/A    Number of Children: 5  . Years of Education: N/A   Occupational History  . retired    Social History Main Topics  . Smoking status: Never Smoker   . Smokeless tobacco: Never Used  . Alcohol Use: 0.6 - 1.2 oz/week    1-2 Glasses of wine per week  . Drug Use: No  . Sexual Activity: No   Other Topics Concern  . Not on file   Social History Narrative   Lives by self, independent on ADL, retired. Separated from wife.    Daughters:   [name] ( lives in [place]) [phone]   [name] ( lives out of town)  [phone]              Medication List       This list is accurate as of: 10/27/13 11:59 PM.  Always use your most recent med list.               aspirin 81 MG tablet  Take 81 mg by mouth daily.     atenolol 50 MG tablet  Commonly known  as:  TENORMIN  TAKE ONE TABLET BY MOUTH TWICE DAILY     B-12 1000 MCG Caps  Take 1 capsule by mouth daily.     bd getting started take home kit Misc  1 kit by Other route once.     digoxin 0.125 MG tablet  Commonly known as:  LANOXIN  TAKE ONE TABLET BY MOUTH ONCE DAILY     fenofibrate micronized 134 MG capsule  Commonly known as:  LOFIBRA  TAKE ONE CAPSULE BY MOUTH ONCE DAILY     ferrous sulfate 325 (65 FE) MG EC tablet  Take 1 tablet (325 mg total) by mouth daily.     glucose blood test strip  Use as instructed     glucose monitoring kit monitoring kit  1 each by Does not apply route as needed for other. For TID glucose testing     Insulin Pen Needle 32G X 4 MM Misc  Use as directed.     Insulin Pen Starter Kit Misc  1 kit by Other route once.     Insulin Syringes (Disposable) U-100 1 ML Misc  Use as directed with NPH insulin.     levothyroxine 88 MCG tablet  Commonly known as:  SYNTHROID, LEVOTHROID  TAKE ONE TABLET BY MOUTH ONCE DAILY     nitroGLYCERIN 0.4 MG SL tablet  Commonly  known as:  NITROSTAT  Place 0.4 mg under the tongue every 5 (five) minutes as needed. For chest pain     NOVOLIN N RELION 100 UNIT/ML injection  Generic drug:  insulin NPH Human  INJECT 45 UNITS ONE HOUR BEFORE BREAKFAST AND INJECT 35 UNITS ONE HOUR BEFORE SUPPER     omeprazole 40 MG capsule  Commonly known as:  PRILOSEC  Take 1 capsule (40 mg total) by mouth daily before breakfast. For acid reflux     onetouch ultrasoft lancets  Use as instructed     pravastatin 40 MG tablet  Commonly known as:  PRAVACHOL  TAKE TWO TABLETS BY MOUTH ONCE DAILY     warfarin 3 MG tablet  Commonly known as:  COUMADIN  TAKE AS DIRECTED           Objective:   Physical Exam BP 126/64  Pulse 60  Temp(Src) 98.3 F (36.8 C)  Wt 184 lb (83.462 kg)  SpO2 98%  General -- alert, well-developed, NAD.  Lungs -- normal respiratory effort, no intercostal retractions, no accessory muscle use, and normal breath sounds.  Heart-- normal rate, seems in regular rhythm, no murmur.  Extremities-- no pretibial edema bilaterally  Neurologic--  alert & oriented X3. Speech normal, gait appropriate for age, strength symmetric and appropriate for age.   Psych-- Cognition and judgment appear intact. Cooperative with normal attention span and concentration. No anxious or depressed appearing.     Assessment & Plan:

## 2013-10-28 ENCOUNTER — Other Ambulatory Visit (INDEPENDENT_AMBULATORY_CARE_PROVIDER_SITE_OTHER): Payer: Medicare Other

## 2013-10-28 ENCOUNTER — Telehealth: Payer: Self-pay | Admitting: Internal Medicine

## 2013-10-28 DIAGNOSIS — D509 Iron deficiency anemia, unspecified: Secondary | ICD-10-CM | POA: Diagnosis not present

## 2013-10-28 DIAGNOSIS — I10 Essential (primary) hypertension: Secondary | ICD-10-CM | POA: Diagnosis not present

## 2013-10-28 DIAGNOSIS — E7800 Pure hypercholesterolemia, unspecified: Secondary | ICD-10-CM

## 2013-10-28 DIAGNOSIS — E119 Type 2 diabetes mellitus without complications: Secondary | ICD-10-CM | POA: Diagnosis not present

## 2013-10-28 DIAGNOSIS — I4891 Unspecified atrial fibrillation: Secondary | ICD-10-CM

## 2013-10-28 DIAGNOSIS — E538 Deficiency of other specified B group vitamins: Secondary | ICD-10-CM

## 2013-10-28 LAB — BASIC METABOLIC PANEL
BUN: 18 mg/dL (ref 6–23)
CALCIUM: 8.9 mg/dL (ref 8.4–10.5)
CO2: 24 mEq/L (ref 19–32)
Chloride: 104 mEq/L (ref 96–112)
Creatinine, Ser: 1.3 mg/dL (ref 0.4–1.5)
GFR: 56.68 mL/min — ABNORMAL LOW (ref >60.00)
GLUCOSE: 142 mg/dL — AB (ref 70–99)
Potassium: 4.1 mEq/L (ref 3.5–5.1)
SODIUM: 136 meq/L (ref 135–145)

## 2013-10-28 LAB — AST: AST: 17 U/L (ref 0–37)

## 2013-10-28 LAB — LIPID PANEL
Cholesterol: 143 mg/dL (ref 0–200)
HDL: 28.7 mg/dL — ABNORMAL LOW (ref >39.00)
LDL Cholesterol: 63 mg/dL (ref 0–99)
NONHDL: 114.3
Total CHOL/HDL Ratio: 5
Triglycerides: 256 mg/dL — ABNORMAL HIGH (ref 0.0–149.0)
VLDL: 51.2 mg/dL — ABNORMAL HIGH (ref 0.0–40.0)

## 2013-10-28 LAB — HEMOGLOBIN A1C: HEMOGLOBIN A1C: 9.1 % — AB (ref 4.6–6.5)

## 2013-10-28 LAB — ALT: ALT: 17 U/L (ref 0–53)

## 2013-10-28 NOTE — Telephone Encounter (Signed)
Relevant patient education assigned to patient using Emmi. ° °

## 2013-11-01 ENCOUNTER — Other Ambulatory Visit: Payer: Self-pay | Admitting: Internal Medicine

## 2013-11-01 ENCOUNTER — Encounter: Payer: Self-pay | Admitting: *Deleted

## 2013-11-01 ENCOUNTER — Telehealth: Payer: Self-pay | Admitting: *Deleted

## 2013-11-01 NOTE — Telephone Encounter (Signed)
Message copied by Chilton Greathouse on Mon Nov 01, 2013  1:23 PM ------      Message from: [name]      Created: Mon Nov 01, 2013  1:03 PM       Advise patient, needs better diabetes control      Increase insulin from 45u and 35u to:      53 units Before breakfast and 40 units before supper      Call with readings in 6 weeks ------

## 2013-11-01 NOTE — Telephone Encounter (Signed)
Spoke with patient and made aware of lab results. Patient also made aware of changes needed to current insulin regime.Letter mailed to patient with further explanation of A1c.

## 2013-11-15 ENCOUNTER — Other Ambulatory Visit: Payer: Self-pay | Admitting: *Deleted

## 2013-11-15 MED ORDER — DIGOXIN 125 MCG PO TABS
ORAL_TABLET | ORAL | Status: DC
Start: 2013-11-15 — End: 2014-02-15

## 2013-11-16 ENCOUNTER — Other Ambulatory Visit: Payer: Self-pay | Admitting: Internal Medicine

## 2013-11-25 ENCOUNTER — Ambulatory Visit (INDEPENDENT_AMBULATORY_CARE_PROVIDER_SITE_OTHER): Payer: Medicare Other

## 2013-11-25 ENCOUNTER — Telehealth: Payer: Self-pay

## 2013-11-25 ENCOUNTER — Other Ambulatory Visit: Payer: Self-pay | Admitting: Internal Medicine

## 2013-11-25 VITALS — BP 138/75 | HR 64 | Temp 97.7°F | Wt 185.0 lb

## 2013-11-25 DIAGNOSIS — I4891 Unspecified atrial fibrillation: Secondary | ICD-10-CM

## 2013-11-25 DIAGNOSIS — Z7901 Long term (current) use of anticoagulants: Secondary | ICD-10-CM

## 2013-11-25 LAB — POCT INR: INR: 2.9

## 2013-11-25 NOTE — Patient Instructions (Signed)
Take 3mg  all days except Monday take 4.5mg   Recheck in 4 weeks Thurs Aug 13th at 330p

## 2013-11-25 NOTE — Progress Notes (Signed)
Pre visit review using our clinic review tool, if applicable. No additional management support is needed unless otherwise documented below in the visit note. 

## 2013-11-25 NOTE — Telephone Encounter (Signed)
Thank you,Good news

## 2013-11-25 NOTE — Telephone Encounter (Signed)
FYI Patient states that his glucose readings are doing better. He is in the 120's.

## 2013-12-21 DIAGNOSIS — H532 Diplopia: Secondary | ICD-10-CM | POA: Diagnosis not present

## 2013-12-23 ENCOUNTER — Ambulatory Visit (INDEPENDENT_AMBULATORY_CARE_PROVIDER_SITE_OTHER): Payer: Medicare Other

## 2013-12-23 VITALS — BP 152/71 | HR 61 | Temp 98.0°F | Wt 188.8 lb

## 2013-12-23 DIAGNOSIS — Z7901 Long term (current) use of anticoagulants: Secondary | ICD-10-CM

## 2013-12-23 DIAGNOSIS — I4891 Unspecified atrial fibrillation: Secondary | ICD-10-CM | POA: Diagnosis not present

## 2013-12-23 LAB — POCT INR: INR: 2.2

## 2013-12-23 NOTE — Progress Notes (Signed)
Pre visit review using our clinic review tool, if applicable. No additional management support is needed unless otherwise documented below in the visit note. 

## 2013-12-23 NOTE — Patient Instructions (Addendum)
Continue to take 3 mg all days except on Monday 4.5 mg. Recheck in 4 weeks (01/20/14 @ 3:45 pm).  See you at the new office!

## 2013-12-29 ENCOUNTER — Ambulatory Visit: Payer: Medicare Other

## 2013-12-31 ENCOUNTER — Other Ambulatory Visit: Payer: Self-pay | Admitting: Internal Medicine

## 2014-01-04 ENCOUNTER — Ambulatory Visit (INDEPENDENT_AMBULATORY_CARE_PROVIDER_SITE_OTHER): Payer: Medicare Other | Admitting: Internal Medicine

## 2014-01-04 ENCOUNTER — Encounter: Payer: Self-pay | Admitting: Internal Medicine

## 2014-01-04 VITALS — BP 144/74 | HR 68 | Ht 67.0 in | Wt 185.0 lb

## 2014-01-04 DIAGNOSIS — Z950 Presence of cardiac pacemaker: Secondary | ICD-10-CM

## 2014-01-04 DIAGNOSIS — I480 Paroxysmal atrial fibrillation: Secondary | ICD-10-CM

## 2014-01-04 DIAGNOSIS — I4891 Unspecified atrial fibrillation: Secondary | ICD-10-CM

## 2014-01-04 LAB — MDC_IDC_ENUM_SESS_TYPE_INCLINIC
Battery Impedance: 685 Ohm
Battery Voltage: 2.76 V
Brady Statistic RV Percent Paced: 83 %
Date Time Interrogation Session: 20150825124322
Lead Channel Impedance Value: 0 Ohm
Lead Channel Impedance Value: 521 Ohm
Lead Channel Pacing Threshold Pulse Width: 0.4 ms
Lead Channel Setting Pacing Pulse Width: 0.4 ms
Lead Channel Setting Sensing Sensitivity: 4 mV
MDC IDC MSMT BATTERY REMAINING LONGEVITY: 72 mo
MDC IDC MSMT LEADCHNL RV PACING THRESHOLD AMPLITUDE: 0.75 V
MDC IDC MSMT LEADCHNL RV SENSING INTR AMPL: 11.2 mV
MDC IDC SET LEADCHNL RV PACING AMPLITUDE: 2.5 V

## 2014-01-04 NOTE — Progress Notes (Signed)
HPI [name] returns today for followup. He is an 80 year old man with a history of symptomatic bradycardia, status post permanent pacemaker insertion over 30 years ago. He is status post his second replacement approximately one year ago. In the interim, no chest pain, shortness of breath, or syncope. No peripheral edema. HIe denies fevers or chills, night sweats, or recurrent nausea or vomiting. He notes that he has had some trouble sleeping and is very sedentary.  Allergies  Allergen Reactions  . Ace Inhibitors     REACTION: cough  . Codeine Phosphate     REACTION: nausea  . Hydrochlorothiazide W-Triamterene     REACTION: cramps     Current Outpatient Prescriptions  Medication Sig Dispense Refill  . aspirin 81 MG tablet Take 81 mg by mouth daily.      [name] atenolol (TENORMIN) 50 MG tablet TAKE ONE TABLET BY MOUTH TWICE DAILY  180 tablet  3  . bd getting started take home kit MISC 1 kit by Other route once.  1 kit  0  . Cyanocobalamin (B-12) 1000 MCG CAPS Take 1 capsule by mouth daily.  30 capsule  11  . digoxin (LANOXIN) 0.125 MG tablet TAKE ONE TABLET BY MOUTH ONCE DAILY  90 tablet  0  . EASY TOUCH INSULIN SYRINGE 31G X 5/16" 1 ML MISC USE AS DIRECTED WITH NPH INSULIN  100 each  5  . fenofibrate micronized (LOFIBRA) 134 MG capsule TAKE ONE CAPSULE BY MOUTH ONCE DAILY  90 capsule  0  . ferrous sulfate 325 (65 FE) MG EC tablet Take 1 tablet (325 mg total) by mouth daily.  30 tablet  11  . Flexpen Starter Kit MISC 1 kit by Other route once.  1 kit  0  . glucose blood test strip Use as instructed  100 each  12  . glucose monitoring kit (FREESTYLE) monitoring kit 1 each by Does not apply route as needed for other. For TID glucose testing  1 each  0  . Insulin Pen Needle 32G X 4 MM MISC Use as directed.  100 each  6  . Lancets (ONETOUCH ULTRASOFT) lancets Use as instructed  100 each  12  . levothyroxine (SYNTHROID, LEVOTHROID) 88 MCG tablet TAKE ONE TABLET BY MOUTH ONCE DAILY  30 tablet  2  .  nitroGLYCERIN (NITROSTAT) 0.4 MG SL tablet Place 0.4 mg under the tongue every 5 (five) minutes as needed. For chest pain      . NOVOLIN N RELION 100 UNIT/ML injection INJECT 45 UNITS UNDER THE SKIN ONE HOUR BEFORE BREAKFAST AND INJECT 35 UNITS ONE HOUR BEFORE SUPPER  10 mL  5  . omeprazole (PRILOSEC) 40 MG capsule Take 1 capsule (40 mg total) by mouth daily before breakfast. For acid reflux      . pravastatin (PRAVACHOL) 40 MG tablet TAKE TWO TABLETS BY MOUTH ONCE DAILY  90 tablet  1  . warfarin (COUMADIN) 3 MG tablet TAKE AS DIRECTED  90 tablet  0   No current facility-administered medications for this visit.     Past Medical History  Diagnosis Date  . Diabetes mellitus, type 2   . CAD (coronary artery disease)   . Hypertension   . Hyperlipidemia   . Atrial fibrillation   . AAA (abdominal aortic aneurysm)   . Hypothyroidism   . BPH (benign prostatic hyperplasia)     reports aprocedure (TURP) remotely in [place].Marland KitchenNo futher f/u w/ urology  . GERD (gastroesophageal reflux disease)   . Insomnia  transient  . Vitamin B12 deficiency   . Osteopenia     dexa 2-11  . Diverticulosis     left colon  . Erosive gastritis   . Anemia due to chronic blood loss 11/25/2008    Recurrent over the years EGD and colonoscopy x 2 each 2004 and 2010 without cause      ROS:   All systems reviewed and negative except as noted in the HPI.   Past Surgical History  Procedure Laterality Date  . Pacemaker  1980, 1995, 2013    medtronic minix 8341  . Cholecystectomy, laparoscopic    . Prostatectomy      transurethral  . Inguinal herniorrhaphies      bilateral  . Right hip replacement    . Coronary artery bypass graft      1999 stents in 2000  . Penile prosthesis implant  1992  . Transthoracic echocardiogram  12/2006  . Esophagogastroduodenoscopy      multiple  . Colonoscopy      multiple  . Cataract extraction      right  . Cardiac catheterization  01/31/06, 09/25/10, 09-2011  . Givens capsule  study N/A 11/04/2012    Procedure: GIVENS CAPSULE STUDY;  Surgeon: Nicholas Mayer, MD;  Location: [facility];  Service: Endoscopy;  Laterality: N/A;     Family History  Problem Relation Age of Onset  . Alzheimer's disease Mother   . Mental illness Mother     alzheimers  . Heart failure Father   . Stroke Father     cva  . Heart disease Father     chf  . Heart disease Brother     maybe  . Diabetes Other     several siblings  . Coronary artery disease Brother     cabg  . Alzheimer's disease Sister   . Mental illness Sister     alzheimers  . Cancer Neg Hx   . Colon cancer Neg Hx   . Esophageal cancer Neg Hx   . Stomach cancer Neg Hx   . Rectal cancer Neg Hx      History   Social History  . Marital Status: Legally Separated    Spouse Name: N/A    Number of Children: 5  . Years of Education: N/A   Occupational History  . retired    Social History Main Topics  . Smoking status: Never Smoker   . Smokeless tobacco: Never Used  . Alcohol Use: 0.6 - 1.2 oz/week    1-2 Glasses of wine per week  . Drug Use: No  . Sexual Activity: No   Other Topics Concern  . Not on file   Social History Narrative   Lives by self, independent on ADL, retired. Separated from wife.    Daughters:   [name] ( lives in [place]) [phone]   [name] ( lives out of town)  [phone]           BP 144/74  Pulse 68  Ht _0  (1.702 m)  Wt 185 lb (83.915 kg)  BMI 28.97 kg/m2  Physical Exam:  Well appearing elderly man,NAD HEENT: Unremarkable Neck:  6 cm JVD, no thyromegally Lungs:  Clear with no wheezes, rales, or rhonchi. Well-healed pacemaker incision HEART:  Regular rate rhythm, grade 2/6 systolic murmur best heard at the right upper sternal border, no rubs, no clicks Abd:  soft, positive bowel sounds, no organomegally, no rebound, no guarding Ext:  2 plus pulses, no edema, no cyanosis,  no clubbing Skin:  No rashes no nodules Neuro:  CN II through XII intact, motor grossly  intact  DEVICE  Normal device function.  See PaceArt for details.   Assess/Plan:

## 2014-01-04 NOTE — Assessment & Plan Note (Signed)
His ventricular rate is well controlled. He is pacing 83% of the time.

## 2014-01-04 NOTE — Patient Instructions (Addendum)
Your physician wants you to follow-up in: 6 months in the device clinic and 12 months with Dr Knox Saliva will receive a reminder letter in the mail two months in advance. If you don't receive a letter, please call our office to schedule the follow-up appointment.   Your physician recommends that you schedule a follow-up appointment in: 3-4 months with Dr Harrington Challenger

## 2014-01-04 NOTE — Assessment & Plan Note (Signed)
His Medtronic VVI PM is working normally. Will recheck in several months.

## 2014-01-11 ENCOUNTER — Encounter: Payer: Self-pay | Admitting: Internal Medicine

## 2014-01-18 ENCOUNTER — Other Ambulatory Visit: Payer: Self-pay | Admitting: Internal Medicine

## 2014-01-18 DIAGNOSIS — H409 Unspecified glaucoma: Secondary | ICD-10-CM | POA: Diagnosis not present

## 2014-01-18 DIAGNOSIS — H4011X0 Primary open-angle glaucoma, stage unspecified: Secondary | ICD-10-CM | POA: Diagnosis not present

## 2014-01-20 ENCOUNTER — Ambulatory Visit: Payer: Medicare Other

## 2014-02-02 ENCOUNTER — Telehealth: Payer: Self-pay | Admitting: Internal Medicine

## 2014-02-02 NOTE — Telephone Encounter (Signed)
Due for a INR, please arrange

## 2014-02-03 NOTE — Telephone Encounter (Signed)
LMOM for Pt to return call and make appt for INR check.

## 2014-02-15 ENCOUNTER — Other Ambulatory Visit: Payer: Self-pay

## 2014-02-15 ENCOUNTER — Other Ambulatory Visit: Payer: Self-pay | Admitting: Internal Medicine

## 2014-02-15 MED ORDER — LEVOTHYROXINE SODIUM 88 MCG PO TABS: ORAL_TABLET | ORAL | Status: DC

## 2014-02-22 ENCOUNTER — Telehealth: Payer: Self-pay | Admitting: Internal Medicine

## 2014-02-22 ENCOUNTER — Other Ambulatory Visit: Payer: Self-pay

## 2014-02-22 MED ORDER — FENOFIBRATE MICRONIZED 134 MG PO CAPS: ORAL_CAPSULE | ORAL | Status: DC

## 2014-02-22 MED ORDER — LEVOTHYROXINE SODIUM 88 MCG PO TABS: ORAL_TABLET | ORAL | Status: DC

## 2014-02-22 MED ORDER — INSULIN NPH (HUMAN) (ISOPHANE) 100 UNIT/ML ~~LOC~~ SUSP: SUBCUTANEOUS | Status: DC

## 2014-02-22 NOTE — Telephone Encounter (Signed)
Insulin was sent to [facility] on 01/18/2014 with 10 mL and 4 RF's, we received confirmation from them the same day. However, I have resent his insulin into [facility] with 10 mL and 4 RFs. Synthroid and fenofibrate refilled with # 90 day supply and 1 RF.

## 2014-02-22 NOTE — Telephone Encounter (Signed)
Patient called in to request a refill on his insulin and pharmacy has not heard back, they have given him one pin so he does not run out.   Also, he would like to request 90 day supply on all his medications due to pricing

## 2014-02-28 ENCOUNTER — Encounter: Payer: Self-pay | Admitting: Internal Medicine

## 2014-02-28 ENCOUNTER — Ambulatory Visit (INDEPENDENT_AMBULATORY_CARE_PROVIDER_SITE_OTHER): Payer: Medicare Other | Admitting: Internal Medicine

## 2014-02-28 ENCOUNTER — Encounter (HOSPITAL_BASED_OUTPATIENT_CLINIC_OR_DEPARTMENT_OTHER): Payer: Self-pay

## 2014-02-28 ENCOUNTER — Ambulatory Visit (HOSPITAL_BASED_OUTPATIENT_CLINIC_OR_DEPARTMENT_OTHER)
Admission: RE | Admit: 2014-02-28 | Discharge: 2014-02-28 | Disposition: A | Discharge status: Home / Self Care | Payer: Medicare Other | Source: Ambulatory Visit | Attending: Internal Medicine | Admitting: Internal Medicine

## 2014-02-28 VITALS — BP 136/68 | HR 64 | Temp 97.8°F | Wt 186.5 lb

## 2014-02-28 DIAGNOSIS — E038 Other specified hypothyroidism: Secondary | ICD-10-CM | POA: Diagnosis not present

## 2014-02-28 DIAGNOSIS — I4891 Unspecified atrial fibrillation: Secondary | ICD-10-CM | POA: Diagnosis not present

## 2014-02-28 DIAGNOSIS — E033 Postinfectious hypothyroidism: Secondary | ICD-10-CM

## 2014-02-28 DIAGNOSIS — E034 Atrophy of thyroid (acquired): Secondary | ICD-10-CM | POA: Diagnosis not present

## 2014-02-28 DIAGNOSIS — D500 Iron deficiency anemia secondary to blood loss (chronic): Secondary | ICD-10-CM

## 2014-02-28 DIAGNOSIS — E538 Deficiency of other specified B group vitamins: Secondary | ICD-10-CM

## 2014-02-28 DIAGNOSIS — R059 Cough, unspecified: Secondary | ICD-10-CM

## 2014-02-28 DIAGNOSIS — M8580 Other specified disorders of bone density and structure, unspecified site: Secondary | ICD-10-CM | POA: Diagnosis not present

## 2014-02-28 DIAGNOSIS — E118 Type 2 diabetes mellitus with unspecified complications: Secondary | ICD-10-CM | POA: Diagnosis not present

## 2014-02-28 DIAGNOSIS — Z23 Encounter for immunization: Secondary | ICD-10-CM | POA: Diagnosis not present

## 2014-02-28 DIAGNOSIS — R05 Cough: Secondary | ICD-10-CM

## 2014-02-28 DIAGNOSIS — Z0000 Encounter for general adult medical examination without abnormal findings: Secondary | ICD-10-CM

## 2014-02-28 DIAGNOSIS — N400 Enlarged prostate without lower urinary tract symptoms: Secondary | ICD-10-CM

## 2014-02-28 LAB — POCT INR: INR: 2.5

## 2014-02-28 NOTE — Assessment & Plan Note (Signed)
Continue with Synthroid 88 mcg, check a TSH

## 2014-02-28 NOTE — Progress Notes (Signed)
Pre visit review using our clinic review tool, if applicable. No additional management support is needed unless otherwise documented below in the visit note. 

## 2014-02-28 NOTE — Assessment & Plan Note (Signed)
On OTC supplements, labs 

## 2014-02-28 NOTE — Assessment & Plan Note (Signed)
Doing well, essentially asymptomatic

## 2014-02-28 NOTE — Assessment & Plan Note (Addendum)
Last A1c 9.1, would like it to see A1c closer to 8.0. He has symptoms consistent with low blood sugar around  once a month  And he lives by himself consequently it may be difficult to get him at goal without increasing his risk of hypoglycemia. Furthermore, he is unable to afford insulins such as Lantus. Plan: Check A1c

## 2014-02-28 NOTE — Assessment & Plan Note (Signed)
Checking a CBC today

## 2014-02-28 NOTE — Progress Notes (Signed)
Subjective:    Patient ID: [name], male    DOB: [birth year], 80 y.o.   MRN: [id number]  DOS:  02/28/2014 Type of visit - description :  Interval history:  1. Risk factors based on Past M, S, F history: reviewed 2. Physical Activities:  not very active, DOes  NOT  like to engage in any physical activity  3. Depression/mood: (-) screening   4. Hearing: slightly  decreased, stable, no tinnitus, occ problems -- declined audiologist referral  5. ADL's: totally independent   6. Fall Risk: had 1-2 falls over last year, at home, no syncope, counseled, rec PT, declined   7. Home Safety: does feel safe at home   8. Height, weight, &visual acuity: see VS,see eye doctor regularly,s/p cataract surgery , h/o glaucoma, sees eye MD regualrly 9. Counseling: yes, see below 10. Labs ordered based on risk factors: yes 11. Referral Coordination, if needed 12. Care Plan: See assessment and plan 13.Cognitive Assessment: motor skills slt decreased d/t inactivity, see above ; memory and cognition seem appropiate   in addition, we discussed the following Atrial fibrillation, on Coumadin, due for an INR B12 deficiency on by mouth supplements CAD, good medication compliance, has nitroglycerin, has never used it. Diabetes, good medication compliance, ambulatory CBGs around 124. Very seldom, maybe once a month he gets a CBG in the 80s with symptoms, No loss of consciousness. No particular time of the day that happened. Hypothyroidism, good compliance of medicines    ROS Denies chest pain, difficulty breathing or lower extremity edema No nausea, vomiting, diarrhea blood in the stools No heartburn, dysphagia or odynophagia but several times in the he has coughing spells associated with food intake, does not have a choking feeling. No dysuria, gross hematuria or difficulty urinating  Past Medical History  Diagnosis Date  . Diabetes mellitus, type 2   . CAD (coronary artery disease)   . Hypertension     . Hyperlipidemia   . Atrial fibrillation   . AAA (abdominal aortic aneurysm)   . Hypothyroidism   . BPH (benign prostatic hyperplasia)     reports aprocedure (TURP) remotely in [place].Marland KitchenNo futher f/u w/ urology  . GERD (gastroesophageal reflux disease)   . Insomnia     transient  . Vitamin B12 deficiency   . Osteopenia     dexa 2-11  . Diverticulosis     left colon  . Erosive gastritis   . Anemia due to chronic blood loss 11/25/2008    Recurrent over the years EGD and colonoscopy x 2 each 2004 and 2010 without cause      Past Surgical History  Procedure Laterality Date  . Pacemaker  1980, 1995, 2013    medtronic minix 8341  . Cholecystectomy, laparoscopic    . Prostatectomy      transurethral  . Inguinal herniorrhaphies      bilateral  . Right hip replacement    . Coronary artery bypass graft      1999 stents in 2000  . Penile prosthesis implant  1992  . Transthoracic echocardiogram  12/2006  . Esophagogastroduodenoscopy      multiple  . Colonoscopy      multiple  . Cataract extraction      right  . Cardiac catheterization  01/31/06, 09/25/10, 09-2011  . Givens capsule study N/A 11/04/2012    Procedure: GIVENS CAPSULE STUDY;  Surgeon: Gatha Mayer, MD;  Location: [facility];  Service: Endoscopy;  Laterality: N/A;    History  Social History  . Marital Status: Legally Separated    Spouse Name: N/A    Number of Children: 5  . Years of Education: N/A   Occupational History  . retired    Social History Main Topics  . Smoking status: Never Smoker   . Smokeless tobacco: Never Used  . Alcohol Use: 0.6 - 1.2 oz/week    1-2 Glasses of wine per week     Comment: socially   . Drug Use: No  . Sexual Activity: No   Other Topics Concern  . Not on file   Social History Narrative   Lives by self, independent on ADL, retired. Separated from wife.    Daughters:   Leigh ( lives in [place]) [phone]   Sherri ( lives out of town)  [phone]           Family  History  Problem Relation Age of Onset  . Alzheimer's disease Mother   . Heart failure Father   . Stroke Father     cva  . Diabetes Other     several siblings  . Coronary artery disease Brother     cabg  . Alzheimer's disease Sister   . Mental illness Sister     alzheimers  . Prostate cancer Neg Hx   . Colon cancer Neg Hx   . Esophageal cancer Neg Hx   . Stomach cancer Neg Hx   . Rectal cancer Neg Hx        Medication List       This list is accurate as of: 02/28/14 11:59 PM.  Always use your most recent med list.               aspirin 81 MG tablet  Take 81 mg by mouth daily.     atenolol 50 MG tablet  Commonly known as:  TENORMIN  TAKE ONE TABLET BY MOUTH TWICE DAILY     B-12 1000 MCG Caps  Take 1 capsule by mouth daily.     bd getting started take home kit Misc  1 kit by Other route once.     digoxin 0.125 MG tablet  Commonly known as:  LANOXIN  TAKE ONE TABLET BY MOUTH ONCE DAILY     EASY TOUCH INSULIN SYRINGE 31G X 5/16" 1 ML Misc  Generic drug:  Insulin Syringe-Needle U-100  USE AS DIRECTED WITH NPH INSULIN     fenofibrate micronized 134 MG capsule  Commonly known as:  LOFIBRA  TAKE ONE CAPSULE BY MOUTH ONCE DAILY     ferrous sulfate 325 (65 FE) MG EC tablet  Take 1 tablet (325 mg total) by mouth daily.     glucose blood test strip  Use as instructed     glucose monitoring kit monitoring kit  1 each by Does not apply route as needed for other. For TID glucose testing     insulin NPH Human 100 UNIT/ML injection  Commonly known as:  NOVOLIN N RELION  INJECT 45 UNITS UNDER THE SKIN ONE HOUR BEFORE BREAKFAST AND INJECT 35 UNITS ONE HOUR BEFORE SUPPER     Insulin Pen Starter Kit Misc  1 kit by Other route once.     levothyroxine 88 MCG tablet  Commonly known as:  SYNTHROID, LEVOTHROID  TAKE ONE TABLET BY MOUTH ONCE DAILY     nitroGLYCERIN 0.4 MG SL tablet  Commonly known as:  NITROSTAT  Place 0.4 mg under the tongue every 5 (five) minutes  as needed. For   chest pain     omeprazole 40 MG capsule  Commonly known as:  PRILOSEC  Take 1 capsule (40 mg total) by mouth daily before breakfast. For acid reflux     onetouch ultrasoft lancets  Use as instructed     pravastatin 40 MG tablet  Commonly known as:  PRAVACHOL  TAKE TWO TABLETS BY MOUTH ONCE DAILY     warfarin 3 MG tablet  Commonly known as:  COUMADIN  TAKE AS DIRECTED           Objective:   Physical Exam BP 136/68  Pulse 64  Temp(Src) 97.8 F (36.6 C) (Oral)  Wt 186 lb 8 oz (84.596 kg)  SpO2 97% General -- alert, well-developed, NAD.  Neck --no thyromegaly  HEENT-- Not pale.   Lungs -- normal respiratory effort, no intercostal retractions, no accessory muscle use, and normal breath sounds.  Heart-- irreg .  Abdomen-- Not distended, good bowel sounds,soft, non-tender. Rectal-- + external skin tags  noted.   No rectal masses or tenderness. No stools found Prostate--Prostate gland firm and smooth, slt  enlargement, no nodularity, tenderness, mass, asymmetry or induration. Extremities-- no pretibial edema bilaterally  Neurologic--  alert & oriented X3. Speech normal, gait appropriate for age, strength symmetric and appropriate for age.  Psych-- Cognition and judgment appear intact. Cooperative with normal attention span and concentration. No anxious or depressed appearing.      Assessment & Plan:   F2F 52 min Today I spent > 52 minutes with the patient, most of it counseling ; the patient declined multiple suggestions such as engage in a  Exercise program , a swallowing study, bone density test. The benefits of all these interventions were discussed at length with the patient.   

## 2014-02-28 NOTE — Assessment & Plan Note (Signed)
Seems well-controlled, no change 

## 2014-02-28 NOTE — Assessment & Plan Note (Signed)
Currently asymptomatic, plan is to continue controlling his cardiovascular risk factors

## 2014-02-28 NOTE — Assessment & Plan Note (Addendum)
Last bone density test 2011, patient decided not to be physically active. Recommend calcium, vitamin D and offered a  bone density test. Declined DEXA

## 2014-02-28 NOTE — Assessment & Plan Note (Addendum)
Td 2009 pneumonia shot 11-08 prevnar -- on RTC, we don't have it today Flu shot today  shingles shot  rx provided before, never got it  Cscope 2004, diverticuli; repeated colonoscopy 8- 2010   showed diverticula.  80 year old gentleman with multiple medical problems, PSAs stable over time, last 2012; Pros-cons of  screening discussed, he likes to be screening. DRE today benign, check a PSA  Strongly declined to get physically active

## 2014-02-28 NOTE — Assessment & Plan Note (Addendum)
Rate controlled , check a dig levell INR 2.5, current Coumadin 3 mg daily except Monday date 1.5 tablets. INR in one month

## 2014-02-28 NOTE — Patient Instructions (Signed)
Get your blood work before you leave   Stop by the first floor and get the XR    same coumadin, recheck in 1 month  Please come back to the office in 3-4 months for a routine check up  No fasting        Fall Prevention and Home Safety Falls cause injuries and can affect all age groups. It is possible to use preventive measures to significantly decrease the likelihood of falls. There are many simple measures which can make your home safer and prevent falls. OUTDOORS  Repair cracks and edges of walkways and driveways.  Remove high doorway thresholds.  Trim shrubbery on the main path into your home.  Have good outside lighting.  Clear walkways of tools, rocks, debris, and clutter.  Check that handrails are not broken and are securely fastened. Both sides of steps should have handrails.  Have leaves, snow, and ice cleared regularly.  Use sand or salt on walkways during winter months.  In the garage, clean up grease or oil spills. BATHROOM  Install night lights.  Install grab bars by the toilet and in the tub and shower.  Use non-skid mats or decals in the tub or shower.  Place a plastic non-slip stool in the shower to sit on, if needed.  Keep floors dry and clean up all water on the floor immediately.  Remove soap buildup in the tub or shower on a regular basis.  Secure bath mats with non-slip, double-sided rug tape.  Remove throw rugs and tripping hazards from the floors. BEDROOMS  Install night lights.  Make sure a bedside light is easy to reach.  Do not use oversized bedding.  Keep a telephone by your bedside.  Have a firm chair with side arms to use for getting dressed.  Remove throw rugs and tripping hazards from the floor. KITCHEN  Keep handles on pots and pans turned toward the center of the stove. Use back burners when possible.  Clean up spills quickly and allow time for drying.  Avoid walking on wet floors.  Avoid hot utensils and  knives.  Position shelves so they are not too high or low.  Place commonly used objects within easy reach.  If necessary, use a sturdy step stool with a grab bar when reaching.  Keep electrical cables out of the way.  Do not use floor polish or wax that makes floors slippery. If you must use wax, use non-skid floor wax.  Remove throw rugs and tripping hazards from the floor. STAIRWAYS  Never leave objects on stairs.  Place handrails on both sides of stairways and use them. Fix any loose handrails. Make sure handrails on both sides of the stairways are as long as the stairs.  Check carpeting to make sure it is firmly attached along stairs. Make repairs to worn or loose carpet promptly.  Avoid placing throw rugs at the top or bottom of stairways, or properly secure the rug with carpet tape to prevent slippage. Get rid of throw rugs, if possible.  Have an electrician put in a light switch at the top and bottom of the stairs. OTHER FALL PREVENTION TIPS  Wear low-heel or rubber-soled shoes that are supportive and fit well. Wear closed toe shoes.  When using a stepladder, make sure it is fully opened and both spreaders are firmly locked. Do not climb a closed stepladder.  Add color or contrast paint or tape to grab bars and handrails in your home. Place contrasting color strips  on first and last steps.  Learn and use mobility aids as needed. Install an electrical emergency response system.  Turn on lights to avoid dark areas. Replace light bulbs that burn out immediately. Get light switches that glow.  Arrange furniture to create clear pathways. Keep furniture in the same place.  Firmly attach carpet with non-skid or double-sided tape.  Eliminate uneven floor surfaces.  Select a carpet pattern that does not visually hide the edge of steps.  Be aware of all pets. OTHER HOME SAFETY TIPS  Set the water temperature for 120 F (48.8 C).  Keep emergency numbers on or near the  telephone.  Keep smoke detectors on every level of the home and near sleeping areas. Document Released: 04/19/2002 Document Revised: 10/29/2011 Document Reviewed: 07/19/2011 [facility] Patient Information 2015 [place], [place]. This information is not intended to replace advice given to you by your health care provider. Make sure you discuss any questions you have with your health care provider.

## 2014-03-01 ENCOUNTER — Ambulatory Visit: Payer: Medicare Other

## 2014-03-01 LAB — CBC WITH DIFFERENTIAL/PLATELET
Basophils Absolute: 0 10*3/uL (ref 0.0–0.1)
Basophils Relative: 0.6 % (ref 0.0–3.0)
EOS ABS: 0.2 10*3/uL (ref 0.0–0.7)
EOS PCT: 3 % (ref 0.0–5.0)
HCT: 43.4 % (ref 39.0–52.0)
Hemoglobin: 14.6 g/dL (ref 13.0–17.0)
Lymphocytes Relative: 16 % (ref 12.0–46.0)
Lymphs Abs: 1 10*3/uL (ref 0.7–4.0)
MCHC: 33.7 g/dL (ref 30.0–36.0)
MCV: 98.7 fl (ref 78.0–100.0)
MONO ABS: 0.5 10*3/uL (ref 0.1–1.0)
Monocytes Relative: 8.2 % (ref 3.0–12.0)
Neutro Abs: 4.3 10*3/uL (ref 1.4–7.7)
Neutrophils Relative %: 72.2 % (ref 43.0–77.0)
Platelets: 193 10*3/uL (ref 150.0–400.0)
RBC: 4.4 Mil/uL (ref 4.22–5.81)
RDW: 14.2 % (ref 11.5–15.5)
WBC: 6 10*3/uL (ref 4.0–10.5)

## 2014-03-01 LAB — PSA: PSA: 2.59 ng/mL (ref 0.10–4.00)

## 2014-03-01 LAB — DIGOXIN LEVEL: Digoxin Level: 0.7 ng/mL — ABNORMAL LOW (ref 0.8–2.0)

## 2014-03-01 LAB — HEMOGLOBIN A1C: HEMOGLOBIN A1C: 8.2 % — AB (ref 4.6–6.5)

## 2014-03-01 LAB — TSH: TSH: 1.13 u[IU]/mL (ref 0.35–4.50)

## 2014-03-01 NOTE — Assessment & Plan Note (Signed)
Cough associated with food intake, recommend a swallow study and a chest x-ray. Declined swallow study

## 2014-03-02 ENCOUNTER — Ambulatory Visit (INDEPENDENT_AMBULATORY_CARE_PROVIDER_SITE_OTHER): Payer: Medicare Other

## 2014-03-02 DIAGNOSIS — Z23 Encounter for immunization: Secondary | ICD-10-CM

## 2014-03-29 ENCOUNTER — Other Ambulatory Visit: Payer: Self-pay

## 2014-03-29 MED ORDER — PRAVASTATIN SODIUM 40 MG PO TABS: 80.0000 mg | ORAL_TABLET | Freq: Every day | ORAL | Status: DC

## 2014-03-29 MED ORDER — WARFARIN SODIUM 3 MG PO TABS: ORAL_TABLET | ORAL | Status: DC

## 2014-03-31 ENCOUNTER — Other Ambulatory Visit: Payer: Self-pay

## 2014-03-31 MED ORDER — INSULIN NPH (HUMAN) (ISOPHANE) 100 UNIT/ML ~~LOC~~ SUSP: SUBCUTANEOUS | Status: DC

## 2014-04-05 ENCOUNTER — Other Ambulatory Visit: Payer: Self-pay

## 2014-04-05 MED ORDER — INSULIN NPH (HUMAN) (ISOPHANE) 100 UNIT/ML ~~LOC~~ SUSP: SUBCUTANEOUS | Status: DC

## 2014-04-18 ENCOUNTER — Ambulatory Visit (INDEPENDENT_AMBULATORY_CARE_PROVIDER_SITE_OTHER): Payer: Medicare Other | Admitting: Internal Medicine

## 2014-04-18 ENCOUNTER — Encounter: Payer: Self-pay | Admitting: Internal Medicine

## 2014-04-18 VITALS — BP 140/72 | HR 60 | Ht 67.0 in | Wt 186.0 lb

## 2014-04-18 DIAGNOSIS — I4891 Unspecified atrial fibrillation: Secondary | ICD-10-CM | POA: Diagnosis not present

## 2014-04-18 NOTE — Progress Notes (Signed)
HPI Pateint is an 80 yo who was previously followed by [name]  Hx of CAD, AAA, atrial fib  He is also followed byG Lovena Frank for PPM He was last seen by [name] in Jan 2014   Doing good  No CP  No SOB  No dizziness  Allergies  Allergen Reactions  . Ace Inhibitors     REACTION: cough  . Codeine Phosphate     REACTION: nausea  . Hydrochlorothiazide W-Triamterene     REACTION: cramps    Current Outpatient Prescriptions  Medication Sig Dispense Refill  . aspirin 81 MG tablet Take 81 mg by mouth daily.    [name] atenolol (TENORMIN) 50 MG tablet TAKE ONE TABLET BY MOUTH TWICE DAILY 180 tablet 3  . bd getting started take home kit MISC 1 kit by Other route once. 1 kit 0  . Cyanocobalamin (B-12) 1000 MCG CAPS Take 1 capsule by mouth daily. 30 capsule 11  . digoxin (LANOXIN) 0.125 MG tablet TAKE ONE TABLET BY MOUTH ONCE DAILY 90 tablet 2  . EASY TOUCH INSULIN SYRINGE 31G X 5/16" 1 ML MISC USE AS DIRECTED WITH NPH INSULIN 100 each 5  . fenofibrate micronized (LOFIBRA) 134 MG capsule TAKE ONE CAPSULE BY MOUTH ONCE DAILY 90 capsule 1  . ferrous sulfate 325 (65 FE) MG EC tablet Take 1 tablet (325 mg total) by mouth daily. 30 tablet 11  . Flexpen Starter Kit MISC 1 kit by Other route once. 1 kit 0  . glucose blood test strip Use as instructed 100 each 12  . glucose monitoring kit (FREESTYLE) monitoring kit 1 each by Does not apply route as needed for other. For TID glucose testing 1 each 0  . insulin NPH Human (NOVOLIN N RELION) 100 UNIT/ML injection INJECT 45 UNITS UNDER THE SKIN ONE HOUR BEFORE BREAKFAST AND INJECT 35 UNITS ONE HOUR BEFORE SUPPER 30 mL 2  . Lancets (ONETOUCH ULTRASOFT) lancets Use as instructed 100 each 12  . levothyroxine (SYNTHROID, LEVOTHROID) 88 MCG tablet TAKE ONE TABLET BY MOUTH ONCE DAILY 90 tablet 1  . nitroGLYCERIN (NITROSTAT) 0.4 MG SL tablet Place 0.4 mg under the tongue every 5 (five) minutes as needed. For chest pain    . omeprazole (PRILOSEC) 40 MG capsule Take 1  capsule (40 mg total) by mouth daily before breakfast. For acid reflux    . pravastatin (PRAVACHOL) 40 MG tablet Take 2 tablets (80 mg total) by mouth daily. 180 tablet 1  . warfarin (COUMADIN) 3 MG tablet TAKE AS DIRECTED 90 tablet 0   No current facility-administered medications for this visit.    Past Medical History  Diagnosis Date  . Diabetes mellitus, type 2   . CAD (coronary artery disease)   . Hypertension   . Hyperlipidemia   . Atrial fibrillation   . AAA (abdominal aortic aneurysm)   . Hypothyroidism   . BPH (benign prostatic hyperplasia)     reports aprocedure (TURP) remotely in [place].Marland KitchenNo futher f/u w/ urology  . GERD (gastroesophageal reflux disease)   . Insomnia     transient  . Vitamin B12 deficiency   . Osteopenia     dexa 2-11  . Diverticulosis     left colon  . Erosive gastritis   . Anemia due to chronic blood loss 11/25/2008    Recurrent over the years EGD and colonoscopy x 2 each 2004 and 2010 without cause      Past Surgical History  Procedure Laterality Date  . Pacemaker  [place], 2013    medtronic minix 8341  . Cholecystectomy, laparoscopic    . Prostatectomy      transurethral  . Inguinal herniorrhaphies      bilateral  . Right hip replacement    . Coronary artery bypass graft      1999 stents in 2000  . Penile prosthesis implant  1992  . Transthoracic echocardiogram  12/2006  . Esophagogastroduodenoscopy      multiple  . Colonoscopy      multiple  . Cataract extraction      right  . Cardiac catheterization  01/31/06, 09/25/10, 09-2011  . Givens capsule study N/A 11/04/2012    Procedure: GIVENS CAPSULE STUDY;  Surgeon: Gatha Mayer, MD;  Location: [facility];  Service: Endoscopy;  Laterality: N/A;    Family History  Problem Relation Age of Onset  . Alzheimer's disease Mother   . Heart failure Father   . Stroke Father     cva  . Diabetes Other     several siblings  . Coronary artery disease Brother     cabg  . Alzheimer's disease  Sister   . Mental illness Sister     alzheimers  . Prostate cancer Neg Hx   . Colon cancer Neg Hx   . Esophageal cancer Neg Hx   . Stomach cancer Neg Hx   . Rectal cancer Neg Hx     History   Social History  . Marital Status: Legally Separated    Spouse Name: N/A    Number of Children: 5  . Years of Education: N/A   Occupational History  . retired    Social History Main Topics  . Smoking status: Never Smoker   . Smokeless tobacco: Never Used  . Alcohol Use: 0.6 - 1.2 oz/week    1-2 Glasses of wine per week     Comment: socially   . Drug Use: No  . Sexual Activity: No   Other Topics Concern  . Not on file   Social History Narrative   Lives by self, independent on ADL, retired. Separated from wife.    Daughters:   [name] ( lives in [place]) [phone]   [name] ( lives out of town)  [phone]          Review of Systems:  All systems reviewed.  They are negative to the above problem except as previously stated.  Vital Signs: BP 140/72 mmHg  Pulse 60  Ht _0  (1.702 m)  Wt 186 lb (84.369 kg)  BMI 29.12 kg/m2  Physical Exam  HEENT:  Normocephalic, atraumatic. EOMI, PERRLA.  Neck: JVP is normal.  No bruits.  Lungs: clear to auscultation. No rales no wheezes.  Heart: Regular rate and rhythm. Normal S1, S2. No S3.   No significant murmurs. PMI not displaced.  Abdomen:  Supple, nontender. Normal bowel sounds. No masses. No hepatomegaly.  Extremities:   Good distal pulses throughout. No lower extremity edema.  Musculoskeletal :moving all extremities.  Neuro:   alert and oriented x3.  CN II-XII grossly intact.  EKG  Ventricular paced 60 bpm    Assessment and Plan: 1.  CAD  Asymtomatic   2.   Afib  Continue anticoagulation  3.  PPM  F/U with [name]  4.  HL  Last lipids LDLwas very good  HDL low and Trig high  Watch  carbs  Continue current Rx

## 2014-04-18 NOTE — Patient Instructions (Signed)
Your physician recommends that you continue on your current medications as directed. Please refer to the Current Medication list given to you today. Your physician wants you to follow-up in: 1 year with Dr. Ross.  You will receive a reminder letter in the mail two months in advance. If you don't receive a letter, please call our office to schedule the follow-up appointment.  

## 2014-04-21 ENCOUNTER — Encounter (HOSPITAL_COMMUNITY): Payer: Self-pay | Admitting: Internal Medicine

## 2014-05-01 ENCOUNTER — Telehealth: Payer: Self-pay | Admitting: Internal Medicine

## 2014-05-01 NOTE — Telephone Encounter (Signed)
Pt is overdue for an INR, please arrange

## 2014-05-02 NOTE — Telephone Encounter (Signed)
Please call Pt, inform him he is overdue for INR, needs to be checked this week if possible. Nurse visit only.

## 2014-05-04 NOTE — Telephone Encounter (Signed)
Noted  

## 2014-05-04 NOTE — Telephone Encounter (Signed)
ok 

## 2014-05-04 NOTE — Telephone Encounter (Signed)
Pt scheduled INR appointment for 05/11/2014

## 2014-05-11 ENCOUNTER — Ambulatory Visit (INDEPENDENT_AMBULATORY_CARE_PROVIDER_SITE_OTHER): Payer: Medicare Other

## 2014-05-11 VITALS — BP 132/69 | HR 61 | Temp 98.1°F | Wt 183.6 lb

## 2014-05-11 DIAGNOSIS — I4891 Unspecified atrial fibrillation: Secondary | ICD-10-CM | POA: Diagnosis not present

## 2014-05-11 DIAGNOSIS — Z7901 Long term (current) use of anticoagulants: Secondary | ICD-10-CM

## 2014-05-11 LAB — POCT INR: INR: 2.1

## 2014-05-11 NOTE — Progress Notes (Signed)
Pre visit review using our clinic review tool, if applicable. No additional management support is needed unless otherwise documented below in the visit note. 

## 2014-05-11 NOTE — Patient Instructions (Signed)
Continue to take 3mg  all days except on Monday 4.5 mg. Recheck in 6 weeks.

## 2014-06-22 ENCOUNTER — Ambulatory Visit (INDEPENDENT_AMBULATORY_CARE_PROVIDER_SITE_OTHER): Payer: Medicare Other | Admitting: *Deleted

## 2014-06-22 VITALS — BP 120/70 | HR 97 | Temp 97.8°F | Resp 16 | Wt 185.0 lb

## 2014-06-22 DIAGNOSIS — I482 Chronic atrial fibrillation, unspecified: Secondary | ICD-10-CM

## 2014-06-22 LAB — POCT INR: INR: 2.5

## 2014-06-22 NOTE — Patient Instructions (Signed)
Continue to take 3 mg all days except on Monday 4.5 mg. Recheck in 6 weeks.

## 2014-06-22 NOTE — Progress Notes (Signed)
Pre visit review using our clinic review tool, if applicable. No additional management support is needed unless otherwise documented below in the visit note. 

## 2014-07-01 ENCOUNTER — Other Ambulatory Visit (HOSPITAL_COMMUNITY): Payer: Self-pay | Admitting: Cardiology

## 2014-07-01 DIAGNOSIS — I714 Abdominal aortic aneurysm, without rupture, unspecified: Secondary | ICD-10-CM

## 2014-07-06 ENCOUNTER — Ambulatory Visit (HOSPITAL_COMMUNITY): Payer: Medicare Other | Attending: Cardiovascular Disease | Admitting: Cardiology

## 2014-07-06 DIAGNOSIS — E785 Hyperlipidemia, unspecified: Secondary | ICD-10-CM | POA: Diagnosis not present

## 2014-07-06 DIAGNOSIS — I714 Abdominal aortic aneurysm, without rupture, unspecified: Secondary | ICD-10-CM

## 2014-07-06 DIAGNOSIS — Z951 Presence of aortocoronary bypass graft: Secondary | ICD-10-CM | POA: Diagnosis not present

## 2014-07-06 DIAGNOSIS — I10 Essential (primary) hypertension: Secondary | ICD-10-CM | POA: Diagnosis not present

## 2014-07-06 DIAGNOSIS — I2510 Atherosclerotic heart disease of native coronary artery without angina pectoris: Secondary | ICD-10-CM | POA: Diagnosis not present

## 2014-07-06 DIAGNOSIS — E119 Type 2 diabetes mellitus without complications: Secondary | ICD-10-CM | POA: Diagnosis not present

## 2014-07-06 NOTE — Progress Notes (Signed)
Aorto-iliac duplex performed 

## 2014-07-07 ENCOUNTER — Ambulatory Visit (INDEPENDENT_AMBULATORY_CARE_PROVIDER_SITE_OTHER): Payer: Medicare Other | Admitting: *Deleted

## 2014-07-07 DIAGNOSIS — I482 Chronic atrial fibrillation, unspecified: Secondary | ICD-10-CM

## 2014-07-07 DIAGNOSIS — Z950 Presence of cardiac pacemaker: Secondary | ICD-10-CM | POA: Diagnosis not present

## 2014-07-07 LAB — MDC_IDC_ENUM_SESS_TYPE_INCLINIC
Lead Channel Impedance Value: 545 Ohm
Lead Channel Pacing Threshold Amplitude: 0.75 V
Lead Channel Sensing Intrinsic Amplitude: 11.2 mV
Lead Channel Setting Pacing Amplitude: 2.5 V
Lead Channel Setting Pacing Pulse Width: 0.4 ms
Lead Channel Setting Sensing Sensitivity: 4 mV
MDC IDC MSMT BATTERY IMPEDANCE: 892 Ohm
MDC IDC MSMT BATTERY REMAINING LONGEVITY: 64 mo
MDC IDC MSMT BATTERY VOLTAGE: 2.76 V
MDC IDC MSMT LEADCHNL RA IMPEDANCE VALUE: 0 Ohm
MDC IDC MSMT LEADCHNL RV PACING THRESHOLD PULSEWIDTH: 0.4 ms
MDC IDC SESS DTM: 20160225164427
MDC IDC STAT BRADY RV PERCENT PACED: 76 %

## 2014-07-07 NOTE — Progress Notes (Signed)
Pacemaker check in clinic. Normal device function. Threshold, sensing, impedances consistent with previous measurements. Device programmed to maximize longevity. 1 high ventricular rate noted---irreg R-R + coumadin. Device programmed at appropriate safety margins. Histogram distribution blunted---turned on sensor. Device programmed to optimize intrinsic conduction---set max sensor to 125bpm. Estimated longevity 5.49yrs. ROV w/ Dr. Lovena Le in 67mo.

## 2014-07-15 ENCOUNTER — Other Ambulatory Visit: Payer: Self-pay | Admitting: Internal Medicine

## 2014-07-19 ENCOUNTER — Encounter: Payer: Self-pay | Admitting: Internal Medicine

## 2014-07-30 ENCOUNTER — Other Ambulatory Visit: Payer: Self-pay | Admitting: Internal Medicine

## 2014-08-01 DIAGNOSIS — H4011X3 Primary open-angle glaucoma, severe stage: Secondary | ICD-10-CM | POA: Diagnosis not present

## 2014-08-03 ENCOUNTER — Ambulatory Visit (INDEPENDENT_AMBULATORY_CARE_PROVIDER_SITE_OTHER): Payer: Medicare Other | Admitting: *Deleted

## 2014-08-03 VITALS — BP 138/71 | HR 83 | Temp 97.9°F | Resp 16 | Wt 180.6 lb

## 2014-08-03 DIAGNOSIS — I482 Chronic atrial fibrillation, unspecified: Secondary | ICD-10-CM

## 2014-08-03 LAB — POCT INR: INR: 2.2

## 2014-08-03 NOTE — Progress Notes (Signed)
Pre visit review using our clinic review tool, if applicable. No additional management support is needed unless otherwise documented below in the visit note. 

## 2014-08-03 NOTE — Patient Instructions (Signed)
Continue to take 3 mg all days except on Monday 4.5 mg. Recheck in 6 weeks.

## 2014-08-17 ENCOUNTER — Other Ambulatory Visit: Payer: Self-pay

## 2014-09-05 ENCOUNTER — Other Ambulatory Visit: Payer: Self-pay | Admitting: Internal Medicine

## 2014-09-13 ENCOUNTER — Encounter: Payer: Self-pay | Admitting: Internal Medicine

## 2014-09-13 ENCOUNTER — Other Ambulatory Visit: Payer: Self-pay

## 2014-09-13 ENCOUNTER — Ambulatory Visit (INDEPENDENT_AMBULATORY_CARE_PROVIDER_SITE_OTHER): Payer: Medicare Other | Admitting: Internal Medicine

## 2014-09-13 VITALS — BP 124/68 | HR 88 | Temp 97.8°F | Ht 67.0 in | Wt 187.0 lb

## 2014-09-13 DIAGNOSIS — E039 Hypothyroidism, unspecified: Secondary | ICD-10-CM

## 2014-09-13 DIAGNOSIS — I4891 Unspecified atrial fibrillation: Secondary | ICD-10-CM

## 2014-09-13 DIAGNOSIS — E118 Type 2 diabetes mellitus with unspecified complications: Secondary | ICD-10-CM

## 2014-09-13 DIAGNOSIS — E119 Type 2 diabetes mellitus without complications: Secondary | ICD-10-CM

## 2014-09-13 DIAGNOSIS — K219 Gastro-esophageal reflux disease without esophagitis: Secondary | ICD-10-CM | POA: Diagnosis not present

## 2014-09-13 DIAGNOSIS — I10 Essential (primary) hypertension: Secondary | ICD-10-CM

## 2014-09-13 DIAGNOSIS — J300 Vasomotor rhinitis: Secondary | ICD-10-CM | POA: Diagnosis not present

## 2014-09-13 DIAGNOSIS — K2960 Other gastritis without bleeding: Secondary | ICD-10-CM

## 2014-09-13 LAB — POCT INR: INR: 3.7

## 2014-09-13 MED ORDER — AZELASTINE HCL 0.1 % NA SOLN: 2.0000 | Freq: Every evening | NASAL | Status: DC | PRN

## 2014-09-13 MED ORDER — OMEPRAZOLE 40 MG PO CPDR: 40.0000 mg | DELAYED_RELEASE_CAPSULE | Freq: Every day | ORAL | Status: DC

## 2014-09-13 NOTE — Progress Notes (Signed)
Pre visit review using our clinic review tool, if applicable. No additional management support is needed unless otherwise documented below in the visit note. 

## 2014-09-13 NOTE — Progress Notes (Signed)
Subjective:    Patient ID: [name], male    DOB: March 28, 1926, 81 y.o.   MRN: [id number]  DOS:  09/13/2014 Type of visit - description : rov Interval history:  Patient is a 81 year old male with history of HTN, CAD, AFib, hypothyroidism, diabetes, BPH, and osteopenia in today for routine medical care.  Patient notes his hypertension is well controlled. No recent ambulatory BPs, BP today is very good. Denies headaches or chest pain.  Hypothyroidism is well controlled on current medications, denies constipation/diarrhea and any significant weight changes.  Diabetes is monitored with regular CBG checks. Patient notes sugars running between 100-167 ; very infrequently has episodes that resemble hypoglycemia, approximately every 6 months, symptoms improve with food intake.     AFib has been controlled with an INR between 2-3 for multiple years. Patient notes good adherence to coumadin regimen.   CAD asymptomatic recently. Patient denies chest pain/SOB/palpitations.   Patient does note he has been taking his daughter's Prilosec for recent bouts of acid reflux. When he is unable to obtain it he notices increased reflux and takes Tums for relief. Denies dysphagia or odynophagia.  Patient also mentions rhinorrhea when eating. This occurs every time he is eating and occurs throughout the year. He has not tried anything for it.   Review of Systems  Constitutional: No fever, chills. Rhinorrhea when eating. Respiratory: No wheezing or difficulty breathing. Cardiovascular: No CP, leg swelling or palpitations GI: no nausea, vomiting, diarrhea or abdominal pain.  No blood in the stools. No dysphagia no odynophagia. GU: No dysuria, gross hematuria, difficulty urinating. No urinary urgency or frequency. Musculoskeletal: No joint swellings or unusual aches or pains Neurological: No dizziness or headaches.   Past Medical History  Diagnosis Date  . Diabetes mellitus, type 2   . CAD (coronary  artery disease)   . Hypertension   . Hyperlipidemia   . Atrial fibrillation   . AAA (abdominal aortic aneurysm)   . Hypothyroidism   . BPH (benign prostatic hyperplasia)     reports aprocedure (TURP) remotely in [place].Marland KitchenNo futher f/u w/ urology  . GERD (gastroesophageal reflux disease)   . Insomnia     transient  . Vitamin B12 deficiency   . Osteopenia     dexa 2-11  . Diverticulosis     left colon  . Erosive gastritis   . Anemia due to chronic blood loss 11/25/2008    Recurrent over the years EGD and colonoscopy x 2 each 2004 and 2010 without cause      Past Surgical History  Procedure Laterality Date  . Pacemaker  1980, 1995, 2013    medtronic minix 8341  . Cholecystectomy, laparoscopic    . Prostatectomy      transurethral  . Inguinal herniorrhaphies      bilateral  . Right hip replacement    . Coronary artery bypass graft      1999 stents in 2000  . Penile prosthesis implant  1992  . Transthoracic echocardiogram  12/2006  . Esophagogastroduodenoscopy      multiple  . Colonoscopy      multiple  . Cataract extraction      right  . Cardiac catheterization  01/31/06, 09/25/10, 09-2011  . Givens capsule study N/A 11/04/2012    Procedure: GIVENS CAPSULE STUDY;  Surgeon: Gatha Mayer, MD;  Location: [facility];  Service: Endoscopy;  Laterality: N/A;  . Permanent pacemaker generator change N/A 06/05/2011    Procedure: PERMANENT PACEMAKER GENERATOR CHANGE;  Surgeon: Evans Lance, MD;  Location: [facility] CATH LAB;  Service: Cardiovascular;  Laterality: N/A;  . Left heart catheterization with coronary angiogram N/A 10/04/2011    Procedure: LEFT HEART CATHETERIZATION WITH CORONARY ANGIOGRAM;  Surgeon: Burnell Blanks, MD;  Location: [facility] CATH LAB;  Service: Cardiovascular;  Laterality: N/A;    History   Social History  . Marital Status: Legally Separated    Spouse Name: N/A  . Number of Children: 5  . Years of Education: N/A   Occupational History  . retired    Social  History Main Topics  . Smoking status: Never Smoker   . Smokeless tobacco: Never Used  . Alcohol Use: 0.6 - 1.2 oz/week    1-2 Glasses of wine per week     Comment: socially   . Drug Use: No  . Sexual Activity: No   Other Topics Concern  . Not on file   Social History Narrative   Lives by self, independent on ADL, retired. Separated from wife.    Daughters:   [name] ( lives in [place]) [phone]   [name] ( lives out of town)  [phone]              Medication List       This list is accurate as of: 09/13/14 11:59 PM.  Always use your most recent med list.               aspirin 81 MG tablet  Take 81 mg by mouth daily.     atenolol 50 MG tablet  Commonly known as:  TENORMIN  Take 1 tablet (50 mg total) by mouth 2 (two) times daily.     azelastine 0.1 % nasal spray  Commonly known as:  ASTELIN  Place 2 sprays into both nostrils at bedtime as needed for rhinitis. Use in each nostril as directed     B-12 1000 MCG Caps  Take 1 capsule by mouth daily.     bd getting started take home kit Misc  1 kit by Other route once.     digoxin 0.125 MG tablet  Commonly known as:  LANOXIN  TAKE ONE TABLET BY MOUTH ONCE DAILY     EASY TOUCH INSULIN SYRINGE 31G X 5/16" 1 ML Misc  Generic drug:  Insulin Syringe-Needle U-100  USE AS DIRECTED WITH NPH INSULIN     fenofibrate micronized 134 MG capsule  Commonly known as:  LOFIBRA  Take 1 capsule (134 mg total) by mouth daily.     ferrous sulfate 325 (65 FE) MG EC tablet  Take 1 tablet (325 mg total) by mouth daily.     glucose blood test strip  Use as instructed     glucose monitoring kit monitoring kit  1 each by Does not apply route as needed for other. For TID glucose testing     insulin NPH Human 100 UNIT/ML injection  Commonly known as:  NOVOLIN N RELION  Inject 45 units under the skin one hour before breakfast and inject 35 units one hour before supper.     Insulin Pen Starter Kit Misc  1 kit by Other route  once.     levothyroxine 88 MCG tablet  Commonly known as:  SYNTHROID, LEVOTHROID  Take 1 tablet (88 mcg total) by mouth daily.     nitroGLYCERIN 0.4 MG SL tablet  Commonly known as:  NITROSTAT  Place 0.4 mg under the tongue every 5 (five) minutes as needed. For chest pain  omeprazole 40 MG capsule  Commonly known as:  PRILOSEC  Take 1 capsule (40 mg total) by mouth daily before breakfast. For acid reflux     onetouch ultrasoft lancets  Use as instructed     pravastatin 40 MG tablet  Commonly known as:  PRAVACHOL  Take 2 tablets (80 mg total) by mouth daily.     warfarin 3 MG tablet  Commonly known as:  COUMADIN  TAKE AS DIRECTED           Objective:   Physical Exam BP 124/68 mmHg  Pulse 88  Temp(Src) 97.8 F (36.6 C) (Oral)  Ht '5\' 7"'  (1.702 m)  Wt 187 lb (84.823 kg)  BMI 29.28 kg/m2  SpO2 97%  General:   Well developed, well nourished . NAD.  HEENT:  Normocephalic . Face symmetric, atraumatic Lungs:  CTA B Normal respiratory effort, no intercostal retractions, no accessory muscle use. Heart: RRR,  no murmur.  No pretibial edema bilaterally  Skin: Not pale. Not jaundice Neurologic:  alert & oriented X3.  Speech normal, gait appropriate for age and unassisted Psych--  Cognition and judgment appear intact.  Cooperative with normal attention span and concentration.  Behavior appropriate. No anxious or depressed appearing.      Assessment & Plan:    Hypertension: Well-controlled on medications, continue current regimen. Will check BMP today.  CAD: Asymptomatic, continue current regimen of medications.  Hypothyroidism: Well-controlled, continue synthroid as presently prescribed. Will recheck TSH today.   AFib: INR today was 3.7.  Given long history of well-controlled blood anticoagulation, we suspect this may be an anomaly. Blood test will be done today to confirm elevated INR and patient instructed to skip nighttime dose today. We will re-evaluate  should the INR remain high. If not, we will continue current regimen.

## 2014-09-13 NOTE — Patient Instructions (Signed)
Get your blood work before you leave    Skip the next dose of Coumadin, then continue with the same. Come back in 2 weeks for another Coumadin check  Use Astelin nose spray ---> 2 sprays in each side of the nose every night    Come back to the office in 4 months   for a routine check up

## 2014-09-14 ENCOUNTER — Ambulatory Visit: Payer: Medicare Other

## 2014-09-14 ENCOUNTER — Other Ambulatory Visit: Payer: Medicare Other

## 2014-09-14 DIAGNOSIS — J300 Vasomotor rhinitis: Secondary | ICD-10-CM | POA: Insufficient documentation

## 2014-09-14 LAB — PROTIME-INR
INR: 3.4 ratio — AB (ref 0.8–1.0)
PROTHROMBIN TIME: 36.2 s — AB (ref 9.6–13.1)

## 2014-09-14 LAB — BASIC METABOLIC PANEL
BUN: 16 mg/dL (ref 6–23)
CALCIUM: 8.6 mg/dL (ref 8.4–10.5)
CHLORIDE: 103 meq/L (ref 96–112)
CO2: 23 mEq/L (ref 19–32)
Creatinine, Ser: 1.32 mg/dL (ref 0.40–1.50)
GFR: 54.59 mL/min — ABNORMAL LOW (ref >60.00)
GLUCOSE: 161 mg/dL — AB (ref 70–99)
Potassium: 4.4 mEq/L (ref 3.5–5.1)
SODIUM: 133 meq/L — AB (ref 135–145)

## 2014-09-14 LAB — HEMOGLOBIN A1C: Hgb A1c MFr Bld: 9.2 % — ABNORMAL HIGH (ref 4.6–6.5)

## 2014-09-14 LAB — TSH: TSH: 1.1 u[IU]/mL (ref 0.35–4.50)

## 2014-09-14 MED ORDER — PRAVASTATIN SODIUM 40 MG PO TABS: 80.0000 mg | ORAL_TABLET | Freq: Every day | ORAL | Status: DC

## 2014-09-14 MED ORDER — FENOFIBRATE MICRONIZED 134 MG PO CAPS: 134.0000 mg | ORAL_CAPSULE | Freq: Every day | ORAL | Status: DC

## 2014-09-14 MED ORDER — LEVOTHYROXINE SODIUM 88 MCG PO TABS: 88.0000 ug | ORAL_TABLET | Freq: Every day | ORAL | Status: DC

## 2014-09-14 MED ORDER — ATENOLOL 50 MG PO TABS: 50.0000 mg | ORAL_TABLET | Freq: Two times a day (BID) | ORAL | Status: DC

## 2014-09-14 MED ORDER — INSULIN NPH (HUMAN) (ISOPHANE) 100 UNIT/ML ~~LOC~~ SUSP: SUBCUTANEOUS | Status: DC

## 2014-09-14 NOTE — Assessment & Plan Note (Signed)
Diabetes: on 45u and 35 u NPH insulin. Ranges between 100-167 with occasional (every 6 months) episodes of subjective hypoglycemia.  Continue current regimen of medications, patient counseled on importance of avoiding hypoglycemia and checking CBG when hypoglycemia is suspected. Will recheck A1c today. Further advice would results

## 2014-09-14 NOTE — Assessment & Plan Note (Signed)
GERD:  Patient has been taking his daughter's Prilosec for years with no complaints, and states that symptoms are well-controlled with this. We will prescribe Prilosec for the patient. Patient informed to contact us if reflux worsens or becomes refractory to treatment.

## 2014-09-14 NOTE — Assessment & Plan Note (Signed)
Symptoms of rhinorrhea while eating are suggestive of vasomotor rhinitis. Patient has not tried anything for this before, we will begin trial of Azelastine spray 2 sprays at bedtime prn. Patient instructed to contact us if new symptoms arise or side effects are noted.

## 2014-09-15 ENCOUNTER — Telehealth: Payer: Self-pay | Admitting: Internal Medicine

## 2014-09-15 NOTE — Telephone Encounter (Signed)
Spoke with the patient, average blood sugar before breakfast is 167. A1c elevated Plan: Morning insulin increased from 45 to 55 P.m. insulin increased from 35 to 42 Check CBGs twice a day before breakfast and dinner Follow-up in 2 weeks for Coumadin check and will bring CBG log

## 2014-09-15 NOTE — Telephone Encounter (Signed)
FYI

## 2014-09-15 NOTE — Telephone Encounter (Signed)
Relation to pt: self Call back number: [phone]   Reason for call:  Pt returning MD call regarding diabetes control

## 2014-10-02 ENCOUNTER — Telehealth: Payer: Self-pay | Admitting: Internal Medicine

## 2014-10-02 NOTE — Telephone Encounter (Signed)
Needs INR check and bring his CBG readings that day, please arrange

## 2014-10-03 ENCOUNTER — Other Ambulatory Visit: Payer: Self-pay

## 2014-10-03 DIAGNOSIS — E119 Type 2 diabetes mellitus without complications: Secondary | ICD-10-CM

## 2014-10-03 MED ORDER — GLUCOSE BLOOD VI STRP: ORAL_STRIP | Status: DC

## 2014-10-03 NOTE — Telephone Encounter (Signed)
thx

## 2014-10-03 NOTE — Telephone Encounter (Signed)
Pt has appt scheduled for his INR on 10/04/2014 at 1500.

## 2014-10-04 ENCOUNTER — Other Ambulatory Visit: Payer: Self-pay

## 2014-10-04 ENCOUNTER — Ambulatory Visit (INDEPENDENT_AMBULATORY_CARE_PROVIDER_SITE_OTHER): Payer: Medicare Other | Admitting: *Deleted

## 2014-10-04 ENCOUNTER — Telehealth: Payer: Self-pay | Admitting: Internal Medicine

## 2014-10-04 VITALS — BP 133/74 | HR 72 | Resp 16 | Wt 191.0 lb

## 2014-10-04 DIAGNOSIS — I482 Chronic atrial fibrillation, unspecified: Secondary | ICD-10-CM

## 2014-10-04 LAB — POCT INR: INR: 5

## 2014-10-04 MED ORDER — INSULIN NPH (HUMAN) (ISOPHANE) 100 UNIT/ML ~~LOC~~ SUSP: SUBCUTANEOUS | Status: DC

## 2014-10-04 NOTE — Telephone Encounter (Addendum)
Relation to pt: self  Call back number: [phone]   Reason for call:  Pt requesting RX for insulin change, CMA advised she will send RX.

## 2014-10-04 NOTE — Progress Notes (Signed)
Pre visit review using our clinic review tool, if applicable. No additional management support is needed unless otherwise documented below in the visit note. 

## 2014-10-04 NOTE — Patient Instructions (Signed)
Hold coumadin for 3 days.  On Monday, Wednesday, and Friday take 1 tablet (3mg ).  Other days take 1/2 tablet (1.5 mg). Recheck in 2 weeks.

## 2014-10-04 NOTE — Telephone Encounter (Signed)
Changes made to insulin dosage: Pt is using 55 units in the AM and 42 units in PM, which was changed on 09/15/2014. Rx sent to [facility] as requested.

## 2014-10-04 NOTE — Telephone Encounter (Signed)
Caller name: Derma  Call back number: [phone] Phone   Reason for call:  As per pharmacy RX needs to state 90 day instead of 30 day supply.

## 2014-10-05 ENCOUNTER — Other Ambulatory Visit: Payer: Self-pay

## 2014-10-05 MED ORDER — INSULIN NPH (HUMAN) (ISOPHANE) 100 UNIT/ML ~~LOC~~ SUSP: SUBCUTANEOUS | Status: DC

## 2014-10-05 NOTE — Telephone Encounter (Signed)
Rx sent yesterday on 10/04/2014 already states 90 day supply, instead of 30 day.

## 2014-10-05 NOTE — Addendum Note (Signed)
Addended by: Wilfrid Lund on: 10/05/2014 09:19 AM   Modules accepted: Orders

## 2014-10-05 NOTE — Telephone Encounter (Signed)
Rx resent to [facility] for 50 mL. Informed them to let me know if not adequate enough for 90 day supply.

## 2014-10-06 DIAGNOSIS — H43813 Vitreous degeneration, bilateral: Secondary | ICD-10-CM | POA: Diagnosis not present

## 2014-10-06 DIAGNOSIS — H4011X3 Primary open-angle glaucoma, severe stage: Secondary | ICD-10-CM | POA: Diagnosis not present

## 2014-10-06 DIAGNOSIS — H5211 Myopia, right eye: Secondary | ICD-10-CM | POA: Diagnosis not present

## 2014-10-06 DIAGNOSIS — Z961 Presence of intraocular lens: Secondary | ICD-10-CM | POA: Diagnosis not present

## 2014-10-18 ENCOUNTER — Ambulatory Visit (INDEPENDENT_AMBULATORY_CARE_PROVIDER_SITE_OTHER): Payer: Commercial Managed Care - HMO | Admitting: *Deleted

## 2014-10-18 VITALS — BP 146/50 | HR 78 | Temp 97.9°F | Resp 16 | Ht 67.0 in | Wt 188.2 lb

## 2014-10-18 DIAGNOSIS — I482 Chronic atrial fibrillation, unspecified: Secondary | ICD-10-CM

## 2014-10-18 LAB — POCT INR: INR: 3.3

## 2014-10-18 NOTE — Progress Notes (Signed)
Pre visit review using our clinic review tool, if applicable. No additional management support is needed unless otherwise documented below in the visit note. 

## 2014-10-18 NOTE — Patient Instructions (Signed)
Hold coumadin for 3 days.  On Monday, Wednesday, and Friday take 1 tablet (3mg ).  Other days take 1/2 tablet (1.5 mg). Recheck in 2 weeks.

## 2014-10-26 ENCOUNTER — Other Ambulatory Visit: Payer: Self-pay | Admitting: Internal Medicine

## 2014-11-02 ENCOUNTER — Ambulatory Visit (INDEPENDENT_AMBULATORY_CARE_PROVIDER_SITE_OTHER): Payer: Commercial Managed Care - HMO

## 2014-11-02 VITALS — BP 137/56 | HR 65 | Temp 98.2°F | Ht 67.0 in | Wt 189.4 lb

## 2014-11-02 DIAGNOSIS — I482 Chronic atrial fibrillation, unspecified: Secondary | ICD-10-CM

## 2014-11-02 DIAGNOSIS — Z7901 Long term (current) use of anticoagulants: Secondary | ICD-10-CM

## 2014-11-02 LAB — POCT INR: INR: 2.2

## 2014-11-02 NOTE — Patient Instructions (Signed)
Continue to take 1 tablet on Mon/Wed/Fri and 1/2 tablet all other days.  Recheck in 4 weeks (11/30/14).

## 2014-11-02 NOTE — Progress Notes (Signed)
Pre visit review using our clinic review tool, if applicable. No additional management support is needed unless otherwise documented below in the visit note. 

## 2014-11-30 ENCOUNTER — Ambulatory Visit (INDEPENDENT_AMBULATORY_CARE_PROVIDER_SITE_OTHER): Payer: Commercial Managed Care - HMO | Admitting: *Deleted

## 2014-11-30 DIAGNOSIS — I482 Chronic atrial fibrillation, unspecified: Secondary | ICD-10-CM

## 2014-11-30 DIAGNOSIS — Z7901 Long term (current) use of anticoagulants: Secondary | ICD-10-CM

## 2014-11-30 LAB — POCT INR: INR: 1.5

## 2014-11-30 NOTE — Patient Instructions (Signed)
Take 2 tabs (6mg ) tonight.  Then, continue to take 1 tablet on Mon/Wed/Fri and 1/2 tablet all other days.  Recheck in 3 weeks (11/30/14).

## 2014-11-30 NOTE — Progress Notes (Signed)
Pre visit review using our clinic review tool, if applicable. No additional management support is needed unless otherwise documented below in the visit note. 

## 2014-12-21 ENCOUNTER — Ambulatory Visit (INDEPENDENT_AMBULATORY_CARE_PROVIDER_SITE_OTHER): Payer: Commercial Managed Care - HMO

## 2014-12-21 DIAGNOSIS — I4891 Unspecified atrial fibrillation: Secondary | ICD-10-CM | POA: Diagnosis not present

## 2014-12-21 LAB — POCT INR: INR: 1.7

## 2014-12-31 ENCOUNTER — Other Ambulatory Visit: Payer: Self-pay | Admitting: Internal Medicine

## 2015-01-03 ENCOUNTER — Other Ambulatory Visit: Payer: Self-pay | Admitting: Internal Medicine

## 2015-01-04 ENCOUNTER — Ambulatory Visit (INDEPENDENT_AMBULATORY_CARE_PROVIDER_SITE_OTHER): Payer: Commercial Managed Care - HMO

## 2015-01-04 DIAGNOSIS — I482 Chronic atrial fibrillation, unspecified: Secondary | ICD-10-CM

## 2015-01-04 DIAGNOSIS — Z7901 Long term (current) use of anticoagulants: Secondary | ICD-10-CM

## 2015-01-04 LAB — POCT INR: INR: 1.7

## 2015-01-04 NOTE — Progress Notes (Signed)
Pre visit review using our clinic review tool, if applicable. No additional management support is needed unless otherwise documented below in the visit note. 

## 2015-01-04 NOTE — Patient Instructions (Signed)
Take 1 (3mg ) tablet on Sunday, Monday,Wednesday, Thursday and Saturday. Take 1/2 tablet (1.5 mg) on Tuesday and Friday. Return to office for INR re-check in 2 weeks.

## 2015-01-17 ENCOUNTER — Encounter: Payer: Self-pay | Admitting: *Deleted

## 2015-01-18 ENCOUNTER — Ambulatory Visit (INDEPENDENT_AMBULATORY_CARE_PROVIDER_SITE_OTHER): Payer: Commercial Managed Care - HMO

## 2015-01-18 DIAGNOSIS — I482 Chronic atrial fibrillation, unspecified: Secondary | ICD-10-CM

## 2015-01-18 DIAGNOSIS — Z7901 Long term (current) use of anticoagulants: Secondary | ICD-10-CM

## 2015-01-18 LAB — POCT INR: INR: 2.3

## 2015-01-18 NOTE — Patient Instructions (Signed)
Take 1 (3mg ) tablet on Sunday, Monday,Wednesday, Thursday and Saturday. Take 1/2 tablet (1.5 mg) on Tuesday and Friday. Return to office for INR re-check in 3 weeks.

## 2015-01-18 NOTE — Progress Notes (Signed)
Pre visit review using our clinic review tool, if applicable. No additional management support is needed unless otherwise documented below in the visit note. 

## 2015-01-19 ENCOUNTER — Telehealth: Payer: Self-pay | Admitting: Internal Medicine

## 2015-01-19 ENCOUNTER — Ambulatory Visit (INDEPENDENT_AMBULATORY_CARE_PROVIDER_SITE_OTHER): Payer: Commercial Managed Care - HMO | Admitting: *Deleted

## 2015-01-19 ENCOUNTER — Ambulatory Visit (INDEPENDENT_AMBULATORY_CARE_PROVIDER_SITE_OTHER): Payer: Commercial Managed Care - HMO | Admitting: Internal Medicine

## 2015-01-19 ENCOUNTER — Other Ambulatory Visit: Payer: Commercial Managed Care - HMO

## 2015-01-19 ENCOUNTER — Encounter: Payer: Self-pay | Admitting: Internal Medicine

## 2015-01-19 VITALS — BP 134/76 | HR 62 | Ht 67.0 in | Wt 189.6 lb

## 2015-01-19 DIAGNOSIS — Z950 Presence of cardiac pacemaker: Secondary | ICD-10-CM | POA: Diagnosis not present

## 2015-01-19 DIAGNOSIS — R110 Nausea: Secondary | ICD-10-CM

## 2015-01-19 DIAGNOSIS — R42 Dizziness and giddiness: Secondary | ICD-10-CM

## 2015-01-19 DIAGNOSIS — H538 Other visual disturbances: Secondary | ICD-10-CM

## 2015-01-19 DIAGNOSIS — R61 Generalized hyperhidrosis: Secondary | ICD-10-CM

## 2015-01-19 DIAGNOSIS — IMO0001 Reserved for inherently not codable concepts without codable children: Secondary | ICD-10-CM

## 2015-01-19 LAB — CUP PACEART INCLINIC DEVICE CHECK
Date Time Interrogation Session: 20160908171249
Lead Channel Impedance Value: 0 Ohm
Lead Channel Impedance Value: 543 Ohm
Lead Channel Pacing Threshold Amplitude: 0.75 V
Lead Channel Pacing Threshold Pulse Width: 0.4 ms
Lead Channel Setting Pacing Amplitude: 2.5 V
MDC IDC MSMT BATTERY IMPEDANCE: 1296 Ohm
MDC IDC MSMT BATTERY REMAINING LONGEVITY: 49 mo
MDC IDC MSMT BATTERY VOLTAGE: 2.75 V
MDC IDC SET LEADCHNL RV PACING PULSEWIDTH: 0.4 ms
MDC IDC SET LEADCHNL RV SENSING SENSITIVITY: 4 mV
MDC IDC STAT BRADY RV PERCENT PACED: 97 %

## 2015-01-19 NOTE — Telephone Encounter (Signed)
New problem   Pt stated his vision is blurred, nausea and sweating with NO chest pains. Pt want a call back from nurse to possibly come in today.

## 2015-01-19 NOTE — Progress Notes (Signed)
Pacemaker check in clinic, add on for Dr. Harrington Challenger due to pt having episode of dizziness and diaphoresis 01/18/15 PM. Normal device function. Threshold, impedance consistent with previous measurements. Device programmed to maximize longevity. No high ventricular rates noted. Device programmed at appropriate safety margins. Histogram distribution appropriate for patient activity level. Device programmed to optimize intrinsic conduction. Estimated longevity 2.5-5 years. Overdue to see Dr. Lovena Le-- will schedule.

## 2015-01-19 NOTE — Telephone Encounter (Signed)
Spoke with pt and he states at about 7:30P last night he had a sudden episode of blurred vision, nausea, sweating and felt like he was going to pass out. Pt states this happened just the one time and has never happened previously. Pt denies any CP or SOB. No vitals were available. Pt states that started feeling better after about 10 minutes and was able to get back up and move around after 25 minutes. Pt denies any changes to diet or medication. Spoke with Dr. Harrington Challenger and she said to have pt come in for CBC, BMET and dig level today and check his BP when he comes in. Spoke with pt and he said he would come over now and was appreciative for call.

## 2015-01-19 NOTE — Telephone Encounter (Signed)
Follow up   Pt stated his vision is blurred, nausea and sweating with NO chest pains.   Pt want a call back from nurse to possibly come in today.

## 2015-01-19 NOTE — Progress Notes (Signed)
Cardiology Office Note   Date:  01/19/2015   ID:  [name], DOB [birth year], MRN [id number]  PCP:  Nicholas November, MD  Cardiologist:   Nicholas Carnes, MD   Pt presents for eval of dizzy spell    History of Present Illness: [name] is a 81 y.o. male with a history ofCAD, AAA, atrial fib He is also followed byG Lovena Le for PPM He called in to clinic today because of spell he had yesterday  He was Getting  Ready to eat dinner last night  Sitting  Then vision got out of focus  Sweating  Felt like might pass out Went to adjoining room   Daughter helped him  He   Felt Hot  Fan blowing  Cooled off  Lasted about 2 min.   No heart racing  No CP   Coumadin was checked yesterday pm  INR 2.3 No N/V   Ate a little after    Went home and went to bed Slept fairly good This AM felt normal.       Current Outpatient Prescriptions  Medication Sig Dispense Refill  . aspirin 81 MG tablet Take 81 mg by mouth daily.    [name] atenolol (TENORMIN) 50 MG tablet Take 1 tablet (50 mg total) by mouth 2 (two) times daily. 180 tablet 1  . azelastine (ASTELIN) 0.1 % nasal spray Place 2 sprays into both nostrils at bedtime as needed for rhinitis. Use in each nostril as directed 30 mL 3  . digoxin (LANOXIN) 0.125 MG tablet TAKE ONE TABLET BY MOUTH ONCE DAILY 90 tablet 0  . EASY TOUCH INSULIN SYRINGE 31G X 5/16" 1 ML MISC USE AS DIRECTED WITH NPH INSULIN 100 each 5  . fenofibrate micronized (LOFIBRA) 134 MG capsule Take 1 capsule (134 mg total) by mouth daily. 90 capsule 1  . ferrous sulfate 325 (65 FE) MG EC tablet Take 1 tablet (325 mg total) by mouth daily. 30 tablet 11  . Flexpen Starter Kit MISC 1 kit by Other route once. 1 kit 0  . glucose blood test strip Check blood sugar three times daily. 100 each 12  . glucose monitoring kit (FREESTYLE) monitoring kit 1 each by Does not apply route as needed for other. For TID glucose testing 1 each 0  . Lancets (ONETOUCH ULTRASOFT) lancets Use as instructed 100 each 12  .  levothyroxine (SYNTHROID, LEVOTHROID) 88 MCG tablet Take 1 tablet (88 mcg total) by mouth daily. 90 tablet 1  . nitroGLYCERIN (NITROSTAT) 0.4 MG SL tablet Place 0.4 mg under the tongue every 5 (five) minutes as needed. For chest pain    . NOVOLIN N RELION 100 UNIT/ML injection INJECT 55 UNITS UNDER THE SKIN ONE HOUR BEFORE BREAKFAST AND INJECT 42 UNITS ONE HOUR BEFORE SUPPER 50 mL 0  . omeprazole (PRILOSEC) 40 MG capsule Take 1 capsule (40 mg total) by mouth daily before breakfast. For acid reflux 90 capsule 3  . pravastatin (PRAVACHOL) 40 MG tablet Take 2 tablets (80 mg total) by mouth daily. 180 tablet 1  . warfarin (COUMADIN) 3 MG tablet Take as directed on Coumadin Clinic. 90 tablet 1   No current facility-administered medications for this visit.    Allergies:   Ace inhibitors; Codeine phosphate; and Hydrochlorothiazide w-triamterene   Past Medical History  Diagnosis Date  . Diabetes mellitus, type 2   . CAD (coronary artery disease)   . Hypertension   . Hyperlipidemia   . Atrial fibrillation   .  AAA (abdominal aortic aneurysm)   . Hypothyroidism   . BPH (benign prostatic hyperplasia)     reports aprocedure (TURP) remotely in [place].Marland KitchenNo futher f/u w/ urology  . GERD (gastroesophageal reflux disease)   . Insomnia     transient  . Vitamin B12 deficiency   . Osteopenia     dexa 2-11  . Diverticulosis     left colon  . Erosive gastritis   . Anemia due to chronic blood loss 11/25/2008    Recurrent over the years EGD and colonoscopy x 2 each 2004 and 2010 without cause      Past Surgical History  Procedure Laterality Date  . Pacemaker  1980, 1995, 2013    medtronic minix 8341  . Cholecystectomy, laparoscopic    . Prostatectomy      transurethral  . Inguinal herniorrhaphies      bilateral  . Right hip replacement    . Coronary artery bypass graft      1999 stents in 2000  . Penile prosthesis implant  1992  . Transthoracic echocardiogram  12/2006  . Esophagogastroduodenoscopy       multiple  . Colonoscopy      multiple  . Cataract extraction      right  . Cardiac catheterization  01/31/06, 09/25/10, 09-2011  . Givens capsule study N/A 11/04/2012    Procedure: GIVENS CAPSULE STUDY;  Surgeon: Gatha Mayer, MD;  Location: [facility];  Service: Endoscopy;  Laterality: N/A;  . Permanent pacemaker generator change N/A 06/05/2011    Procedure: PERMANENT PACEMAKER GENERATOR CHANGE;  Surgeon: Evans Lance, MD;  Location: [facility] CATH LAB;  Service: Cardiovascular;  Laterality: N/A;  . Left heart catheterization with coronary angiogram N/A 10/04/2011    Procedure: LEFT HEART CATHETERIZATION WITH CORONARY ANGIOGRAM;  Surgeon: Burnell Blanks, MD;  Location: [facility] CATH LAB;  Service: Cardiovascular;  Laterality: N/A;     Social History:  The patient  reports that he has never smoked. He has never used smokeless tobacco. He reports that he drinks about 0.6 - 1.2 oz of alcohol per week. He reports that he does not use illicit drugs.   Family History:  The patient's family history includes Alzheimer's disease in his mother and sister; Coronary artery disease in his brother; Diabetes in his other; Heart failure in his father; Mental illness in his sister; Stroke in his father. There is no history of Prostate cancer, Colon cancer, Esophageal cancer, Stomach cancer, or Rectal cancer.    ROS:  Please see the history of present illness. All other systems are reviewed and  Negative to the above problem except as noted.    PHYSICAL EXAM: VS:  BP 134/76 mmHg  Pulse 62  Ht _0  (1.702 m)  Wt 189 lb 9.6 oz (86.002 kg)  BMI 29.69 kg/m2  GEN: Well nourished, well developed, in no acute distress HEENT: normal Neck: no JVD, carotid bruits, or masses Cardiac: RRR; no murmurs, rubs, or gallops,no edema  Respiratory:  clear to auscultation bilaterally, normal work of breathing GI: soft, nontender, nondistended, + BS  No hepatomegaly  MS: no deformity Moving all extremities   Skin:  warm and dry, no rash Neuro:  Strength and sensation are intact Psych: euthymic mood, full affect   EKG:  EKG is ordered today.  Atrial fib  62 bpm  Ventricluar paced   Lipid Panel    Component Value Date/Time   CHOL 143 10/28/2013 1431   TRIG 256.0* 10/28/2013 1431   TRIG 113  03/26/2006 1118   HDL 28.70* 10/28/2013 1431   CHOLHDL 5 10/28/2013 1431   CHOLHDL 3.5 CALC 03/26/2006 1118   VLDL 51.2* 10/28/2013 1431   LDLCALC 63 10/28/2013 1431   LDLDIRECT 84.7 06/03/2012 1705      Wt Readings from Last 3 Encounters:  01/19/15 189 lb 9.6 oz (86.002 kg)  11/02/14 189 lb 6.4 oz (85.911 kg)  10/18/14 188 lb 3.2 oz (85.367 kg)      ASSESSMENT AND PLAN:  1.  Spell  I am not sure what this represents   Pacer was interrogated  There was no tachycardia     Will get CBC and dig level  Observe for now  2.  CAD  No symtoms to suggest ischemia  Follow  Continue meds  33.  Atrial fib   Continue anticoagulation  4.  AAA  WIll need to be followed   F/U in spring, sooner if symtpoms recur   Current medicines are reviewed at length with the patient today.  The patient does not have concerns regarding medicines.  The following changes have been made:   Labs/ tests ordered today include: No orders of the defined types were placed in this encounter.     Disposition:   FU with  in   Signed, Nicholas Carnes, MD  01/19/2015 3:53 PM    [address], [place], [place]  [address] Phone: [phone]; Fax: [phone]

## 2015-01-19 NOTE — Patient Instructions (Signed)
Your physician recommends that you continue on your current medications as directed. Please refer to the Current Medication list given to you today. Your physician recommends that you return for lab work today (CBC, BMET, DIGOXIN LEVEL)

## 2015-01-20 ENCOUNTER — Telehealth: Payer: Self-pay

## 2015-01-20 LAB — CBC
HEMATOCRIT: 45.1 % (ref 39.0–52.0)
HEMOGLOBIN: 15.2 g/dL (ref 13.0–17.0)
MCHC: 33.7 g/dL (ref 30.0–36.0)
MCV: 96.9 fl (ref 78.0–100.0)
Platelets: 204 10*3/uL (ref 150.0–400.0)
RBC: 4.66 Mil/uL (ref 4.22–5.81)
RDW: 13.8 % (ref 11.5–15.5)
WBC: 6.2 10*3/uL (ref 4.0–10.5)

## 2015-01-20 LAB — BASIC METABOLIC PANEL
BUN: 24 mg/dL — ABNORMAL HIGH (ref 6–23)
CALCIUM: 8.8 mg/dL (ref 8.4–10.5)
CO2: 25 meq/L (ref 19–32)
CREATININE: 1.48 mg/dL (ref 0.40–1.50)
Chloride: 103 mEq/L (ref 96–112)
GFR: 47.8 mL/min — ABNORMAL LOW (ref >60.00)
Glucose, Bld: 249 mg/dL — ABNORMAL HIGH (ref 70–99)
Potassium: 4.7 mEq/L (ref 3.5–5.1)
SODIUM: 135 meq/L (ref 135–145)

## 2015-01-20 LAB — DIGOXIN LEVEL: DIGOXIN LVL: 0.7 ug/L — AB (ref 0.8–2.0)

## 2015-01-20 NOTE — Telephone Encounter (Signed)
-----   Message from Frederick, MD sent at 01/20/2015 10:42 AM EDT ----- CBC is normal Electrolyts show high glucose,  Pt may be a little dry  I would make shure he stays hydrated.

## 2015-01-20 NOTE — Telephone Encounter (Signed)
spoke with pt about his recent lab results. pt verbalized understanding and states that he will try to stay more hydrated.

## 2015-02-08 ENCOUNTER — Ambulatory Visit (INDEPENDENT_AMBULATORY_CARE_PROVIDER_SITE_OTHER): Payer: Commercial Managed Care - HMO | Admitting: Internal Medicine

## 2015-02-08 ENCOUNTER — Telehealth: Payer: Self-pay | Admitting: Internal Medicine

## 2015-02-08 DIAGNOSIS — I4891 Unspecified atrial fibrillation: Secondary | ICD-10-CM

## 2015-02-08 DIAGNOSIS — I4892 Unspecified atrial flutter: Secondary | ICD-10-CM | POA: Diagnosis not present

## 2015-02-08 LAB — POCT INR: INR: 2

## 2015-02-08 NOTE — Patient Instructions (Addendum)
Patient in for Coumadin check today. Continue same dosage as before and return in 3 weeks . Patient given instruction sheet and return appointment date.

## 2015-02-08 NOTE — Telephone Encounter (Signed)
Caller name: Eliott Relation to pt: self  Call back [phone] Pharmacy:  Reason for call: Pt came in office stating was concern on a bill that he received by mail and is wanting to know how he can handle situation on some payment being requested from him, pt states he would like to speak with someone personally from our office, and if not he is not willing to come back to our office. Please advise ASAP.

## 2015-02-13 ENCOUNTER — Other Ambulatory Visit: Payer: Self-pay | Admitting: Internal Medicine

## 2015-02-13 ENCOUNTER — Telehealth: Payer: Self-pay | Admitting: Internal Medicine

## 2015-02-13 NOTE — Telephone Encounter (Signed)
Error/gd °

## 2015-02-22 DIAGNOSIS — H401133 Primary open-angle glaucoma, bilateral, severe stage: Secondary | ICD-10-CM | POA: Diagnosis not present

## 2015-02-28 ENCOUNTER — Encounter: Payer: Self-pay | Admitting: Internal Medicine

## 2015-03-01 ENCOUNTER — Ambulatory Visit: Payer: Commercial Managed Care - HMO

## 2015-03-30 ENCOUNTER — Other Ambulatory Visit: Payer: Self-pay | Admitting: Internal Medicine

## 2015-04-21 NOTE — Telephone Encounter (Signed)
Told pt I could get to Team Leader and he said no..he needs to talk to the high level mgr and wants a return call next week. He's been waiting months.

## 2015-04-21 NOTE — Telephone Encounter (Signed)
Caller name: [name]  Relation to pt: self  Call back number:[phone]  Pt is wanting a call about billing.

## 2015-04-23 ENCOUNTER — Telehealth: Payer: Self-pay | Admitting: Internal Medicine

## 2015-04-23 DIAGNOSIS — Z7901 Long term (current) use of anticoagulants: Secondary | ICD-10-CM

## 2015-04-23 DIAGNOSIS — Z5181 Encounter for therapeutic drug level monitoring: Secondary | ICD-10-CM

## 2015-04-23 NOTE — Telephone Encounter (Signed)
Patient is overdue for an INR, please call and arrange a visit for this week

## 2015-04-27 ENCOUNTER — Telehealth: Payer: Self-pay

## 2015-04-27 NOTE — Telephone Encounter (Signed)
Left message to scheduled AWV 

## 2015-04-28 MED ORDER — INSULIN NPH (HUMAN) (ISOPHANE) 100 UNIT/ML ~~LOC~~ SUSP: SUBCUTANEOUS | Status: DC

## 2015-04-28 NOTE — Addendum Note (Signed)
Addended by: Rudene Anda on: 04/28/2015 05:18 PM   Modules accepted: Orders

## 2015-04-28 NOTE — Telephone Encounter (Signed)
Called patient back regarding INR check. States he has been waiting for a while to speak with [name] regarding his money issue. States he was called by someone but he only wants to speak with [name]. On his last visit he discussed his problem with front office and was told he needed to speak with her.

## 2015-04-28 NOTE — Telephone Encounter (Signed)
I talked to the patient and patient informed me that his medicaid needed to be be filed. I added the insurance to file and reached out to billing to refill the claims that [name] has outstanding balances on. Waiting to hear back from [facility] in billing and I will reach back out to patient with more information

## 2015-04-28 NOTE — Telephone Encounter (Addendum)
Please ask. [name] to discuss w/ pt  I think will be less expensive to just come  to the lab and get an INR check (venous stick) , please arrange that and we will call [name] with the results.  Addendum: We can also transfer [name] to Xarelto if it is covered by his insurance

## 2015-04-28 NOTE — Telephone Encounter (Addendum)
Called and spoke to patient who states he was very upset that he has not heard back from [name] yet.  He feels that [name] is avoiding him and he's on the verge of finding another doctor.  He says he just does not understand why he can talk to everybody else in the office, but cannot speak to the only person that can do anything about his issue.  When asked what his concerns were he said that he is being charged every time he comes in and he cannot afford to pay.  He says he only has $999 coming in every month and he simply cannot afford to come in.  I expressed to the patient my understanding.  I also expressed my concern about the potential danger he is in from not coming in to have his INR checked. I explained to him the risks involved in not having his INR checked and strongly advised patient to come in for at least a lab draw, as his INR has not been checked since 02/08/15.   He stated understanding, but says if he's charged he is not going to pay, because he cannot afford it.  Lab appointment scheduled.  He also said that he would be interested in home monitoring as an option. Will check with provider.  Provider states home monitoring is not an option.   Pt also expressed that per his vials of insulin, someone changed the dose of his insulin (Novolin N) to 45 units in the morning and 35 units in the afternoon.  He says that this is not what [name] advised him to be on and he wants to know why this was changed.  Per his chart, the last refill sent in was on 02/13/15 and the instructions listed were INJECT 55 UNITS UNDER THE SKIN ONE HOUR BEFORE BREAKFAST AND INJECT 42 UNITS UNDER THE SKIN ONE HOUR BEFORE SUPPER.  [name] and [name], [employer] verified that the instructions in the system says 45 units in the morning and 35 units in the evening.  Last pick up was 04/03/15.  Pharm tech stated that she also saw script for 55 units in the am and 42 units in the evening as of 02/13/15.  She says  that when patient picked up the last refill, the computer must have switched dosage back to an old script (45 units in the am and 35 units in the pm).  She says that patient should be about out of his current refill and will need a new Rx.  New Rx sent with most recent instructions.  Pt made aware.  He says that he never stopped taking the 55 units in the morning and 42 units in the evening.    Message routed to [name], [name], and [name] for [name].

## 2015-04-28 NOTE — Telephone Encounter (Signed)
Called patient left message regarding appointment for today or Monday to come in to have INR checked. Asked patient to return call.

## 2015-05-01 ENCOUNTER — Other Ambulatory Visit (INDEPENDENT_AMBULATORY_CARE_PROVIDER_SITE_OTHER): Payer: Commercial Managed Care - HMO

## 2015-05-01 DIAGNOSIS — Z5181 Encounter for therapeutic drug level monitoring: Secondary | ICD-10-CM | POA: Diagnosis not present

## 2015-05-01 DIAGNOSIS — Z7901 Long term (current) use of anticoagulants: Secondary | ICD-10-CM | POA: Diagnosis not present

## 2015-05-01 LAB — PROTIME-INR
INR: 2.3 ratio — AB (ref 0.8–1.0)
Prothrombin Time: 24.5 s — ABNORMAL HIGH (ref 9.6–13.1)

## 2015-05-01 NOTE — Telephone Encounter (Signed)
Pt states he takes Coumadin 3 mg tablets---1 tablet every day except 1/2 tablet on Wednesday and Saturday.

## 2015-05-01 NOTE — Telephone Encounter (Addendum)
Pt came in today for lab appointment.   His INR: 2.3.     Goal:  2.0 - 3.0.   Called patient to see how he's currently taking coumadin.  Left a message for call back.

## 2015-05-03 NOTE — Telephone Encounter (Signed)
Spoke with [name], explained that Charlena Cross was already handling his billing issue and apologized that we did not get it correct the first time. He is requesting to have myself or Ebony follow up with him in a few weeks to verify that insurance is going to cover his visit before he schedules another appointment. I will have Ebony give him a call as I will be out of the office.

## 2015-05-03 NOTE — Telephone Encounter (Signed)
Notes Recorded by Rudene Anda, RN on 05/03/2015 at 9:20 AM Pt notified and provider's recommendations discussed. Pt does not want to schedule recheck at this time. States he would still like to talk to [name] regarding his billing issue, otherwise he does not plan to come back. Will make [name] aware. Notes Recorded by Colon Branch, MD on 05/01/2015 at 4:54 PM Please call the patient: INR normal, continue with his same Coumadin dose, recheck in one month. Then release to my chart.

## 2015-05-17 DIAGNOSIS — H401133 Primary open-angle glaucoma, bilateral, severe stage: Secondary | ICD-10-CM | POA: Diagnosis not present

## 2015-05-27 ENCOUNTER — Other Ambulatory Visit: Payer: Self-pay | Admitting: Internal Medicine

## 2015-05-31 ENCOUNTER — Other Ambulatory Visit: Payer: Self-pay | Admitting: Internal Medicine

## 2015-06-07 NOTE — Telephone Encounter (Signed)
Received notification from Billing that the claim was resubmitted and the balance has been taken care of. I called and informed the patient

## 2015-06-13 ENCOUNTER — Other Ambulatory Visit: Payer: Self-pay | Admitting: Internal Medicine

## 2015-06-22 ENCOUNTER — Telehealth: Payer: Self-pay | Admitting: Internal Medicine

## 2015-06-22 NOTE — Telephone Encounter (Signed)
Left message for patient to return a call to schedule inr appt

## 2015-06-22 NOTE — Telephone Encounter (Signed)
Needs a lab appointment for an INR. Please arrange

## 2015-07-17 ENCOUNTER — Other Ambulatory Visit: Payer: Self-pay | Admitting: Internal Medicine

## 2015-07-17 NOTE — Telephone Encounter (Signed)
Medication filled to pharmacy as requested.   

## 2015-08-04 ENCOUNTER — Other Ambulatory Visit: Payer: Self-pay | Admitting: Internal Medicine

## 2015-08-04 ENCOUNTER — Encounter: Payer: Self-pay | Admitting: Internal Medicine

## 2015-09-13 ENCOUNTER — Other Ambulatory Visit: Payer: Self-pay | Admitting: Internal Medicine

## 2015-09-13 NOTE — Telephone Encounter (Signed)
Rx's sent to the pharmacy by e-script.  Pt need to schedule a appt for follow-up.//AB/CMA

## 2015-09-27 ENCOUNTER — Ambulatory Visit (HOSPITAL_BASED_OUTPATIENT_CLINIC_OR_DEPARTMENT_OTHER)
Admission: RE | Admit: 2015-09-27 | Discharge: 2015-09-27 | Disposition: A | Discharge status: Home / Self Care | Payer: Commercial Managed Care - HMO | Source: Ambulatory Visit | Attending: Internal Medicine | Admitting: Internal Medicine

## 2015-09-27 ENCOUNTER — Ambulatory Visit (INDEPENDENT_AMBULATORY_CARE_PROVIDER_SITE_OTHER): Payer: Commercial Managed Care - HMO | Admitting: Internal Medicine

## 2015-09-27 ENCOUNTER — Encounter: Payer: Self-pay | Admitting: Internal Medicine

## 2015-09-27 VITALS — BP 132/76 | HR 62 | Temp 98.0°F | Ht 67.0 in | Wt 178.5 lb

## 2015-09-27 DIAGNOSIS — E538 Deficiency of other specified B group vitamins: Secondary | ICD-10-CM

## 2015-09-27 DIAGNOSIS — I10 Essential (primary) hypertension: Secondary | ICD-10-CM | POA: Diagnosis not present

## 2015-09-27 DIAGNOSIS — E785 Hyperlipidemia, unspecified: Secondary | ICD-10-CM | POA: Diagnosis not present

## 2015-09-27 DIAGNOSIS — M25512 Pain in left shoulder: Secondary | ICD-10-CM

## 2015-09-27 DIAGNOSIS — E039 Hypothyroidism, unspecified: Secondary | ICD-10-CM

## 2015-09-27 DIAGNOSIS — E119 Type 2 diabetes mellitus without complications: Secondary | ICD-10-CM

## 2015-09-27 DIAGNOSIS — S4992XA Unspecified injury of left shoulder and upper arm, initial encounter: Secondary | ICD-10-CM | POA: Diagnosis not present

## 2015-09-27 DIAGNOSIS — M19012 Primary osteoarthritis, left shoulder: Secondary | ICD-10-CM | POA: Insufficient documentation

## 2015-09-27 DIAGNOSIS — E118 Type 2 diabetes mellitus with unspecified complications: Secondary | ICD-10-CM

## 2015-09-27 DIAGNOSIS — I4891 Unspecified atrial fibrillation: Secondary | ICD-10-CM

## 2015-09-27 DIAGNOSIS — Z09 Encounter for follow-up examination after completed treatment for conditions other than malignant neoplasm: Secondary | ICD-10-CM

## 2015-09-27 LAB — COMPREHENSIVE METABOLIC PANEL
ALBUMIN: 3.8 g/dL (ref 3.5–5.2)
ALT: 13 U/L (ref 0–53)
AST: 12 U/L (ref 0–37)
Alkaline Phosphatase: 37 U/L — ABNORMAL LOW (ref 39–117)
BUN: 19 mg/dL (ref 6–23)
CALCIUM: 8.5 mg/dL (ref 8.4–10.5)
CHLORIDE: 107 meq/L (ref 96–112)
CO2: 22 mEq/L (ref 19–32)
CREATININE: 1.15 mg/dL (ref 0.40–1.50)
GFR: 63.85 mL/min (ref >60.00)
Glucose, Bld: 163 mg/dL — ABNORMAL HIGH (ref 70–99)
Potassium: 4.2 mEq/L (ref 3.5–5.1)
SODIUM: 135 meq/L (ref 135–145)
TOTAL PROTEIN: 6.1 g/dL (ref 6.0–8.3)
Total Bilirubin: 0.6 mg/dL (ref 0.2–1.2)

## 2015-09-27 LAB — CBC WITH DIFFERENTIAL/PLATELET
BASOS PCT: 0.3 % (ref 0.0–3.0)
Basophils Absolute: 0 10*3/uL (ref 0.0–0.1)
EOS PCT: 3 % (ref 0.0–5.0)
Eosinophils Absolute: 0.2 10*3/uL (ref 0.0–0.7)
HCT: 39.8 % (ref 39.0–52.0)
Hemoglobin: 13.8 g/dL (ref 13.0–17.0)
Lymphocytes Relative: 16.3 % (ref 12.0–46.0)
Lymphs Abs: 1.1 10*3/uL (ref 0.7–4.0)
MCHC: 34.6 g/dL (ref 30.0–36.0)
MCV: 95.9 fl (ref 78.0–100.0)
MONO ABS: 0.6 10*3/uL (ref 0.1–1.0)
Monocytes Relative: 8.2 % (ref 3.0–12.0)
NEUTROS ABS: 5.1 10*3/uL (ref 1.4–7.7)
NEUTROS PCT: 72.2 % (ref 43.0–77.0)
PLATELETS: 178 10*3/uL (ref 150.0–400.0)
RBC: 4.16 Mil/uL — AB (ref 4.22–5.81)
RDW: 13.9 % (ref 11.5–15.5)
WBC: 7 10*3/uL (ref 4.0–10.5)

## 2015-09-27 LAB — HEMOGLOBIN A1C: HEMOGLOBIN A1C: 10.4 % — AB (ref 4.6–6.5)

## 2015-09-27 LAB — LIPID PANEL
CHOLESTEROL: 144 mg/dL (ref 0–200)
HDL: 20.5 mg/dL — ABNORMAL LOW (ref >39.00)
Total CHOL/HDL Ratio: 7

## 2015-09-27 LAB — LDL CHOLESTEROL, DIRECT: Direct LDL: 74 mg/dL

## 2015-09-27 LAB — POCT INR: INR: 2.1

## 2015-09-27 LAB — VITAMIN B12: Vitamin B-12: 203 pg/mL — ABNORMAL LOW (ref 211–911)

## 2015-09-27 LAB — TSH: TSH: 2.05 u[IU]/mL (ref 0.35–4.50)

## 2015-09-27 MED ORDER — GLUCOSE BLOOD VI STRP: ORAL_STRIP | Status: DC

## 2015-09-27 MED ORDER — TRAMADOL HCL 50 MG PO TABS: 25.0000 mg | ORAL_TABLET | Freq: Four times a day (QID) | ORAL | Status: DC | PRN

## 2015-09-27 MED ORDER — ONETOUCH ULTRASOFT LANCETS MISC: Status: DC

## 2015-09-27 NOTE — Patient Instructions (Addendum)
GO TO THE LAB : Get the blood work     GO TO THE FRONT DESK Schedule your next appointment for a  checkup in 3 months. Schedule an appointment to check your Coumadin in 4 weeks  Go to the first floor and get a x-ray of the shoulder    Stop ibuprofen Tylenol 500 mg one or 2 tablets 3 times a day as needed for pain ULTAM-- Take HALF OR 1 TABLET 3-4 TIMES A DAY AS NEEDED. WILL CAUSE DROWSINESS. BE CAREFUL      Continue taking your Coumadin the same way   Check your blood sugar twice a day  at different times of the day GOALS: Fasting before a meal 70- 130 2 hours after a meal less than 180 At bedtime 90-150 Call if consistently not at goal

## 2015-09-27 NOTE — Progress Notes (Signed)
Subjective:    Patient ID: [name], male    DOB: 05-02-1926, 82 y.o.   MRN: [id number]  DOS:  09/27/2015 Type of visit - description : Routine checkup. Last office visit 09/2014 Interval history: 2 weeks ago he tripped and almost fall, catch himself with the left arm, since then is having pain at the left deltoid area. His pharmacists recommended ibuprofen, his taking 2 tablets 4 times a day. DM: Good compliance with insulin, no ambulatory blood sugars Atrial fibrillation, on Coumadin, taking Coumadin daily    Review of Systems Denies chest pain or difficulty breathing Had some nausea. No vomiting, blood in the stools or change in the color of the stools.   Past Medical History  Diagnosis Date  . Diabetes mellitus, type 2 ([place])   . CAD (coronary artery disease)   . Hypertension   . Hyperlipidemia   . Atrial fibrillation ([place])   . AAA (abdominal aortic aneurysm) ([place])   . Hypothyroidism   . BPH (benign prostatic hyperplasia)     reports aprocedure (TURP) remotely in [place] KitchenNo futher f/u w/ urology  . GERD (gastroesophageal reflux disease)   . Insomnia     transient  . Vitamin B12 deficiency   . Osteopenia     dexa 2-11  . Diverticulosis     left colon  . Erosive gastritis   . Anemia due to chronic blood loss 11/25/2008    Recurrent over the years EGD and colonoscopy x 2 each 2004 and 2010 without cause      Past Surgical History  Procedure Laterality Date  . Pacemaker  1980, 1995, 2013    medtronic minix 8341  . Cholecystectomy, laparoscopic    . Prostatectomy      transurethral  . Inguinal herniorrhaphies      bilateral  . Right hip replacement    . Coronary artery bypass graft      1999 stents in 2000  . Penile prosthesis implant  1992  . Transthoracic echocardiogram  12/2006  . Esophagogastroduodenoscopy      multiple  . Colonoscopy      multiple  . Cataract extraction      right  . Cardiac catheterization  01/31/06, 09/25/10, 09-2011  . Givens capsule  study N/A 11/04/2012    Procedure: GIVENS CAPSULE STUDY;  Surgeon: Gatha Mayer, MD;  Location: [facility];  Service: Endoscopy;  Laterality: N/A;  . Permanent pacemaker generator change N/A 06/05/2011    Procedure: PERMANENT PACEMAKER GENERATOR CHANGE;  Surgeon: Evans Lance, MD;  Location: [facility] CATH LAB;  Service: Cardiovascular;  Laterality: N/A;  . Left heart catheterization with coronary angiogram N/A 10/04/2011    Procedure: LEFT HEART CATHETERIZATION WITH CORONARY ANGIOGRAM;  Surgeon: Burnell Blanks, MD;  Location: [facility] CATH LAB;  Service: Cardiovascular;  Laterality: N/A;    Social History   Social History  . Marital Status: Legally Separated    Spouse Name: N/A  . Number of Children: 5  . Years of Education: N/A   Occupational History  . retired    Social History Main Topics  . Smoking status: Never Smoker   . Smokeless tobacco: Never Used  . Alcohol Use: 0.6 - 1.2 oz/week    1-2 Glasses of wine per week     Comment: socially   . Drug Use: No  . Sexual Activity: No   Other Topics Concern  . Not on file   Social History Narrative   Lives by self, independent on ADL,  retired. Separated from wife.    Daughters:   [name] ( lives in [place]) [phone]   [name] ( lives out of town)  [phone]              Medication List       This list is accurate as of: 09/27/15 11:59 PM.  Always use your most recent med list.               aspirin 81 MG tablet  Take 81 mg by mouth daily.     atenolol 50 MG tablet  Commonly known as:  TENORMIN  TAKE ONE TABLET BY MOUTH TWICE DAILY     digoxin 0.125 MG tablet  Commonly known as:  LANOXIN  TAKE ONE TABLET BY MOUTH ONCE DAILY     EASY TOUCH INSULIN SYRINGE 31G X 5/16" 1 ML Misc  Generic drug:  Insulin Syringe-Needle U-100  USE AS DIRECTED WITH NPH INSULIN     fenofibrate micronized 134 MG capsule  Commonly known as:  LOFIBRA  Take 1 capsule (134 mg total) by mouth daily.     ferrous sulfate 325 (65  FE) MG EC tablet  Take 1 tablet (325 mg total) by mouth daily.     glucose blood test strip  Check blood sugar four times daily.     insulin NPH Human 100 UNIT/ML injection  Commonly known as:  NOVOLIN N RELION  INJECT 55 UNITS UNDER THE SKIN ONE HOUR BEFORE BREAKFAST AND INJECT 42 UNITS UNDER THE SKIN ONE HOUR BEFORE SUPPER     levothyroxine 88 MCG tablet  Commonly known as:  SYNTHROID, LEVOTHROID  TAKE ONE TABLET BY MOUTH ONCE DAILY BEFORE BREAKFAST **PATIENT  NEEDS  APPOINTMENT  FOR  FURTHER  REFILLS**     nitroGLYCERIN 0.4 MG SL tablet  Commonly known as:  NITROSTAT  Place 0.4 mg under the tongue every 5 (five) minutes as needed. Reported on 09/27/2015     omeprazole 40 MG capsule  Commonly known as:  PRILOSEC  TAKE ONE CAPSULE BY MOUTH ONCE DAILY BEFORE BREAKFAST FOR  ACID  REFLUX     onetouch ultrasoft lancets  Check blood sugar four times daily     pravastatin 40 MG tablet  Commonly known as:  PRAVACHOL  TAKE TWO TABLETS BY MOUTH ONCE DAILY     traMADol 50 MG tablet  Commonly known as:  ULTRAM  Take 0.5-1 tablets (25-50 mg total) by mouth every 6 (six) hours as needed.     warfarin 3 MG tablet  Commonly known as:  COUMADIN  TAKE AS DIRECTED BY  COUMADIN  CLINIC           Objective:   Physical Exam BP 132/76 mmHg  Pulse 62  Temp(Src) 98 F (36.7 C) (Oral)  Ht 5\' 7"  (1.702 m)  Wt 178 lb 8 oz (80.967 kg)  BMI 27.95 kg/m2  SpO2 96% General:   Well developed, well nourished . NAD.  HEENT:  Normocephalic . Face symmetric, atraumatic Lungs:  CTA B Normal respiratory effort, no intercostal retractions, no accessory muscle use. Heart: RRR,  no murmur.  No pretibial edema bilaterally  Skin: Not pale. Not jaundice MSK: Right shoulder: Range of motion normal. Left shoulder: Slightly TTP at the deltoid area, not tender at the frontal aspect of the shoulder or AC joint. Passive better than active ROM Neurologic:  alert & oriented X3.  Speech normal, gait  appropriate for age and unassisted Psych--  Cognition and judgment appear  intact.  Cooperative with normal attention span and concentration.  Behavior appropriate. No anxious or depressed appearing.      Assessment & Plan:   Assessment DM CRI Creat ~ 1.3-1.4 HTN Hyperlipidemia Hypothyroidism CV: --CAD --Atrial fibrillation, PPM --AAA GI: Erosive gastritis, GERD, h/o anemia d/t  chronic blood loss BPH Osteopenia B 12 deficiency  Plan: Last visit about a year ago, mostly due to finances, now has Medicare and Medicaid. Will be able to afford medications and OVs more consistently. DM: on NPH 55 units, 42 units. No ambulatory CBGs, check A1c. New glucometer provided Hyperlipidemia: On Pravachol and fenofibrate , check a FLP Hypothyroidism: On Synthroid 88 mg, check a TSH A fib, on Coumadin: Takes 3 mg daily except Wednesday and Saturday takes 1.5 mg. Has not check an INR due to financial constraints. INR today: 2.1. No change B12 deficiency: Check labs Shoulder injury: Stop ibuprofen (fortunately no apparent side effects from it) recommend Tylenol, x-ray, sports medicine referral. Addendum states that Tylenol won't help, low dose of Ultram recommended, watch for drowsiness. RTC 3 months, depending on results

## 2015-09-27 NOTE — Progress Notes (Signed)
Pre visit review using our clinic review tool, if applicable. No additional management support is needed unless otherwise documented below in the visit note. 

## 2015-09-28 ENCOUNTER — Telehealth: Payer: Self-pay | Admitting: Internal Medicine

## 2015-09-28 DIAGNOSIS — Z09 Encounter for follow-up examination after completed treatment for conditions other than malignant neoplasm: Secondary | ICD-10-CM | POA: Insufficient documentation

## 2015-09-28 NOTE — Telephone Encounter (Signed)
Can be reached: [phone]   Reason for call: Pt called for xray results. Informed they are not reviewed yet and he will get a call. Pt states he is anxious for results.

## 2015-09-28 NOTE — Telephone Encounter (Signed)
Pt called and made aware.  He agrees with plan, but says he does not want to take B12 injections.  He wants to know if he can take B12 orally instead, please advise.

## 2015-09-28 NOTE — Telephone Encounter (Signed)
Please advise 

## 2015-09-28 NOTE — Assessment & Plan Note (Signed)
Last visit about a year ago, mostly due to finances, now has Medicare and Medicaid. Will be able to afford medications and OVs more consistently. DM: on NPH 55 units, 42 units. No ambulatory CBGs, check A1c. New glucometer provided Hyperlipidemia: On Pravachol and fenofibrate , check a FLP Hypothyroidism: On Synthroid 88 mg, check a TSH A fib, on Coumadin: Takes 3 mg daily except Wednesday and Saturday takes 1.5 mg. Has not check an INR due to financial constraints. INR today: 2.1. No change B12 deficiency: Check labs Shoulder injury: Stop ibuprofen (fortunately no apparent side effects from it) recommend Tylenol, x-ray, sports medicine referral. Addendum states that Tylenol won't help, low dose of Ultram recommended, watch for drowsiness. RTC 3 months, depending on results

## 2015-09-28 NOTE — Telephone Encounter (Signed)
Okay to start a B12 supplement OTC daily

## 2015-09-28 NOTE — Telephone Encounter (Signed)
Tried calling Pt, unable to leave message, voicemail has not been set up.

## 2015-09-28 NOTE — Telephone Encounter (Signed)
Spoke w/ Pt, informed him of x-ray results and that [name] would call him later this afternoon to discuss lab results and medications. Pt verbalized understanding.

## 2015-09-28 NOTE — Telephone Encounter (Signed)
Advise patient: Diabetes needs better control:  Stop NPH, start Lantus 50 units every night. Send a prescription Start Januvia 100 mg one tablet daily #30 and 5 refills Check blood sugars every morning, call with readings in 2 weeks. Watch for low blood sugars B12 is low, and weekly shots for one month, then one shot monthly. Other labs are satisfactory

## 2015-09-29 ENCOUNTER — Other Ambulatory Visit: Payer: Self-pay | Admitting: Internal Medicine

## 2015-09-29 MED ORDER — INSULIN GLARGINE 100 UNIT/ML SOLOSTAR PEN: PEN_INJECTOR | SUBCUTANEOUS | Status: DC

## 2015-09-29 MED ORDER — SITAGLIPTIN PHOSPHATE 100 MG PO TABS: 100.0000 mg | ORAL_TABLET | Freq: Every day | ORAL | Status: DC

## 2015-09-29 NOTE — Telephone Encounter (Signed)
Meds sent to pharmacy.  Pt made aware.  He was also made aware that he can take a B12 supplement over the counter rather than taking injections.  Pt was very appreciative.  No further needs voiced.

## 2015-10-02 ENCOUNTER — Telehealth: Payer: Self-pay | Admitting: Internal Medicine

## 2015-10-02 MED ORDER — INSULIN PEN NEEDLE 31G X 6 MM MISC: Status: DC

## 2015-10-02 MED ORDER — GLUCOSE BLOOD VI STRP
ORAL_STRIP | Status: DC
Start: 2015-10-02 — End: 2015-10-04

## 2015-10-02 MED ORDER — VITAMIN B-12 1000 MCG PO TABS: 1000.0000 ug | ORAL_TABLET | Freq: Every day | ORAL | Status: DC

## 2015-10-02 MED ORDER — FREESTYLE LITE DEVI: Status: DC

## 2015-10-02 MED ORDER — FREESTYLE LANCETS MISC: Status: DC

## 2015-10-02 NOTE — Telephone Encounter (Signed)
Can be reached: [phone]   Reason for call: Pt requesting call from CMA/nurse for [name]. Pt stated that he did not need to go into with me and wants to talk to the CMA/nurse. Advised pt I would have someone call back.

## 2015-10-02 NOTE — Telephone Encounter (Signed)
Spoke with Pt, he had several issues:  1) Insurance would not pay for One Touch brand device. Informed him I would try sending Freestyle device. 2) No insulin needles were sent to [employer] with the Lantus, apologized and informed him I would. 3) Pt was instructed to start B12 injections but requested he start w/ oral B12 supplements first. He tried getting the supplements but were to expensive OTC. He was wondering if an Rx could be sent to [facility].  Informed him I would call him back once received word from [name] regarding the B12. Pt verbalized understanding.

## 2015-10-02 NOTE — Telephone Encounter (Signed)
B12 listed as Cyanocobalamin in Epic. Okay per [name] to send 1000 mcg 1 tablet daily. Rx sent.

## 2015-10-02 NOTE — Telephone Encounter (Signed)
Okay to try oral B12 for now. I couldn't find a  B12 tablet form in Epic,  if his pharmacist send a prescription I will sign

## 2015-10-03 NOTE — Telephone Encounter (Signed)
Spoke w/ Pt, informed him that device, strips, lancets, insulin needles, and B12 has been sent to [facility]. Informed him to let me know if he has trouble picking up either. Pt verbalized understanding.

## 2015-10-04 MED ORDER — GLUCOSE BLOOD VI STRP: ORAL_STRIP | Status: DC

## 2015-10-04 MED ORDER — TRUE METRIX METER W/DEVICE KIT: PACK | Status: DC

## 2015-10-04 NOTE — Telephone Encounter (Signed)
Spoke w/ Shelton Silvas at [facility], [employer] covers [employer] device and supplies. Rx resent.

## 2015-10-04 NOTE — Telephone Encounter (Signed)
Patient is currently at pharmacy stating [facility] needs additional information and would like nurse to call pharmacy directly.   [place] [place], [place]

## 2015-10-04 NOTE — Addendum Note (Signed)
Addended by: Damita Dunnings D on: 10/04/2015 02:31 PM   Modules accepted: Orders, Medications

## 2015-10-05 ENCOUNTER — Telehealth: Payer: Self-pay | Admitting: Internal Medicine

## 2015-10-05 NOTE — Telephone Encounter (Signed)
Insulin Pen Needle 31G X 6 MM MISC 100 each 12 10/02/2015     Sig:  To use w/ Lantus    Class:  Normal    Comment:  Dx: E11.9    Authorizing Provider:  Colon Branch, MD    Ordering User:  Damita Dunnings, CMA    Already discussed w/ Pt on 10/02/2015, insulin needles were sent to [facility].

## 2015-10-05 NOTE — Telephone Encounter (Signed)
Please follow up to further confirm if need be. Spoke with pt and was informed that he still doesn't have them.    Please advise pt further directly.    Thanks.

## 2015-10-05 NOTE — Telephone Encounter (Signed)
Pt called in because he says [name] prescribed a "new" insulin. Pt says that he also need the needles. Pt would like to have Rx sent to [facility]. Pt says that he hasn't had his insulin in about a week now due to not having needles. He says that he has already made PCP aware.    Pt is requesting a call back confirmation once it is called in to pharmacy.    CB: [phone]

## 2015-10-05 NOTE — Telephone Encounter (Signed)
Spoke w/ Rose Fillers at [facility], informed that they do have the insulin needles for his Lantus. She will call to inform him when ready for pick up.

## 2015-10-17 ENCOUNTER — Other Ambulatory Visit: Payer: Self-pay | Admitting: Internal Medicine

## 2015-10-25 ENCOUNTER — Ambulatory Visit (INDEPENDENT_AMBULATORY_CARE_PROVIDER_SITE_OTHER): Payer: Commercial Managed Care - HMO | Admitting: *Deleted

## 2015-10-25 ENCOUNTER — Telehealth: Payer: Self-pay | Admitting: *Deleted

## 2015-10-25 DIAGNOSIS — I4891 Unspecified atrial fibrillation: Secondary | ICD-10-CM

## 2015-10-25 LAB — POCT INR: INR: 1.7

## 2015-10-25 NOTE — Patient Instructions (Addendum)
Per [name]: Take coumadin 3 mg daily, except take 1.5 mg on Wednesdays and Saturdays. Return in 3 weeks for INR check.

## 2015-10-25 NOTE — Telephone Encounter (Signed)
Demographic information was verified when pt checked in for appt today. His address is in [place], so this office would be the closest [place] location.

## 2015-10-25 NOTE — Telephone Encounter (Signed)
Could you check where he lives and offer a transfer to a closer office? I wonder if that could help.

## 2015-10-25 NOTE — Telephone Encounter (Signed)
Pt presented to clinic today for INR check w/ RN. During visit, pt stated that he needed to speak to [name] or his [place] today. Explained to pt that he would not be able to speak to them today as they are in clinic seeing patients and have a full schedule. Pt is concerned about the bills he received from his last OV. The bills totaled over $300 and he states he has outlived his income and lives off of about $1000 monthly. He stated there is "no way" he can pay these bills. I made a copy of these bills and placed it on Ebony's desk for review. Pt advised that Charlena Cross is out the office today.  He also reported when he checked in for nurse visit, we did not have his Medicaid on file. He gave front desk staff the ID # and he says they were able to process it. However, he is not sure if Medicaid was filed to cover his last OV, or if only Medicare was filed. He is very frustrated/stressed by the burden of these bills. He reports he has had similar problems in the past (see previous phone notes), and it took 6 months for someone to call him back; says he spoke with [name] in the past and has "no use for her." He voiced additional frustration about having to call and speak with people in [place] regarding his bills and questioned, "why isn't there anyone in [facility] who does billing? I don't know anyone in [place]." He would like a call back to discuss if Medicaid was applied to his previous OV. If not, can bill be re-filed? If no to both, is there anything else that can be done to lessen the burden of this bill?   Pt reports that if his care is going to continue to be this expensive, then he will stop coming. I discussed w/ pt the importance of following-up as advised so that we can provide him with quality care and manage his medications safely and advised that we would not be able to continue providing refills if he did not follow-up, to which he stated, "then I guess I'll just have to do without" and then reported  that his care has been much more expensive since [name] moved from [place] office to this location. He declined to schedule follow-up nurse visit INR check while in office today, pt was advised that he will need to call the office to schedule appt in 3 weeks per [name], and this was also noted and highlighted on AVS. Routing to front office team lead, and PCP for FYI.

## 2015-10-25 NOTE — Progress Notes (Signed)
Pre visit review using our clinic review tool, if applicable. No additional management support is needed unless otherwise documented below in the visit note.  Per [name]: Take coumadin 3 mg daily, except take 1.5 mg on Wednesdays and Saturdays. Return in 3 weeks for INR check.  Next appointment: Pt declined to schedule d/t financial/billing concerns. See phone note dated for same.  Dorrene German, RN

## 2015-10-27 ENCOUNTER — Other Ambulatory Visit: Payer: Self-pay

## 2015-10-27 MED ORDER — ATENOLOL 50 MG PO TABS: 50.0000 mg | ORAL_TABLET | Freq: Two times a day (BID) | ORAL | Status: DC

## 2015-11-01 ENCOUNTER — Other Ambulatory Visit: Payer: Self-pay | Admitting: Internal Medicine

## 2015-11-01 NOTE — Telephone Encounter (Signed)
Pt needs to schedule a follow up appointment for any future refills. [phone] 

## 2015-12-13 ENCOUNTER — Telehealth: Payer: Self-pay

## 2015-12-13 MED ORDER — METOPROLOL TARTRATE 25 MG PO TABS: 25.0000 mg | ORAL_TABLET | Freq: Two times a day (BID) | ORAL | 2 refills | Status: DC

## 2015-12-13 NOTE — Telephone Encounter (Signed)
For now, switch to metoprolol 25 mg one tablet twice a day.#60, 2 RF Also, call patient ---->  Due for an INR check. If is less expensive to him, lets bring him for an INR at the lab not a nurse visit. We cannot refill coumadin without checking an INR.

## 2015-12-13 NOTE — Telephone Encounter (Signed)
[name], Pt is due for INR visit, can we try to get Pt in to have levels checked?

## 2015-12-13 NOTE — Telephone Encounter (Signed)
Received fax from [facility]. Atenolol 50mg  tab is on backorder until approximately the end of August. Pt is out of medication. Pharmacy needing order to change medication. Please advise.

## 2015-12-13 NOTE — Telephone Encounter (Signed)
Atenolol 50 mg d/c, Metoprolol 25mg  1 tablet bid, #60 and 2RF sent to [facility].

## 2015-12-18 NOTE — Telephone Encounter (Signed)
Nurse visit scheduled for 12/20/15 at 10:45 AM to have INR checked.

## 2015-12-20 ENCOUNTER — Ambulatory Visit (INDEPENDENT_AMBULATORY_CARE_PROVIDER_SITE_OTHER): Payer: Commercial Managed Care - HMO | Admitting: *Deleted

## 2015-12-20 DIAGNOSIS — I4891 Unspecified atrial fibrillation: Secondary | ICD-10-CM

## 2015-12-20 LAB — POCT INR: INR: 3

## 2015-12-20 NOTE — Progress Notes (Signed)
Pre visit review using our clinic review tool, if applicable. No additional management support is needed unless otherwise documented below in the visit note.  INR today 3.0.  Per [name]: Take coumadin 3 mg daily, except take 1.5 mg on Wednesdays and Saturdays. Return in 4 weeks for INR check.  Next appointment: 01/17/16  Dorrene German, RN

## 2015-12-20 NOTE — Patient Instructions (Signed)
Per [name]: Take coumadin 3 mg daily, except take 1.5 mg on Wednesdays and Saturdays. Return in 4 weeks for INR check.

## 2015-12-28 ENCOUNTER — Ambulatory Visit (INDEPENDENT_AMBULATORY_CARE_PROVIDER_SITE_OTHER): Payer: Commercial Managed Care - HMO | Admitting: Internal Medicine

## 2015-12-28 ENCOUNTER — Encounter: Payer: Self-pay | Admitting: Internal Medicine

## 2015-12-28 VITALS — BP 128/72 | HR 62 | Temp 98.2°F | Resp 14 | Ht 67.0 in | Wt 171.4 lb

## 2015-12-28 DIAGNOSIS — I10 Essential (primary) hypertension: Secondary | ICD-10-CM | POA: Diagnosis not present

## 2015-12-28 DIAGNOSIS — E538 Deficiency of other specified B group vitamins: Secondary | ICD-10-CM | POA: Diagnosis not present

## 2015-12-28 DIAGNOSIS — E118 Type 2 diabetes mellitus with unspecified complications: Secondary | ICD-10-CM | POA: Diagnosis not present

## 2015-12-28 LAB — HEMOGLOBIN A1C: HEMOGLOBIN A1C: 10.1 % — AB (ref 4.6–6.5)

## 2015-12-28 LAB — MICROALBUMIN / CREATININE URINE RATIO
CREATININE, U: 76.2 mg/dL
MICROALB/CREAT RATIO: 0.9 mg/g (ref 0.0–30.0)

## 2015-12-28 LAB — VITAMIN B12: VITAMIN B 12: 784 pg/mL (ref 211–911)

## 2015-12-28 NOTE — Progress Notes (Signed)
Pre visit review using our clinic review tool, if applicable. No additional management support is needed unless otherwise documented below in the visit note. 

## 2015-12-28 NOTE — Patient Instructions (Addendum)
GO TO THE LAB : Get the blood work     GO TO THE FRONT DESK Schedule your next appointment for a  yearly checkup in 3 months  Schedule a nurse visit, bring your glucometer and older supplies for more training.  -------- Take your B12 supplement every day.    Check your blood sugars every morning, call with your readings in 2 weeks    Diabetes and Foot Care Diabetes may cause you to have problems because of poor blood supply (circulation) to your feet and legs. This may cause the skin on your feet to become thinner, break easier, and heal more slowly. Your skin may become dry, and the skin may peel and crack. You may also have nerve damage in your legs and feet causing decreased feeling in them. You may not notice minor injuries to your feet that could lead to infections or more serious problems. Taking care of your feet is one of the most important things you can do for yourself.  HOME CARE INSTRUCTIONS  Wear shoes at all times, even in the house. Do not go barefoot. Bare feet are easily injured.  Check your feet daily for blisters, cuts, and redness. If you cannot see the bottom of your feet, use a mirror or ask someone for help.  Wash your feet with warm water (do not use hot water) and mild soap. Then pat your feet and the areas between your toes until they are completely dry. Do not soak your feet as this can dry your skin.  Apply a moisturizing lotion or petroleum jelly (that does not contain alcohol and is unscented) to the skin on your feet and to dry, brittle toenails. Do not apply lotion between your toes.  Trim your toenails straight across. Do not dig under them or around the cuticle. File the edges of your nails with an emery board or nail file.  Do not cut corns or calluses or try to remove them with medicine.  Wear clean socks or stockings every day. Make sure they are not too tight. Do not wear knee-high stockings since they may decrease blood flow to your legs.  Wear  shoes that fit properly and have enough cushioning. To break in new shoes, wear them for just a few hours a day. This prevents you from injuring your feet. Always look in your shoes before you put them on to be sure there are no objects inside.  Do not cross your legs. This may decrease the blood flow to your feet.  If you find a minor scrape, cut, or break in the skin on your feet, keep it and the skin around it clean and dry. These areas may be cleansed with mild soap and water. Do not cleanse the area with peroxide, alcohol, or iodine.  When you remove an adhesive bandage, be sure not to damage the skin around it.  If you have a wound, look at it several times a day to make sure it is healing.  Do not use heating pads or hot water bottles. They may burn your skin. If you have lost feeling in your feet or legs, you may not know it is happening until it is too late.  Make sure your health care provider performs a complete foot exam at least annually or more often if you have foot problems. Report any cuts, sores, or bruises to your health care provider immediately. SEEK MEDICAL CARE IF:   You have an injury that is not  healing.  You have cuts or breaks in the skin.  You have an ingrown nail.  You notice redness on your legs or feet.  You feel burning or tingling in your legs or feet.  You have pain or cramps in your legs and feet.  Your legs or feet are numb.  Your feet always feel cold. SEEK IMMEDIATE MEDICAL CARE IF:   There is increasing redness, swelling, or pain in or around a wound.  There is a red line that goes up your leg.  Pus is coming from a wound.  You develop a fever or as directed by your health care provider.  You notice a bad smell coming from an ulcer or wound.   This information is not intended to replace advice given to you by your health care provider. Make sure you discuss any questions you have with your health care provider.   Document Released:  04/26/2000 Document Revised: 12/30/2012 Document Reviewed: 10/06/2012 Elsevier Interactive Patient Education [employer].

## 2015-12-28 NOTE — Progress Notes (Signed)
Subjective:    Patient ID: [name], male    DOB: 01/17/1926, 82 y.o.   MRN: [id number]  DOS:  12/28/2015 Type of visit - description :  Routine visit Interval history:  DM: Good medication compliance, has a glucometer but apparently is having trouble using it. No ambulatory CBGs readings available. B12 deficiency: Declined shots, on oral supplements HTN: Good med compliance, BP today is very good MSK: Request a parking sticker, having difficulty walking long distances due to DJD.  Review of Systems  denies chest pain or difficulty breathing No nausea, vomiting, the stools or abdominal pain. No blood in the urine.  Past Medical History:  Diagnosis Date  . AAA (abdominal aortic aneurysm) ([place])   . Anemia due to chronic blood loss 11/25/2008   Recurrent over the years EGD and colonoscopy x 2 each 2004 and 2010 without cause    . Atrial fibrillation ([place])   . BPH (benign prostatic hyperplasia)    reports aprocedure (TURP) remotely in [place].Marland KitchenNo futher f/u w/ urology  . CAD (coronary artery disease)   . Diabetes mellitus, type 2 ([place])   . Diverticulosis    left colon  . Erosive gastritis   . GERD (gastroesophageal reflux disease)   . Hyperlipidemia   . Hypertension   . Hypothyroidism   . Insomnia    transient  . Osteopenia    dexa 2-11  . Vitamin B12 deficiency     Past Surgical History:  Procedure Laterality Date  . CARDIAC CATHETERIZATION  01/31/06, 09/25/10, 09-2011  . CATARACT EXTRACTION     right  . CHOLECYSTECTOMY, LAPAROSCOPIC    . COLONOSCOPY     multiple  . CORONARY ARTERY BYPASS GRAFT     1999 stents in 2000  . ESOPHAGOGASTRODUODENOSCOPY     multiple  . GIVENS CAPSULE STUDY N/A 11/04/2012   Procedure: GIVENS CAPSULE STUDY;  Surgeon: Gatha Mayer, MD;  Location: [facility] ENDOSCOPY;  Service: Endoscopy;  Laterality: N/A;  . inguinal herniorrhaphies     bilateral  . LEFT HEART CATHETERIZATION WITH CORONARY ANGIOGRAM N/A 10/04/2011   Procedure: LEFT HEART  CATHETERIZATION WITH CORONARY ANGIOGRAM;  Surgeon: Burnell Blanks, MD;  Location: [facility] CATH LAB;  Service: Cardiovascular;  Laterality: N/A;  . pacemaker  [place], 2013   medtronic minix [id number]  . PENILE PROSTHESIS IMPLANT  1992  . PERMANENT PACEMAKER GENERATOR CHANGE N/A 06/05/2011   Procedure: PERMANENT PACEMAKER GENERATOR CHANGE;  Surgeon: Evans Lance, MD;  Location: [facility] CATH LAB;  Service: Cardiovascular;  Laterality: N/A;  . PROSTATECTOMY     transurethral  . right hip replacement    . TRANSTHORACIC ECHOCARDIOGRAM  12/2006    Social History   Social History  . Marital status: Legally Separated    Spouse name: N/A  . Number of children: 5  . Years of education: N/A   Occupational History  . retired Retired   Social History Main Topics  . Smoking status: Never Smoker  . Smokeless tobacco: Never Used  . Alcohol use 0.6 - 1.2 oz/week    1 - 2 Glasses of wine per week     Comment: socially   . Drug use: No  . Sexual activity: No   Other Topics Concern  . Not on file   Social History Narrative   Lives by self, independent on ADL, retired. Separated from wife.    Daughters:   [name] ( lives in [place]) [phone]   [name] ( lives out of town)  [phone]  [phone]              Medication List       Accurate as of 12/28/15 11:59 PM. Always use your most recent med list.          aspirin 81 MG tablet Take 81 mg by mouth daily.   digoxin 0.125 MG tablet Commonly known as:  LANOXIN TAKE ONE TABLET BY MOUTH ONCE DAILY   EASY TOUCH INSULIN SYRINGE 31G X 5/16" 1 ML Misc Generic drug:  Insulin Syringe-Needle U-100 USE AS DIRECTED WITH NPH INSULIN   fenofibrate micronized 134 MG capsule Commonly known as:  LOFIBRA Take 1 capsule (134 mg total) by mouth daily.   ferrous sulfate 325 (65 FE) MG EC tablet Take 1 tablet (325 mg total) by mouth daily.   glucose blood test strip Commonly known as:  TRUE METRIX BLOOD GLUCOSE TEST Check blood sugar four times  daily.   Insulin Glargine 100 UNIT/ML Solostar Pen Commonly known as:  LANTUS Inject 50 units into the skin every night.   Insulin Pen Needle 31G X 6 MM Misc To use w/ Lantus   levothyroxine 88 MCG tablet Commonly known as:  SYNTHROID, LEVOTHROID Take 1 tablet (88 mcg total) by mouth daily before breakfast.   metoprolol tartrate 25 MG tablet Commonly known as:  LOPRESSOR Take 1 tablet (25 mg total) by mouth 2 (two) times daily.   nitroGLYCERIN 0.4 MG SL tablet Commonly known as:  NITROSTAT Place 0.4 mg under the tongue every 5 (five) minutes as needed. Reported on 09/27/2015   omeprazole 40 MG capsule Commonly known as:  PRILOSEC Take 1 capsule (40 mg total) by mouth daily.   pravastatin 40 MG tablet Commonly known as:  PRAVACHOL Take 2 tablets (80 mg total) by mouth daily.   sitaGLIPtin 100 MG tablet Commonly known as:  JANUVIA Take 1 tablet (100 mg total) by mouth daily.   TRUE METRIX METER w/Device Kit Check blood sugar four times daily.   vitamin B-12 1000 MCG tablet Commonly known as:  CYANOCOBALAMIN Take 1 tablet (1,000 mcg total) by mouth daily.   warfarin 3 MG tablet Commonly known as:  COUMADIN Take as directed by [facility]mad[facility] Clinic.          Objective:   Physical Exam BP 128/72 (BP Location: Left Arm, Patient Position: Sitting, Cuff Size: Normal)   Pulse 62   Temp 98.2 F (36.8 C) (Oral)   Resp 14   Ht 5' 7" (1.702 m)   Wt 171 lb 6 oz (77.7 kg)   SpO2 96%   BMI 26.84 kg/m  General:   Well developed, well nourished . NAD.  HEENT:  Normocephalic . Face symmetric, atraumatic Lungs:  CTA B Normal respiratory effort, no intercostal retractions, no accessory muscle use. Heart: RRR,  no murmur.  No pretibial edema bilaterally  Skin: Not pale. Not jaundice DIABETIC FEET EXAM: No lower extremity edema Normal pedal pulses bilaterally Skin normal, nails normal, no calluses Pinprick examination of the feet : slt decreased through Neurologic:    alert & oriented X3.  Speech normal, gait appropriate for age and unassisted Psych--  Cognition and judgment appear intact.  Cooperative with normal attention span and concentration.  Behavior appropriate. No anxious or depressed appearing.      Assessment & Plan:   Assessment DM, + neuropahty per exam 12-2015 CRI Creat ~ 1.3-1.4 HTN Hyperlipidemia Hypothyroidism CV: --CAD --Atrial fibrillation, PPM --AAA GI: Erosive gastritis, GERD, h/o anemia d/t  chronic blood loss  BPH Osteopenia- declined dexa 2015 B 12 deficiency  Plan: DM: Last A1c 10.4, insulin was changed to Lantus 50 units, added Januvia. No ambulatory CBGs. Will check A1c, continue with present care, encouraged to bring his glucometer to be trained on how to use it. + neuropathy, feet care discussed  Hyperlipidemia: TG were slightly elevated. Otherwise good results. HTN: Due to no availability atenolol was switched to metoprolol, good compliance, no side effects. BP today is very good B12 deficiency: Elected not to do shots, on oral supplements, check labs. Coumadin management: Next check 01/20/2016 MSK: Unable to walk much mostly due to hip pain, request a parking sticker. Will do. Shoulder pain essentially resolved. RTC 3 months. CPX.

## 2015-12-29 NOTE — Assessment & Plan Note (Signed)
DM: Last A1c 10.4, insulin was changed to Lantus 50 units, added Januvia. No ambulatory CBGs. Will check A1c, continue with present care, encouraged to bring his glucometer to be trained on how to use it. + neuropathy, feet care discussed  Hyperlipidemia: TG were slightly elevated. Otherwise good results. HTN: Due to no availability atenolol was switched to metoprolol, good compliance, no side effects. BP today is very good B12 deficiency: Elected not to do shots, on oral supplements, check labs. Coumadin management: Next check 01/20/2016 MSK: Unable to walk much mostly due to hip pain, request a parking sticker. Will do. Shoulder pain essentially resolved. RTC 3 months. CPX.

## 2016-01-01 NOTE — Addendum Note (Signed)
Addended byDamita Dunnings D on: 01/01/2016 12:56 PM   Modules accepted: Orders

## 2016-01-17 ENCOUNTER — Other Ambulatory Visit: Payer: Self-pay | Admitting: Internal Medicine

## 2016-01-17 ENCOUNTER — Ambulatory Visit (INDEPENDENT_AMBULATORY_CARE_PROVIDER_SITE_OTHER): Payer: Commercial Managed Care - HMO | Admitting: *Deleted

## 2016-01-17 DIAGNOSIS — I4891 Unspecified atrial fibrillation: Secondary | ICD-10-CM | POA: Diagnosis not present

## 2016-01-17 LAB — POCT INR: INR: 2.5

## 2016-01-17 NOTE — Progress Notes (Addendum)
Pre visit review using our clinic review tool, if applicable. No additional management support is needed unless otherwise documented below in the visit note.  Next appointment: 02/14/16  Dorrene German, RN   Kathlene November, MD

## 2016-01-31 ENCOUNTER — Other Ambulatory Visit: Payer: Self-pay | Admitting: Internal Medicine

## 2016-02-14 ENCOUNTER — Ambulatory Visit (INDEPENDENT_AMBULATORY_CARE_PROVIDER_SITE_OTHER): Payer: Commercial Managed Care - HMO | Admitting: Behavioral Health

## 2016-02-14 DIAGNOSIS — I4891 Unspecified atrial fibrillation: Secondary | ICD-10-CM | POA: Diagnosis not present

## 2016-02-14 LAB — POCT INR: INR: 1.8

## 2016-02-14 NOTE — Patient Instructions (Signed)
Per [name]: Continue taking coumadin 3 mg daily, except take 1.5 mg on Wednesdays and Saturdays. Return in 2 weeks for INR check.

## 2016-02-14 NOTE — Progress Notes (Addendum)
Pre visit review using our clinic review tool, if applicable. No additional management support is needed unless otherwise documented below in the visit note.  Patient presents in office for INR check. Reviewed medication & current regimen with the patient. He reported that he missed one dose of Coumadin last week. No other positive findings were addressed. Today's reading was 1.8.  Per [name]: Continue taking coumadin 3 mg daily, except take 1.5 mg on Wednesdays and Saturdays. Return in 2 weeks for INR check.  Informed patient of the provider's instructions. He verbalized understanding and did not have any questions or concerns prior to leaving the nurse visit.  Next appointment scheduled for 02/29/16 at 10:30 AM.  Kathlene November, MD

## 2016-02-23 ENCOUNTER — Other Ambulatory Visit: Payer: Self-pay

## 2016-02-23 MED ORDER — METOPROLOL TARTRATE 25 MG PO TABS: 25.0000 mg | ORAL_TABLET | Freq: Two times a day (BID) | ORAL | 0 refills | Status: DC

## 2016-02-29 ENCOUNTER — Ambulatory Visit (INDEPENDENT_AMBULATORY_CARE_PROVIDER_SITE_OTHER): Payer: Commercial Managed Care - HMO | Admitting: Behavioral Health

## 2016-02-29 DIAGNOSIS — I4891 Unspecified atrial fibrillation: Secondary | ICD-10-CM

## 2016-02-29 LAB — POCT INR: INR: 2.8

## 2016-02-29 NOTE — Patient Instructions (Signed)
Per [name]: Continue taking coumadin 3 mg daily, except take 1.5 mg on Wednesdays and Saturdays. Return in 4  weeks for INR check.

## 2016-02-29 NOTE — Progress Notes (Addendum)
Pre visit review using our clinic review tool, if applicable. No additional management support is needed unless otherwise documented below in the visit note.   Pt presents to clinic for INR check. We have reviewed medication and ensured he is taking current regimen. All pt findings were negative per pt. INR today 2.8. Dr.Paz notified and instructs for pt to cont current regimen and follow up in 4 wk. Pt verbalizes understanding.   Kathlene November, MD

## 2016-03-02 ENCOUNTER — Other Ambulatory Visit: Payer: Self-pay | Admitting: Internal Medicine

## 2016-03-28 ENCOUNTER — Ambulatory Visit (INDEPENDENT_AMBULATORY_CARE_PROVIDER_SITE_OTHER): Payer: Commercial Managed Care - HMO | Admitting: Behavioral Health

## 2016-03-28 DIAGNOSIS — I4891 Unspecified atrial fibrillation: Secondary | ICD-10-CM

## 2016-03-28 LAB — POCT INR: INR: 4

## 2016-03-28 NOTE — Progress Notes (Addendum)
Pre visit review using our clinic review tool, if applicable. No additional management support is needed unless otherwise documented below in the visit note.  Patient presents in clinic for INR check. Reviewed medication & regimen. Patient voiced that he may have taken an extra dose of Coumadin accidentally, but not completely sure that he did. No other positive findings were reported. Today's INR reading was 4.0.  Per [name]: Skip the next two doses of Coumadin & then continue taking coumadin 3 mg daily, except take 1.5 mg on Wednesdays and Saturdays. Return in 3 weeks for INR check. Please be sure to stay in compliance with the advised regimen.  Informed patient of the provider's instructions. He verbalized understanding and did not have any further questions or concerns.   Next appointment scheduled for 04/18/16 at 10:00 AM.  Kathlene November, MD

## 2016-03-28 NOTE — Patient Instructions (Addendum)
Per [name]: Skip the next two doses of Coumadin & then continue taking coumadin 3 mg daily, except take 1.5 mg on Wednesdays and Saturdays. Return in 3 weeks for INR check. Please be sure to stay in compliance with the advised regimen.

## 2016-04-02 ENCOUNTER — Other Ambulatory Visit: Payer: Self-pay | Admitting: Internal Medicine

## 2016-04-02 DIAGNOSIS — E1310 Other specified diabetes mellitus with ketoacidosis without coma: Secondary | ICD-10-CM

## 2016-04-02 NOTE — Telephone Encounter (Signed)
Refill Januvia 2 months. Try to schedule endocrinology again

## 2016-04-02 NOTE — Telephone Encounter (Signed)
Pt requesting refill on Januvia. Pt was referred to Endo but they never received call back to schedule. Please advise.

## 2016-04-02 NOTE — Telephone Encounter (Signed)
Rx sent. Endo referral placed.

## 2016-04-02 NOTE — Addendum Note (Signed)
Addended byDamita Dunnings D on: 04/02/2016 08:23 AM   Modules accepted: Orders

## 2016-04-17 ENCOUNTER — Encounter: Payer: Self-pay | Admitting: Endocrinology

## 2016-04-18 ENCOUNTER — Ambulatory Visit (INDEPENDENT_AMBULATORY_CARE_PROVIDER_SITE_OTHER): Payer: Commercial Managed Care - HMO

## 2016-04-18 DIAGNOSIS — Z23 Encounter for immunization: Secondary | ICD-10-CM | POA: Diagnosis not present

## 2016-04-18 DIAGNOSIS — I4891 Unspecified atrial fibrillation: Secondary | ICD-10-CM | POA: Diagnosis not present

## 2016-04-18 LAB — POCT INR: INR: 2.1

## 2016-04-18 NOTE — Progress Notes (Addendum)
Pre visit review using our clinic review tool, if applicable. No additional management support is needed unless otherwise documented below in the visit note.  Pt in for INR check.  INR today: 2.1; INR last visit 03/28/16:  4.0.  During visit, pt also reported a fall 10 days ago.  States he was leaving his bathroom and walking into his bedroom when he lost his balance and fell backwards.  He hit his head on door knob and back on floor.  Skin intact on head.  However, bruising noted on left shoulder and bilateral forearm.  Pt denied any pain today.    The above was discussed with [name].  His recommendations were as follows:  Per [name]: Continue taking Coumadin 3 mg daily, except take 1.5 mg on Wednesdays and Saturdays. Return in 3 weeks for INR check. Please be sure to stay in compliance with the advised regimen.  Also, Per [name], given recent fall, if you experience severe headache, blurred vision, nausea/vomiting, neck pain, please go immediately to the ER.     The same was discussed with the patient.  He stated understanding and agreed to comply.    Next INR appt scheduled:  Thursday, 05/09/16 at 10:15 am  Pt also requested a flu vaccine.  High dose flu shot given in left deltoid.  Pt tolerated injection well.  No reaction noted upon leaving the clinic.    Kathlene November, MD

## 2016-04-18 NOTE — Patient Instructions (Addendum)
Per [name]: Continue taking Coumadin 3 mg daily, except take 1.5 mg on Wednesdays and Saturdays. Return in 3 weeks for INR check. Please be sure to stay in compliance with the advised regimen.  Also, Per [name], given recent fall, if you experience severe headache, blurred vision, nausea/vomiting, neck pain, please go immediately to the ER.        Next INR appt scheduled:  Thursday, 05/09/16 at 10:15 am

## 2016-05-02 ENCOUNTER — Telehealth: Payer: Self-pay

## 2016-05-02 NOTE — Telephone Encounter (Signed)
Received Medical Clearance for Dental Services form from [facility]. Form completed and faxed to [phone]. Form sent for scanning.

## 2016-05-04 ENCOUNTER — Other Ambulatory Visit: Payer: Self-pay | Admitting: Internal Medicine

## 2016-05-09 ENCOUNTER — Ambulatory Visit (INDEPENDENT_AMBULATORY_CARE_PROVIDER_SITE_OTHER): Payer: Commercial Managed Care - HMO | Admitting: Behavioral Health

## 2016-05-09 DIAGNOSIS — I4891 Unspecified atrial fibrillation: Secondary | ICD-10-CM

## 2016-05-09 LAB — POCT INR: INR: 2.7

## 2016-05-09 NOTE — Progress Notes (Addendum)
Pre visit review using our clinic review tool, if applicable. No additional management support is needed unless otherwise documented below in the visit note.  Patient came in clinic for INR check. Reviewed medication & current regimen with the patient. He reported no positive findings. Today's INR reading was 2.7.  Per [name]: Continue taking Coumadin 3 mg daily, except take 1.5 mg on Wednesdays and Saturdays. Return in 4 weeks for INR check.  Informed patient of the provider's recommendations. He voiced understanding and did not have any concerns before leaving the nurse visit.  Next appointment scheduled for 06/06/16 at 10:30 AM.  Kathlene November, MD

## 2016-05-09 NOTE — Patient Instructions (Signed)
Per [name]: Continue taking Coumadin 3 mg daily, except take 1.5 mg on Wednesdays and Saturdays. Return in 4 weeks for INR check.

## 2016-06-06 ENCOUNTER — Telehealth: Payer: Self-pay

## 2016-06-06 ENCOUNTER — Ambulatory Visit (INDEPENDENT_AMBULATORY_CARE_PROVIDER_SITE_OTHER): Payer: Medicare HMO | Admitting: Behavioral Health

## 2016-06-06 DIAGNOSIS — I4891 Unspecified atrial fibrillation: Secondary | ICD-10-CM

## 2016-06-06 LAB — POCT INR: INR: 2.3

## 2016-06-06 NOTE — Progress Notes (Addendum)
Pre visit review using our clinic review tool, if applicable. No additional management support is needed unless otherwise documented below in the visit note.  Patient presents in office for INR check. Reviewed medication & current regimen. All patient findings were negative. Today's INR reading was 2.3.  Advised patient to continue taking Coumadin 3 mg daily, except take 1.5 mg on Wednesdays and Saturdays. Return in 4 weeks for INR check.  He voiced understanding. Next appointment 07/04/16 at 10:00 AM.  Kathlene November, MD

## 2016-06-06 NOTE — Telephone Encounter (Signed)
Called, spoke with pt to inform Coumadin clearance should come from the order provider, [name]. Infomed pt Dr. Lovena Le will fill out cardiac clearance document at next appt on 06/17/16.

## 2016-06-06 NOTE — Patient Instructions (Signed)
Continue taking Coumadin 3 mg daily, except take 1.5 mg on Wednesdays and Saturdays. Return in 4 weeks for INR check.

## 2016-06-06 NOTE — Telephone Encounter (Signed)
Sent clearance documents to medical records to be faxed to [place] [phone]. Dr. Lovena Le stated to contact ordering provider for Coumadin clearance ([name]) and cardiac clearance document will be filled out after pt's f/u appt on 06/17/16.

## 2016-06-07 ENCOUNTER — Telehealth: Payer: Self-pay

## 2016-06-07 NOTE — Telephone Encounter (Signed)
Please schedule office visit.

## 2016-06-07 NOTE — Telephone Encounter (Signed)
Received surgical clearance form from [facility]. Pt needing two surgeries. 1st involves extraction of teeth #'s 13,14, and 15 under local anesthesia. 2nd involves extraction of teeth #'s 5 and 9 under local anesthesia. Form forwarded to PCP for completion.

## 2016-06-10 NOTE — Telephone Encounter (Signed)
Shiquita-can you call Pt to schedule surgical clearance appt (30 mins)- Pt having dental procedure. Thank you.

## 2016-06-11 NOTE — Telephone Encounter (Signed)
Pt has been scheduled.  °

## 2016-06-11 NOTE — Telephone Encounter (Signed)
Insurance called to get the status of patient's clearance. Informed her we have asked patient to come in for an appointment. Routing to scheduler to schedule

## 2016-06-12 ENCOUNTER — Encounter: Payer: Self-pay | Admitting: *Deleted

## 2016-06-12 NOTE — Telephone Encounter (Signed)
Noted  

## 2016-06-17 ENCOUNTER — Ambulatory Visit: Payer: Medicare HMO | Admitting: Internal Medicine

## 2016-06-17 ENCOUNTER — Encounter: Payer: Self-pay | Admitting: Internal Medicine

## 2016-06-17 ENCOUNTER — Ambulatory Visit (INDEPENDENT_AMBULATORY_CARE_PROVIDER_SITE_OTHER): Payer: Medicare HMO | Admitting: Internal Medicine

## 2016-06-17 DIAGNOSIS — I482 Chronic atrial fibrillation, unspecified: Secondary | ICD-10-CM

## 2016-06-17 LAB — CUP PACEART INCLINIC DEVICE CHECK
Battery Impedance: 2069 Ohm
Battery Remaining Longevity: 34 mo
Battery Voltage: 2.73 V
Brady Statistic RV Percent Paced: 94 %
Date Time Interrogation Session: 20180205133510
Implantable Lead Implant Date: 19950413
Implantable Lead Location: 753860
Lead Channel Impedance Value: 0 Ohm
Lead Channel Sensing Intrinsic Amplitude: 11.2 mV
Lead Channel Setting Pacing Amplitude: 2.5 V
MDC IDC MSMT LEADCHNL RV IMPEDANCE VALUE: 547 Ohm
MDC IDC MSMT LEADCHNL RV PACING THRESHOLD AMPLITUDE: 0.75 V
MDC IDC MSMT LEADCHNL RV PACING THRESHOLD AMPLITUDE: 0.75 V
MDC IDC MSMT LEADCHNL RV PACING THRESHOLD PULSEWIDTH: 0.4 ms
MDC IDC MSMT LEADCHNL RV PACING THRESHOLD PULSEWIDTH: 0.4 ms
MDC IDC PG IMPLANT DT: 20130123
MDC IDC SET LEADCHNL RV PACING PULSEWIDTH: 0.4 ms
MDC IDC SET LEADCHNL RV SENSING SENSITIVITY: 4 mV

## 2016-06-17 MED ORDER — DIGOXIN 125 MCG PO TABS: 0.0625 mg | ORAL_TABLET | Freq: Every day | ORAL | 3 refills | Status: DC

## 2016-06-17 NOTE — Patient Instructions (Signed)
Medication Instructions:  Your physician has recommended you make the following change in your medication:  1) DECREASE Digoxin to 0.0625 mg 1/2 tablet daily  Labwork: None Ordered   Testing/Procedures: None Ordered   Follow-Up: Your physician wants you to follow-up in: 1 year with Dr. Lovena Le. You will receive a reminder letter in the mail two months in advance. If you don't receive a letter, please call our office to schedule the follow-up appointment.  Remote monitoring is used to monitor your Pacemaker from home. This monitoring reduces the number of office visits required to check your device to one time per year. It allows [place] to keep an eye on the functioning of your device to ensure it is working properly. You are scheduled for a device check from home on 09/16/16. You may send your transmission at any time that day. If you have a wireless device, the transmission will be sent automatically. After your physician reviews your transmission, you will receive a postcard with your next transmission date.    Any Other Special Instructions Will Be Listed Below (If Applicable).     If you need a refill on your cardiac medications before your next appointment, please call your pharmacy.

## 2016-06-17 NOTE — Progress Notes (Signed)
HPI [name] returns today for followup. He is an 83 year old man with a history of symptomatic bradycardia, status post permanent pacemaker insertion over 30 years ago. He is status post his second replacement approximately 3 years ago. In the interim, no chest pain, shortness of breath, or syncope. No peripheral edema. HIe denies fevers or chills, night sweats, or recurrent nausea or vomiting. He notes that he has had some trouble sleeping and is very sedentary.  Allergies  Allergen Reactions  . Ace Inhibitors Other (See Comments)    REACTION: cough  . Codeine Phosphate Nausea Only    REACTION: nausea  . Hydrochlorothiazide W-Triamterene Other (See Comments)    REACTION: cramps     Current Outpatient Prescriptions  Medication Sig Dispense Refill  . aspirin 81 MG tablet Take 81 mg by mouth daily.    . Blood Glucose Monitoring Suppl (TRUE METRIX METER) w/Device KIT Check blood sugar four times daily. 1 kit 0  . EASY TOUCH INSULIN SYRINGE 31G X 5/16" 1 ML MISC USE AS DIRECTED WITH NPH INSULIN 100 each 5  . fenofibrate micronized (LOFIBRA) 134 MG capsule Take 1 capsule (134 mg total) by mouth daily. 90 capsule 2  . ferrous sulfate 325 (65 FE) MG EC tablet Take 1 tablet (325 mg total) by mouth daily. 30 tablet 11  . glucose blood (TRUE METRIX BLOOD GLUCOSE TEST) test strip Check blood sugar four times daily. 400 each 12  . Insulin Glargine (LANTUS) 100 UNIT/ML Solostar Pen Inject 50 units into the skin every night. 15 mL 11  . Insulin Pen Needle 31G X 6 MM MISC To use w/ Lantus 100 each 12  . levothyroxine (SYNTHROID, LEVOTHROID) 88 MCG tablet Take 1 tablet (88 mcg total) by mouth daily before breakfast. 90 tablet 2  . metoprolol tartrate (LOPRESSOR) 25 MG tablet Take 1 tablet (25 mg total) by mouth 2 (two) times daily. 180 tablet 0  . nitroGLYCERIN (NITROSTAT) 0.4 MG SL tablet Place 0.4 mg under the tongue every 5 (five) minutes as needed. Reported on 09/27/2015    . omeprazole (PRILOSEC) 40  MG capsule Take 1 capsule (40 mg total) by mouth daily. 90 capsule 3  . pravastatin (PRAVACHOL) 40 MG tablet Take 2 tablets (80 mg total) by mouth daily. 180 tablet 2  . sitaGLIPtin (JANUVIA) 100 MG tablet Take 1 tablet (100 mg total) by mouth daily. 90 tablet 0  . vitamin B-12 (CYANOCOBALAMIN) 1000 MCG tablet Take 1 tablet (1,000 mcg total) by mouth daily. 90 tablet 2  . warfarin (COUMADIN) 3 MG tablet TAKE AS DIRECTED BY  COUMADIN  CLINIC 90 tablet 0  . digoxin (LANOXIN) 0.125 MG tablet Take 0.5 tablets (0.0625 mg total) by mouth daily. 45 tablet 3   No current facility-administered medications for this visit.      Past Medical History:  Diagnosis Date  . AAA (abdominal aortic aneurysm) ([place])   . Anemia due to chronic blood loss 11/25/2008   Recurrent over the years EGD and colonoscopy x 2 each 2004 and 2010 without cause    . Atrial fibrillation ([place])   . BPH (benign prostatic hyperplasia)    reports aprocedure (TURP) remotely in [place].Marland KitchenNo futher f/u w/ urology  . CAD (coronary artery disease)   . Diabetes mellitus, type 2 ([place])   . Diverticulosis    left colon  . Erosive gastritis   . GERD (gastroesophageal reflux disease)   . Hyperlipidemia   . Hypertension   . Hypothyroidism   . Insomnia  transient  . Osteopenia    dexa 2-11  . Vitamin B12 deficiency     ROS:   All systems reviewed and negative except as noted in the HPI.   Past Surgical History:  Procedure Laterality Date  . CARDIAC CATHETERIZATION  01/31/06, 09/25/10, 09-2011  . CATARACT EXTRACTION     right  . CHOLECYSTECTOMY, LAPAROSCOPIC    . COLONOSCOPY     multiple  . CORONARY ARTERY BYPASS GRAFT     1999 stents in 2000  . ESOPHAGOGASTRODUODENOSCOPY     multiple  . GIVENS CAPSULE STUDY N/A 11/04/2012   Procedure: GIVENS CAPSULE STUDY;  Surgeon: Gatha Mayer, MD;  Location: [facility] END[facility]COPY;  Service: Endoscopy;  Laterality: N/A;  . inguinal herniorrhaphies     bilateral  . LEFT HEART CATHETERIZATION WITH  CORONARY ANGIOGRAM N/A 10/04/2011   Procedure: LEFT HEART CATHETERIZATION WITH CORONARY ANGIOGRAM;  Surgeon: Burnell Blanks, MD;  Location: [facility] CATH LAB;  Service: Cardiovascular;  Laterality: N/A;  . pacemaker  [place], 2013   medtronic minix [id number]  . PENILE PROSTHESIS IMPLANT  1992  . PERMANENT PACEMAKER GENERATOR CHANGE N/A 06/05/2011   Procedure: PERMANENT PACEMAKER GENERATOR CHANGE;  Surgeon: Evans Lance, MD;  Location: [facility] CATH LAB;  Service: Cardiovascular;  Laterality: N/A;  . PROSTATECTOMY     transurethral  . right hip replacement    . TRANSTHORACIC ECHOCARDIOGRAM  12/2006     Family History  Problem Relation Age of Onset  . Alzheimer's disease Mother   . Heart failure Father   . CVA Father   . Coronary artery disease Brother     cabg  . Alzheimer's disease Sister   . Mental illness Sister   . Alzheimer's disease Sister   . Other Sister     lung problems  . Diabetes Other     several siblings  . Prostate cancer Neg Hx   . Colon cancer Neg Hx   . Esophageal cancer Neg Hx   . Stomach cancer Neg Hx   . Rectal cancer Neg Hx      Social History   Social History  . Marital status: Legally Separated    Spouse name: N/A  . Number of children: 5  . Years of education: N/A   Occupational History  . retired Retired   Social History Main Topics  . Smoking status: Never Smoker  . Smokeless tobacco: Never Used  . Alcohol use 0.6 - 1.2 oz/week    1 - 2 Glasses of wine per week     Comment: socially   . Drug use: No  . Sexual activity: No   Other Topics Concern  . Not on file   Social History Narrative   Lives by self, independent on ADL, retired. Separated from wife.    Daughters:   [name] ( lives in [place]) [phone]   [name] ( lives out of town)  [phone]           BP 130/74   Pulse 71   Ht '5\' 7"'  (1.702 m)   Wt 171 lb (77.6 kg)   SpO2 96%   BMI 26.78 kg/m   Physical Exam:  Well appearing elderly man,NAD HEENT:  Unremarkable Neck:  6 cm JVD, no thyromegally Lungs:  Clear with no wheezes, rales, or rhonchi. Well-healed pacemaker incision HEART:  Regular rate rhythm, grade 2/6 systolic murmur best heard at the right upper sternal border, no rubs, no clicks Abd:  soft, positive bowel sounds, no organomegally,  no rebound, no guarding Ext:  2 plus pulses, no edema, no cyanosis, no clubbing Skin:  No rashes no nodules Neuro:  CN II through XII intact, motor grossly intact  DEVICE  Normal device function.  See PaceArt for details.   Assess/Plan: 1. Atrial fib - his rate is well controlled. He is mostly RV paced 2. PPM - his medtronic VVI PM is working normally. 3. HTN - his blood pressure is well controlled. He will continue his current meds.  Mikle Bosworth.D.

## 2016-06-18 ENCOUNTER — Encounter: Payer: Self-pay | Admitting: Internal Medicine

## 2016-06-18 ENCOUNTER — Ambulatory Visit (INDEPENDENT_AMBULATORY_CARE_PROVIDER_SITE_OTHER): Payer: Medicare HMO | Admitting: Internal Medicine

## 2016-06-18 VITALS — BP 124/74 | HR 83 | Temp 97.6°F | Resp 14 | Ht 67.0 in | Wt 171.0 lb

## 2016-06-18 DIAGNOSIS — E039 Hypothyroidism, unspecified: Secondary | ICD-10-CM | POA: Diagnosis not present

## 2016-06-18 DIAGNOSIS — I10 Essential (primary) hypertension: Secondary | ICD-10-CM | POA: Diagnosis not present

## 2016-06-18 DIAGNOSIS — E118 Type 2 diabetes mellitus with unspecified complications: Secondary | ICD-10-CM

## 2016-06-18 DIAGNOSIS — I4891 Unspecified atrial fibrillation: Secondary | ICD-10-CM

## 2016-06-18 DIAGNOSIS — E785 Hyperlipidemia, unspecified: Secondary | ICD-10-CM | POA: Diagnosis not present

## 2016-06-18 NOTE — Patient Instructions (Addendum)
GO TO THE LAB : Get the blood work     GO TO THE FRONT DESK Schedule your next appointment for a physical exam-Medicare wellness in 3 months    Your next Coumadin check is 07/04/2016  Diabetes: Check your blood sugar  Twice  a day    Check your blood sugar  at different times of the day  GOALS: Fasting before a meal 70- 130 2 hours after a meal less than 180 At bedtime 90-150 Call if consistently not at goal

## 2016-06-18 NOTE — Progress Notes (Signed)
Pre visit review using our clinic review tool, if applicable. No additional management support is needed unless otherwise documented below in the visit note. 

## 2016-06-18 NOTE — Progress Notes (Signed)
Subjective:    Patient ID: [name], male    DOB: 20-Jul-1925, 83 y.o.   MRN: [id number]  DOS:  06/18/2016 Type of visit - description : Routine visit Interval history: DM: Good compliance w/ medication, no ambulatory CBGs HTN: Good med compliance, no ambulatory BPs B12 deficiency: Good compliance with oral supplements High cholesterol good med compliance. In need of  multiple dental extractions. Needs clearance  BP Readings from Last 3 Encounters:  06/18/16 124/74  06/17/16 130/74  12/28/15 128/72     Review of Systems Denies chest pain or difficulty breathing No nausea, vomiting, diarrhea or blood in the stools No dysuria, difficulty urinating or gross hematuria  Past Medical History:  Diagnosis Date  . AAA (abdominal aortic aneurysm) ([facility])   . Anemia due to chronic blood loss 11/25/2008   Recurrent over the years EGD and colonoscopy x 2 each 2004 and 2010 without cause    . Atrial fibrillation ([facility])   . BPH (benign prostatic hyperplasia)    reports aprocedure (TURP) remotely in [place].Marland KitchenNo futher f/u w/ urology  . CAD (coronary artery disease)   . Diabetes mellitus, type 2 ([facility])   . Diverticulosis    left colon  . Erosive gastritis   . GERD (gastroesophageal reflux disease)   . Hyperlipidemia   . Hypertension   . Hypothyroidism   . Insomnia    transient  . Osteopenia    dexa 2-11  . Vitamin B12 deficiency     Past Surgical History:  Procedure Laterality Date  . CARDIAC CATHETERIZATION  01/31/06, 09/25/10, 09-2011  . CATARACT EXTRACTION     right  . CHOLECYSTECTOMY, LAPAROSCOPIC    . COLONOSCOPY     multiple  . CORONARY ARTERY BYPASS GRAFT     1999 stents in 2000  . ESOPHAGOGASTRODUODENOSCOPY     multiple  . GIVENS CAPSULE STUDY N/A 11/04/2012   Procedure: GIVENS CAPSULE STUDY;  Surgeon: Iva Boop, MD;  Location: [facility];  Service: Endoscopy;  Laterality: N/A;  . inguinal herniorrhaphies     bilateral  . LEFT HEART CATHETERIZATION WITH CORONARY  ANGIOGRAM N/A 10/04/2011   Procedure: LEFT HEART CATHETERIZATION WITH CORONARY ANGIOGRAM;  Surgeon: Kathleene Hazel, MD;  [facility] C[facility] LAB;  Service: Cardiovascular;  Laterality: N/A;  . pacemaker  1980, 1995, 2013   medtronic minix [id number]  . PENILE PROSTHESIS IMPLANT  1992  . PERMANENT PACEMAKER GENERATOR CHANGE N/A 06/05/2011   Procedure: PERMANENT PACEMAKER GENERATOR CHANGE;  Surgeon: Marinus Maw, MD;  Location: [facility] CATH LAB;  Service: Cardiovascular;  Laterality: N/A;  . PROSTATECTOMY     transurethral  . right hip replacement    . TRANSTHORACIC ECHOCARDIOGRAM  12/2006    Social History   Social History  . Marital status: Legally Separated    Spouse name: N/A  . Number of children: 5  . Years of education: N/A   Occupational History  . retired Retired   Social History Main Topics  . Smoking status: Never Smoker  . Smokeless tobacco: Never Used  . Alcohol use 0.6 - 1.2 oz/week    1 - 2 Glasses of wine per week     Comment: socially   . Drug use: No  . Sexual activity: No   Other Topics Concern  . Not on file   Social History Narrative   Lives by self, independent on ADL, retired. Separated from wife.    Daughters:   [name] ( lives in [place]) [phone]   Sherri (  lives out of town)  [phone]            Allergies as of 06/18/2016      Reactions   Ace Inhibitors Other (See Comments)   REACTION: cough   Codeine Phosphate Nausea Only   REACTION: nausea   Hydrochlorothiazide W-triamterene Other (See Comments)   REACTION: cramps      Medication List       Accurate as of 06/18/16 11:59 PM. Always use your most recent med list.          aspirin 81 MG tablet Take 81 mg by mouth daily.   digoxin 0.125 MG tablet Commonly known as:  LANOXIN Take 0.5 tablets (0.0625 mg total) by mouth daily.   EASY TOUCH INSULIN SYRINGE 31G X 5/16" 1 ML Misc Generic drug:  Insulin Syringe-Needle U-100 USE AS DIRECTED WITH NPH INSULIN   fenofibrate micronized  134 MG capsule Commonly known as:  LOFIBRA Take 1 capsule (134 mg total) by mouth daily.   glucose blood test strip Commonly known as:  TRUE METRIX BLOOD GLUCOSE TEST Check blood sugar four times daily.   Insulin Glargine 100 UNIT/ML Solostar Pen Commonly known as:  LANTUS Inject 50 units into the skin every night.   Insulin Pen Needle 31G X 6 MM Misc To use w/ Lantus   levothyroxine 88 MCG tablet Commonly known as:  SYNTHROID, LEVOTHROID Take 1 tablet (88 mcg total) by mouth daily before breakfast.   metoprolol tartrate 25 MG tablet Commonly known as:  LOPRESSOR Take 1 tablet (25 mg total) by mouth 2 (two) times daily.   nitroGLYCERIN 0.4 MG SL tablet Commonly known as:  NITROSTAT Place 0.4 mg under the tongue every 5 (five) minutes as needed. Reported on 09/27/2015   omeprazole 40 MG capsule Commonly known as:  PRILOSEC Take 1 capsule (40 mg total) by mouth daily.   pravastatin 40 MG tablet Commonly known as:  PRAVACHOL Take 2 tablets (80 mg total) by mouth daily.   sitaGLIPtin 100 MG tablet Commonly known as:  JANUVIA Take 1 tablet (100 mg total) by mouth daily.   TRUE METRIX METER w/Device Kit Check blood sugar four times daily.   vitamin B-12 1000 MCG tablet Commonly known as:  CYANOCOBALAMIN Take 1 tablet (1,000 mcg total) by mouth daily.   warfarin 3 MG tablet Commonly known as:  COUMADIN TAKE AS DIRECTED BY  COUMADIN  CLINIC          Objective:   Physical Exam BP 124/74 (BP Location: Left Arm, Patient Position: Sitting, Cuff Size: Normal)   Pulse 83   Temp 97.6 F (36.4 C) (Oral)   Resp 14   Ht _0  (1.702 m)   Wt 171 lb (77.6 kg)   SpO2 98%   BMI 26.78 kg/m  General:   Well developed, well nourished . NAD.  Neck: No thyromegaly HEENT:  Normocephalic . Face symmetric, atraumatic Lungs:  CTA B Normal respiratory effort, no intercostal retractions, no accessory muscle use. Heart: RRR, soft systolic murmur .No pretibial edema bilaterally    Skin: Not pale. Not jaundice Neurologic:  alert & oriented X3.  Speech normal, gait appropriate for age and unassisted Psych--  Cognition and judgment appear intact.  Cooperative with normal attention span and concentration.  Behavior appropriate. No anxious or depressed appearing.      Assessment & Plan:   Assessment DM, + neuropahty per exam 12-2015 CRI Creat ~ 1.3-1.4 HTN Hyperlipidemia Hypothyroidism CV: --CAD -- Dr Harrington Challenger --Atrial  fibrillation, PPM --- Dr Lovena Le --AAA: [place] 06-2014 stable, was rec no further imagine  GI: Erosive gastritis, GERD, h/o anemia d/t  chronic blood loss BPH Osteopenia- declined dexa 2015 B 12 deficiency  Plan: DM, neuropathy: On Lantus and Januvia, no ambulatory CBGs. Check A1c.Encouraged to check CBGs, states that he has a hard time checking his sugars,his glucometer is very complicated, feels frustrated; I offered him a nurse visit , a new glucometer and training but he declined. He is 54, lives by himself we are not looking at a tight glucose control, goal is prevention of hyperglycemia and hypoglycemia HTN: On metoprolol, check a BMP and CBC. BP is check here regularly when he comes for INRs. Hyperlipidemia: Last FLP satisfactory, triglycerides just a slightly elevated. Recheck on RTC Hypothyroidism: On Synthroid, check a TSH  CAD: Asx, on aspirin, beta blockers, statins.Has not seen his regular cardiologist in a while but is stable Atrial fibrillation: Seems stable, recently seen by Dr Lovena Le,  doing okay Clearance: Needs dental extraction, I think is okay to hold Coumadin x 5-6 days priot to surgery, will consult with cardiology via message. Last INR satisfactory, next due 07-04-16 Next visit 3-4 months, CPX and Medicare wellness

## 2016-06-19 LAB — BASIC METABOLIC PANEL
BUN: 19 mg/dL (ref 6–23)
CALCIUM: 8.7 mg/dL (ref 8.4–10.5)
CHLORIDE: 105 meq/L (ref 96–112)
CO2: 23 mEq/L (ref 19–32)
CREATININE: 1.25 mg/dL (ref 0.40–1.50)
GFR: 57.9 mL/min — ABNORMAL LOW (ref >60.00)
Glucose, Bld: 278 mg/dL — ABNORMAL HIGH (ref 70–99)
Potassium: 4.5 mEq/L (ref 3.5–5.1)
SODIUM: 134 meq/L — AB (ref 135–145)

## 2016-06-19 LAB — CBC WITH DIFFERENTIAL/PLATELET
BASOS ABS: 0.1 10*3/uL (ref 0.0–0.1)
BASOS PCT: 1 % (ref 0.0–3.0)
EOS ABS: 0.1 10*3/uL (ref 0.0–0.7)
Eosinophils Relative: 2 % (ref 0.0–5.0)
HEMATOCRIT: 43.8 % (ref 39.0–52.0)
HEMOGLOBIN: 14.5 g/dL (ref 13.0–17.0)
LYMPHS PCT: 18 % (ref 12.0–46.0)
Lymphs Abs: 1.1 10*3/uL (ref 0.7–4.0)
MCHC: 33.1 g/dL (ref 30.0–36.0)
MCV: 96 fl (ref 78.0–100.0)
Monocytes Absolute: 0.6 10*3/uL (ref 0.1–1.0)
Monocytes Relative: 9 % (ref 3.0–12.0)
Neutro Abs: 4.4 10*3/uL (ref 1.4–7.7)
Neutrophils Relative %: 70 % (ref 43.0–77.0)
Platelets: 185 10*3/uL (ref 150.0–400.0)
RBC: 4.56 Mil/uL (ref 4.22–5.81)
RDW: 14.8 % (ref 11.5–15.5)
WBC: 6.3 10*3/uL (ref 4.0–10.5)

## 2016-06-19 LAB — TSH: TSH: 0.73 u[IU]/mL (ref 0.35–4.50)

## 2016-06-19 LAB — HEMOGLOBIN A1C: Hgb A1c MFr Bld: 9.2 % — ABNORMAL HIGH (ref 4.6–6.5)

## 2016-06-19 NOTE — Assessment & Plan Note (Signed)
DM, neuropathy: On Lantus and Januvia, no ambulatory CBGs. Check A1c.Encouraged to check CBGs, states that he has a hard time checking his sugars,his glucometer is very complicated, feels frustrated; I offered him a nurse visit , a new glucometer and training but he declined. He is 63, lives by himself we are not looking at a tight glucose control, goal is prevention of hyperglycemia and hypoglycemia HTN: On metoprolol, check a BMP and CBC. BP is check here regularly when he comes for INRs. Hyperlipidemia: Last FLP satisfactory, triglycerides just a slightly elevated. Recheck on RTC Hypothyroidism: On Synthroid, check a TSH  CAD: Asx, on aspirin, beta blockers, statins.Has not seen his regular cardiologist in a while but is stable Atrial fibrillation: Seems stable, recently seen by Dr Lovena Le,  doing okay Clearance: Needs dental extraction, I think is okay to hold Coumadin x 5-6 days priot to surgery, will consult with cardiology via message. Last INR satisfactory, next due 07-04-16 Next visit 3-4 months, CPX and Medicare wellness

## 2016-06-20 ENCOUNTER — Telehealth: Payer: Self-pay | Admitting: Internal Medicine

## 2016-06-20 NOTE — Telephone Encounter (Signed)
Called, spoke with Lorriane Shire at Dr. Aileen Pilot office. Informed pt is followed by Dr.Paz for Coumadin. [name] will have to give clearance for Coumadin. . Informed I will forward clearance docs to Dr. Lovena Le for procedure clearance. Lorriane Shire verbalized understanding.

## 2016-06-20 NOTE — Telephone Encounter (Signed)
New message   Fax request sent over 1.15   1. What dental office are you calling from? Dr. Otilio Carpen  2. What is your office phone and fax number? [phone]/fax [phone]   3. What type of procedure is the patient having performed? Oral surgery   4. What date is procedure scheduled? Pending   5. What is your question (ex. Antibiotics prior to procedure, holding medication-we need to know how long dentist wants pt to hold med)? Warfarin

## 2016-06-24 ENCOUNTER — Other Ambulatory Visit: Payer: Self-pay

## 2016-06-24 MED ORDER — METOPROLOL TARTRATE 25 MG PO TABS: 25.0000 mg | ORAL_TABLET | Freq: Two times a day (BID) | ORAL | 2 refills | Status: DC

## 2016-06-25 ENCOUNTER — Telehealth: Payer: Self-pay | Admitting: Internal Medicine

## 2016-06-25 NOTE — Telephone Encounter (Signed)
Advise patient: [name] to hold Coumadin 5 to 6 days days prior to surgery Ideally, I would like to check an INR the day before the surgery, if possible ask the patient to schedule. Go back on Coumadin as soon as his surgeon okay it . INR check 2 weeks after he is back on Coumadin. Continue aspirin (d/w cards)

## 2016-06-25 NOTE — Telephone Encounter (Signed)
Spoke w/ Pt, informed of recommendations-he currently does not have surgery scheduled, has been waiting for surgical clearance form. Informed Pt that form has been faxed over today. Pt will call and schedule nurse visit for day before surgery once scheduled.

## 2016-06-25 NOTE — Telephone Encounter (Addendum)
Form completed and faxed to [place] at [phone]. Form sent for scanning. Received fax confirmation on 06/25/2016 1435.

## 2016-06-27 NOTE — Telephone Encounter (Signed)
Sent clearance documents to medical records to be faxed to Dr. Aileen Pilot office [phone]. Dr. Lovena Le stated "Ok to hold Warfarin".

## 2016-07-03 ENCOUNTER — Other Ambulatory Visit: Payer: Self-pay | Admitting: Internal Medicine

## 2016-07-04 ENCOUNTER — Ambulatory Visit: Payer: Medicare HMO

## 2016-07-17 ENCOUNTER — Other Ambulatory Visit: Payer: Self-pay | Admitting: Internal Medicine

## 2016-07-19 ENCOUNTER — Other Ambulatory Visit: Payer: Self-pay | Admitting: Internal Medicine

## 2016-08-08 ENCOUNTER — Inpatient Hospital Stay (HOSPITAL_COMMUNITY)
Admission: EM | Admit: 2016-08-08 | Discharge: 2016-08-13 | DRG: 312 | Disposition: A | Discharge status: Skilled Nursing Facility | Payer: Medicare HMO | Attending: Internal Medicine | Admitting: Internal Medicine

## 2016-08-08 ENCOUNTER — Emergency Department (HOSPITAL_COMMUNITY): Payer: Medicare HMO

## 2016-08-08 ENCOUNTER — Encounter (HOSPITAL_COMMUNITY): Payer: Self-pay | Admitting: *Deleted

## 2016-08-08 DIAGNOSIS — I4821 Permanent atrial fibrillation: Secondary | ICD-10-CM | POA: Diagnosis present

## 2016-08-08 DIAGNOSIS — M542 Cervicalgia: Secondary | ICD-10-CM | POA: Diagnosis not present

## 2016-08-08 DIAGNOSIS — I25810 Atherosclerosis of coronary artery bypass graft(s) without angina pectoris: Secondary | ICD-10-CM | POA: Diagnosis not present

## 2016-08-08 DIAGNOSIS — K219 Gastro-esophageal reflux disease without esophagitis: Secondary | ICD-10-CM | POA: Diagnosis present

## 2016-08-08 DIAGNOSIS — R413 Other amnesia: Secondary | ICD-10-CM | POA: Diagnosis present

## 2016-08-08 DIAGNOSIS — I482 Chronic atrial fibrillation: Secondary | ICD-10-CM | POA: Diagnosis not present

## 2016-08-08 DIAGNOSIS — S022XXA Fracture of nasal bones, initial encounter for closed fracture: Secondary | ICD-10-CM | POA: Diagnosis not present

## 2016-08-08 DIAGNOSIS — I2510 Atherosclerotic heart disease of native coronary artery without angina pectoris: Secondary | ICD-10-CM | POA: Diagnosis present

## 2016-08-08 DIAGNOSIS — M25511 Pain in right shoulder: Secondary | ICD-10-CM | POA: Diagnosis not present

## 2016-08-08 DIAGNOSIS — Z955 Presence of coronary angioplasty implant and graft: Secondary | ICD-10-CM

## 2016-08-08 DIAGNOSIS — M75121 Complete rotator cuff tear or rupture of right shoulder, not specified as traumatic: Secondary | ICD-10-CM | POA: Diagnosis present

## 2016-08-08 DIAGNOSIS — Z96641 Presence of right artificial hip joint: Secondary | ICD-10-CM | POA: Diagnosis present

## 2016-08-08 DIAGNOSIS — R402142 Coma scale, eyes open, spontaneous, at arrival to emergency department: Secondary | ICD-10-CM | POA: Diagnosis present

## 2016-08-08 DIAGNOSIS — E1165 Type 2 diabetes mellitus with hyperglycemia: Secondary | ICD-10-CM | POA: Diagnosis present

## 2016-08-08 DIAGNOSIS — S199XXA Unspecified injury of neck, initial encounter: Secondary | ICD-10-CM | POA: Diagnosis not present

## 2016-08-08 DIAGNOSIS — R402242 Coma scale, best verbal response, confused conversation, at arrival to emergency department: Secondary | ICD-10-CM | POA: Diagnosis present

## 2016-08-08 DIAGNOSIS — R55 Syncope and collapse: Principal | ICD-10-CM | POA: Diagnosis present

## 2016-08-08 DIAGNOSIS — Z8249 Family history of ischemic heart disease and other diseases of the circulatory system: Secondary | ICD-10-CM

## 2016-08-08 DIAGNOSIS — I10 Essential (primary) hypertension: Secondary | ICD-10-CM | POA: Diagnosis present

## 2016-08-08 DIAGNOSIS — S0003XA Contusion of scalp, initial encounter: Secondary | ICD-10-CM | POA: Diagnosis not present

## 2016-08-08 DIAGNOSIS — M79601 Pain in right arm: Secondary | ICD-10-CM | POA: Diagnosis not present

## 2016-08-08 DIAGNOSIS — E785 Hyperlipidemia, unspecified: Secondary | ICD-10-CM | POA: Diagnosis not present

## 2016-08-08 DIAGNOSIS — I4891 Unspecified atrial fibrillation: Secondary | ICD-10-CM | POA: Diagnosis present

## 2016-08-08 DIAGNOSIS — E039 Hypothyroidism, unspecified: Secondary | ICD-10-CM | POA: Diagnosis present

## 2016-08-08 DIAGNOSIS — S066XAA Traumatic subarachnoid hemorrhage with loss of consciousness status unknown, initial encounter: Secondary | ICD-10-CM

## 2016-08-08 DIAGNOSIS — Z951 Presence of aortocoronary bypass graft: Secondary | ICD-10-CM

## 2016-08-08 DIAGNOSIS — S4991XA Unspecified injury of right shoulder and upper arm, initial encounter: Secondary | ICD-10-CM | POA: Diagnosis not present

## 2016-08-08 DIAGNOSIS — S46011A Strain of muscle(s) and tendon(s) of the rotator cuff of right shoulder, initial encounter: Secondary | ICD-10-CM | POA: Diagnosis present

## 2016-08-08 DIAGNOSIS — S0990XA Unspecified injury of head, initial encounter: Secondary | ICD-10-CM | POA: Diagnosis not present

## 2016-08-08 DIAGNOSIS — S066X9A Traumatic subarachnoid hemorrhage with loss of consciousness of unspecified duration, initial encounter: Secondary | ICD-10-CM | POA: Diagnosis not present

## 2016-08-08 DIAGNOSIS — S0181XA Laceration without foreign body of other part of head, initial encounter: Secondary | ICD-10-CM | POA: Diagnosis not present

## 2016-08-08 DIAGNOSIS — Z7901 Long term (current) use of anticoagulants: Secondary | ICD-10-CM

## 2016-08-08 DIAGNOSIS — W19XXXA Unspecified fall, initial encounter: Secondary | ICD-10-CM | POA: Diagnosis not present

## 2016-08-08 DIAGNOSIS — R9082 White matter disease, unspecified: Secondary | ICD-10-CM | POA: Diagnosis present

## 2016-08-08 DIAGNOSIS — E538 Deficiency of other specified B group vitamins: Secondary | ICD-10-CM | POA: Diagnosis present

## 2016-08-08 DIAGNOSIS — M75101 Unspecified rotator cuff tear or rupture of right shoulder, not specified as traumatic: Secondary | ICD-10-CM | POA: Diagnosis not present

## 2016-08-08 DIAGNOSIS — Z820 Family history of epilepsy and other diseases of the nervous system: Secondary | ICD-10-CM

## 2016-08-08 DIAGNOSIS — M75111 Incomplete rotator cuff tear or rupture of right shoulder, not specified as traumatic: Secondary | ICD-10-CM | POA: Diagnosis not present

## 2016-08-08 DIAGNOSIS — M6281 Muscle weakness (generalized): Secondary | ICD-10-CM | POA: Diagnosis not present

## 2016-08-08 DIAGNOSIS — R51 Headache: Secondary | ICD-10-CM | POA: Diagnosis not present

## 2016-08-08 DIAGNOSIS — E118 Type 2 diabetes mellitus with unspecified complications: Secondary | ICD-10-CM

## 2016-08-08 DIAGNOSIS — S022XXD Fracture of nasal bones, subsequent encounter for fracture with routine healing: Secondary | ICD-10-CM | POA: Diagnosis not present

## 2016-08-08 DIAGNOSIS — S0081XA Abrasion of other part of head, initial encounter: Secondary | ICD-10-CM | POA: Diagnosis present

## 2016-08-08 DIAGNOSIS — S4991XD Unspecified injury of right shoulder and upper arm, subsequent encounter: Secondary | ICD-10-CM | POA: Diagnosis not present

## 2016-08-08 DIAGNOSIS — M5030 Other cervical disc degeneration, unspecified cervical region: Secondary | ICD-10-CM | POA: Diagnosis present

## 2016-08-08 DIAGNOSIS — S0993XA Unspecified injury of face, initial encounter: Secondary | ICD-10-CM | POA: Diagnosis not present

## 2016-08-08 DIAGNOSIS — R402362 Coma scale, best motor response, obeys commands, at arrival to emergency department: Secondary | ICD-10-CM | POA: Diagnosis present

## 2016-08-08 DIAGNOSIS — R220 Localized swelling, mass and lump, head: Secondary | ICD-10-CM | POA: Diagnosis not present

## 2016-08-08 DIAGNOSIS — Z950 Presence of cardiac pacemaker: Secondary | ICD-10-CM | POA: Diagnosis not present

## 2016-08-08 DIAGNOSIS — I609 Nontraumatic subarachnoid hemorrhage, unspecified: Secondary | ICD-10-CM

## 2016-08-08 DIAGNOSIS — Z9181 History of falling: Secondary | ICD-10-CM

## 2016-08-08 DIAGNOSIS — R52 Pain, unspecified: Secondary | ICD-10-CM

## 2016-08-08 DIAGNOSIS — Z794 Long term (current) use of insulin: Secondary | ICD-10-CM

## 2016-08-08 DIAGNOSIS — S066X0A Traumatic subarachnoid hemorrhage without loss of consciousness, initial encounter: Secondary | ICD-10-CM | POA: Diagnosis not present

## 2016-08-08 DIAGNOSIS — R404 Transient alteration of awareness: Secondary | ICD-10-CM | POA: Diagnosis not present

## 2016-08-08 DIAGNOSIS — Z7982 Long term (current) use of aspirin: Secondary | ICD-10-CM

## 2016-08-08 DIAGNOSIS — R262 Difficulty in walking, not elsewhere classified: Secondary | ICD-10-CM | POA: Diagnosis not present

## 2016-08-08 DIAGNOSIS — S6992XA Unspecified injury of left wrist, hand and finger(s), initial encounter: Secondary | ICD-10-CM | POA: Diagnosis not present

## 2016-08-08 DIAGNOSIS — R2681 Unsteadiness on feet: Secondary | ICD-10-CM | POA: Diagnosis not present

## 2016-08-08 HISTORY — DX: Unspecified fall, initial encounter: W19.XXXA

## 2016-08-08 LAB — GLUCOSE, CAPILLARY
GLUCOSE-CAPILLARY: 268 mg/dL — AB (ref 65–99)
GLUCOSE-CAPILLARY: 276 mg/dL — AB (ref 65–99)

## 2016-08-08 LAB — BASIC METABOLIC PANEL
Anion gap: 9 (ref 5–15)
BUN: 18 mg/dL (ref 4–21)
BUN: 18 mg/dL (ref 6–20)
CO2: 24 mmol/L (ref 22–32)
CREATININE: 1.2 mg/dL (ref 0.6–1.3)
Calcium: 8.5 mg/dL — ABNORMAL LOW (ref 8.9–10.3)
Chloride: 105 mmol/L (ref 101–111)
Creatinine, Ser: 1.21 mg/dL (ref 0.61–1.24)
GFR calc Af Amer: 60 mL/min — ABNORMAL LOW (ref >60)
GFR calc non Af Amer: 52 mL/min — ABNORMAL LOW (ref >60)
GLUCOSE: 256 mg/dL
Glucose, Bld: 256 mg/dL — ABNORMAL HIGH (ref 65–99)
Potassium: 4.6 mmol/L (ref 3.4–5.3)
Potassium: 4.6 mmol/L (ref 3.5–5.1)
SODIUM: 138 mmol/L (ref 137–147)
Sodium: 138 mmol/L (ref 135–145)

## 2016-08-08 LAB — URINALYSIS, ROUTINE W REFLEX MICROSCOPIC
Bacteria, UA: NONE SEEN
Bilirubin Urine: NEGATIVE
Glucose, UA: >=500 mg/dL — AB
Hgb urine dipstick: NEGATIVE
Ketones, ur: 5 mg/dL — AB
Leukocytes, UA: NEGATIVE
Nitrite: NEGATIVE
Protein, ur: NEGATIVE mg/dL
Specific Gravity, Urine: 1.021 (ref 1.005–1.030)
pH: 6 (ref 5.0–8.0)

## 2016-08-08 LAB — CBC WITH DIFFERENTIAL/PLATELET
Basophils Absolute: 0 10*3/uL (ref 0.0–0.1)
Basophils Relative: 0 %
Eosinophils Absolute: 0.1 10*3/uL (ref 0.0–0.7)
Eosinophils Relative: 1 %
HCT: 41.2 % (ref 39.0–52.0)
Hemoglobin: 13.8 g/dL (ref 13.0–17.0)
Lymphocytes Relative: 7 %
Lymphs Abs: 0.7 10*3/uL (ref 0.7–4.0)
MCH: 31.9 pg (ref 26.0–34.0)
MCHC: 33.5 g/dL (ref 30.0–36.0)
MCV: 95.2 fL (ref 78.0–100.0)
Monocytes Absolute: 0.6 10*3/uL (ref 0.1–1.0)
Monocytes Relative: 6 %
Neutro Abs: 9.3 10*3/uL — ABNORMAL HIGH (ref 1.7–7.7)
Neutrophils Relative %: 86 %
Platelets: 150 10*3/uL (ref 150–400)
RBC: 4.33 MIL/uL (ref 4.22–5.81)
RDW: 13.9 % (ref 11.5–15.5)
WBC: 10.8 10*3/uL — ABNORMAL HIGH (ref 4.0–10.5)

## 2016-08-08 LAB — TROPONIN I

## 2016-08-08 LAB — CBC AND DIFFERENTIAL
HEMATOCRIT: 41 % (ref 41–53)
HEMOGLOBIN: 13.8 g/dL (ref 13.5–17.5)
PLATELETS: 150 10*3/uL (ref 150–399)
WBC: 10.8 10*3/mL

## 2016-08-08 LAB — PROTIME-INR
INR: 2.43
Prothrombin Time: 26.9 seconds — ABNORMAL HIGH (ref 11.4–15.2)

## 2016-08-08 MED ORDER — SODIUM CHLORIDE 0.9% FLUSH
3.0000 mL | Freq: Two times a day (BID) | INTRAVENOUS | Status: DC
  Administered 2016-08-08 – 2016-08-13 (×8): 3 mL via INTRAVENOUS

## 2016-08-08 MED ORDER — LATANOPROST 0.005 % OP SOLN
1.0000 [drp] | Freq: Every day | OPHTHALMIC | Status: DC
  Administered 2016-08-08 – 2016-08-12 (×5): 1 [drp] via OPHTHALMIC
  Filled 2016-08-08: qty 2.5

## 2016-08-08 MED ORDER — LEVOTHYROXINE SODIUM 88 MCG PO TABS
88.0000 ug | ORAL_TABLET | Freq: Every day | ORAL | Status: DC
  Administered 2016-08-09 – 2016-08-13 (×5): 88 ug via ORAL
  Filled 2016-08-08 (×5): qty 1

## 2016-08-08 MED ORDER — LEVETIRACETAM 500 MG PO TABS
500.0000 mg | ORAL_TABLET | Freq: Two times a day (BID) | ORAL | Status: DC
  Administered 2016-08-08 – 2016-08-13 (×10): 500 mg via ORAL
  Filled 2016-08-08 (×11): qty 1

## 2016-08-08 MED ORDER — LIDOCAINE HCL 2 % IJ SOLN
20.0000 mL | Freq: Once | INTRAMUSCULAR | Status: AC
  Administered 2016-08-08: 400 mg
  Filled 2016-08-08: qty 20

## 2016-08-08 MED ORDER — INSULIN ASPART 100 UNIT/ML ~~LOC~~ SOLN: 0.0000 [IU] | Freq: Three times a day (TID) | SUBCUTANEOUS | Status: DC

## 2016-08-08 MED ORDER — DIGOXIN 125 MCG PO TABS
0.0625 mg | ORAL_TABLET | Freq: Every day | ORAL | Status: DC
  Administered 2016-08-08 – 2016-08-12 (×5): 0.0625 mg via ORAL
  Filled 2016-08-08 (×5): qty 1

## 2016-08-08 MED ORDER — PRAVASTATIN SODIUM 40 MG PO TABS
80.0000 mg | ORAL_TABLET | Freq: Every day | ORAL | Status: DC
  Administered 2016-08-08 – 2016-08-12 (×5): 80 mg via ORAL
  Filled 2016-08-08 (×5): qty 2

## 2016-08-08 MED ORDER — NITROGLYCERIN 0.4 MG SL SUBL: 0.4000 mg | SUBLINGUAL_TABLET | SUBLINGUAL | Status: DC | PRN

## 2016-08-08 MED ORDER — SODIUM CHLORIDE 0.9% FLUSH: 3.0000 mL | INTRAVENOUS | Status: DC | PRN

## 2016-08-08 MED ORDER — SODIUM CHLORIDE 0.9% FLUSH
3.0000 mL | Freq: Two times a day (BID) | INTRAVENOUS | Status: DC
  Administered 2016-08-08 – 2016-08-13 (×4): 3 mL via INTRAVENOUS

## 2016-08-08 MED ORDER — INSULIN ASPART 100 UNIT/ML ~~LOC~~ SOLN
0.0000 [IU] | Freq: Every day | SUBCUTANEOUS | Status: DC
  Administered 2016-08-08 – 2016-08-09 (×2): 3 [IU] via SUBCUTANEOUS
  Administered 2016-08-10: 2 [IU] via SUBCUTANEOUS
  Administered 2016-08-12: 3 [IU] via SUBCUTANEOUS

## 2016-08-08 MED ORDER — INSULIN GLARGINE 100 UNIT/ML ~~LOC~~ SOLN
50.0000 [IU] | Freq: Every day | SUBCUTANEOUS | Status: DC
  Administered 2016-08-08: 50 [IU] via SUBCUTANEOUS
  Filled 2016-08-08: qty 0.5

## 2016-08-08 MED ORDER — FENOFIBRATE 160 MG PO TABS
160.0000 mg | ORAL_TABLET | Freq: Every day | ORAL | Status: DC
  Administered 2016-08-09 – 2016-08-13 (×5): 160 mg via ORAL
  Filled 2016-08-08 (×5): qty 1

## 2016-08-08 MED ORDER — SODIUM CHLORIDE 0.9 % IV SOLN: 250.0000 mL | INTRAVENOUS | Status: DC | PRN

## 2016-08-08 MED ORDER — PANTOPRAZOLE SODIUM 40 MG PO TBEC
40.0000 mg | DELAYED_RELEASE_TABLET | Freq: Every day | ORAL | Status: DC
  Administered 2016-08-09 – 2016-08-13 (×5): 40 mg via ORAL
  Filled 2016-08-08 (×5): qty 1

## 2016-08-08 MED ORDER — MORPHINE SULFATE (PF) 2 MG/ML IV SOLN
2.0000 mg | INTRAVENOUS | Status: DC | PRN
  Administered 2016-08-08 – 2016-08-09 (×2): 2 mg via INTRAVENOUS
  Filled 2016-08-08 (×2): qty 1

## 2016-08-08 MED ORDER — METOPROLOL TARTRATE 25 MG PO TABS
25.0000 mg | ORAL_TABLET | Freq: Two times a day (BID) | ORAL | Status: DC
  Administered 2016-08-08 – 2016-08-09 (×2): 25 mg via ORAL
  Filled 2016-08-08 (×2): qty 1

## 2016-08-08 NOTE — ED Notes (Signed)
Pt attempting to provide urine sample

## 2016-08-08 NOTE — ED Provider Notes (Signed)
Medical screening examination/treatment/procedure(s) were conducted as a shared visit with non-physician practitioner(s) and myself.  I personally evaluated the patient during the encounter.   EKG Interpretation None     Patient here after unwitnessed fall today which greater than syncope. He is on Coumadin and struck his head. Has evidence of a small subarachnoid hemorrhage. His neurological exam is stable. Will consultsurgery likely admitted to the hospitalist for evaluation of syncope.   Lacretia Leigh, MD 08/08/16 [phone]

## 2016-08-08 NOTE — H&P (Signed)
History and Physical    [name]:[id number] DOB: 03-08-1926 DOA: 08/08/2016  PCP: Kathlene November, MD  Patient coming from: home  Chief Complaint: fall with trauma to head  HPI: [name] is a 83 y.o. male with medical history significant of atrial fibrillation, CAD status post artery bypass grafting. Presenting to the hospital after a fall. Patient fell and has multiple facial abrasions. He is unaware of why he fell. He does not know if he tripped or passed out. He does state that he has fallen before because of mechanical falls as well as unclear etiology. Patient did not have any bladder or bowel incontinence. No witnessed seizure-like activity.  ED Course: Patient had CT scan of head which reported the following:  Mild chronic ischemic white matter disease. Mild diffuse cortical atrophy. Small left frontal scalp hematoma. Probable small focus of subarachnoid hemorrhage seen in right posterior parietal sulcus.  Moderately displaced and comminuted nasal bone fracture is noted.  Severe multilevel degenerative disc disease is noted in the cervical spine. No fracture or spondylolisthesis is noted.  Given above findings neurosurgery was consulted and we were consulted for further medical evaluation recommendations  Review of Systems: As per HPI otherwise 10 point review of systems negative.   Past Medical History:  Diagnosis Date  . AAA (abdominal aortic aneurysm) ([place])   . Anemia due to chronic blood loss 11/25/2008   Recurrent over the years EGD and colonoscopy x 2 each 2004 and 2010 without cause    . Atrial fibrillation ([place])   . BPH (benign prostatic hyperplasia)    reports aprocedure (TURP) remotely in [place] KitchenNo futher f/u w/ urology  . CAD (coronary artery disease)   . Diabetes mellitus, type 2 ([place])   . Diverticulosis    left colon  . Erosive gastritis   . GERD (gastroesophageal reflux disease)   . Hyperlipidemia   . Hypertension   . Hypothyroidism   . Insomnia    transient  . Osteopenia    dexa 2-11  . Vitamin B12 deficiency     Past Surgical History:  Procedure Laterality Date  . CARDIAC CATHETERIZATION  01/31/06, 09/25/10, 09-2011  . CATARACT EXTRACTION     right  . CHOLECYSTECTOMY, LAPAROSCOPIC    . COLONOSCOPY     multiple  . CORONARY ARTERY BYPASS GRAFT     1999 stents in 2000  . ESOPHAGOGASTRODUODENOSCOPY     multiple  . GIVENS CAPSULE STUDY N/A 11/04/2012   Procedure: GIVENS CAPSULE STUDY;  Surgeon: Gatha Mayer, MD;  Location: [facility] ENDOSCOPY;  Service: Endoscopy;  Laterality: N/A;  . inguinal herniorrhaphies     bilateral  . LEFT HEART CATHETERIZATION WITH CORONARY ANGIOGRAM N/A 10/04/2011   Procedure: LEFT HEART CATHETERIZATION WITH CORONARY ANGIOGRAM;  Surgeon: Burnell Blanks, MD;  Location: [facility] CATH LAB;  Service: Cardiovascular;  Laterality: N/A;  . pacemaker  [place], 2013   medtronic minix [id number]  . PENILE PROSTHESIS IMPLANT  1992  . PERMANENT PACEMAKER GENERATOR CHANGE N/A 06/05/2011   Procedure: PERMANENT PACEMAKER GENERATOR CHANGE;  Surgeon: Evans Lance, MD;  Location: [facility] CATH LAB;  Service: Cardiovascular;  Laterality: N/A;  . PROSTATECTOMY     transurethral  . right hip replacement    . TRANSTHORACIC ECHOCARDIOGRAM  12/2006     reports that he has never smoked. He has never used smokeless tobacco. He reports that he drinks about 0.6 - 1.2 oz of alcohol per week . He reports that he does not use drugs.  Allergies  Allergen Reactions  . Ace Inhibitors Cough  . Codeine Phosphate Nausea Only  . Hydrochlorothiazide W-Triamterene Other (See Comments)    CRAMPING    Family History  Problem Relation Age of Onset  . Alzheimer's disease Mother   . Heart failure Father   . CVA Father   . Coronary artery disease Brother     cabg  . Alzheimer's disease Sister   . Mental illness Sister   . Alzheimer's disease Sister   . Other Sister     lung problems  . Diabetes Other     several siblings  . Prostate cancer  Neg Hx   . Colon cancer Neg Hx   . Esophageal cancer Neg Hx   . Stomach cancer Neg Hx   . Rectal cancer Neg Hx     Prior to Admission medications   Medication Sig Start Date End Date Taking? Authorizing Provider  amoxicillin (AMOXIL) 500 MG capsule Take 2,000 mg by mouth See admin instructions. ONE HOUR PRIOR TO EACH DENTAL APPOINTMENT   Yes Historical Provider, MD  aspirin 81 MG tablet Take 81 mg by mouth daily.   Yes Historical Provider, MD  Blood Glucose Monitoring Suppl (TRUE METRIX METER) w/Device KIT Check blood sugar four times daily. 10/04/15  Yes Colon Branch, MD  digoxin (LANOXIN) 0.125 MG tablet Take 0.5 tablets (0.0625 mg total) by mouth daily. 06/17/16  Yes Evans Lance, MD  EASY TOUCH INSULIN SYRINGE 31G X 5/16" 1 ML MISC USE AS DIRECTED WITH NPH INSULIN 11/16/13  Yes Colon Branch, MD  fenofibrate micronized (LOFIBRA) 134 MG capsule Take 1 capsule (134 mg total) by mouth daily. 07/17/16  Yes Colon Branch, MD  glucose blood (TRUE METRIX BLOOD GLUCOSE TEST) test strip Check blood sugar four times daily. 10/04/15  Yes Colon Branch, MD  Insulin Glargine (LANTUS) 100 UNIT/ML Solostar Pen Inject 50 units into the skin every night. Patient taking differently: Inject 50 Units into the skin daily.  09/29/15  Yes Colon Branch, MD  Insulin Pen Needle 31G X 6 MM MISC To use w/ Lantus 10/02/15  Yes Colon Branch, MD  latanoprost (XALATAN) 0.005 % ophthalmic solution Place 1 drop into both eyes at bedtime. 06/07/16  Yes Historical Provider, MD  levothyroxine (SYNTHROID, LEVOTHROID) 88 MCG tablet Take 1 tablet (88 mcg total) by mouth daily before breakfast. 07/19/16  Yes Colon Branch, MD  metoprolol tartrate (LOPRESSOR) 25 MG tablet Take 1 tablet (25 mg total) by mouth 2 (two) times daily. Patient taking differently: Take 50 mg by mouth at bedtime.  06/24/16  Yes Colon Branch, MD  omeprazole (PRILOSEC) 40 MG capsule Take 1 capsule (40 mg total) by mouth daily. 11/01/15  Yes Colon Branch, MD  pravastatin (PRAVACHOL) 40 MG  tablet Take 2 tablets (80 mg total) by mouth daily. 07/03/16  Yes Colon Branch, MD  sitaGLIPtin (JANUVIA) 100 MG tablet Take 1 tablet (100 mg total) by mouth daily. 07/03/16  Yes Colon Branch, MD  vitamin B-12 (CYANOCOBALAMIN) 1000 MCG tablet Take 1 tablet (1,000 mcg total) by mouth daily. 10/02/15  Yes Colon Branch, MD  warfarin (COUMADIN) 3 MG tablet TAKE AS DIRECTED BY  COUMADIN  CLINIC Patient taking differently: Take 1.5-3 mg by mouth SEE DIRECTIONS: 3 mg at night on Sun/Mon/Tues/Thurs/Fri and 1.5 mg on Wed/Sat 05/07/16  Yes Colon Branch, MD  nitroGLYCERIN (NITROSTAT) 0.4 MG SL tablet Place 0.4 mg under the tongue every 5 (  five) minutes as needed for chest pain. Reported on 09/27/2015    Historical Provider, MD    Physical Exam: Vitals:   08/08/16 1306 08/08/16 1416  BP: 131/71 (!) 152/71  Pulse: 64 67  Resp: (!) 21 18  Temp: 97.4 F (36.3 C)   TempSrc: Oral   SpO2: 95% 98%    Constitutional: NAD, calm, comfortable Vitals:   08/08/16 1306 08/08/16 1416  BP: 131/71 (!) 152/71  Pulse: 64 67  Resp: (!) 21 18  Temp: 97.4 F (36.3 C)   TempSrc: Oral   SpO2: 95% 98%   Eyes: No scleral icterus, EOMI ENMT: Mucous membranes are moist. Posterior pharynx clear of any exudate or lesions. Neck: normal, supple, no masses, no thyromegaly Respiratory: clear to auscultation bilaterally, no wheezing, no crackles. Normal respiratory effort. No accessory muscle use.  Cardiovascular: Regular rate and rhythm, no murmurs / rubs , No bruits Abdomen: no tenderness, no masses palpated. No hepatosplenomegaly. Bowel sounds positive.  Musculoskeletal: no clubbing / cyanosis. No joint deformity upper and lower extremities. Good ROM, no contractures. Normal muscle tone.  Skin: Multiple facial abrasions and contusions, skin warm and dry Neurologic: Answers questions appropriately, no facial asymmetry, Sensation intact to light touch  Psychiatric:  Normal mood and affect    Labs on Admission: I have personally  reviewed following labs and imaging studies  CBC:  Recent Labs Lab 08/08/16 1410  WBC 10.8*  NEUTROABS 9.3*  HGB 13.8  HCT 41.2  MCV 95.2  PLT 768   Basic Metabolic Panel:  Recent Labs Lab 08/08/16 1410  NA 138  K 4.6  CL 105  CO2 24  GLUCOSE 256*  BUN 18  CREATININE 1.21  CALCIUM 8.5*   GFR: CrCl cannot be calculated (Unknown ideal weight.). Liver Function Tests: No results for input(s): AST, ALT, ALKPHOS, BILITOT, PROT, ALBUMIN in the last 168 hours. No results for input(s): LIPASE, AMYLASE in the last 168 hours. No results for input(s): AMMONIA in the last 168 hours. Coagulation Profile:  Recent Labs Lab 08/08/16 1410  INR 2.43   Cardiac Enzymes: No results for input(s): CKTOTAL, CKMB, CKMBINDEX, TROPONINI in the last 168 hours. BNP (last 3 results) No results for input(s): PROBNP in the last 8760 hours. HbA1C: No results for input(s): HGBA1C in the last 72 hours. CBG: No results for input(s): GLUCAP in the last 168 hours. Lipid Profile: No results for input(s): CHOL, HDL, LDLCALC, TRIG, CHOLHDL, LDLDIRECT in the last 72 hours. Thyroid Function Tests: No results for input(s): TSH, T4TOTAL, FREET4, T3FREE, THYROIDAB in the last 72 hours. Anemia Panel: No results for input(s): VITAMINB12, FOLATE, FERRITIN, TIBC, IRON, RETICCTPCT in the last 72 hours. Urine analysis:    Component Value Date/Time   COLORURINE YELLOW 08/08/2016 1547   APPEARANCEUR CLEAR 08/08/2016 1547   LABSPEC 1.021 08/08/2016 1547   PHURINE 6.0 08/08/2016 1547   GLUCOSEU >=500 (A) 08/08/2016 1547   HGBUR NEGATIVE 08/08/2016 1547   BILIRUBINUR NEGATIVE 08/08/2016 1547   KETONESUR 5 (A) 08/08/2016 1547   PROTEINUR NEGATIVE 08/08/2016 1547   UROBILINOGEN 0.2 08/29/2012 1129   NITRITE NEGATIVE 08/08/2016 1547   LEUKOCYTESUR NEGATIVE 08/08/2016 1547    Radiological Exams on Admission: Dg Wrist Complete Left  Result Date: 08/08/2016 CLINICAL DATA:  Status post fall today and  complains of mid to distal right arm pain. No definite wrist pain. The patient does not were called the particulars of the fall. EXAM: LEFT WRIST - COMPLETE 3+ VIEW COMPARISON:  None in PACs FINDINGS:  The bones of the left wrist are subjectively adequately mineralized. The distal radius and ulna are intact. There is mild narrowing of the radiocarpal joint. There is moderate severe narrowing of the first [facility] joint. No acute carpal bone fracture is observed. The metacarpals are intact where visualized. IMPRESSION: There are degenerative changes of the left wrist centered on the radiocarpal joint and first [facility] joint. There is no acute fracture nor dislocation. Electronically Signed   By: David  [name] M.D.   On: 08/08/2016 13:57   Ct Head Wo Contrast  Result Date: 08/08/2016 CLINICAL DATA:  Head and neck pain and facial injuries after fall. EXAM: CT HEAD WITHOUT CONTRAST CT MAXILLOFACIAL WITHOUT CONTRAST CT CERVICAL SPINE WITHOUT CONTRAST TECHNIQUE: Multidetector CT imaging of the head, cervical spine, and maxillofacial structures were performed using the standard protocol without intravenous contrast. Multiplanar CT image reconstructions of the cervical spine and maxillofacial structures were also generated. COMPARISON:  CT scan of May 22, 2007. FINDINGS: CT HEAD FINDINGS Brain: Mild chronic ischemic white matter disease is noted. Mild diffuse cortical atrophy is noted. No mass effect or midline shift is noted. Ventricular size is within normal limits. There is no evidence of mass lesion or acute infarction. Small high density focus is seen in right posterior parietal sulcus concerning for small focus of subarachnoid hemorrhage. Vascular: No hyperdense vessel or unexpected calcification. Skull: Normal. Negative for fracture or focal lesion. Other: Old lacunar infarctions are noted in both basal ganglia bilaterally. Small left frontal scalp hematoma is noted. CT MAXILLOFACIAL FINDINGS Osseous: Moderately  displaced and comminuted nasal bone fracture is noted, which is displaced to the right. Orbits: Negative. No traumatic or inflammatory finding. Sinuses: Right ethmoid sinusitis is noted. Bilateral maxillary sinusitis is noted. Soft tissues: Negative. CT CERVICAL SPINE FINDINGS Alignment: Normal. Skull base and vertebrae: No acute fracture. No primary bone lesion or focal pathologic process. Soft tissues and spinal canal: No prevertebral fluid or swelling. No visible canal hematoma. Disc levels: Severe degenerative disc disease is noted at C3-4, C4-5, C5-6 and C6-7 with anterior and posterior osteophyte formation. Upper chest: Negative. Other: None. IMPRESSION: Mild chronic ischemic white matter disease. Mild diffuse cortical atrophy. Small left frontal scalp hematoma. Probable small focus of subarachnoid hemorrhage seen in right posterior parietal sulcus. Moderately displaced and comminuted nasal bone fracture is noted. Severe multilevel degenerative disc disease is noted in the cervical spine. No fracture or spondylolisthesis is noted. Critical Value/emergent results were called by telephone at the time of interpretation on 08/08/2016 at 2:12 pm to Dr. Dalia Heading , who verbally acknowledged these results. Electronically Signed   By: Marijo Conception, M.D.   On: 08/08/2016 14:13   Ct Cervical Spine Wo Contrast  Result Date: 08/08/2016 CLINICAL DATA:  Head and neck pain and facial injuries after fall. EXAM: CT HEAD WITHOUT CONTRAST CT MAXILLOFACIAL WITHOUT CONTRAST CT CERVICAL SPINE WITHOUT CONTRAST TECHNIQUE: Multidetector CT imaging of the head, cervical spine, and maxillofacial structures were performed using the standard protocol without intravenous contrast. Multiplanar CT image reconstructions of the cervical spine and maxillofacial structures were also generated. COMPARISON:  CT scan of May 22, 2007. FINDINGS: CT HEAD FINDINGS Brain: Mild chronic ischemic white matter disease is noted. Mild  diffuse cortical atrophy is noted. No mass effect or midline shift is noted. Ventricular size is within normal limits. There is no evidence of mass lesion or acute infarction. Small high density focus is seen in right posterior parietal sulcus concerning for small focus of subarachnoid hemorrhage. Vascular:  No hyperdense vessel or unexpected calcification. Skull: Normal. Negative for fracture or focal lesion. Other: Old lacunar infarctions are noted in both basal ganglia bilaterally. Small left frontal scalp hematoma is noted. CT MAXILLOFACIAL FINDINGS Osseous: Moderately displaced and comminuted nasal bone fracture is noted, which is displaced to the right. Orbits: Negative. No traumatic or inflammatory finding. Sinuses: Right ethmoid sinusitis is noted. Bilateral maxillary sinusitis is noted. Soft tissues: Negative. CT CERVICAL SPINE FINDINGS Alignment: Normal. Skull base and vertebrae: No acute fracture. No primary bone lesion or focal pathologic process. Soft tissues and spinal canal: No prevertebral fluid or swelling. No visible canal hematoma. Disc levels: Severe degenerative disc disease is noted at C3-4, C4-5, C5-6 and C6-7 with anterior and posterior osteophyte formation. Upper chest: Negative. Other: None. IMPRESSION: Mild chronic ischemic white matter disease. Mild diffuse cortical atrophy. Small left frontal scalp hematoma. Probable small focus of subarachnoid hemorrhage seen in right posterior parietal sulcus. Moderately displaced and comminuted nasal bone fracture is noted. Severe multilevel degenerative disc disease is noted in the cervical spine. No fracture or spondylolisthesis is noted. Critical Value/emergent results were called by telephone at the time of interpretation on 08/08/2016 at 2:12 pm to Dr. Dalia Heading , who verbally acknowledged these results. Electronically Signed   By: Marijo Conception, M.D.   On: 08/08/2016 14:13   Dg Humerus Right  Result Date: 08/08/2016 CLINICAL DATA:   Right mid to distal arm pain following a fall today. EXAM: RIGHT HUMERUS - 2+ VIEW COMPARISON:  Right shoulder series of March 15, 2013. FINDINGS: The humerus is subjectively adequately mineralized. The observed portions of the shoulder and elbow are intact. There is narrowing of the subacromial subdeltoid space which appears chronic when compared to the previous study. The soft tissues of the arm are unremarkable. IMPRESSION: There is no acute bony abnormality of the right humerus. Electronically Signed   By: David  [name] M.D.   On: 08/08/2016 14:00   Ct Maxillofacial Wo Cm  Result Date: 08/08/2016 CLINICAL DATA:  Head and neck pain and facial injuries after fall. EXAM: CT HEAD WITHOUT CONTRAST CT MAXILLOFACIAL WITHOUT CONTRAST CT CERVICAL SPINE WITHOUT CONTRAST TECHNIQUE: Multidetector CT imaging of the head, cervical spine, and maxillofacial structures were performed using the standard protocol without intravenous contrast. Multiplanar CT image reconstructions of the cervical spine and maxillofacial structures were also generated. COMPARISON:  CT scan of May 22, 2007. FINDINGS: CT HEAD FINDINGS Brain: Mild chronic ischemic white matter disease is noted. Mild diffuse cortical atrophy is noted. No mass effect or midline shift is noted. Ventricular size is within normal limits. There is no evidence of mass lesion or acute infarction. Small high density focus is seen in right posterior parietal sulcus concerning for small focus of subarachnoid hemorrhage. Vascular: No hyperdense vessel or unexpected calcification. Skull: Normal. Negative for fracture or focal lesion. Other: Old lacunar infarctions are noted in both basal ganglia bilaterally. Small left frontal scalp hematoma is noted. CT MAXILLOFACIAL FINDINGS Osseous: Moderately displaced and comminuted nasal bone fracture is noted, which is displaced to the right. Orbits: Negative. No traumatic or inflammatory finding. Sinuses: Right ethmoid sinusitis is  noted. Bilateral maxillary sinusitis is noted. Soft tissues: Negative. CT CERVICAL SPINE FINDINGS Alignment: Normal. Skull base and vertebrae: No acute fracture. No primary bone lesion or focal pathologic process. Soft tissues and spinal canal: No prevertebral fluid or swelling. No visible canal hematoma. Disc levels: Severe degenerative disc disease is noted at C3-4, C4-5, C5-6 and C6-7 with anterior and posterior  osteophyte formation. Upper chest: Negative. Other: None. IMPRESSION: Mild chronic ischemic white matter disease. Mild diffuse cortical atrophy. Small left frontal scalp hematoma. Probable small focus of subarachnoid hemorrhage seen in right posterior parietal sulcus. Moderately displaced and comminuted nasal bone fracture is noted. Severe multilevel degenerative disc disease is noted in the cervical spine. No fracture or spondylolisthesis is noted. Critical Value/emergent results were called by telephone at the time of interpretation on 08/08/2016 at 2:12 pm to Dr. Dalia Heading , who verbally acknowledged these results. Electronically Signed   By: Marijo Conception, M.D.   On: 08/08/2016 14:13    EKG: Independently reviewed. Paced rhythm no ST elevations or depressions  Assessment/Plan Principal Problem:   SAH (subarachnoid hemorrhage) [facility]) - Neurosurgery consulted and plan is for observation with neuro checks and repeat CT of head in 12 hours   Active Problems:   Syncope and collapse - Unclear etiology. As such will workup. Obtain carotid Dopplers, telemetry monitoring, troponins every 6 hours 3, echocardiogram    Hypothyroidism - Continue Synthroid    DM (diabetes mellitus) with complications ([place]) - Hold home Januvia. Continue Lantus and sliding scale insulin    Dyslipidemia - Continue statin    HTN (hypertension) - Continue beta blocker    CAD (coronary artery disease) of artery bypass graft - Continue statin, fibrate, unable to continue aspirin given principle  problem    Atrial fibrillation (HCC) - holding coumadin  - continue B blocker  DVT prophylaxis: SCD's Code Status: full Family Communication: none at bedside Disposition Plan: telemetry monitoring Consults called: *Neurosurgeon: Ferne Reus Admission status: obs   Velvet Bathe MD [facility] Pager [phone]  If 7PM-7AM, please contact night-coverage [email or web address] Password  [facility]  08/08/2016, 5:42 PM

## 2016-08-08 NOTE — Consult Note (Signed)
CC:  Chief Complaint  Patient presents with  . Fall    HPI: [name] is a 83 y.o. male who was brought to the ER by EMS after being found on the ground unconscious. Present with daughters. Patient reports he was outside doing yard work and then woke up on the ground. The incident was unwitnessed - unclear whether he had a syncopal episode vs fall. He did not lose control or bowel or bladder. He suffered multiple facial injuries. He reports he feels well with the exception of right arm pain. He is able to move all extremities. He denies headache, dizziness, changes in vision. He is on coumadin for Afib.   PMH: Past Medical History:  Diagnosis Date  . AAA (abdominal aortic aneurysm) ([place])   . Anemia due to chronic blood loss 11/25/2008   Recurrent over the years EGD and colonoscopy x 2 each 2004 and 2010 without cause    . Atrial fibrillation ([place])   . BPH (benign prostatic hyperplasia)    reports aprocedure (TURP) remotely in [place] KitchenNo futher f/u w/ urology  . CAD (coronary artery disease)   . Diabetes mellitus, type 2 ([place])   . Diverticulosis    left colon  . Erosive gastritis   . GERD (gastroesophageal reflux disease)   . Hyperlipidemia   . Hypertension   . Hypothyroidism   . Insomnia    transient  . Osteopenia    dexa 2-11  . Vitamin B12 deficiency     PSH: Past Surgical History:  Procedure Laterality Date  . CARDIAC CATHETERIZATION  01/31/06, 09/25/10, 09-2011  . CATARACT EXTRACTION     right  . CHOLECYSTECTOMY, LAPAROSCOPIC    . COLONOSCOPY     multiple  . CORONARY ARTERY BYPASS GRAFT     1999 stents in 2000  . ESOPHAGOGASTRODUODENOSCOPY     multiple  . GIVENS CAPSULE STUDY N/A 11/04/2012   Procedure: GIVENS CAPSULE STUDY;  Surgeon: Gatha Mayer, MD;  Location: [facility] ENDOSCOPY;  Service: Endoscopy;  Laterality: N/A;  . inguinal herniorrhaphies     bilateral  . LEFT HEART CATHETERIZATION WITH CORONARY ANGIOGRAM N/A 10/04/2011   Procedure: LEFT HEART CATHETERIZATION  WITH CORONARY ANGIOGRAM;  Surgeon: Burnell Blanks, MD;  Location: [facility] CATH LAB;  Service: Cardiovascular;  Laterality: N/A;  . pacemaker  [place], 2013   medtronic minix [id number]  . PENILE PROSTHESIS IMPLANT  1992  . PERMANENT PACEMAKER GENERATOR CHANGE N/A 06/05/2011   Procedure: PERMANENT PACEMAKER GENERATOR CHANGE;  Surgeon: Evans Lance, MD;  Location: [facility] CATH LAB;  Service: Cardiovascular;  Laterality: N/A;  . PROSTATECTOMY     transurethral  . right hip replacement    . TRANSTHORACIC ECHOCARDIOGRAM  12/2006    SH: Social History  Substance Use Topics  . Smoking status: Never Smoker  . Smokeless tobacco: Never Used  . Alcohol use 0.6 - 1.2 oz/week    1 - 2 Glasses of wine per week     Comment: socially     MEDS: Prior to Admission medications   Medication Sig Start Date End Date Taking? Authorizing Provider  Blood Glucose Monitoring Suppl (TRUE METRIX METER) w/Device KIT Check blood sugar four times daily. 10/04/15  Yes Colon Branch, MD  EASY [facility] INSULIN SYRINGE 31G X 5/16" 1 ML MISC USE AS DIRECTED WITH NPH INSULIN 11/16/13  Yes Colon Branch, MD  glucose blood (TRUE METRIX BLOOD GLUCOSE TEST) test strip Check blood sugar four times daily. 10/04/15  Yes [place]  Paz, MD  aspirin 81 MG tablet Take 81 mg by mouth daily.    Historical Provider, MD  digoxin (LANOXIN) 0.125 MG tablet Take 0.5 tablets (0.0625 mg total) by mouth daily. 06/17/16   Evans Lance, MD  fenofibrate micronized (LOFIBRA) 134 MG capsule Take 1 capsule (134 mg total) by mouth daily. 07/17/16   Colon Branch, MD  Insulin Glargine (LANTUS) 100 UNIT/ML Solostar Pen Inject 50 units into the skin every night. 09/29/15   Colon Branch, MD  Insulin Pen Needle 31G X 6 MM MISC To use w/ Lantus 10/02/15   Colon Branch, MD  levothyroxine (SYNTHROID, LEVOTHROID) 88 MCG tablet Take 1 tablet (88 mcg total) by mouth daily before breakfast. 07/19/16   Colon Branch, MD  metoprolol tartrate (LOPRESSOR) 25 MG tablet Take 1 tablet (25 mg total) by  mouth 2 (two) times daily. 06/24/16   Colon Branch, MD  nitroGLYCERIN (NITROSTAT) 0.4 MG SL tablet Place 0.4 mg under the tongue every 5 (five) minutes as needed. Reported on 09/27/2015    Historical Provider, MD  omeprazole (PRILOSEC) 40 MG capsule Take 1 capsule (40 mg total) by mouth daily. 11/01/15   Colon Branch, MD  pravastatin (PRAVACHOL) 40 MG tablet Take 2 tablets (80 mg total) by mouth daily. 07/03/16   Colon Branch, MD  sitaGLIPtin (JANUVIA) 100 MG tablet Take 1 tablet (100 mg total) by mouth daily. 07/03/16   Colon Branch, MD  vitamin B-12 (CYANOCOBALAMIN) 1000 MCG tablet Take 1 tablet (1,000 mcg total) by mouth daily. 10/02/15   Colon Branch, MD  warfarin (COUMADIN) 3 MG tablet TAKE AS DIRECTED BY  COUMADIN  CLINIC 05/07/16   Colon Branch, MD    ALLERGY: Allergies  Allergen Reactions  . Ace Inhibitors Cough  . Codeine Phosphate Nausea Only  . Hydrochlorothiazide W-Triamterene Other (See Comments)    CRAMPING    ROS: Review of Systems  Constitutional: Negative for chills, diaphoresis, fever, malaise/fatigue and weight loss.  HENT: Negative for hearing loss and tinnitus.   Eyes: Negative for blurred vision and double vision.  Cardiovascular: Negative for chest pain and palpitations.  Gastrointestinal: Negative for abdominal pain, diarrhea, heartburn, nausea and vomiting.  Musculoskeletal: Positive for falls and myalgias. Negative for back pain, joint pain and neck pain.  Skin: Negative for rash.  Neurological: Negative for dizziness, tingling, tremors, sensory change, speech change, focal weakness, seizures, loss of consciousness, weakness and headaches.  Endo/Heme/Allergies: Bruises/bleeds easily.  Psychiatric/Behavioral: Negative for depression.    Vitals:   08/08/16 1306 08/08/16 1416  BP: 131/71 (!) 152/71  Pulse: 64 67  Resp: (!) 21 18  Temp: 97.4 F (36.3 C)    General appearance: NAD. Several abrasions with bleeding on face.  Eyes: PERRL Cardiovascular: IR IR Pulmonary:  Clear to auscultation Musculoskeletal:     Muscle tone upper extremities: Normal    Muscle tone lower extremities: Normal    Motor exam: Upper Extremities Deltoid Bicep Tricep Grip  Right 5/5 5/5 5/5 5/5  Left 5/5 5/5 5/5 5/5   Lower Extremity IP Quad PF DF EHL  Right 5/5 5/5 5/5 5/5 5/5  Left 5/5 5/5 5/5 5/5 5/5   Neurological Awake, alert, oriented Memory and concentration grossly intact Speech fluent, appropriate CNII: Visual fields normal CNIII/IV/VI: EOMI CNV: Facial sensation normal CNVII: Symmetric, normal strength CNVIII: Grossly normal CNIX: Normal palate movement CNXI: Trap and SCM strength normal CN XII: Tongue protrusion normal Sensation grossly intact to LT  DTR: Normal  IMAGING: CT Head IMPRESSION: Mild chronic ischemic white matter disease. Mild diffuse cortical atrophy. Small left frontal scalp hematoma. Probable small focus of subarachnoid hemorrhage seen in right posterior parietal sulcus.  IMPRESSION/PLAN: - 83 y.o. male with small, focal SAH s/p unwitnessed fall vs syncopal episode. He will be admitted under hospitalist service for further work up. He is neurologically intact with the exception of amnesia of the event. At this time, no acute neurosurigcal intervention is necessary. Monitor neuro exam q 1 hour. Repeat CT scan 12 hours. Sooner as necessary. Start on Keppra 574m BID x7days. Call for any concerns. Case discussed with Dr DCyndy Freeze

## 2016-08-08 NOTE — ED Notes (Signed)
Pt alert and conversing with family at bedside. Updated on poc. Pt and family agree. Pt denies pain other than pain in right forearm with movement. Pulses all palpable and strong. Warm blankets given

## 2016-08-09 ENCOUNTER — Observation Stay (HOSPITAL_BASED_OUTPATIENT_CLINIC_OR_DEPARTMENT_OTHER): Payer: Medicare HMO

## 2016-08-09 ENCOUNTER — Observation Stay (HOSPITAL_COMMUNITY): Payer: Medicare HMO

## 2016-08-09 DIAGNOSIS — R9082 White matter disease, unspecified: Secondary | ICD-10-CM | POA: Diagnosis present

## 2016-08-09 DIAGNOSIS — R413 Other amnesia: Secondary | ICD-10-CM | POA: Diagnosis present

## 2016-08-09 DIAGNOSIS — M75111 Incomplete rotator cuff tear or rupture of right shoulder, not specified as traumatic: Secondary | ICD-10-CM | POA: Diagnosis not present

## 2016-08-09 DIAGNOSIS — W19XXXA Unspecified fall, initial encounter: Secondary | ICD-10-CM | POA: Diagnosis present

## 2016-08-09 DIAGNOSIS — I10 Essential (primary) hypertension: Secondary | ICD-10-CM

## 2016-08-09 DIAGNOSIS — M75121 Complete rotator cuff tear or rupture of right shoulder, not specified as traumatic: Secondary | ICD-10-CM | POA: Diagnosis present

## 2016-08-09 DIAGNOSIS — I482 Chronic atrial fibrillation: Secondary | ICD-10-CM

## 2016-08-09 DIAGNOSIS — I2510 Atherosclerotic heart disease of native coronary artery without angina pectoris: Secondary | ICD-10-CM | POA: Diagnosis present

## 2016-08-09 DIAGNOSIS — E039 Hypothyroidism, unspecified: Secondary | ICD-10-CM | POA: Diagnosis present

## 2016-08-09 DIAGNOSIS — I609 Nontraumatic subarachnoid hemorrhage, unspecified: Secondary | ICD-10-CM | POA: Diagnosis not present

## 2016-08-09 DIAGNOSIS — E785 Hyperlipidemia, unspecified: Secondary | ICD-10-CM | POA: Diagnosis present

## 2016-08-09 DIAGNOSIS — S4991XD Unspecified injury of right shoulder and upper arm, subsequent encounter: Secondary | ICD-10-CM | POA: Diagnosis not present

## 2016-08-09 DIAGNOSIS — M5030 Other cervical disc degeneration, unspecified cervical region: Secondary | ICD-10-CM | POA: Diagnosis present

## 2016-08-09 DIAGNOSIS — R55 Syncope and collapse: Secondary | ICD-10-CM

## 2016-08-09 DIAGNOSIS — Z96641 Presence of right artificial hip joint: Secondary | ICD-10-CM | POA: Diagnosis present

## 2016-08-09 DIAGNOSIS — Z950 Presence of cardiac pacemaker: Secondary | ICD-10-CM | POA: Diagnosis not present

## 2016-08-09 DIAGNOSIS — S46011A Strain of muscle(s) and tendon(s) of the rotator cuff of right shoulder, initial encounter: Secondary | ICD-10-CM | POA: Diagnosis present

## 2016-08-09 DIAGNOSIS — S022XXD Fracture of nasal bones, subsequent encounter for fracture with routine healing: Secondary | ICD-10-CM | POA: Diagnosis not present

## 2016-08-09 DIAGNOSIS — E538 Deficiency of other specified B group vitamins: Secondary | ICD-10-CM | POA: Diagnosis present

## 2016-08-09 DIAGNOSIS — S0081XA Abrasion of other part of head, initial encounter: Secondary | ICD-10-CM | POA: Diagnosis present

## 2016-08-09 DIAGNOSIS — S022XXA Fracture of nasal bones, initial encounter for closed fracture: Secondary | ICD-10-CM | POA: Diagnosis present

## 2016-08-09 DIAGNOSIS — I25810 Atherosclerosis of coronary artery bypass graft(s) without angina pectoris: Secondary | ICD-10-CM | POA: Diagnosis present

## 2016-08-09 DIAGNOSIS — K219 Gastro-esophageal reflux disease without esophagitis: Secondary | ICD-10-CM | POA: Diagnosis present

## 2016-08-09 DIAGNOSIS — R402362 Coma scale, best motor response, obeys commands, at arrival to emergency department: Secondary | ICD-10-CM | POA: Diagnosis present

## 2016-08-09 DIAGNOSIS — S066X9A Traumatic subarachnoid hemorrhage with loss of consciousness of unspecified duration, initial encounter: Secondary | ICD-10-CM | POA: Diagnosis present

## 2016-08-09 DIAGNOSIS — R220 Localized swelling, mass and lump, head: Secondary | ICD-10-CM | POA: Diagnosis not present

## 2016-08-09 DIAGNOSIS — E118 Type 2 diabetes mellitus with unspecified complications: Secondary | ICD-10-CM

## 2016-08-09 DIAGNOSIS — R402142 Coma scale, eyes open, spontaneous, at arrival to emergency department: Secondary | ICD-10-CM | POA: Diagnosis present

## 2016-08-09 DIAGNOSIS — I4891 Unspecified atrial fibrillation: Secondary | ICD-10-CM | POA: Diagnosis present

## 2016-08-09 DIAGNOSIS — E1165 Type 2 diabetes mellitus with hyperglycemia: Secondary | ICD-10-CM | POA: Diagnosis present

## 2016-08-09 DIAGNOSIS — S0003XA Contusion of scalp, initial encounter: Secondary | ICD-10-CM | POA: Diagnosis present

## 2016-08-09 DIAGNOSIS — R402242 Coma scale, best verbal response, confused conversation, at arrival to emergency department: Secondary | ICD-10-CM | POA: Diagnosis present

## 2016-08-09 LAB — VAS US CAROTID
LCCADDIAS: -13 cm/s
LCCADSYS: -86 cm/s
LEFT ECA DIAS: -10 cm/s
LEFT VERTEBRAL DIAS: -13 cm/s
LICADSYS: -132 cm/s
LICAPDIAS: -16 cm/s
Left CCA prox dias: 15 cm/s
Left CCA prox sys: 88 cm/s
Left ICA dist dias: -33 cm/s
Left ICA prox sys: -81 cm/s
RCCADSYS: 90 cm/s
RCCAPSYS: -133 cm/s
RIGHT ECA DIAS: -10 cm/s
RIGHT VERTEBRAL DIAS: -10 cm/s
Right CCA prox dias: -19 cm/s

## 2016-08-09 LAB — ECHOCARDIOGRAM COMPLETE
CHL CUP RV SYS PRESS: 39 mmHg
EERAT: 5.89
EWDT: 176 ms
FS: 23 % — AB (ref 28–44)
Height: 67 in
IV/PV OW: 1.06
LA diam end sys: 47 mm
LA vol index: 45.6 mL/m2
LA vol: 84.4 mL
LADIAMINDEX: 2.54 cm/m2
LASIZE: 47 mm
LAVOLA4C: 82.6 mL
LV E/e' medial: 5.89
LV TDI E'LATERAL: 13.1
LV TDI E'MEDIAL: 6.58
LVEEAVG: 5.89
LVELAT: 13.1 cm/s
LVOT VTI: 14.8 cm
LVOT area: 3.14 cm2
LVOT diameter: 20 mm
LVOTPV: 79.8 cm/s
LVOTSV: 46 mL
Lateral S' vel: 11.4 cm/s
MV Dec: 176
MV Peak grad: 2 mmHg
MV pk A vel: 32 m/s
MVPKEVEL: 77.1 m/s
P 1/2 time: 536 ms
PW: 15 mm — AB (ref 0.6–1.1)
Reg peak vel: 247 cm/s
TAPSE: 13.7 mm
TRMAXVEL: 247 cm/s
Weight: 2604.8 oz

## 2016-08-09 LAB — BASIC METABOLIC PANEL
Anion gap: 7 (ref 5–15)
BUN: 14 mg/dL (ref 6–20)
CALCIUM: 8.6 mg/dL — AB (ref 8.9–10.3)
CO2: 23 mmol/L (ref 22–32)
Chloride: 107 mmol/L (ref 101–111)
Creatinine, Ser: 1.09 mg/dL (ref 0.61–1.24)
GFR calc Af Amer: >60 mL/min (ref >60)
GFR calc non Af Amer: 59 mL/min — ABNORMAL LOW (ref >60)
GLUCOSE: 93 mg/dL (ref 65–99)
Potassium: 3.8 mmol/L (ref 3.5–5.1)
Sodium: 137 mmol/L (ref 135–145)

## 2016-08-09 LAB — CBC
HCT: 39.9 % (ref 39.0–52.0)
Hemoglobin: 13.1 g/dL (ref 13.0–17.0)
MCH: 31.1 pg (ref 26.0–34.0)
MCHC: 32.8 g/dL (ref 30.0–36.0)
MCV: 94.8 fL (ref 78.0–100.0)
PLATELETS: 146 10*3/uL — AB (ref 150–400)
RBC: 4.21 MIL/uL — ABNORMAL LOW (ref 4.22–5.81)
RDW: 14.1 % (ref 11.5–15.5)
WBC: 11 10*3/uL — ABNORMAL HIGH (ref 4.0–10.5)

## 2016-08-09 LAB — GLUCOSE, CAPILLARY
GLUCOSE-CAPILLARY: 114 mg/dL — AB (ref 65–99)
GLUCOSE-CAPILLARY: 267 mg/dL — AB (ref 65–99)
GLUCOSE-CAPILLARY: 260 mg/dL — AB (ref 65–99)
Glucose-Capillary: 241 mg/dL — ABNORMAL HIGH (ref 65–99)

## 2016-08-09 LAB — TROPONIN I
Troponin I: <0.03 ng/mL (ref <0.03)
Troponin I: <0.03 ng/mL (ref <0.03)

## 2016-08-09 MED ORDER — WARFARIN SODIUM 3 MG PO TABS: ORAL_TABLET | ORAL | 0 refills | Status: DC

## 2016-08-09 MED ORDER — SODIUM CHLORIDE 0.9 % IV SOLN
INTRAVENOUS | Status: DC
  Administered 2016-08-09 – 2016-08-10 (×2): via INTRAVENOUS

## 2016-08-09 MED ORDER — TRAMADOL HCL 50 MG PO TABS
50.0000 mg | ORAL_TABLET | Freq: Four times a day (QID) | ORAL | Status: DC | PRN
  Administered 2016-08-10 – 2016-08-13 (×7): 50 mg via ORAL
  Filled 2016-08-09 (×7): qty 1

## 2016-08-09 MED ORDER — HYDROMORPHONE HCL 1 MG/ML IJ SOLN
1.0000 mg | INTRAMUSCULAR | Status: DC | PRN
  Administered 2016-08-09 – 2016-08-12 (×6): 1 mg via INTRAVENOUS
  Filled 2016-08-09 (×6): qty 1

## 2016-08-09 MED ORDER — INSULIN ASPART 100 UNIT/ML ~~LOC~~ SOLN
0.0000 [IU] | Freq: Three times a day (TID) | SUBCUTANEOUS | Status: DC
Start: 2016-08-09 — End: 2016-08-09
  Administered 2016-08-09: 3 [IU] via SUBCUTANEOUS
  Administered 2016-08-09: 5 [IU] via SUBCUTANEOUS

## 2016-08-09 MED ORDER — INSULIN ASPART 100 UNIT/ML ~~LOC~~ SOLN
0.0000 [IU] | Freq: Three times a day (TID) | SUBCUTANEOUS | Status: DC
  Administered 2016-08-10: 3 [IU] via SUBCUTANEOUS
  Administered 2016-08-10: 1 [IU] via SUBCUTANEOUS
  Administered 2016-08-10: 2 [IU] via SUBCUTANEOUS
  Administered 2016-08-11 (×2): 3 [IU] via SUBCUTANEOUS
  Administered 2016-08-11 – 2016-08-12 (×2): 2 [IU] via SUBCUTANEOUS
  Administered 2016-08-12: 3 [IU] via SUBCUTANEOUS
  Administered 2016-08-12: 2 [IU] via SUBCUTANEOUS
  Administered 2016-08-13: 7 [IU] via SUBCUTANEOUS
  Administered 2016-08-13: 3 [IU] via SUBCUTANEOUS

## 2016-08-09 MED ORDER — ACETAMINOPHEN 500 MG PO TABS
1000.0000 mg | ORAL_TABLET | Freq: Three times a day (TID) | ORAL | Status: AC
  Administered 2016-08-09 – 2016-08-12 (×9): 1000 mg via ORAL
  Filled 2016-08-09 (×9): qty 2

## 2016-08-09 MED ORDER — LEVETIRACETAM 500 MG PO TABS: 500.0000 mg | ORAL_TABLET | Freq: Two times a day (BID) | ORAL | 0 refills | Status: DC

## 2016-08-09 MED ORDER — INSULIN GLARGINE 100 UNIT/ML ~~LOC~~ SOLN
30.0000 [IU] | Freq: Every day | SUBCUTANEOUS | Status: DC
Start: 2016-08-09 — End: 2016-08-11
  Administered 2016-08-09 – 2016-08-10 (×2): 30 [IU] via SUBCUTANEOUS
  Filled 2016-08-09 (×4): qty 0.3

## 2016-08-09 NOTE — Progress Notes (Signed)
No issues overnight. Feels well.  Denies neurological deficits/symptoms.  EXAM:  BP 103/67 (BP Location: Left Arm)   Pulse 70   Temp 98.5 F (36.9 C) (Oral)   Resp 18   Ht 5\' 7"  (1.702 m)   Wt 73.8 kg (162 lb 12.8 oz) Comment: Scale B  SpO2 98%   BMI 25.50 kg/m   Awake, alert, oriented  Speech fluent, appropriate  CN grossly intact  5/5 BUE/BLE   IMPRESSION/PLAN 83 y.o. male presented to ER yesterday for syncopal episode vs. Fall. Suffered small SAH. Placed in observation and had repeat scan earlier today. Scan shows SAH is no longer seen. He remains neurologically intact. Continue Keppra for complete 7 day course. F/U outpt. NS will sign off. Call for any concerns.

## 2016-08-09 NOTE — Progress Notes (Signed)
[facility]                                                                              Patient Demographics  [name], is a 83 y.o. male, DOB - 26-Jun-1925, WEX:[id number]  Admit date - 08/08/2016   Admitting Physician Nicholas Bathe, MD  Outpatient Primary MD for the patient is Nicholas November, MD  Outpatient specialists:   LOS - 0  days    Chief Complaint  Patient presents with  . Fall       Brief summary   Patient is a 83 year old male with history of atrial fibrillation, CAD status post CABG presented to the ED after a fall and had multiple facial abrasions. Patient was unaware of why he fell. Patient reported that he was helping his neighbor blow the leaves in the yard and then he may have passed out or tripped. Per his daughter he does have a history of vasovagal episodes. He did not have any bowel or bladder incontinence, no fitness seizure-like activity. CT of the head showed probable small focus of subarachnoid hemorrhage seen in the right posterior parietal sulcus and moderately displaced and comminuted nasal bone fracture. Patient was admitted overnight. Neurosurgery consult was obtained.   Assessment & Plan    Principal Problem:   SAH (subarachnoid hemorrhage) (HCC) - CT head showed Probable small focus of subarachnoid hemorrhage seen in right posterior parietal sulcus. - Neurosurgery was consulted and recommended observation overnight and repeat CT head in 12 hours, Keppra for 7 days - Repeat CT head was done which showed previously noted small focus of apparent subarachnoid hemorrhage along the right posterior parietal sulcus is no longer seen - Hold aspirin, warfarin   Active Problems: Syncope: Possibly due to vasovagal episode -  I discussed in detail with patient's daughter who reported that patient has a history of vasovagal episodes - Orthostatics were mildly positive, patient was placed on IV fluid hydration.  - Obtain 2-D echo, carotid  Dopplers, PT evaluation      Hypothyroidism - Continue levothyroxin     DM (diabetes mellitus) with complications (HCC) - Continue Lantus, decreased dose, continue sliding scale insulin, follow hemoglobin A1c     Dyslipidemia - Continue Pravachol     HTN (hypertension) - Not on any antihypertensives, BP soft, was borderline orthostatic, place on gentle hydration     CAD (coronary artery disease) of artery bypass graft - Continue statin, hold aspirin, morphine  - Follow up with cardiology outpatient, with [name]    Atrial fibrillation [facility]) - Currently rate controlled, continue digoxin, continue to hold warfarin  - Given history of vasovagal episodes, falls, subarachnoid hemorrhage, I recommended to follow-up with cardiology and discuss risk and benefits whether to continue warfarin at this point   mildly comminuted nasal bone fracture - Outpatient follow-up with ENT   Code Status: full  SCD's Family Communication: Discussed in detail with the patient, all imaging results, lab results explained to the patient and daughter [name]. Daughter is interested if patient can go to a temporary rehabilitation place they have also been looking into assisted living facilities. Patient lives alone but daughter  close by in the same neighborhood. We will await physical therapy evaluation   Disposition Plan: Await PT evaluation   Time Spent in minutes   25 minutes  Procedures:  CT head   Consultants:   Neurosurgery   Antimicrobials   None    Medications  Scheduled Meds: . digoxin  0.0625 mg Oral q1800  . fenofibrate  160 mg Oral Daily  . insulin aspart  0-5 Units Subcutaneous QHS  . insulin aspart  0-9 Units Subcutaneous TID WC  . insulin glargine  30 Units Subcutaneous q1800  . latanoprost  1 drop Both Eyes QHS  . levETIRAcetam  500 mg Oral BID  . levothyroxine  88 mcg Oral QAC breakfast  . metoprolol tartrate  25 mg Oral BID  . pantoprazole  40 mg Oral Daily  .  pravastatin  80 mg Oral q1800  . sodium chloride flush  3 mL Intravenous Q12H  . sodium chloride flush  3 mL Intravenous Q12H   Continuous Infusions: . sodium chloride     PRN Meds:.sodium chloride, morphine injection, nitroGLYCERIN, sodium chloride flush   Antibiotics   Anti-infectives    None        Subjective:   [name] was seen and examined today.  Sitting up in the chair, denies any complaints at this time, no dizziness or lightheadedness at this time. Patient denies dizziness, chest pain, shortness of breath, abdominal pain, N/V/D/C, new weakness, numbess, tingling. No acute events overnight.    Objective:   Vitals:   08/08/16 1831 08/08/16 2006 08/09/16 0038 08/09/16 0656  BP:  (!) 134/48 103/67   Pulse:  70 60 70  Resp:  18 19 18   Temp:  98.1 F (36.7 C) 98.7 F (37.1 C) 98.5 F (36.9 C)  TempSrc:  Oral Oral Oral  SpO2:  99% 98% 98%  Weight: 74.3 kg (163 lb 12.8 oz)   73.8 kg (162 lb 12.8 oz)  Height: 5\' 7"  (1.702 m)       Intake/Output Summary (Last 24 hours) at 08/09/16 0955 Last data filed at 08/09/16 0657  Gross per 24 hour  Intake              240 ml  Output              400 ml  Net             -160 ml     Wt Readings from Last 3 Encounters:  08/09/16 73.8 kg (162 lb 12.8 oz)  06/18/16 77.6 kg (171 lb)  06/17/16 77.6 kg (171 lb)     Exam  General: Alert and oriented x 3, NAD  HEENT:  PERRLA, EOMI, Anicteric Sclera, mucous membranes moist. Multiple facial abrasions and contusions, ecchymosis below the eyes, laceration above left eyebrow   Neck: Supple, no JVD, no masses  Cardiovascular: S1 S2 auscultated, no rubs, murmurs or gallops. Regular rate and rhythm.  Respiratory: Clear to auscultation bilaterally, no wheezing, rales or rhonchi  Gastrointestinal: Soft, nontender, nondistended, + bowel sounds  Ext: no cyanosis clubbing or edema  Neuro: AAOx3, Cr N's II- XII. Strength 5/5 upper and lower extremities bilaterally  Skin: No  rashes  Psych: Normal affect and demeanor, alert and oriented x3    Data Reviewed:  I have personally reviewed following labs and imaging studies  Micro Results No results found for this or any previous visit (from the past 240 hour(s)).  Radiology Reports Dg Wrist Complete Left  Result Date: 08/08/2016 CLINICAL  DATA:  Status post fall today and complains of mid to distal right arm pain. No definite wrist pain. The patient does not were called the particulars of the fall. EXAM: LEFT WRIST - COMPLETE 3+ VIEW COMPARISON:  None in PACs FINDINGS: The bones of the left wrist are subjectively adequately mineralized. The distal radius and ulna are intact. There is mild narrowing of the radiocarpal joint. There is moderate severe narrowing of the first [facility] joint. No acute carpal bone fracture is observed. The metacarpals are intact where visualized. IMPRESSION: There are degenerative changes of the left wrist centered on the radiocarpal joint and first [facility] joint. There is no acute fracture nor dislocation. Electronically Signed   By: David  [name] M.D.   On: 08/08/2016 13:57   Ct Head Wo Contrast  Result Date: 08/09/2016 CLINICAL DATA:  Question of subarachnoid hemorrhage at the right posterior parietal sulcus. Initial encounter. EXAM: CT HEAD WITHOUT CONTRAST TECHNIQUE: Contiguous axial images were obtained from the base of the skull through the vertex without intravenous contrast. COMPARISON:  CT of the head performed 08/08/2016 FINDINGS: Brain: The previously noted small focus of apparent subarachnoid hemorrhage along the right posterior parietal sulcus is no longer seen. Prominence of the ventricles and sulci reflects mild to moderate cortical volume loss. Mild cerebellar atrophy is noted. Scattered periventricular and subcortical white matter change likely reflects small vessel ischemic microangiopathy. Chronic lacunar infarcts are noted at the left basal ganglia. The brainstem and fourth ventricle are  within normal limits. The cerebral hemispheres demonstrate grossly normal gray-white differentiation. No mass effect or midline shift is seen. Vascular: No hyperdense vessel or unexpected calcification. Skull: A mildly comminuted fracture of the nasal bone is again noted, with rightward displacement. Sinuses/Orbits: The visualized portions of the orbits are within normal limits. The paranasal sinuses and mastoid air cells are well-aerated. Other: Mild soft tissue swelling is noted overlying the left frontal calvarium. IMPRESSION: 1. Previously noted small focus of apparent subarachnoid hemorrhage along the right posterior parietal sulcus is no longer seen. 2. Mild soft tissue swelling overlying the left frontal calvarium. 3. Mildly comminuted fracture of the nasal bone, with rightward displacement. 4. Mild to moderate cortical volume loss and scattered small vessel ischemic microangiopathy. Chronic lacunar infarcts at the left basal ganglia. Electronically Signed   By: Garald Balding M.D.   On: 08/09/2016 03:56   Ct Head Wo Contrast  Result Date: 08/08/2016 CLINICAL DATA:  Head and neck pain and facial injuries after fall. EXAM: CT HEAD WITHOUT CONTRAST CT MAXILLOFACIAL WITHOUT CONTRAST CT CERVICAL SPINE WITHOUT CONTRAST TECHNIQUE: Multidetector CT imaging of the head, cervical spine, and maxillofacial structures were performed using the standard protocol without intravenous contrast. Multiplanar CT image reconstructions of the cervical spine and maxillofacial structures were also generated. COMPARISON:  CT scan of May 22, 2007. FINDINGS: CT HEAD FINDINGS Brain: Mild chronic ischemic white matter disease is noted. Mild diffuse cortical atrophy is noted. No mass effect or midline shift is noted. Ventricular size is within normal limits. There is no evidence of mass lesion or acute infarction. Small high density focus is seen in right posterior parietal sulcus concerning for small focus of subarachnoid  hemorrhage. Vascular: No hyperdense vessel or unexpected calcification. Skull: Normal. Negative for fracture or focal lesion. Other: Old lacunar infarctions are noted in both basal ganglia bilaterally. Small left frontal scalp hematoma is noted. CT MAXILLOFACIAL FINDINGS Osseous: Moderately displaced and comminuted nasal bone fracture is noted, which is displaced to the right. Orbits: Negative. No  traumatic or inflammatory finding. Sinuses: Right ethmoid sinusitis is noted. Bilateral maxillary sinusitis is noted. Soft tissues: Negative. CT CERVICAL SPINE FINDINGS Alignment: Normal. Skull base and vertebrae: No acute fracture. No primary bone lesion or focal pathologic process. Soft tissues and spinal canal: No prevertebral fluid or swelling. No visible canal hematoma. Disc levels: Severe degenerative disc disease is noted at C3-4, C4-5, C5-6 and C6-7 with anterior and posterior osteophyte formation. Upper chest: Negative. Other: None. IMPRESSION: Mild chronic ischemic white matter disease. Mild diffuse cortical atrophy. Small left frontal scalp hematoma. Probable small focus of subarachnoid hemorrhage seen in right posterior parietal sulcus. Moderately displaced and comminuted nasal bone fracture is noted. Severe multilevel degenerative disc disease is noted in the cervical spine. No fracture or spondylolisthesis is noted. Critical Value/emergent results were called by telephone at the time of interpretation on 08/08/2016 at 2:12 pm to Dr. Dalia Heading , who verbally acknowledged these results. Electronically Signed   By: Marijo Conception, M.D.   On: 08/08/2016 14:13   Ct Cervical Spine Wo Contrast  Result Date: 08/08/2016 CLINICAL DATA:  Head and neck pain and facial injuries after fall. EXAM: CT HEAD WITHOUT CONTRAST CT MAXILLOFACIAL WITHOUT CONTRAST CT CERVICAL SPINE WITHOUT CONTRAST TECHNIQUE: Multidetector CT imaging of the head, cervical spine, and maxillofacial structures were performed using the  standard protocol without intravenous contrast. Multiplanar CT image reconstructions of the cervical spine and maxillofacial structures were also generated. COMPARISON:  CT scan of May 22, 2007. FINDINGS: CT HEAD FINDINGS Brain: Mild chronic ischemic white matter disease is noted. Mild diffuse cortical atrophy is noted. No mass effect or midline shift is noted. Ventricular size is within normal limits. There is no evidence of mass lesion or acute infarction. Small high density focus is seen in right posterior parietal sulcus concerning for small focus of subarachnoid hemorrhage. Vascular: No hyperdense vessel or unexpected calcification. Skull: Normal. Negative for fracture or focal lesion. Other: Old lacunar infarctions are noted in both basal ganglia bilaterally. Small left frontal scalp hematoma is noted. CT MAXILLOFACIAL FINDINGS Osseous: Moderately displaced and comminuted nasal bone fracture is noted, which is displaced to the right. Orbits: Negative. No traumatic or inflammatory finding. Sinuses: Right ethmoid sinusitis is noted. Bilateral maxillary sinusitis is noted. Soft tissues: Negative. CT CERVICAL SPINE FINDINGS Alignment: Normal. Skull base and vertebrae: No acute fracture. No primary bone lesion or focal pathologic process. Soft tissues and spinal canal: No prevertebral fluid or swelling. No visible canal hematoma. Disc levels: Severe degenerative disc disease is noted at C3-4, C4-5, C5-6 and C6-7 with anterior and posterior osteophyte formation. Upper chest: Negative. Other: None. IMPRESSION: Mild chronic ischemic white matter disease. Mild diffuse cortical atrophy. Small left frontal scalp hematoma. Probable small focus of subarachnoid hemorrhage seen in right posterior parietal sulcus. Moderately displaced and comminuted nasal bone fracture is noted. Severe multilevel degenerative disc disease is noted in the cervical spine. No fracture or spondylolisthesis is noted. Critical Value/emergent  results were called by telephone at the time of interpretation on 08/08/2016 at 2:12 pm to Dr. Dalia Heading , who verbally acknowledged these results. Electronically Signed   By: Marijo Conception, M.D.   On: 08/08/2016 14:13   Dg Humerus Right  Result Date: 08/08/2016 CLINICAL DATA:  Right mid to distal arm pain following a fall today. EXAM: RIGHT HUMERUS - 2+ VIEW COMPARISON:  Right shoulder series of Frank 3, 2014. FINDINGS: The humerus is subjectively adequately mineralized. The observed portions of the shoulder and elbow are intact.  There is narrowing of the subacromial subdeltoid space which appears chronic when compared to the previous study. The soft tissues of the arm are unremarkable. IMPRESSION: There is no acute bony abnormality of the right humerus. Electronically Signed   By: David  [name] M.D.   On: 08/08/2016 14:00   Ct Maxillofacial Wo Cm  Result Date: 08/08/2016 CLINICAL DATA:  Head and neck pain and facial injuries after fall. EXAM: CT HEAD WITHOUT CONTRAST CT MAXILLOFACIAL WITHOUT CONTRAST CT CERVICAL SPINE WITHOUT CONTRAST TECHNIQUE: Multidetector CT imaging of the head, cervical spine, and maxillofacial structures were performed using the standard protocol without intravenous contrast. Multiplanar CT image reconstructions of the cervical spine and maxillofacial structures were also generated. COMPARISON:  CT scan of May 22, 2007. FINDINGS: CT HEAD FINDINGS Brain: Mild chronic ischemic white matter disease is noted. Mild diffuse cortical atrophy is noted. No mass effect or midline shift is noted. Ventricular size is within normal limits. There is no evidence of mass lesion or acute infarction. Small high density focus is seen in right posterior parietal sulcus concerning for small focus of subarachnoid hemorrhage. Vascular: No hyperdense vessel or unexpected calcification. Skull: Normal. Negative for fracture or focal lesion. Other: Old lacunar infarctions are noted in both  basal ganglia bilaterally. Small left frontal scalp hematoma is noted. CT MAXILLOFACIAL FINDINGS Osseous: Moderately displaced and comminuted nasal bone fracture is noted, which is displaced to the right. Orbits: Negative. No traumatic or inflammatory finding. Sinuses: Right ethmoid sinusitis is noted. Bilateral maxillary sinusitis is noted. Soft tissues: Negative. CT CERVICAL SPINE FINDINGS Alignment: Normal. Skull base and vertebrae: No acute fracture. No primary bone lesion or focal pathologic process. Soft tissues and spinal canal: No prevertebral fluid or swelling. No visible canal hematoma. Disc levels: Severe degenerative disc disease is noted at C3-4, C4-5, C5-6 and C6-7 with anterior and posterior osteophyte formation. Upper chest: Negative. Other: None. IMPRESSION: Mild chronic ischemic white matter disease. Mild diffuse cortical atrophy. Small left frontal scalp hematoma. Probable small focus of subarachnoid hemorrhage seen in right posterior parietal sulcus. Moderately displaced and comminuted nasal bone fracture is noted. Severe multilevel degenerative disc disease is noted in the cervical spine. No fracture or spondylolisthesis is noted. Critical Value/emergent results were called by telephone at the time of interpretation on 08/08/2016 at 2:12 pm to Dr. Dalia Heading , who verbally acknowledged these results. Electronically Signed   By: Marijo Conception, M.D.   On: 08/08/2016 14:13    Lab Data:  CBC:  Recent Labs Lab 08/08/16 1410 08/09/16 0621  WBC 10.8* 11.0*  NEUTROABS 9.3*  --   HGB 13.8 13.1  HCT 41.2 39.9  MCV 95.2 94.8  PLT 150 185*   Basic Metabolic Panel:  Recent Labs Lab 08/08/16 1410 08/09/16 0621  NA 138 137  K 4.6 3.8  CL 105 107  CO2 24 23  GLUCOSE 256* 93  BUN 18 14  CREATININE 1.21 1.09  CALCIUM 8.5* 8.6*   GFR: Estimated Creatinine Clearance: 43.8 mL/min (by C-G formula based on SCr of 1.09 mg/dL). Liver Function Tests: No results for input(s):  AST, ALT, ALKPHOS, BILITOT, PROT, ALBUMIN in the last 168 hours. No results for input(s): LIPASE, AMYLASE in the last 168 hours. No results for input(s): AMMONIA in the last 168 hours. Coagulation Profile:  Recent Labs Lab 08/08/16 1410  INR 2.43   Cardiac Enzymes:  Recent Labs Lab 08/08/16 1847 08/09/16 0000 08/09/16 0621  TROPONINI <0.03 <0.03 <0.03   BNP (last 3 results) No  results for input(s): PROBNP in the last 8760 hours. HbA1C: No results for input(s): HGBA1C in the last 72 hours. CBG:  Recent Labs Lab 08/08/16 1804 08/08/16 2133 08/09/16 0745  GLUCAP 268* 276* 114*   Lipid Profile: No results for input(s): CHOL, HDL, LDLCALC, TRIG, CHOLHDL, LDLDIRECT in the last 72 hours. Thyroid Function Tests: No results for input(s): TSH, T4TOTAL, FREET4, T3FREE, THYROIDAB in the last 72 hours. Anemia Panel: No results for input(s): VITAMINB12, FOLATE, FERRITIN, TIBC, IRON, RETICCTPCT in the last 72 hours. Urine analysis:    Component Value Date/Time   COLORURINE YELLOW 08/08/2016 1547   APPEARANCEUR CLEAR 08/08/2016 1547   LABSPEC 1.021 08/08/2016 1547   PHURINE 6.0 08/08/2016 1547   GLUCOSEU >=500 (A) 08/08/2016 1547   HGBUR NEGATIVE 08/08/2016 1547   BILIRUBINUR NEGATIVE 08/08/2016 1547   KETONESUR 5 (A) 08/08/2016 1547   PROTEINUR NEGATIVE 08/08/2016 1547   UROBILINOGEN 0.2 08/29/2012 1129   NITRITE NEGATIVE 08/08/2016 1547   LEUKOCYTESUR NEGATIVE 08/08/2016 1547     RAI,RIPUDEEP M.D. [facility] 08/09/2016, 9:55 AM  Pager: [phone] Between 7am to 7pm - call Pager - [phone]  After 7pm go to [email or web address] - password [email or web address]  Call night coverage person covering after 7pm

## 2016-08-09 NOTE — Progress Notes (Signed)
*  PRELIMINARY RESULTS* Vascular Ultrasound Carotid Duplex (Doppler) has been completed.  Preliminary findings: Bilateral 1-39% ICA stenosis, antegrade vertebral flow.   Everrett Coombe 08/09/2016, 11:33 AM

## 2016-08-09 NOTE — Care Management Obs Status (Signed)
[place] NOTIFICATION   Patient Details  Name: CARNIE BRUEMMER MRN: [id number] Date of Birth: 1926-01-26   Medicare Observation Status Notification Given:  Yes    Carles Collet, RN 08/09/2016, 2:33 PM

## 2016-08-09 NOTE — Progress Notes (Signed)
  Echocardiogram 2D Echocardiogram has been performed.  Efton Thomley T Megen Madewell 08/09/2016, 1:24 PM

## 2016-08-09 NOTE — Evaluation (Signed)
Physical Therapy Evaluation Patient Details Name: [name] MRN: [id number] DOB: February 09, 1926 Today's Date: 08/09/2016   History of Present Illness  Patient is a 83 year old male with history of atrial fibrillation, CAD status post CABG presented to the ED after a fall and had multiple facial abrasions.  CT of the head showed probable small focus of subarachnoid hemorrhage seen in the right posterior parietal sulcus and moderately displaced and comminuted nasal bone fracture. Patient was admitted overnight. Neurosurgery consult was obtained.  Clinical Impression  Pt admitted with/for fall, facial lacs, nasal bone fx and R UE strain.  Pt is at a mod assist level for bed mobility and min guard for OOB mobility/gait.  Pt will likely need significant assist for ADL/IADLs.  Pt currently limited functionally due to the problems listed. ( See problems list.)   Pt will benefit from PT to maximize function and safety in order to get ready for next venue listed below.     Follow Up Recommendations SNF;Supervision - Intermittent (short-term)    Equipment Recommendations  None recommended by PT    Recommendations for Other Services       Precautions / Restrictions Precautions Precautions: Fall (minimal)      Mobility  Bed Mobility Overal bed mobility: Needs Assistance Bed Mobility: Supine to Sit     Supine to sit: Mod assist     General bed mobility comments: assist needed due to R UE pain/dysfunction  Transfers Overall transfer level: Needs assistance Equipment used: None Transfers: Sit to/from Stand Sit to Stand: Min guard         General transfer comment: used L UE and no assist  Ambulation/Gait Ambulation/Gait assistance: Min guard Ambulation Distance (Feet): 150 Feet Assistive device: None Gait Pattern/deviations: Step-through pattern Gait velocity: slower Gait velocity interpretation: Below normal speed for age/gender General Gait Details: generally steady, but  limited due to R UE pain.  Stairs            Wheelchair Mobility    Modified Rankin (Stroke Patients Only)       Balance Overall balance assessment: Needs assistance   Sitting balance-Leahy Scale: Good       Standing balance-Leahy Scale: Good                               Pertinent Vitals/Pain Pain Assessment: 0-10 Pain Score: 8  Pain Location: right arm in area of humeral head/biceps tendon Pain Descriptors / Indicators: Aching;Constant;Grimacing;Guarding Pain Intervention(s): Patient requesting pain meds-RN notified    Home Living Family/patient expects to be discharged to:: Private residence Living Arrangements: Alone Available Help at Discharge: Available PRN/intermittently;Family Type of Home: House Home Access: Stairs to enter Entrance Stairs-Rails: Psychiatric nurse of Steps: several Home Layout: Two level;Able to live on main level with bedroom/bathroom Home Equipment: Kasandra Knudsen - single point      Prior Function Level of Independence: Independent         Comments: drives, runs errands.     Hand Dominance        Extremity/Trunk Assessment   Upper Extremity Assessment Upper Extremity Assessment: RUE deficits/detail RUE Deficits / Details: pt unable to actively move functionally due to upper arm pain RUE: Unable to fully assess due to pain    Lower Extremity Assessment Lower Extremity Assessment: Overall WFL for tasks assessed       Communication   Communication: No difficulties  Cognition Arousal/Alertness: Awake/alert Behavior During Therapy: [facility] for tasks  assessed/performed Overall Cognitive Status: Within Functional Limits for tasks assessed                                        General Comments      Exercises     Assessment/Plan    PT Assessment Patient needs continued PT services  PT Problem List Decreased strength;Decreased mobility;Decreased knowledge of precautions;Pain        PT Treatment Interventions Gait training;Stair training;Functional mobility training;Therapeutic activities;Patient/family education    PT Goals (Current goals can be found in the Care Plan section)  Acute Rehab PT Goals Patient Stated Goal: get back home as soon as I'm able to be independent PT Goal Formulation: With patient Time For Goal Achievement: 08/16/16 Potential to Achieve Goals: Good    Frequency Min 3X/week   Barriers to discharge Decreased caregiver support      Co-evaluation               End of Session   Activity Tolerance: Patient limited by pain Patient left: in chair;with call bell/phone within reach Nurse Communication: Mobility status PT Visit Diagnosis: Difficulty in walking, not elsewhere classified (R26.2);Pain Pain - Right/Left: Right Pain - part of body: Shoulder;Arm    Time: 8978-4784 PT Time Calculation (min) (ACUTE ONLY): 24 min   Charges:   PT Evaluation $PT Eval Moderate Complexity: 1 Procedure PT Treatments $Gait Training: 8-22 mins   PT G Codes:   PT G-Codes **NOT FOR INPATIENT CLASS** Functional Assessment Tool Used: AM-PAC 6 Clicks Basic Mobility;Clinical judgement Functional Limitation: Mobility: Walking and moving around Mobility: Walking and Moving Around Current Status (X2820): At least 20 percent but less than 40 percent impaired, limited or restricted Mobility: Walking and Moving Around Goal Status [phone]): At least 1 percent but less than 20 percent impaired, limited or restricted    08/09/2016  Donnella Sham, PT [phone]  (pager)  [name] 08/09/2016, 2:09 PM 08/09/2016  Donnella Sham, PT [phone]  (pager)

## 2016-08-09 NOTE — Progress Notes (Signed)
Inpatient Diabetes Program Recommendations  AACE/ADA: New Consensus Statement on Inpatient Glycemic Control (2015)  Target Ranges:  Prepandial:   less than 140 mg/dL      Peak postprandial:   less than 180 mg/dL (1-2 hours)      Critically ill patients:  140 - 180 mg/dL   Lab Results  Component Value Date   GLUCAP 114 (H) 08/09/2016   HGBA1C 9.2 (H) 06/18/2016    Review of Glycemic Control  Diabetes history: DM2 Outpatient Diabetes medications: Lantus 50 units qd + Januvia 100 mg qd Current orders for Inpatient glycemic control: Lantus 50 units qd + Novolog correction 0-15 units tid + 0-5 units hs  Inpatient Diabetes Program Recommendations:  While in the hospital, please consider: -Decrease Lantus by 50% -Decrease Novolog correction to 0-9 units tid + 0-5 units hs  Will plan to speak to patient regarding checking CBGs when appropriate.  Thank you, Nani Gasser. Arelie Kuzel, RN, MSN, CDE Inpatient Glycemic Control Team Team Pager [phone] (8am-5pm) 08/09/2016 8:26 AM

## 2016-08-10 ENCOUNTER — Inpatient Hospital Stay (HOSPITAL_COMMUNITY): Payer: Medicare HMO

## 2016-08-10 LAB — BASIC METABOLIC PANEL
Anion gap: 8 (ref 5–15)
BUN: 21 mg/dL — AB (ref 6–20)
CALCIUM: 8.3 mg/dL — AB (ref 8.9–10.3)
CHLORIDE: 106 mmol/L (ref 101–111)
CO2: 23 mmol/L (ref 22–32)
CREATININE: 1.19 mg/dL (ref 0.61–1.24)
GFR calc non Af Amer: 53 mL/min — ABNORMAL LOW (ref >60)
Glucose, Bld: 176 mg/dL — ABNORMAL HIGH (ref 65–99)
Potassium: 3.9 mmol/L (ref 3.5–5.1)
SODIUM: 137 mmol/L (ref 135–145)

## 2016-08-10 LAB — GLUCOSE, CAPILLARY
GLUCOSE-CAPILLARY: 139 mg/dL — AB (ref 65–99)
GLUCOSE-CAPILLARY: 183 mg/dL — AB (ref 65–99)
GLUCOSE-CAPILLARY: 217 mg/dL — AB (ref 65–99)
Glucose-Capillary: 202 mg/dL — ABNORMAL HIGH (ref 65–99)

## 2016-08-10 LAB — CBC
HCT: 35.1 % — ABNORMAL LOW (ref 39.0–52.0)
Hemoglobin: 11.8 g/dL — ABNORMAL LOW (ref 13.0–17.0)
MCH: 32.2 pg (ref 26.0–34.0)
MCHC: 33.6 g/dL (ref 30.0–36.0)
MCV: 95.9 fL (ref 78.0–100.0)
PLATELETS: 117 10*3/uL — AB (ref 150–400)
RBC: 3.66 MIL/uL — ABNORMAL LOW (ref 4.22–5.81)
RDW: 14.5 % (ref 11.5–15.5)
WBC: 7.7 10*3/uL (ref 4.0–10.5)

## 2016-08-10 LAB — HEMOGLOBIN A1C
HEMOGLOBIN A1C: 8.9 % — AB (ref 4.8–5.6)
Mean Plasma Glucose: 209 mg/dL

## 2016-08-10 NOTE — Progress Notes (Addendum)
Previous notes is for another patient.

## 2016-08-10 NOTE — Progress Notes (Signed)
Orthopedic Tech Progress Note Patient Details:  [name] Aug 22, 1925 [id number]  Ortho Devices Type of Ortho Device: Norval Morton Strapping Ortho Device/Splint Location: Mar Daring Device/Splint Interventions: Application   Hildred Priest 08/10/2016, 12:52 PM

## 2016-08-10 NOTE — Consult Note (Signed)
[name] is an 83 y.o. male.    Chief Complaint: right shoulder pain  HPI: 83 y/o male sustained a ground level fall with possible syncope on 08/08/16. Pt noted to have subarachnoid hemorrhage with facial trauma. c/o worsening right shoulder pain and problems lifting the arm. No prior history of shoulder pain in the past. Denies any numbness or tingling distally, just weakness. INR 2.4 when pt admitted which could cause some bleeding into the shoulder due to the trauma.  PCP:  Kathlene November, MD  PMH: Past Medical History:  Diagnosis Date  . AAA (abdominal aortic aneurysm) ([place])   . Anemia due to chronic blood loss 11/25/2008   Recurrent over the years EGD and colonoscopy x 2 each 2004 and 2010 without cause    . Atrial fibrillation ([place])   . BPH (benign prostatic hyperplasia)    reports aprocedure (TURP) remotely in [place].Marland KitchenNo futher f/u w/ urology  . CAD (coronary artery disease)   . Diabetes mellitus, type 2 ([place])   . Diverticulosis    left colon  . Erosive gastritis   . Fall 08/08/2016   OUTSIDE AT HOME FACIAL TRAUMA   . GERD (gastroesophageal reflux disease)   . Hyperlipidemia   . Hypertension   . Hypothyroidism   . Insomnia    transient  . Osteopenia    dexa 2-11  . Vitamin B12 deficiency     PSH: Past Surgical History:  Procedure Laterality Date  . CARDIAC CATHETERIZATION  01/31/06, 09/25/10, 09-2011  . CATARACT EXTRACTION     right  . CHOLECYSTECTOMY, LAPAROSCOPIC    . COLONOSCOPY     multiple  . CORONARY ARTERY BYPASS GRAFT     1999 stents in 2000  . ESOPHAGOGASTRODUODENOSCOPY     multiple  . GIVENS CAPSULE STUDY N/A 11/04/2012   Procedure: GIVENS CAPSULE STUDY;  Surgeon: Gatha Mayer, MD;  Location: [facility];  Service: Endoscopy;  Laterality: N/A;  . inguinal herniorrhaphies     bilateral  . LEFT HEART CATHETERIZATION WITH CORONARY ANGIOGRAM N/A 10/04/2011   Procedure: LEFT HEART CATHETERIZATION WITH CORONARY ANGIOGRAM;  Surgeon: Burnell Blanks, MD;   Location: [facility] CATH LAB;  Service: Cardiovascular;  Laterality: N/A;  . pacemaker  [place], 2013   medtronic minix [id number]  . PENILE PROSTHESIS IMPLANT  1992  . PERMANENT PACEMAKER GENERATOR CHANGE N/A 06/05/2011   Procedure: PERMANENT PACEMAKER GENERATOR CHANGE;  Surgeon: Evans Lance, MD;  Location: [facility] CATH LAB;  Service: Cardiovascular;  Laterality: N/A;  . PROSTATECTOMY     transurethral  . right hip replacement    . TRANSTHORACIC ECHOCARDIOGRAM  12/2006    Social History:  reports that he has never smoked. He has never used smokeless tobacco. He reports that he drinks about 0.6 - 1.2 oz of alcohol per week . He reports that he does not use drugs.  Allergies:  Allergies  Allergen Reactions  . Ace Inhibitors Cough  . Codeine Phosphate Nausea Only  . Hydrochlorothiazide W-Triamterene Other (See Comments)    CRAMPING    Medications: Current Facility-Administered Medications  Medication Dose Route Frequency Provider Last Rate Last Dose  . 0.9 %  sodium chloride infusion  250 mL Intravenous PRN Velvet Bathe, MD      . 0.9 %  sodium chloride infusion   Intravenous Continuous Ripudeep Krystal Eaton, MD 75 mL/hr at 08/09/16 1005    . acetaminophen (TYLENOL) tablet 1,000 mg  1,000 mg Oral TID Ripudeep Krystal Eaton, MD   1,000 mg  at 08/09/16 2222  . digoxin (LANOXIN) tablet 0.0625 mg  0.0625 mg Oral q1800 Velvet Bathe, MD   0.0625 mg at 08/09/16 1647  . fenofibrate tablet 160 mg  160 mg Oral Daily Velvet Bathe, MD   160 mg at 08/09/16 1005  . HYDROmorphone (DILAUDID) injection 1 mg  1 mg Intravenous Q4H PRN Ripudeep Krystal Eaton, MD   1 mg at 08/09/16 2222  . insulin aspart (novoLOG) injection 0-5 Units  0-5 Units Subcutaneous QHS Velvet Bathe, MD   3 Units at 08/09/16 2251  . insulin aspart (novoLOG) injection 0-9 Units  0-9 Units Subcutaneous TID WC Ripudeep Krystal Eaton, MD   1 Units at 08/10/16 [phone]  . insulin glargine (LANTUS) injection 30 Units  30 Units Subcutaneous q1800 Ripudeep Krystal Eaton, MD   30 Units at  08/09/16 1648  . latanoprost (XALATAN) 0.005 % ophthalmic solution 1 drop  1 drop Both Eyes QHS Velvet Bathe, MD   1 drop at 08/09/16 2223  . levETIRAcetam (KEPPRA) tablet 500 mg  500 mg Oral BID [place] Mink Costella, PA-C   500 mg at 08/09/16 2222  . levothyroxine (SYNTHROID, LEVOTHROID) tablet 88 mcg  88 mcg Oral QAC breakfast Velvet Bathe, MD   88 mcg at 08/10/16 3212  . nitroGLYCERIN (NITROSTAT) SL tablet 0.4 mg  0.4 mg Sublingual Q5 min PRN Velvet Bathe, MD      . pantoprazole (PROTONIX) EC tablet 40 mg  40 mg Oral Daily Velvet Bathe, MD   40 mg at 08/09/16 1005  . pravastatin (PRAVACHOL) tablet 80 mg  80 mg Oral q1800 Velvet Bathe, MD   80 mg at 08/09/16 1646  . sodium chloride flush (NS) 0.9 % injection 3 mL  3 mL Intravenous Q12H Velvet Bathe, MD   3 mL at 08/08/16 2200  . sodium chloride flush (NS) 0.9 % injection 3 mL  3 mL Intravenous Q12H Velvet Bathe, MD   3 mL at 08/09/16 2223  . sodium chloride flush (NS) 0.9 % injection 3 mL  3 mL Intravenous PRN Velvet Bathe, MD      . traMADol Veatrice Bourbon) tablet 50 mg  50 mg Oral Q6H PRN Ripudeep Krystal Eaton, MD   50 mg at 08/10/16 2482    Results for orders placed or performed during the hospital encounter of 08/08/16 (from the past 48 hour(s))  Basic metabolic panel     Status: Abnormal   Collection Time: 08/08/16  2:10 PM  Result Value Ref Range   Sodium 138 135 - 145 mmol/L   Potassium 4.6 3.5 - 5.1 mmol/L   Chloride 105 101 - 111 mmol/L   CO2 24 22 - 32 mmol/L   Glucose, Bld 256 (H) 65 - 99 mg/dL   BUN 18 6 - 20 mg/dL   Creatinine, Ser 1.21 0.61 - 1.24 mg/dL   Calcium 8.5 (L) 8.9 - 10.3 mg/dL   GFR calc non Af Amer 52 (L) >60 mL/min   GFR calc Af Amer 60 (L) >60 mL/min    Comment: (NOTE) The eGFR has been calculated using the CKD EPI equation. This calculation has not been validated in all clinical situations. eGFR's persistently <60 mL/min signify possible Chronic Kidney Disease.    Anion gap 9 5 - 15  CBC with Differential     Status:  Abnormal   Collection Time: 08/08/16  2:10 PM  Result Value Ref Range   WBC 10.8 (H) 4.0 - 10.5 K/uL   RBC 4.33 4.22 - 5.81 MIL/uL  Hemoglobin 13.8 13.0 - 17.0 g/dL   HCT 41.2 39.0 - 52.0 %   MCV 95.2 78.0 - 100.0 fL   MCH 31.9 26.0 - 34.0 pg   MCHC 33.5 30.0 - 36.0 g/dL   RDW 13.9 11.5 - 15.5 %   Platelets 150 150 - 400 K/uL   Neutrophils Relative % 86 %   Neutro Abs 9.3 (H) 1.7 - 7.7 K/uL   Lymphocytes Relative 7 %   Lymphs Abs 0.7 0.7 - 4.0 K/uL   Monocytes Relative 6 %   Monocytes Absolute 0.6 0.1 - 1.0 K/uL   Eosinophils Relative 1 %   Eosinophils Absolute 0.1 0.0 - 0.7 K/uL   Basophils Relative 0 %   Basophils Absolute 0.0 0.0 - 0.1 K/uL  Protime-INR     Status: Abnormal   Collection Time: 08/08/16  2:10 PM  Result Value Ref Range   Prothrombin Time 26.9 (H) 11.4 - 15.2 seconds   INR 2.43   Urinalysis, Routine w reflex microscopic     Status: Abnormal   Collection Time: 08/08/16  3:47 PM  Result Value Ref Range   Color, Urine YELLOW YELLOW   APPearance CLEAR CLEAR   Specific Gravity, Urine 1.021 1.005 - 1.030   pH 6.0 5.0 - 8.0   Glucose, UA >=500 (A) NEGATIVE mg/dL   Hgb urine dipstick NEGATIVE NEGATIVE   Bilirubin Urine NEGATIVE NEGATIVE   Ketones, ur 5 (A) NEGATIVE mg/dL   Protein, ur NEGATIVE NEGATIVE mg/dL   Nitrite NEGATIVE NEGATIVE   Leukocytes, UA NEGATIVE NEGATIVE   RBC / HPF 0-5 0 - 5 RBC/hpf   WBC, UA 0-5 0 - 5 WBC/hpf   Bacteria, UA NONE SEEN NONE SEEN   Squamous Epithelial / LPF 0-5 (A) NONE SEEN   Mucous PRESENT   Glucose, capillary     Status: Abnormal   Collection Time: 08/08/16  6:04 PM  Result Value Ref Range   Glucose-Capillary 268 (H) 65 - 99 mg/dL  Troponin I (q 6hr x 3)     Status: None   Collection Time: 08/08/16  6:47 PM  Result Value Ref Range   Troponin I <0.03 <0.03 ng/mL  Glucose, capillary     Status: Abnormal   Collection Time: 08/08/16  9:33 PM  Result Value Ref Range   Glucose-Capillary 276 (H) 65 - 99 mg/dL   Comment 1  Notify RN    Comment 2 Document in Chart   Troponin I (q 6hr x 3)     Status: None   Collection Time: 08/09/16 12:00 AM  Result Value Ref Range   Troponin I <0.03 <0.03 ng/mL  Basic metabolic panel     Status: Abnormal   Collection Time: 08/09/16  6:21 AM  Result Value Ref Range   Sodium 137 135 - 145 mmol/L   Potassium 3.8 3.5 - 5.1 mmol/L   Chloride 107 101 - 111 mmol/L   CO2 23 22 - 32 mmol/L   Glucose, Bld 93 65 - 99 mg/dL   BUN 14 6 - 20 mg/dL   Creatinine, Ser 1.09 0.61 - 1.24 mg/dL   Calcium 8.6 (L) 8.9 - 10.3 mg/dL   GFR calc non Af Amer 59 (L) >60 mL/min   GFR calc Af Amer >60 >60 mL/min    Comment: (NOTE) The eGFR has been calculated using the CKD EPI equation. This calculation has not been validated in all clinical situations. eGFR's persistently <60 mL/min signify possible Chronic Kidney Disease.    Anion gap  7 5 - 15  CBC     Status: Abnormal   Collection Time: 08/09/16  6:21 AM  Result Value Ref Range   WBC 11.0 (H) 4.0 - 10.5 K/uL   RBC 4.21 (L) 4.22 - 5.81 MIL/uL   Hemoglobin 13.1 13.0 - 17.0 g/dL   HCT 39.9 39.0 - 52.0 %   MCV 94.8 78.0 - 100.0 fL   MCH 31.1 26.0 - 34.0 pg   MCHC 32.8 30.0 - 36.0 g/dL   RDW 14.1 11.5 - 15.5 %   Platelets 146 (L) 150 - 400 K/uL  Troponin I (q 6hr x 3)     Status: None   Collection Time: 08/09/16  6:21 AM  Result Value Ref Range   Troponin I <0.03 <0.03 ng/mL  Glucose, capillary     Status: Abnormal   Collection Time: 08/09/16  7:45 AM  Result Value Ref Range   Glucose-Capillary 114 (H) 65 - 99 mg/dL  Hemoglobin A1c     Status: Abnormal   Collection Time: 08/09/16 10:32 AM  Result Value Ref Range   Hgb A1c MFr Bld 8.9 (H) 4.8 - 5.6 %    Comment: (NOTE)         Pre-diabetes: 5.7 - 6.4         Diabetes: >6.4         Glycemic control for adults with diabetes: <7.0    Mean Plasma Glucose 209 mg/dL    Comment: (NOTE) Performed At: [facility] [place], [place] [id number] Lindon Romp MD  [id number]   Glucose, capillary     Status: Abnormal   Collection Time: 08/09/16  1:57 PM  Result Value Ref Range   Glucose-Capillary 241 (H) 65 - 99 mg/dL  Glucose, capillary     Status: Abnormal   Collection Time: 08/09/16  3:42 PM  Result Value Ref Range   Glucose-Capillary 267 (H) 65 - 99 mg/dL   Comment 1 Notify RN    Comment 2 Document in Chart   Glucose, capillary     Status: Abnormal   Collection Time: 08/09/16  9:43 PM  Result Value Ref Range   Glucose-Capillary 260 (H) 65 - 99 mg/dL   Comment 1 Notify RN    Comment 2 Document in Chart   Basic metabolic panel     Status: Abnormal   Collection Time: 08/10/16  2:55 AM  Result Value Ref Range   Sodium 137 135 - 145 mmol/L   Potassium 3.9 3.5 - 5.1 mmol/L   Chloride 106 101 - 111 mmol/L   CO2 23 22 - 32 mmol/L   Glucose, Bld 176 (H) 65 - 99 mg/dL   BUN 21 (H) 6 - 20 mg/dL   Creatinine, Ser 1.19 0.61 - 1.24 mg/dL   Calcium 8.3 (L) 8.9 - 10.3 mg/dL   GFR calc non Af Amer 53 (L) >60 mL/min   GFR calc Af Amer >60 >60 mL/min    Comment: (NOTE) The eGFR has been calculated using the CKD EPI equation. This calculation has not been validated in all clinical situations. eGFR's persistently <60 mL/min signify possible Chronic Kidney Disease.    Anion gap 8 5 - 15  CBC     Status: Abnormal   Collection Time: 08/10/16  2:55 AM  Result Value Ref Range   WBC 7.7 4.0 - 10.5 K/uL   RBC 3.66 (L) 4.22 - 5.81 MIL/uL   Hemoglobin 11.8 (L) 13.0 - 17.0 g/dL   HCT 35.1 (L)  39.0 - 52.0 %   MCV 95.9 78.0 - 100.0 fL   MCH 32.2 26.0 - 34.0 pg   MCHC 33.6 30.0 - 36.0 g/dL   RDW 14.5 11.5 - 15.5 %   Platelets 117 (L) 150 - 400 K/uL    Comment: REPEATED TO VERIFY SPECIMEN CHECKED FOR CLOTS PLATELET COUNT CONFIRMED BY SMEAR   Glucose, capillary     Status: Abnormal   Collection Time: 08/10/16  7:46 AM  Result Value Ref Range   Glucose-Capillary 139 (H) 65 - 99 mg/dL   Comment 1 Notify RN    Dg Wrist Complete Left  Result Date:  08/08/2016 CLINICAL DATA:  Status post fall today and complains of mid to distal right arm pain. No definite wrist pain. The patient does not were called the particulars of the fall. EXAM: LEFT WRIST - COMPLETE 3+ VIEW COMPARISON:  None in PACs FINDINGS: The bones of the left wrist are subjectively adequately mineralized. The distal radius and ulna are intact. There is mild narrowing of the radiocarpal joint. There is moderate severe narrowing of the first [facility] joint. No acute carpal bone fracture is observed. The metacarpals are intact where visualized. IMPRESSION: There are degenerative changes of the left wrist centered on the radiocarpal joint and first CMC joint. There is no acute fracture nor dislocation. Electronically Signed   By: David  [name] M.D.   On: 08/08/2016 13:57   Ct Head Wo Contrast  Result Date: 08/09/2016 CLINICAL DATA:  Question of subarachnoid hemorrhage at the right posterior parietal sulcus. Initial encounter. EXAM: CT HEAD WITHOUT CONTRAST TECHNIQUE: Contiguous axial images were obtained from the base of the skull through the vertex without intravenous contrast. COMPARISON:  CT of the head performed 08/08/2016 FINDINGS: Brain: The previously noted small focus of apparent subarachnoid hemorrhage along the right posterior parietal sulcus is no longer seen. Prominence of the ventricles and sulci reflects mild to moderate cortical volume loss. Mild cerebellar atrophy is noted. Scattered periventricular and subcortical white matter change likely reflects small vessel ischemic microangiopathy. Chronic lacunar infarcts are noted at the left basal ganglia. The brainstem and fourth ventricle are within normal limits. The cerebral hemispheres demonstrate grossly normal gray-white differentiation. No mass effect or midline shift is seen. Vascular: No hyperdense vessel or unexpected calcification. Skull: A mildly comminuted fracture of the nasal bone is again noted, with rightward displacement.  Sinuses/Orbits: The visualized portions of the orbits are within normal limits. The paranasal sinuses and mastoid air cells are well-aerated. Other: Mild soft tissue swelling is noted overlying the left frontal calvarium. IMPRESSION: 1. Previously noted small focus of apparent subarachnoid hemorrhage along the right posterior parietal sulcus is no longer seen. 2. Mild soft tissue swelling overlying the left frontal calvarium. 3. Mildly comminuted fracture of the nasal bone, with rightward displacement. 4. Mild to moderate cortical volume loss and scattered small vessel ischemic microangiopathy. Chronic lacunar infarcts at the left basal ganglia. Electronically Signed   By: Garald Balding M.D.   On: 08/09/2016 03:56   Ct Head Wo Contrast  Result Date: 08/08/2016 CLINICAL DATA:  Head and neck pain and facial injuries after fall. EXAM: CT HEAD WITHOUT CONTRAST CT MAXILLOFACIAL WITHOUT CONTRAST CT CERVICAL SPINE WITHOUT CONTRAST TECHNIQUE: Multidetector CT imaging of the head, cervical spine, and maxillofacial structures were performed using the standard protocol without intravenous contrast. Multiplanar CT image reconstructions of the cervical spine and maxillofacial structures were also generated. COMPARISON:  CT scan of May 22, 2007. FINDINGS: CT HEAD FINDINGS Brain:  Mild chronic ischemic white matter disease is noted. Mild diffuse cortical atrophy is noted. No mass effect or midline shift is noted. Ventricular size is within normal limits. There is no evidence of mass lesion or acute infarction. Small high density focus is seen in right posterior parietal sulcus concerning for small focus of subarachnoid hemorrhage. Vascular: No hyperdense vessel or unexpected calcification. Skull: Normal. Negative for fracture or focal lesion. Other: Old lacunar infarctions are noted in both basal ganglia bilaterally. Small left frontal scalp hematoma is noted. CT MAXILLOFACIAL FINDINGS Osseous: Moderately displaced and  comminuted nasal bone fracture is noted, which is displaced to the right. Orbits: Negative. No traumatic or inflammatory finding. Sinuses: Right ethmoid sinusitis is noted. Bilateral maxillary sinusitis is noted. Soft tissues: Negative. CT CERVICAL SPINE FINDINGS Alignment: Normal. Skull base and vertebrae: No acute fracture. No primary bone lesion or focal pathologic process. Soft tissues and spinal canal: No prevertebral fluid or swelling. No visible canal hematoma. Disc levels: Severe degenerative disc disease is noted at C3-4, C4-5, C5-6 and C6-7 with anterior and posterior osteophyte formation. Upper chest: Negative. Other: None. IMPRESSION: Mild chronic ischemic white matter disease. Mild diffuse cortical atrophy. Small left frontal scalp hematoma. Probable small focus of subarachnoid hemorrhage seen in right posterior parietal sulcus. Moderately displaced and comminuted nasal bone fracture is noted. Severe multilevel degenerative disc disease is noted in the cervical spine. No fracture or spondylolisthesis is noted. Critical Value/emergent results were called by telephone at the time of interpretation on 08/08/2016 at 2:12 pm to Dr. Dalia Heading , who verbally acknowledged these results. Electronically Signed   By: Marijo Conception, M.D.   On: 08/08/2016 14:13   Ct Cervical Spine Wo Contrast  Result Date: 08/08/2016 CLINICAL DATA:  Head and neck pain and facial injuries after fall. EXAM: CT HEAD WITHOUT CONTRAST CT MAXILLOFACIAL WITHOUT CONTRAST CT CERVICAL SPINE WITHOUT CONTRAST TECHNIQUE: Multidetector CT imaging of the head, cervical spine, and maxillofacial structures were performed using the standard protocol without intravenous contrast. Multiplanar CT image reconstructions of the cervical spine and maxillofacial structures were also generated. COMPARISON:  CT scan of May 22, 2007. FINDINGS: CT HEAD FINDINGS Brain: Mild chronic ischemic white matter disease is noted. Mild diffuse cortical  atrophy is noted. No mass effect or midline shift is noted. Ventricular size is within normal limits. There is no evidence of mass lesion or acute infarction. Small high density focus is seen in right posterior parietal sulcus concerning for small focus of subarachnoid hemorrhage. Vascular: No hyperdense vessel or unexpected calcification. Skull: Normal. Negative for fracture or focal lesion. Other: Old lacunar infarctions are noted in both basal ganglia bilaterally. Small left frontal scalp hematoma is noted. CT MAXILLOFACIAL FINDINGS Osseous: Moderately displaced and comminuted nasal bone fracture is noted, which is displaced to the right. Orbits: Negative. No traumatic or inflammatory finding. Sinuses: Right ethmoid sinusitis is noted. Bilateral maxillary sinusitis is noted. Soft tissues: Negative. CT CERVICAL SPINE FINDINGS Alignment: Normal. Skull base and vertebrae: No acute fracture. No primary bone lesion or focal pathologic process. Soft tissues and spinal canal: No prevertebral fluid or swelling. No visible canal hematoma. Disc levels: Severe degenerative disc disease is noted at C3-4, C4-5, C5-6 and C6-7 with anterior and posterior osteophyte formation. Upper chest: Negative. Other: None. IMPRESSION: Mild chronic ischemic white matter disease. Mild diffuse cortical atrophy. Small left frontal scalp hematoma. Probable small focus of subarachnoid hemorrhage seen in right posterior parietal sulcus. Moderately displaced and comminuted nasal bone fracture is noted. Severe multilevel  degenerative disc disease is noted in the cervical spine. No fracture or spondylolisthesis is noted. Critical Value/emergent results were called by telephone at the time of interpretation on 08/08/2016 at 2:12 pm to Dr. Dalia Heading , who verbally acknowledged these results. Electronically Signed   By: Marijo Conception, M.D.   On: 08/08/2016 14:13   Ct Shoulder Right Wo Contrast  Result Date: 08/10/2016 CLINICAL DATA:   Status post fall.  Limited range of motion. EXAM: CT OF THE UPPER RIGHT EXTREMITY WITHOUT CONTRAST TECHNIQUE: Multidetector CT imaging of the upper right extremity was performed according to the standard protocol. COMPARISON:  None. FINDINGS: Bones/Joint/Cartilage No fracture or dislocation. Normal alignment. No joint effusion. Loss of the normal acromion humeral distance as can be seen with a chronic rotator cuff tear. Mild arthropathy of the acromioclavicular joint.  Type I acromion. Partially visualize is cervical spine spondylosis. Ligaments Ligaments are suboptimally evaluated by CT. Muscles and Tendons Mild supraspinatus muscle atrophy. Complete tear of the supraspinatus tendon. Soft tissue No fluid collection or hematoma. No soft tissue mass. Pacemaker power pack in the right anterior chest wall. Visualize right lung is clear. IMPRESSION: 1.  No acute osseous injury of the right shoulder. 2. Complete tear of the supraspinatus tendon with mild supraspinatus muscle atrophy. Electronically Signed   By: Kathreen Devoid   On: 08/10/2016 10:53   Dg Humerus Right  Result Date: 08/08/2016 CLINICAL DATA:  Right mid to distal arm pain following a fall today. EXAM: RIGHT HUMERUS - 2+ VIEW COMPARISON:  Right shoulder series of March 15, 2013. FINDINGS: The humerus is subjectively adequately mineralized. The observed portions of the shoulder and elbow are intact. There is narrowing of the subacromial subdeltoid space which appears chronic when compared to the previous study. The soft tissues of the arm are unremarkable. IMPRESSION: There is no acute bony abnormality of the right humerus. Electronically Signed   By: David  [name] M.D.   On: 08/08/2016 14:00   Ct Maxillofacial Wo Cm  Result Date: 08/08/2016 CLINICAL DATA:  Head and neck pain and facial injuries after fall. EXAM: CT HEAD WITHOUT CONTRAST CT MAXILLOFACIAL WITHOUT CONTRAST CT CERVICAL SPINE WITHOUT CONTRAST TECHNIQUE: Multidetector CT imaging of the  head, cervical spine, and maxillofacial structures were performed using the standard protocol without intravenous contrast. Multiplanar CT image reconstructions of the cervical spine and maxillofacial structures were also generated. COMPARISON:  CT scan of May 22, 2007. FINDINGS: CT HEAD FINDINGS Brain: Mild chronic ischemic white matter disease is noted. Mild diffuse cortical atrophy is noted. No mass effect or midline shift is noted. Ventricular size is within normal limits. There is no evidence of mass lesion or acute infarction. Small high density focus is seen in right posterior parietal sulcus concerning for small focus of subarachnoid hemorrhage. Vascular: No hyperdense vessel or unexpected calcification. Skull: Normal. Negative for fracture or focal lesion. Other: Old lacunar infarctions are noted in both basal ganglia bilaterally. Small left frontal scalp hematoma is noted. CT MAXILLOFACIAL FINDINGS Osseous: Moderately displaced and comminuted nasal bone fracture is noted, which is displaced to the right. Orbits: Negative. No traumatic or inflammatory finding. Sinuses: Right ethmoid sinusitis is noted. Bilateral maxillary sinusitis is noted. Soft tissues: Negative. CT CERVICAL SPINE FINDINGS Alignment: Normal. Skull base and vertebrae: No acute fracture. No primary bone lesion or focal pathologic process. Soft tissues and spinal canal: No prevertebral fluid or swelling. No visible canal hematoma. Disc levels: Severe degenerative disc disease is noted at C3-4, C4-5, C5-6 and C6-7  with anterior and posterior osteophyte formation. Upper chest: Negative. Other: None. IMPRESSION: Mild chronic ischemic white matter disease. Mild diffuse cortical atrophy. Small left frontal scalp hematoma. Probable small focus of subarachnoid hemorrhage seen in right posterior parietal sulcus. Moderately displaced and comminuted nasal bone fracture is noted. Severe multilevel degenerative disc disease is noted in the cervical  spine. No fracture or spondylolisthesis is noted. Critical Value/emergent results were called by telephone at the time of interpretation on 08/08/2016 at 2:12 pm to Dr. Dalia Heading , who verbally acknowledged these results. Electronically Signed   By: Marijo Conception, M.D.   On: 08/08/2016 14:13    ROS: ROS Facial contusions due to recent fall Limited strength and mobility of the right upper extremity   Physical Exam: Alert and appropriate 83 y/o male in no acute distress Healing laceration and multiple bruises to facial area s/p fall Cervical spine with full rom and no tenderness Right shoulder with limited rom due to pain and guarding nv intact distally Strength of ER is extremely limited and painful  No rashes or edema distally in upper extremities Physical Exam   Assessment/Plan Assessment: right shoulder pain secondary to acute supraspinatus tear  Plan: Discussed with pt and family the rotator cuff does not heal itself but he is not a surgical candidate at this point.  Recommend cortisone injection to try and get some relief but need clearance from the medical team before administering Pt and family in agreement Currently a sling would aid with support of the arm  Will monitor his progress

## 2016-08-10 NOTE — Progress Notes (Addendum)
[facility]                                                                              Patient Demographics  [name], is a 83 y.o. male, DOB - 12-03-1925, WNU:[id number]  Admit date - 08/08/2016   Admitting Physician Velvet Bathe, MD  Outpatient Primary MD for the patient is Kathlene November, MD  Outpatient specialists:   LOS - 1  days    Chief Complaint  Patient presents with  . Fall       Brief summary   Patient is a 83 year old male with history of atrial fibrillation, CAD status post CABG presented to the ED after a fall and had multiple facial abrasions. Patient was unaware of why he fell. Patient reported that he was helping his neighbor blow the leaves in the yard and then he may have passed out or tripped. Per his daughter he does have a history of vasovagal episodes. He did not have any bowel or bladder incontinence, no fitness seizure-like activity. CT of the head showed probable small focus of subarachnoid hemorrhage seen in the right posterior parietal sulcus and moderately displaced and comminuted nasal bone fracture. Patient was admitted overnight. Neurosurgery consult was obtained.   Assessment & Plan    Principal Problem:   SAH (subarachnoid hemorrhage) (HCC) - CT head showed Probable small focus of subarachnoid hemorrhage seen in right posterior parietal sulcus. - Neurosurgery was consulted and recommended observation overnight and repeat CT head in 12 hours, Keppra for 7 days - Repeat CT head was done which showed previously noted small focus of apparent subarachnoid hemorrhage along the right posterior parietal sulcus is no longer seen - Hold aspirin, warfarin - Discussed with patient's family (3 daughters in the room) to discuss with patient's cardiologist regarding risks and benefits of anticoagulation given the history of falls, SAH.    Active Problems: Syncope: Possibly due to vasovagal episode -  I discussed in detail with patient's  daughters who reported that patient has a history of vasovagal episodes - Orthostatics were mildly positive, patient was placed on IV fluid hydration.  - carotid Dopplers showed 1-39% stenosis of the right internal carotid artery and the left internal carotid artery - PT evaluation recommended skilled nursing facility - 2-D echo showed EF of 60-65% with grade 2 diastolic dysfunction - will obtain B12, folate, cortisol level in a.m.  RIGHT shoulder pain  - X-ray of the right humerus showed no fracture or dislocation - Patient continues to complain of significant pain and has tenderness, difficulty moving his shoulder with abduction and extension. Cannot obtain MRI due to pacemaker. Possibly has rotator cuff injury due to the fall - CT of the right shoulder and orthopedic consult called, d/w Dr Veverly Fells - Continue pain control Addendum: Reviewed ortho note, patient is medically stable for cortisone injection for the right shoulder  Ct shoulder consistent with Complete tear of the supraspinatus tendon with mild supraspinatus muscle      Hypothyroidism - Continue levothyroxine     DM (diabetes mellitus) with complications ([place]) - Continue Lantus, decreased dose, continue sliding scale insulin, follow hemoglobin A1c  Dyslipidemia - Continue Pravachol     HTN (hypertension) - Not on any antihypertensives, BP soft, was borderline orthostatic, place on gentle hydration     CAD (coronary artery disease) of artery bypass graft - Continue statin, hold aspirin, morphine  - Follow up with cardiology outpatient, with Dr. Lovena Le    Atrial fibrillation [facility]) - Currently rate controlled, continue digoxin, continue to hold warfarin  - Given history of vasovagal episodes, falls, subarachnoid hemorrhage, I recommended to follow-up with cardiology and discuss risk and benefits whether to continue warfarin at this point   mildly comminuted nasal bone fracture - Outpatient follow-up with  ENT   Code Status: full  SCD's Family Communication: Discussed in detail with the patient, all imaging results, lab results explained to the patient and daughters at the bedside  Disposition Plan: Will need skilled nursing facility  Time Spent in minutes   25 minutes  Procedures:  CT head   Consultants:   Neurosurgery Orthopedics   Antimicrobials   None    Medications  Scheduled Meds: . acetaminophen  1,000 mg Oral TID  . digoxin  0.0625 mg Oral q1800  . fenofibrate  160 mg Oral Daily  . insulin aspart  0-5 Units Subcutaneous QHS  . insulin aspart  0-9 Units Subcutaneous TID WC  . insulin glargine  30 Units Subcutaneous q1800  . latanoprost  1 drop Both Eyes QHS  . levETIRAcetam  500 mg Oral BID  . levothyroxine  88 mcg Oral QAC breakfast  . pantoprazole  40 mg Oral Daily  . pravastatin  80 mg Oral q1800  . sodium chloride flush  3 mL Intravenous Q12H  . sodium chloride flush  3 mL Intravenous Q12H   Continuous Infusions: . sodium chloride 75 mL/hr at 08/09/16 1005   PRN Meds:.sodium chloride, HYDROmorphone (DILAUDID) injection, nitroGLYCERIN, sodium chloride flush, traMADol   Antibiotics   Anti-infectives    None        Subjective:   [name] was seen and examined today. Feels miserable with his right shoulder, pain 9 out of 10, with any movement of the shoulder having difficulty eating. Denies any no dizziness or lightheadedness at this time. Patient denies chest pain, shortness of breath, abdominal pain, N/V/D/C, new weakness, numbess, tingling. No acute events overnight.    Objective:   Vitals:   08/09/16 2148 08/10/16 0023 08/10/16 0600 08/10/16 0603  BP: (!) 133/51 (!) 120/49    Pulse: (!) 57 60 (!) 59   Resp: 18 16    Temp: 98 F (36.7 C) 98.4 F (36.9 C) 97.8 F (36.6 C)   TempSrc: Oral Oral Oral   SpO2: 99% 97%    Weight:    76 kg (167 lb 9.6 oz)  Height:        Intake/Output Summary (Last 24 hours) at 08/10/16 0958 Last data  filed at 08/10/16 0737  Gross per 24 hour  Intake          1676.75 ml  Output              250 ml  Net          1426.75 ml     Wt Readings from Last 3 Encounters:  08/10/16 76 kg (167 lb 9.6 oz)  06/18/16 77.6 kg (171 lb)  06/17/16 77.6 kg (171 lb)     Exam  General: Alert and oriented x 3, NAD  HEENT:  Multiple facial abrasions and contusions, ecchymosis below the eyes, laceration above left eyebrow  Neck: Supple, no JVD, no masses  Cardiovascular: S1 S2 auscultated, no rubs, murmurs or gallops. Regular rate and rhythm.  Respiratory: Clear to auscultation bilaterally, no wheezing, rales or rhonchi  Gastrointestinal: Soft, nontender, nondistended, + bowel sounds  Ext: no cyanosis clubbing or edema. ROM decreased with abduction and extension with right shoulder  Neuro:No new deficits   Skin: facial abrasions and ecchymosis   Psych: Normal affect and demeanor, alert and oriented x3    Data Reviewed:  I have personally reviewed following labs and imaging studies  Micro Results No results found for this or any previous visit (from the past 240 hour(s)).  Radiology Reports Dg Wrist Complete Left  Result Date: 08/08/2016 CLINICAL DATA:  Status post fall today and complains of mid to distal right arm pain. No definite wrist pain. The patient does not were called the particulars of the fall. EXAM: LEFT WRIST - COMPLETE 3+ VIEW COMPARISON:  None in PACs FINDINGS: The bones of the left wrist are subjectively adequately mineralized. The distal radius and ulna are intact. There is mild narrowing of the radiocarpal joint. There is moderate severe narrowing of the first [facility] joint. No acute carpal bone fracture is observed. The metacarpals are intact where visualized. IMPRESSION: There are degenerative changes of the left wrist centered on the radiocarpal joint and first [facility] joint. There is no acute fracture nor dislocation. Electronically Signed   By: David  [name] M.D.   On:  08/08/2016 13:57   Ct Head Wo Contrast  Result Date: 08/09/2016 CLINICAL DATA:  Question of subarachnoid hemorrhage at the right posterior parietal sulcus. Initial encounter. EXAM: CT HEAD WITHOUT CONTRAST TECHNIQUE: Contiguous axial images were obtained from the base of the skull through the vertex without intravenous contrast. COMPARISON:  CT of the head performed 08/08/2016 FINDINGS: Brain: The previously noted small focus of apparent subarachnoid hemorrhage along the right posterior parietal sulcus is no longer seen. Prominence of the ventricles and sulci reflects mild to moderate cortical volume loss. Mild cerebellar atrophy is noted. Scattered periventricular and subcortical white matter change likely reflects small vessel ischemic microangiopathy. Chronic lacunar infarcts are noted at the left basal ganglia. The brainstem and fourth ventricle are within normal limits. The cerebral hemispheres demonstrate grossly normal gray-white differentiation. No mass effect or midline shift is seen. Vascular: No hyperdense vessel or unexpected calcification. Skull: A mildly comminuted fracture of the nasal bone is again noted, with rightward displacement. Sinuses/Orbits: The visualized portions of the orbits are within normal limits. The paranasal sinuses and mastoid air cells are well-aerated. Other: Mild soft tissue swelling is noted overlying the left frontal calvarium. IMPRESSION: 1. Previously noted small focus of apparent subarachnoid hemorrhage along the right posterior parietal sulcus is no longer seen. 2. Mild soft tissue swelling overlying the left frontal calvarium. 3. Mildly comminuted fracture of the nasal bone, with rightward displacement. 4. Mild to moderate cortical volume loss and scattered small vessel ischemic microangiopathy. Chronic lacunar infarcts at the left basal ganglia. Electronically Signed   By: Garald Balding M.D.   On: 08/09/2016 03:56   Ct Head Wo Contrast  Result Date:  08/08/2016 CLINICAL DATA:  Head and neck pain and facial injuries after fall. EXAM: CT HEAD WITHOUT CONTRAST CT MAXILLOFACIAL WITHOUT CONTRAST CT CERVICAL SPINE WITHOUT CONTRAST TECHNIQUE: Multidetector CT imaging of the head, cervical spine, and maxillofacial structures were performed using the standard protocol without intravenous contrast. Multiplanar CT image reconstructions of the cervical spine and maxillofacial structures were also generated. COMPARISON:  CT  scan of May 22, 2007. FINDINGS: CT HEAD FINDINGS Brain: Mild chronic ischemic white matter disease is noted. Mild diffuse cortical atrophy is noted. No mass effect or midline shift is noted. Ventricular size is within normal limits. There is no evidence of mass lesion or acute infarction. Small high density focus is seen in right posterior parietal sulcus concerning for small focus of subarachnoid hemorrhage. Vascular: No hyperdense vessel or unexpected calcification. Skull: Normal. Negative for fracture or focal lesion. Other: Old lacunar infarctions are noted in both basal ganglia bilaterally. Small left frontal scalp hematoma is noted. CT MAXILLOFACIAL FINDINGS Osseous: Moderately displaced and comminuted nasal bone fracture is noted, which is displaced to the right. Orbits: Negative. No traumatic or inflammatory finding. Sinuses: Right ethmoid sinusitis is noted. Bilateral maxillary sinusitis is noted. Soft tissues: Negative. CT CERVICAL SPINE FINDINGS Alignment: Normal. Skull base and vertebrae: No acute fracture. No primary bone lesion or focal pathologic process. Soft tissues and spinal canal: No prevertebral fluid or swelling. No visible canal hematoma. Disc levels: Severe degenerative disc disease is noted at C3-4, C4-5, C5-6 and C6-7 with anterior and posterior osteophyte formation. Upper chest: Negative. Other: None. IMPRESSION: Mild chronic ischemic white matter disease. Mild diffuse cortical atrophy. Small left frontal scalp hematoma.  Probable small focus of subarachnoid hemorrhage seen in right posterior parietal sulcus. Moderately displaced and comminuted nasal bone fracture is noted. Severe multilevel degenerative disc disease is noted in the cervical spine. No fracture or spondylolisthesis is noted. Critical Value/emergent results were called by telephone at the time of interpretation on 08/08/2016 at 2:12 pm to Dr. Dalia Heading , who verbally acknowledged these results. Electronically Signed   By: Marijo Conception, M.D.   On: 08/08/2016 14:13   Ct Cervical Spine Wo Contrast  Result Date: 08/08/2016 CLINICAL DATA:  Head and neck pain and facial injuries after fall. EXAM: CT HEAD WITHOUT CONTRAST CT MAXILLOFACIAL WITHOUT CONTRAST CT CERVICAL SPINE WITHOUT CONTRAST TECHNIQUE: Multidetector CT imaging of the head, cervical spine, and maxillofacial structures were performed using the standard protocol without intravenous contrast. Multiplanar CT image reconstructions of the cervical spine and maxillofacial structures were also generated. COMPARISON:  CT scan of May 22, 2007. FINDINGS: CT HEAD FINDINGS Brain: Mild chronic ischemic white matter disease is noted. Mild diffuse cortical atrophy is noted. No mass effect or midline shift is noted. Ventricular size is within normal limits. There is no evidence of mass lesion or acute infarction. Small high density focus is seen in right posterior parietal sulcus concerning for small focus of subarachnoid hemorrhage. Vascular: No hyperdense vessel or unexpected calcification. Skull: Normal. Negative for fracture or focal lesion. Other: Old lacunar infarctions are noted in both basal ganglia bilaterally. Small left frontal scalp hematoma is noted. CT MAXILLOFACIAL FINDINGS Osseous: Moderately displaced and comminuted nasal bone fracture is noted, which is displaced to the right. Orbits: Negative. No traumatic or inflammatory finding. Sinuses: Right ethmoid sinusitis is noted. Bilateral maxillary  sinusitis is noted. Soft tissues: Negative. CT CERVICAL SPINE FINDINGS Alignment: Normal. Skull base and vertebrae: No acute fracture. No primary bone lesion or focal pathologic process. Soft tissues and spinal canal: No prevertebral fluid or swelling. No visible canal hematoma. Disc levels: Severe degenerative disc disease is noted at C3-4, C4-5, C5-6 and C6-7 with anterior and posterior osteophyte formation. Upper chest: Negative. Other: None. IMPRESSION: Mild chronic ischemic white matter disease. Mild diffuse cortical atrophy. Small left frontal scalp hematoma. Probable small focus of subarachnoid hemorrhage seen in right posterior parietal sulcus. Moderately  displaced and comminuted nasal bone fracture is noted. Severe multilevel degenerative disc disease is noted in the cervical spine. No fracture or spondylolisthesis is noted. Critical Value/emergent results were called by telephone at the time of interpretation on 08/08/2016 at 2:12 pm to Dr. Dalia Heading , who verbally acknowledged these results. Electronically Signed   By: Marijo Conception, M.D.   On: 08/08/2016 14:13   Dg Humerus Right  Result Date: 08/08/2016 CLINICAL DATA:  Right mid to distal arm pain following a fall today. EXAM: RIGHT HUMERUS - 2+ VIEW COMPARISON:  Right shoulder series of March 15, 2013. FINDINGS: The humerus is subjectively adequately mineralized. The observed portions of the shoulder and elbow are intact. There is narrowing of the subacromial subdeltoid space which appears chronic when compared to the previous study. The soft tissues of the arm are unremarkable. IMPRESSION: There is no acute bony abnormality of the right humerus. Electronically Signed   By: David  [name] M.D.   On: 08/08/2016 14:00   Ct Maxillofacial Wo Cm  Result Date: 08/08/2016 CLINICAL DATA:  Head and neck pain and facial injuries after fall. EXAM: CT HEAD WITHOUT CONTRAST CT MAXILLOFACIAL WITHOUT CONTRAST CT CERVICAL SPINE WITHOUT CONTRAST  TECHNIQUE: Multidetector CT imaging of the head, cervical spine, and maxillofacial structures were performed using the standard protocol without intravenous contrast. Multiplanar CT image reconstructions of the cervical spine and maxillofacial structures were also generated. COMPARISON:  CT scan of May 22, 2007. FINDINGS: CT HEAD FINDINGS Brain: Mild chronic ischemic white matter disease is noted. Mild diffuse cortical atrophy is noted. No mass effect or midline shift is noted. Ventricular size is within normal limits. There is no evidence of mass lesion or acute infarction. Small high density focus is seen in right posterior parietal sulcus concerning for small focus of subarachnoid hemorrhage. Vascular: No hyperdense vessel or unexpected calcification. Skull: Normal. Negative for fracture or focal lesion. Other: Old lacunar infarctions are noted in both basal ganglia bilaterally. Small left frontal scalp hematoma is noted. CT MAXILLOFACIAL FINDINGS Osseous: Moderately displaced and comminuted nasal bone fracture is noted, which is displaced to the right. Orbits: Negative. No traumatic or inflammatory finding. Sinuses: Right ethmoid sinusitis is noted. Bilateral maxillary sinusitis is noted. Soft tissues: Negative. CT CERVICAL SPINE FINDINGS Alignment: Normal. Skull base and vertebrae: No acute fracture. No primary bone lesion or focal pathologic process. Soft tissues and spinal canal: No prevertebral fluid or swelling. No visible canal hematoma. Disc levels: Severe degenerative disc disease is noted at C3-4, C4-5, C5-6 and C6-7 with anterior and posterior osteophyte formation. Upper chest: Negative. Other: None. IMPRESSION: Mild chronic ischemic white matter disease. Mild diffuse cortical atrophy. Small left frontal scalp hematoma. Probable small focus of subarachnoid hemorrhage seen in right posterior parietal sulcus. Moderately displaced and comminuted nasal bone fracture is noted. Severe multilevel  degenerative disc disease is noted in the cervical spine. No fracture or spondylolisthesis is noted. Critical Value/emergent results were called by telephone at the time of interpretation on 08/08/2016 at 2:12 pm to Dr. Dalia Heading , who verbally acknowledged these results. Electronically Signed   By: Marijo Conception, M.D.   On: 08/08/2016 14:13    Lab Data:  CBC:  Recent Labs Lab 08/08/16 1410 08/09/16 0621 08/10/16 0255  WBC 10.8* 11.0* 7.7  NEUTROABS 9.3*  --   --   HGB 13.8 13.1 11.8*  HCT 41.2 39.9 35.1*  MCV 95.2 94.8 95.9  PLT 150 146* 096*   Basic Metabolic Panel:  Recent  Labs Lab 08/08/16 1410 08/09/16 0621 08/10/16 0255  NA 138 137 137  K 4.6 3.8 3.9  CL 105 107 106  CO2 24 23 23   GLUCOSE 256* 93 176*  BUN 18 14 21*  CREATININE 1.21 1.09 1.19  CALCIUM 8.5* 8.6* 8.3*   GFR: Estimated Creatinine Clearance: 40.1 mL/min (by C-G formula based on SCr of 1.19 mg/dL). Liver Function Tests: No results for input(s): AST, ALT, ALKPHOS, BILITOT, PROT, ALBUMIN in the last 168 hours. No results for input(s): LIPASE, AMYLASE in the last 168 hours. No results for input(s): AMMONIA in the last 168 hours. Coagulation Profile:  Recent Labs Lab 08/08/16 1410  INR 2.43   Cardiac Enzymes:  Recent Labs Lab 08/08/16 1847 08/09/16 0000 08/09/16 0621  TROPONINI <0.03 <0.03 <0.03   BNP (last 3 results) No results for input(s): PROBNP in the last 8760 hours. HbA1C:  Recent Labs  08/09/16 1032  HGBA1C 8.9*   CBG:  Recent Labs Lab 08/09/16 0745 08/09/16 1357 08/09/16 1542 08/09/16 2143 08/10/16 0746  GLUCAP 114* 241* 267* 260* 139*   Lipid Profile: No results for input(s): CHOL, HDL, LDLCALC, TRIG, CHOLHDL, LDLDIRECT in the last 72 hours. Thyroid Function Tests: No results for input(s): TSH, T4TOTAL, FREET4, T3FREE, THYROIDAB in the last 72 hours. Anemia Panel: No results for input(s): VITAMINB12, FOLATE, FERRITIN, TIBC, IRON, RETICCTPCT in the last  72 hours. Urine analysis:    Component Value Date/Time   COLORURINE YELLOW 08/08/2016 1547   APPEARANCEUR CLEAR 08/08/2016 1547   LABSPEC 1.021 08/08/2016 1547   PHURINE 6.0 08/08/2016 1547   GLUCOSEU >=500 (A) 08/08/2016 1547   HGBUR NEGATIVE 08/08/2016 1547   BILIRUBINUR NEGATIVE 08/08/2016 1547   KETONESUR 5 (A) 08/08/2016 1547   PROTEINUR NEGATIVE 08/08/2016 1547   UROBILINOGEN 0.2 08/29/2012 1129   NITRITE NEGATIVE 08/08/2016 1547   LEUKOCYTESUR NEGATIVE 08/08/2016 1547     Dayrin Stallone M.D. [facility] 08/10/2016, 9:58 AM  Pager: [phone] Between 7am to 7pm - call Pager - [phone]  After 7pm go to [email or web address] - password [email or web address]  Call night coverage person covering after 7pm

## 2016-08-11 LAB — GLUCOSE, CAPILLARY
GLUCOSE-CAPILLARY: 241 mg/dL — AB (ref 65–99)
GLUCOSE-CAPILLARY: 203 mg/dL — AB (ref 65–99)
GLUCOSE-CAPILLARY: 242 mg/dL — AB (ref 65–99)
GLUCOSE-CAPILLARY: 188 mg/dL — AB (ref 65–99)
Glucose-Capillary: 174 mg/dL — ABNORMAL HIGH (ref 65–99)
Glucose-Capillary: 238 mg/dL — ABNORMAL HIGH (ref 65–99)

## 2016-08-11 LAB — BASIC METABOLIC PANEL
ANION GAP: 7 (ref 5–15)
BUN: 15 mg/dL (ref 4–21)
BUN: 15 mg/dL (ref 6–20)
CO2: 22 mmol/L (ref 22–32)
CREATININE: 1.1 mg/dL (ref 0.6–1.3)
Calcium: 8.1 mg/dL — ABNORMAL LOW (ref 8.9–10.3)
Chloride: 106 mmol/L (ref 101–111)
Creatinine, Ser: 1.11 mg/dL (ref 0.61–1.24)
GFR calc Af Amer: >60 mL/min (ref >60)
GFR calc non Af Amer: 57 mL/min — ABNORMAL LOW (ref >60)
GLUCOSE: 190 mg/dL
GLUCOSE: 190 mg/dL — AB (ref 65–99)
POTASSIUM: 3.9 mmol/L (ref 3.4–5.3)
POTASSIUM: 3.9 mmol/L (ref 3.5–5.1)
SODIUM: 135 mmol/L — AB (ref 137–147)
Sodium: 135 mmol/L (ref 135–145)

## 2016-08-11 LAB — CBC
HEMATOCRIT: 35.2 % — AB (ref 39.0–52.0)
HEMOGLOBIN: 11.6 g/dL — AB (ref 13.0–17.0)
MCH: 31.6 pg (ref 26.0–34.0)
MCHC: 33 g/dL (ref 30.0–36.0)
MCV: 95.9 fL (ref 78.0–100.0)
Platelets: 124 10*3/uL — ABNORMAL LOW (ref 150–400)
RBC: 3.67 MIL/uL — ABNORMAL LOW (ref 4.22–5.81)
RDW: 14.5 % (ref 11.5–15.5)
WBC: 7.4 10*3/uL (ref 4.0–10.5)

## 2016-08-11 LAB — CBC AND DIFFERENTIAL
HCT: 35 % — AB (ref 41–53)
HEMOGLOBIN: 11.6 g/dL — AB (ref 13.5–17.5)
Platelets: 124 10*3/uL — AB (ref 150–399)
WBC: 7.4 10^3/mL

## 2016-08-11 LAB — VITAMIN B12: Vitamin B-12: 489 pg/mL (ref 180–914)

## 2016-08-11 LAB — FOLATE: Folate: 13.8 ng/mL (ref >5.9)

## 2016-08-11 LAB — CORTISOL-AM, BLOOD: Cortisol - AM: 7 ug/dL (ref 6.7–22.6)

## 2016-08-11 MED ORDER — INSULIN ASPART 100 UNIT/ML ~~LOC~~ SOLN
5.0000 [IU] | Freq: Three times a day (TID) | SUBCUTANEOUS | Status: DC
  Administered 2016-08-11 – 2016-08-13 (×6): 5 [IU] via SUBCUTANEOUS

## 2016-08-11 MED ORDER — INSULIN GLARGINE 100 UNIT/ML ~~LOC~~ SOLN
40.0000 [IU] | Freq: Every day | SUBCUTANEOUS | Status: DC
  Administered 2016-08-11 – 2016-08-12 (×2): 40 [IU] via SUBCUTANEOUS
  Filled 2016-08-11 (×3): qty 0.4

## 2016-08-11 NOTE — Progress Notes (Signed)
[facility]                                                                              Patient Demographics  [name], is a 83 y.o. male, DOB - [birth year], NFA:[id number]  Admit date - 08/08/2016   Admitting Physician Velvet Bathe, MD  Outpatient Primary MD for the patient is Kathlene November, MD  Outpatient specialists:   LOS - 2  days    Chief Complaint  Patient presents with  . Fall       Brief summary   Patient is a 83 year old male with history of atrial fibrillation, CAD status post CABG presented to the ED after a fall and had multiple facial abrasions. Patient was unaware of why he fell. Patient reported that he was helping his neighbor blow the leaves in the yard and then he may have passed out or tripped. Per his daughter he does have a history of vasovagal episodes. He did not have any bowel or bladder incontinence, no fitness seizure-like activity. CT of the head showed probable small focus of subarachnoid hemorrhage seen in the right posterior parietal sulcus and moderately displaced and comminuted nasal bone fracture. Patient was admitted overnight. Neurosurgery consult was obtained.   Assessment & Plan    Principal Problem:   SAH (subarachnoid hemorrhage) (HCC) - CT head showed Probable small focus of subarachnoid hemorrhage seen in right posterior parietal sulcus. - Neurosurgery was consulted and recommended observation overnight and repeat CT head in 12 hours, Keppra for 7 days - Repeat CT head was done which showed previously noted small focus of apparent subarachnoid hemorrhage along the right posterior parietal sulcus is no longer seen - Hold aspirin, warfarin - Discussed with patient's family (3 daughters in the room) to discuss with patient's cardiologist regarding risks and benefits of anticoagulation given the history of falls, SAH.    Active Problems: Syncope: Possibly due to vasovagal episode -  I discussed in detail with patient's  daughters who reported that patient has a history of vasovagal episodes - Orthostatics were mildly positive, patient was placed on IV fluid hydration.  - carotid Dopplers showed 1-39% stenosis of the right internal carotid artery and the left internal carotid artery - PT evaluation recommended skilled nursing facility - 2-D echo showed EF of 60-65% with grade 2 diastolic dysfunction - I-69 folate cortisol within normal limits   RIGHT shoulder pain secondary to rotator cuff tear - X-ray of the right humerus showed no fracture or dislocation -  Continue pain control, sling for comfort - Reviewed ortho note, patient is medically stable for cortisone injection for the right shoulder  - CT shoulder consistent with Complete tear of the supraspinatus tendon with mild supraspinatus muscle      Hypothyroidism - Continue levothyroxine     DM (diabetes mellitus) with complications (HCC) - Elevated CBGs with hyperglycemia, increase Lantus to 40 units  (takes 50 units at home daily ), add meal coverage 5 units, continue sliding scale insulin  - Hemoglobin A1c 8.9    Dyslipidemia - Continue Pravachol     HTN (hypertension) - Not on any antihypertensives, BP stable  CAD (coronary artery disease) of artery bypass graft - Continue statin, hold aspirin,warfarin - Follow up with cardiology outpatient, with Dr. Lovena Le    Atrial fibrillation [facility]) - Currently rate controlled, continue digoxin, continue to hold warfarin  - Given history of vasovagal episodes, falls, subarachnoid hemorrhage, I recommended to follow-up with cardiology and discuss risk and benefits whether to continue warfarin at this point   mildly comminuted nasal bone fracture - Outpatient follow-up with ENT   Code Status: full  SCD's Family Communication: Discussed in detail with the patient, all imaging results, lab results explained to the patient . Discussed with patient's daughters yesterday  Disposition Plan: Will need  skilled nursing facility, likely in a.m.   Time Spent in minutes   25 minutes  Procedures:  CT head   Consultants:   Neurosurgery Orthopedics   Antimicrobials   None    Medications  Scheduled Meds: . acetaminophen  1,000 mg Oral TID  . digoxin  0.0625 mg Oral q1800  . fenofibrate  160 mg Oral Daily  . insulin aspart  0-5 Units Subcutaneous QHS  . insulin aspart  0-9 Units Subcutaneous TID WC  . insulin glargine  30 Units Subcutaneous q1800  . latanoprost  1 drop Both Eyes QHS  . levETIRAcetam  500 mg Oral BID  . levothyroxine  88 mcg Oral QAC breakfast  . pantoprazole  40 mg Oral Daily  . pravastatin  80 mg Oral q1800  . sodium chloride flush  3 mL Intravenous Q12H  . sodium chloride flush  3 mL Intravenous Q12H   Continuous Infusions: . sodium chloride 75 mL/hr at 08/10/16 2136   PRN Meds:.sodium chloride, HYDROmorphone (DILAUDID) injection, nitroGLYCERIN, sodium chloride flush, traMADol   Antibiotics   Anti-infectives    None        Subjective:   [name] was seen and examined today.Feels a lot better today with right shoulder sling. No dizziness or lightheadedness. No chest pain. Patient denies chest pain, shortness of breath, abdominal pain, N/V/D/C, new weakness, numbess, tingling. No acute events overnight.    Objective:   Vitals:   08/10/16 1231 08/10/16 1832 08/10/16 2019 08/11/16 0511  BP: 128/62  (!) 131/55 138/79  Pulse: 62 70 66 66  Resp: 18  18 18   Temp: 98 F (36.7 C)  97.8 F (36.6 C) 98.1 F (36.7 C)  TempSrc: Oral  Oral Oral  SpO2: 99%  95%   Weight:    77.3 kg (170 lb 8 oz)  Height:        Intake/Output Summary (Last 24 hours) at 08/11/16 1147 Last data filed at 08/11/16 0700  Gross per 24 hour  Intake             2285 ml  Output              700 ml  Net             1585 ml     Wt Readings from Last 3 Encounters:  08/11/16 77.3 kg (170 lb 8 oz)  06/18/16 77.6 kg (171 lb)  06/17/16 77.6 kg (171 lb)      Exam  General: Alert and oriented x 3, NAD  HEENT:  Multiple facial abrasions and contusions, ecchymosis below the eyes, laceration above left eyebrow   Neck: Supple, no JVD,   Cardiovascular: S1 S2 clear, RRR  Respiratory: CTAB  Gastrointestinal: Soft, nontender, nondistended, + bowel sounds  Ext: no cyanosis clubbing or edema. right shoulder in sling  Neuro : No new deficits   Skin: facial abrasions and ecchymosis   Psych: Normal affect and demeanor, alert and oriented x3    Data Reviewed:  I have personally reviewed following labs and imaging studies  Micro Results No results found for this or any previous visit (from the past 240 hour(s)).  Radiology Reports Dg Wrist Complete Left  Result Date: 08/08/2016 CLINICAL DATA:  Status post fall today and complains of mid to distal right arm pain. No definite wrist pain. The patient does not were called the particulars of the fall. EXAM: LEFT WRIST - COMPLETE 3+ VIEW COMPARISON:  None in PACs FINDINGS: The bones of the left wrist are subjectively adequately mineralized. The distal radius and ulna are intact. There is mild narrowing of the radiocarpal joint. There is moderate severe narrowing of the first [facility] joint. No acute carpal bone fracture is observed. The metacarpals are intact where visualized. IMPRESSION: There are degenerative changes of the left wrist centered on the radiocarpal joint and first [facility]MC joint. There is no acute fracture nor dislocation. Electronically Signed   By: David  [name] M.D.   On: 08/08/2016 13:57   Ct Head Wo Contrast  Result Date: 08/09/2016 CLINICAL DATA:  Question of subarachnoid hemorrhage at the right posterior parietal sulcus. Initial encounter. EXAM: CT HEAD WITHOUT CONTRAST TECHNIQUE: Contiguous axial images were obtained from the base of the skull through the vertex without intravenous contrast. COMPARISON:  CT of the head performed 08/08/2016 FINDINGS: Brain: The previously noted small  focus of apparent subarachnoid hemorrhage along the right posterior parietal sulcus is no longer seen. Prominence of the ventricles and sulci reflects mild to moderate cortical volume loss. Mild cerebellar atrophy is noted. Scattered periventricular and subcortical white matter change likely reflects small vessel ischemic microangiopathy. Chronic lacunar infarcts are noted at the left basal ganglia. The brainstem and fourth ventricle are within normal limits. The cerebral hemispheres demonstrate grossly normal gray-white differentiation. No mass effect or midline shift is seen. Vascular: No hyperdense vessel or unexpected calcification. Skull: A mildly comminuted fracture of the nasal bone is again noted, with rightward displacement. Sinuses/Orbits: The visualized portions of the orbits are within normal limits. The paranasal sinuses and mastoid air cells are well-aerated. Other: Mild soft tissue swelling is noted overlying the left frontal calvarium. IMPRESSION: 1. Previously noted small focus of apparent subarachnoid hemorrhage along the right posterior parietal sulcus is no longer seen. 2. Mild soft tissue swelling overlying the left frontal calvarium. 3. Mildly comminuted fracture of the nasal bone, with rightward displacement. 4. Mild to moderate cortical volume loss and scattered small vessel ischemic microangiopathy. Chronic lacunar infarcts at the left basal ganglia. Electronically Signed   By: Garald Balding M.D.   On: 08/09/2016 03:56   Ct Head Wo Contrast  Result Date: 08/08/2016 CLINICAL DATA:  Head and neck pain and facial injuries after fall. EXAM: CT HEAD WITHOUT CONTRAST CT MAXILLOFACIAL WITHOUT CONTRAST CT CERVICAL SPINE WITHOUT CONTRAST TECHNIQUE: Multidetector CT imaging of the head, cervical spine, and maxillofacial structures were performed using the standard protocol without intravenous contrast. Multiplanar CT image reconstructions of the cervical spine and maxillofacial structures were  also generated. COMPARISON:  CT scan of May 22, 2007. FINDINGS: CT HEAD FINDINGS Brain: Mild chronic ischemic white matter disease is noted. Mild diffuse cortical atrophy is noted. No mass effect or midline shift is noted. Ventricular size is within normal limits. There is no evidence of mass lesion or acute infarction. Small high density focus is seen  in right posterior parietal sulcus concerning for small focus of subarachnoid hemorrhage. Vascular: No hyperdense vessel or unexpected calcification. Skull: Normal. Negative for fracture or focal lesion. Other: Old lacunar infarctions are noted in both basal ganglia bilaterally. Small left frontal scalp hematoma is noted. CT MAXILLOFACIAL FINDINGS Osseous: Moderately displaced and comminuted nasal bone fracture is noted, which is displaced to the right. Orbits: Negative. No traumatic or inflammatory finding. Sinuses: Right ethmoid sinusitis is noted. Bilateral maxillary sinusitis is noted. Soft tissues: Negative. CT CERVICAL SPINE FINDINGS Alignment: Normal. Skull base and vertebrae: No acute fracture. No primary bone lesion or focal pathologic process. Soft tissues and spinal canal: No prevertebral fluid or swelling. No visible canal hematoma. Disc levels: Severe degenerative disc disease is noted at C3-4, C4-5, C5-6 and C6-7 with anterior and posterior osteophyte formation. Upper chest: Negative. Other: None. IMPRESSION: Mild chronic ischemic white matter disease. Mild diffuse cortical atrophy. Small left frontal scalp hematoma. Probable small focus of subarachnoid hemorrhage seen in right posterior parietal sulcus. Moderately displaced and comminuted nasal bone fracture is noted. Severe multilevel degenerative disc disease is noted in the cervical spine. No fracture or spondylolisthesis is noted. Critical Value/emergent results were called by telephone at the time of interpretation on 08/08/2016 at 2:12 pm to Dr. Dalia Heading , who verbally acknowledged  these results. Electronically Signed   By: Marijo Conception, M.D.   On: 08/08/2016 14:13   Ct Cervical Spine Wo Contrast  Result Date: 08/08/2016 CLINICAL DATA:  Head and neck pain and facial injuries after fall. EXAM: CT HEAD WITHOUT CONTRAST CT MAXILLOFACIAL WITHOUT CONTRAST CT CERVICAL SPINE WITHOUT CONTRAST TECHNIQUE: Multidetector CT imaging of the head, cervical spine, and maxillofacial structures were performed using the standard protocol without intravenous contrast. Multiplanar CT image reconstructions of the cervical spine and maxillofacial structures were also generated. COMPARISON:  CT scan of May 22, 2007. FINDINGS: CT HEAD FINDINGS Brain: Mild chronic ischemic white matter disease is noted. Mild diffuse cortical atrophy is noted. No mass effect or midline shift is noted. Ventricular size is within normal limits. There is no evidence of mass lesion or acute infarction. Small high density focus is seen in right posterior parietal sulcus concerning for small focus of subarachnoid hemorrhage. Vascular: No hyperdense vessel or unexpected calcification. Skull: Normal. Negative for fracture or focal lesion. Other: Old lacunar infarctions are noted in both basal ganglia bilaterally. Small left frontal scalp hematoma is noted. CT MAXILLOFACIAL FINDINGS Osseous: Moderately displaced and comminuted nasal bone fracture is noted, which is displaced to the right. Orbits: Negative. No traumatic or inflammatory finding. Sinuses: Right ethmoid sinusitis is noted. Bilateral maxillary sinusitis is noted. Soft tissues: Negative. CT CERVICAL SPINE FINDINGS Alignment: Normal. Skull base and vertebrae: No acute fracture. No primary bone lesion or focal pathologic process. Soft tissues and spinal canal: No prevertebral fluid or swelling. No visible canal hematoma. Disc levels: Severe degenerative disc disease is noted at C3-4, C4-5, C5-6 and C6-7 with anterior and posterior osteophyte formation. Upper chest: Negative.  Other: None. IMPRESSION: Mild chronic ischemic white matter disease. Mild diffuse cortical atrophy. Small left frontal scalp hematoma. Probable small focus of subarachnoid hemorrhage seen in right posterior parietal sulcus. Moderately displaced and comminuted nasal bone fracture is noted. Severe multilevel degenerative disc disease is noted in the cervical spine. No fracture or spondylolisthesis is noted. Critical Value/emergent results were called by telephone at the time of interpretation on 08/08/2016 at 2:12 pm to Dr. Dalia Heading , who verbally acknowledged these results. Electronically Signed  By: Marijo Conception, M.D.   On: 08/08/2016 14:13   Ct Shoulder Right Wo Contrast  Result Date: 08/10/2016 CLINICAL DATA:  Status post fall.  Limited range of motion. EXAM: CT OF THE UPPER RIGHT EXTREMITY WITHOUT CONTRAST TECHNIQUE: Multidetector CT imaging of the upper right extremity was performed according to the standard protocol. COMPARISON:  None. FINDINGS: Bones/Joint/Cartilage No fracture or dislocation. Normal alignment. No joint effusion. Loss of the normal acromion humeral distance as can be seen with a chronic rotator cuff tear. Mild arthropathy of the acromioclavicular joint.  Type I acromion. Partially visualize is cervical spine spondylosis. Ligaments Ligaments are suboptimally evaluated by CT. Muscles and Tendons Mild supraspinatus muscle atrophy. Complete tear of the supraspinatus tendon. Soft tissue No fluid collection or hematoma. No soft tissue mass. Pacemaker power pack in the right anterior chest wall. Visualize right lung is clear. IMPRESSION: 1.  No acute osseous injury of the right shoulder. 2. Complete tear of the supraspinatus tendon with mild supraspinatus muscle atrophy. Electronically Signed   By: Kathreen Devoid   On: 08/10/2016 10:53   Dg Humerus Right  Result Date: 08/08/2016 CLINICAL DATA:  Right mid to distal arm pain following a fall today. EXAM: RIGHT HUMERUS - 2+ VIEW  COMPARISON:  Right shoulder series of March 15, 2013. FINDINGS: The humerus is subjectively adequately mineralized. The observed portions of the shoulder and elbow are intact. There is narrowing of the subacromial subdeltoid space which appears chronic when compared to the previous study. The soft tissues of the arm are unremarkable. IMPRESSION: There is no acute bony abnormality of the right humerus. Electronically Signed   By: David  [name] M.D.   On: 08/08/2016 14:00   Ct Maxillofacial Wo Cm  Result Date: 08/08/2016 CLINICAL DATA:  Head and neck pain and facial injuries after fall. EXAM: CT HEAD WITHOUT CONTRAST CT MAXILLOFACIAL WITHOUT CONTRAST CT CERVICAL SPINE WITHOUT CONTRAST TECHNIQUE: Multidetector CT imaging of the head, cervical spine, and maxillofacial structures were performed using the standard protocol without intravenous contrast. Multiplanar CT image reconstructions of the cervical spine and maxillofacial structures were also generated. COMPARISON:  CT scan of May 22, 2007. FINDINGS: CT HEAD FINDINGS Brain: Mild chronic ischemic white matter disease is noted. Mild diffuse cortical atrophy is noted. No mass effect or midline shift is noted. Ventricular size is within normal limits. There is no evidence of mass lesion or acute infarction. Small high density focus is seen in right posterior parietal sulcus concerning for small focus of subarachnoid hemorrhage. Vascular: No hyperdense vessel or unexpected calcification. Skull: Normal. Negative for fracture or focal lesion. Other: Old lacunar infarctions are noted in both basal ganglia bilaterally. Small left frontal scalp hematoma is noted. CT MAXILLOFACIAL FINDINGS Osseous: Moderately displaced and comminuted nasal bone fracture is noted, which is displaced to the right. Orbits: Negative. No traumatic or inflammatory finding. Sinuses: Right ethmoid sinusitis is noted. Bilateral maxillary sinusitis is noted. Soft tissues: Negative. CT CERVICAL  SPINE FINDINGS Alignment: Normal. Skull base and vertebrae: No acute fracture. No primary bone lesion or focal pathologic process. Soft tissues and spinal canal: No prevertebral fluid or swelling. No visible canal hematoma. Disc levels: Severe degenerative disc disease is noted at C3-4, C4-5, C5-6 and C6-7 with anterior and posterior osteophyte formation. Upper chest: Negative. Other: None. IMPRESSION: Mild chronic ischemic white matter disease. Mild diffuse cortical atrophy. Small left frontal scalp hematoma. Probable small focus of subarachnoid hemorrhage seen in right posterior parietal sulcus. Moderately displaced and comminuted nasal bone fracture  is noted. Severe multilevel degenerative disc disease is noted in the cervical spine. No fracture or spondylolisthesis is noted. Critical Value/emergent results were called by telephone at the time of interpretation on 08/08/2016 at 2:12 pm to Dr. Dalia Heading , who verbally acknowledged these results. Electronically Signed   By: Marijo Conception, M.D.   On: 08/08/2016 14:13    Lab Data:  CBC:  Recent Labs Lab 08/08/16 1410 08/09/16 0621 08/10/16 0255 08/11/16 0417  WBC 10.8* 11.0* 7.7 7.4  NEUTROABS 9.3*  --   --   --   HGB 13.8 13.1 11.8* 11.6*  HCT 41.2 39.9 35.1* 35.2*  MCV 95.2 94.8 95.9 95.9  PLT 150 146* 117* 270*   Basic Metabolic Panel:  Recent Labs Lab 08/08/16 1410 08/09/16 0621 08/10/16 0255 08/11/16 0417  NA 138 137 137 135  K 4.6 3.8 3.9 3.9  CL 105 107 106 106  CO2 24 23 23 22   GLUCOSE 256* 93 176* 190*  BUN 18 14 21* 15  CREATININE 1.21 1.09 1.19 1.11  CALCIUM 8.5* 8.6* 8.3* 8.1*   GFR: Estimated Creatinine Clearance: 43 mL/min (by C-G formula based on SCr of 1.11 mg/dL). Liver Function Tests: No results for input(s): AST, ALT, ALKPHOS, BILITOT, PROT, ALBUMIN in the last 168 hours. No results for input(s): LIPASE, AMYLASE in the last 168 hours. No results for input(s): AMMONIA in the last 168  hours. Coagulation Profile:  Recent Labs Lab 08/08/16 1410  INR 2.43   Cardiac Enzymes:  Recent Labs Lab 08/08/16 1847 08/09/16 0000 08/09/16 0621  TROPONINI <0.03 <0.03 <0.03   BNP (last 3 results) No results for input(s): PROBNP in the last 8760 hours. HbA1C:  Recent Labs  08/09/16 1032  HGBA1C 8.9*   CBG:  Recent Labs Lab 08/10/16 1643 08/10/16 2049 08/11/16 0751 08/11/16 1040 08/11/16 1139  GLUCAP 217* 202* 174* 241* 203*   Lipid Profile: No results for input(s): CHOL, HDL, LDLCALC, TRIG, CHOLHDL, LDLDIRECT in the last 72 hours. Thyroid Function Tests: No results for input(s): TSH, T4TOTAL, FREET4, T3FREE, THYROIDAB in the last 72 hours. Anemia Panel:  Recent Labs  08/11/16 0417  VITAMINB12 489  FOLATE 13.8   Urine analysis:    Component Value Date/Time   COLORURINE YELLOW 08/08/2016 1547   APPEARANCEUR CLEAR 08/08/2016 1547   LABSPEC 1.021 08/08/2016 1547   PHURINE 6.0 08/08/2016 1547   GLUCOSEU >=500 (A) 08/08/2016 1547   HGBUR NEGATIVE 08/08/2016 1547   BILIRUBINUR NEGATIVE 08/08/2016 1547   KETONESUR 5 (A) 08/08/2016 1547   PROTEINUR NEGATIVE 08/08/2016 1547   UROBILINOGEN 0.2 08/29/2012 1129   NITRITE NEGATIVE 08/08/2016 1547   LEUKOCYTESUR NEGATIVE 08/08/2016 1547     RAI,RIPUDEEP M.D. [facility] 08/11/2016, 11:47 AM  Pager: [phone] Between 7am to 7pm - call Pager - [phone]  After 7pm go to [email or web address] - password [email or web address]  Call night coverage person covering after 7pm

## 2016-08-11 NOTE — Clinical Social Work Placement (Signed)
   CLINICAL SOCIAL WORK PLACEMENT  NOTE  Date:  08/11/2016  Patient Details  Name: [name] MRN: [id number] Date of Birth: February 19, 1926  Clinical Social Work is seeking post-discharge placement for this patient at the [place] level of care (*CSW will initial, date and re-position this form in  chart as items are completed):  Yes   Patient/family provided with [facility] Work Department's list of facilities offering this level of care within the geographic area requested by the patient (or if unable, by the patient's family).  Yes   Patient/family informed of their freedom to choose among providers that offer the needed level of care, that participate in Medicare, Medicaid or managed care program needed by the patient, have an available bed and are willing to accept the patient.  Yes   Patient/family informed of [facility]'s ownership interest in [facility] and [facility], as well as of the fact that they are under no obligation to receive care at these facilities.  PASRR submitted to EDS on 08/11/16     PASRR number received on 08/11/16     Existing PASRR number confirmed on       FL2 transmitted to all facilities in geographic area requested by pt/family on 08/11/16     FL2 transmitted to all facilities within larger geographic area on       Patient informed that his/her managed care company has contracts with or will negotiate with certain facilities, including the following:            Patient/family informed of bed offers received.  Patient chooses bed at       Physician recommends and patient chooses bed at      Patient to be transferred to   on  .  Patient to be transferred to facility by       Patient family notified on   of transfer.  Name of family member notified:        PHYSICIAN Please sign FL2     Additional Comment:    _______________________________________________ Serafina Mitchell, LCSWA 08/11/2016, 2:37  PM

## 2016-08-11 NOTE — Progress Notes (Signed)
  Patient Details:  [name] 11/27/1925 [id number]   Subjective [name] is an 83 y/o male who injured his right shoulder due to a fall on 08/08/16.   Pain is well-controlled. The pain does increase if he tries to move his arm too much.  He had difficulty sleeping throughout the night due to generalized pain. He feels that the pain is beginning to decrease. Denies chest pain, shortness of breath, fever, chills, nausea, vomiting, or diarrhea.   Review of systems and past medical history reviewed and are listed in the chart.  Objective Vitals reviewed and are listed in the chart.  Awake and alert.  Grossly neurovascularly intact.  Pulses 2+ bilaterally. Capillary refill less than two seconds in bilateral hands. Sensation intact to light touch.  Able to wiggle his fingers, make a fist, cross first two fingers, and abduct thumb.  No open wounds or rashes.  Assessment/Plan Continue to use sling for support. The patient will follow up in the office to see Dr Veverly Fells for further non-surgical treatment.  We will continue to monitor his progress.   Brynda Peon 08/11/2016, 3:29 PM

## 2016-08-11 NOTE — NC FL2 (Signed)
[place] LEVEL OF CARE SCREENING TOOL     IDENTIFICATION  Patient Name: [name] Birthdate: 1925-12-03 Sex: male Admission Date (Current Location): 08/08/2016  [facility] and [place] Number:  Herbalist and Address:  [facility], [place] [address], [place], [place] [address]      Provider Number: [id number]  Attending Physician Name and Address:  Mendel Corning, MD  Relative Name and Phone Number:  Dtr [phone]    Current Level of Care: Hospital Recommended Level of Care: [place] Prior Approval Number:    Date Approved/Denied:   PASRR Number: [id number]  Discharge Plan: SNF    Current Diagnoses: Patient Active Problem List   Diagnosis Date Noted  . Syncope and collapse 08/08/2016  . SAH (subarachnoid hemorrhage) ([place]) 08/08/2016  . PCP NOTES >>>>>>>>>>>>>>>>>>>> 09/28/2015  . Vasomotor rhinitis 09/14/2014  . Cough 03/01/2014  . Right shoulder injury 03/15/2013  . Hiatal hernia 01/28/2013  . Erosive gastritis 01/28/2013  . Warfarin anticoagulation 01/28/2013  . Medicare annual wellness visit, subsequent 07/14/2012  . BPH (benign prostatic hyperplasia) 06/07/2009  . Osteopenia 06/07/2009  . Anemia due to chronic blood loss 11/25/2008  . Vitamin B 12 deficiency 06/01/2008  . HTN (hypertension) 06/01/2008  . PACEMAKER, PERMANENT 06/01/2008  . CORONARY ARTERY BYPASS GRAFT, HX OF 06/01/2008  . GERD 03/31/2007  . Hypothyroidism 01/26/2007  . DM (diabetes mellitus) with complications ([place]) 21/19/4174  . Atrial fibrillation ([place]) 01/26/2007  . Dyslipidemia 10/30/2006  . CAD (coronary artery disease) of artery bypass graft 10/02/2006  . HIP REPLACEMENT, RIGHT, HX OF 02/04/2003    Orientation RESPIRATION BLADDER Height & Weight     Self, Time, Situation, Place  Normal Incontinent Weight: 170 lb 8 oz (77.3 kg) Height:  5\' 7"  (170.2 cm)  BEHAVIORAL SYMPTOMS/MOOD NEUROLOGICAL BOWEL NUTRITION STATUS       Incontinent Diet (See DC Summary)  AMBULATORY STATUS COMMUNICATION OF NEEDS Skin   Limited Assist Verbally                         Personal Care Assistance Level of Assistance  Bathing, Dressing Bathing Assistance: Limited assistance   Dressing Assistance: Limited assistance     Functional Limitations Info  Sight, Hearing, Speech Sight Info: Adequate Hearing Info: Adequate Speech Info: Adequate    SPECIAL CARE FACTORS FREQUENCY  PT (By licensed PT), OT (By licensed OT), Speech therapy     PT Frequency: 5x wk OT Frequency: 5x wk     Speech Therapy Frequency: 5x wk      Contractures Contractures Info: Not present    Additional Factors Info  Code Status, Allergies Code Status Info: Full Allergies Info: Ace Inhibitors, Codeine Phosphate, Hydrochlorothiazide W-triamterene           Current Medications (08/11/2016):  This is the current hospital active medication list Current Facility-Administered Medications  Medication Dose Route Frequency Provider Last Rate Last Dose  . 0.9 %  sodium chloride infusion  250 mL Intravenous PRN Velvet Bathe, MD      . acetaminophen (TYLENOL) tablet 1,000 mg  1,000 mg Oral TID Ripudeep Krystal Eaton, MD   1,000 mg at 08/11/16 1032  . digoxin (LANOXIN) tablet 0.0625 mg  0.0625 mg Oral q1800 Velvet Bathe, MD   0.0625 mg at 08/10/16 1832  . fenofibrate tablet 160 mg  160 mg Oral Daily Velvet Bathe, MD   160 mg at 08/11/16 1032  .  HYDROmorphone (DILAUDID) injection 1 mg  1 mg Intravenous Q4H PRN Ripudeep Krystal Eaton, MD   1 mg at 08/11/16 0840  . insulin aspart (novoLOG) injection 0-5 Units  0-5 Units Subcutaneous QHS Velvet Bathe, MD   2 Units at 08/10/16 2141  . insulin aspart (novoLOG) injection 0-9 Units  0-9 Units Subcutaneous TID WC Ripudeep Krystal Eaton, MD   3 Units at 08/11/16 1243  . insulin aspart (novoLOG) injection 5 Units  5 Units Subcutaneous TID WC Ripudeep K Rai, MD      . insulin glargine (LANTUS) injection 40 Units  40 Units  Subcutaneous q1800 Ripudeep K Rai, MD      . latanoprost (XALATAN) 0.005 % ophthalmic solution 1 drop  1 drop Both Eyes QHS Velvet Bathe, MD   1 drop at 08/10/16 2140  . levETIRAcetam (KEPPRA) tablet 500 mg  500 mg Oral BID [place] Mink Costella, PA-C   500 mg at 08/11/16 1032  . levothyroxine (SYNTHROID, LEVOTHROID) tablet 88 mcg  88 mcg Oral QAC breakfast Velvet Bathe, MD   88 mcg at 08/11/16 0618  . nitroGLYCERIN (NITROSTAT) SL tablet 0.4 mg  0.4 mg Sublingual Q5 min PRN Velvet Bathe, MD      . pantoprazole (PROTONIX) EC tablet 40 mg  40 mg Oral Daily Velvet Bathe, MD   40 mg at 08/11/16 1032  . pravastatin (PRAVACHOL) tablet 80 mg  80 mg Oral q1800 Velvet Bathe, MD   80 mg at 08/10/16 1833  . sodium chloride flush (NS) 0.9 % injection 3 mL  3 mL Intravenous Q12H Velvet Bathe, MD   3 mL at 08/10/16 1000  . sodium chloride flush (NS) 0.9 % injection 3 mL  3 mL Intravenous Q12H Velvet Bathe, MD   3 mL at 08/10/16 2131  . sodium chloride flush (NS) 0.9 % injection 3 mL  3 mL Intravenous PRN Velvet Bathe, MD      . traMADol Veatrice Bourbon) tablet 50 mg  50 mg Oral Q6H PRN Ripudeep Krystal Eaton, MD   50 mg at 08/11/16 0618     Discharge Medications: Please see discharge summary for a list of discharge medications.  Relevant Imaging Results:  Relevant Lab Results:   Additional Information [phone]  Dyllin Gulley B, LCSWA

## 2016-08-11 NOTE — Clinical Social Work Note (Signed)
CSW notified the family would like to speak to [place] met with pt/family @ bedside. The family reports they were told by MD pt will need a SNF. The family reports the would like to select SNF. CSW advised the family of SNF process, CSW will start SNF process and advised SW would pass to weekday SW to present bed offers. [facility] 1st selection, [facility] is number 2. The family is aware everything is pending acceptance from facility and [employer].  CSW will leave handoff for weekday SW to follow. Pt will likely DC tomorrow.  Monserrat Vidaurri B. Joline Maxcy Clinical Social Work Dept Weekend Social Worker [phone] 2:04 PM

## 2016-08-12 ENCOUNTER — Inpatient Hospital Stay (HOSPITAL_COMMUNITY): Payer: Medicare HMO

## 2016-08-12 LAB — GLUCOSE, CAPILLARY
GLUCOSE-CAPILLARY: 208 mg/dL — AB (ref 65–99)
GLUCOSE-CAPILLARY: 181 mg/dL — AB (ref 65–99)
GLUCOSE-CAPILLARY: 272 mg/dL — AB (ref 65–99)
Glucose-Capillary: 199 mg/dL — ABNORMAL HIGH (ref 65–99)

## 2016-08-12 LAB — CBC
HCT: 35.3 % — ABNORMAL LOW (ref 39.0–52.0)
Hemoglobin: 11.5 g/dL — ABNORMAL LOW (ref 13.0–17.0)
MCH: 31.2 pg (ref 26.0–34.0)
MCHC: 32.6 g/dL (ref 30.0–36.0)
MCV: 95.7 fL (ref 78.0–100.0)
Platelets: 133 10*3/uL — ABNORMAL LOW (ref 150–400)
RBC: 3.69 MIL/uL — ABNORMAL LOW (ref 4.22–5.81)
RDW: 14.3 % (ref 11.5–15.5)
WBC: 6 10*3/uL (ref 4.0–10.5)

## 2016-08-12 LAB — BASIC METABOLIC PANEL
ANION GAP: 7 (ref 5–15)
BUN: 13 mg/dL (ref 6–20)
BUN: 13 mg/dL (ref 4–21)
CALCIUM: 8.1 mg/dL — AB (ref 8.9–10.3)
CO2: 23 mmol/L (ref 22–32)
Chloride: 106 mmol/L (ref 101–111)
Creatinine, Ser: 1.09 mg/dL (ref 0.61–1.24)
Creatinine: 1.1 mg/dL (ref 0.6–1.3)
GFR, EST NON AFRICAN AMERICAN: 59 mL/min — AB (ref >60)
GLUCOSE: 168 mg/dL
Glucose, Bld: 168 mg/dL — ABNORMAL HIGH (ref 65–99)
Potassium: 4 mmol/L (ref 3.5–5.1)
Sodium: 136 mmol/L (ref 135–145)
Sodium: 136 mmol/L — AB (ref 137–147)

## 2016-08-12 LAB — CBC AND DIFFERENTIAL: WBC: 6 10^3/mL

## 2016-08-12 MED ORDER — LEVETIRACETAM 500 MG PO TABS: 500.0000 mg | ORAL_TABLET | Freq: Two times a day (BID) | ORAL | 0 refills | Status: DC

## 2016-08-12 MED ORDER — TRAMADOL HCL 50 MG PO TABS: 50.0000 mg | ORAL_TABLET | Freq: Four times a day (QID) | ORAL | 0 refills | Status: DC | PRN

## 2016-08-12 MED ORDER — WARFARIN SODIUM 3 MG PO TABS: ORAL_TABLET | ORAL | 0 refills | Status: DC

## 2016-08-12 NOTE — Progress Notes (Signed)
   Subjective:    Recheck right shoulder s/p fall and rotator cuff tear Medical team agreed with cortisone injection Possible d/c home later today Otherwise doing fair  Patient reports pain as moderate.  Objective:   VITALS:   Vitals:   08/12/16 0546 08/12/16 0933  BP:  (!) 124/56  Pulse:  60  Resp: 18   Temp:      Right shoulder with painful rom nv intact distally Moderate weakness with ER and IR testing No rashes or edema  LABS  Recent Labs  08/10/16 0255 08/11/16 0417 08/12/16 0536  HGB 11.8* 11.6* 11.5*  HCT 35.1* 35.2* 35.3*  WBC 7.7 7.4 6.0  PLT 117* 124* 133*     Recent Labs  08/10/16 0255 08/11/16 0417 08/12/16 0536  NA 137 135 136  K 3.9 3.9 4.0  BUN 21* 15 13  CREATININE 1.19 1.11 1.09  GLUCOSE 176* 190* 168*     Assessment/Plan:   Plan for cortisone injection today for pain relief Scheduled for SNF later today f/u with orthopedics in 2-3 weeks if needed Will sign off for now  Procedure: after sterile prep and consent  4/1 of marcaine and kenalog injected into right shoulder subacromial space Pt tolerated procedure well     Merla Riches, MPAS, PA-C  08/12/2016, 12:27 PM

## 2016-08-12 NOTE — Clinical Social Work Note (Signed)
Insurance authorization still pending at this time. RN can call report to [phone].  [address] [place]

## 2016-08-12 NOTE — Progress Notes (Signed)
Patient c/o left hand and how it looks like his knuckle is displaced, just below 3rd middle finger and between middle and 4th finger in hand area.  Swelling same as 1st assessment this morning.  Dr. Tana Coast informed and xray of left hand ordered and I was instructed to contact Cline Crock with [facility].  [place] called @ [phone] and person answering phone took patient's name, DOB, description of complaint, xray ordered and my contact information.  She stated she will notify Cline Crock of above.  Patient and family updated on plan of care.

## 2016-08-12 NOTE — Discharge Summary (Signed)
Physician Discharge Summary   Patient ID: [name] MRN: [id number] DOB/AGE: December 10, 1925 83 y.o.  Admit date: 08/08/2016 Discharge date: 08/12/2016  Primary Care Physician:  Kathlene November, MD  Discharge Diagnoses:    . Syncope and collapseLikely vasovagal   Right rotator cuff tear    Subarachnoid hemorrhage . Hypothyroidism . DM (diabetes mellitus) with complications ([place]) . Dyslipidemia . HTN (hypertension) . CAD (coronary artery disease) of artery bypass graft . Atrial fibrillation [facility])   Consults:  Neurosurgery, Dr Cyndy Freeze Orthopedics, Dr Veverly Fells   Recommendations for Outpatient Follow-up:  1. PT recommended skilled nursing facility 2. Please repeat CBC/BMET at next visit 3. Per orthopedics, continue to use sling for support, follow up with Dr. Hassell Done in office 4. Continue Keppra for 7 days, fall precautions 5. Hold aspirin and warfarin for now, until further instructions from his cardiologist, Dr. Harrington Challenger or his PCP, [name]   DIET: Carb modified diet    Allergies:   Allergies  Allergen Reactions  . Ace Inhibitors Cough  . Codeine Phosphate Nausea Only  . Hydrochlorothiazide W-Triamterene Other (See Comments)    CRAMPING     DISCHARGE MEDICATIONS: Current Discharge Medication List    START taking these medications   Details  levETIRAcetam (KEPPRA) 500 MG tablet Take 1 tablet (500 mg total) by mouth 2 (two) times daily. X 7days Qty: 14 tablet, Refills: 0    traMADol (ULTRAM) 50 MG tablet Take 1 tablet (50 mg total) by mouth every 6 (six) hours as needed for moderate pain. Qty: 20 tablet, Refills: 0      CONTINUE these medications which have CHANGED   Details  warfarin (COUMADIN) 3 MG tablet HOLD COUMADIN UNTIL FURTHER INSTRUCTIONS FROM YOUR PCP or Dr ROSS/ DR Lynann Beaver: 1 tablet, Refills: 0      CONTINUE these medications which have NOT CHANGED   Details  amoxicillin (AMOXIL) 500 MG capsule Take 2,000 mg by mouth See admin instructions. ONE HOUR PRIOR  TO EACH DENTAL APPOINTMENT    Blood Glucose Monitoring Suppl (TRUE METRIX METER) w/Device KIT Check blood sugar four times daily. Qty: 1 kit, Refills: 0    digoxin (LANOXIN) 0.125 MG tablet Take 0.5 tablets (0.0625 mg total) by mouth daily. Qty: 45 tablet, Refills: 3    EASY TOUCH INSULIN SYRINGE 31G X 5/16" 1 ML MISC USE AS DIRECTED WITH NPH INSULIN Qty: 100 each, Refills: 5    fenofibrate micronized (LOFIBRA) 134 MG capsule Take 1 capsule (134 mg total) by mouth daily. Qty: 90 capsule, Refills: 2    glucose blood (TRUE METRIX BLOOD GLUCOSE TEST) test strip Check blood sugar four times daily. Qty: 400 each, Refills: 12    Insulin Glargine (LANTUS) 100 UNIT/ML Solostar Pen Inject 50 units into the skin every night. Qty: 15 mL, Refills: 11    Insulin Pen Needle 31G X 6 MM MISC To use w/ Lantus Qty: 100 each, Refills: 12    latanoprost (XALATAN) 0.005 % ophthalmic solution Place 1 drop into both eyes at bedtime.    levothyroxine (SYNTHROID, LEVOTHROID) 88 MCG tablet Take 1 tablet (88 mcg total) by mouth daily before breakfast. Qty: 90 tablet, Refills: 1    metoprolol tartrate (LOPRESSOR) 25 MG tablet Take 1 tablet (25 mg total) by mouth 2 (two) times daily. Qty: 180 tablet, Refills: 2    omeprazole (PRILOSEC) 40 MG capsule Take 1 capsule (40 mg total) by mouth daily. Qty: 90 capsule, Refills: 3    pravastatin (PRAVACHOL) 40 MG tablet Take 2  tablets (80 mg total) by mouth daily. Qty: 180 tablet, Refills: 1    sitaGLIPtin (JANUVIA) 100 MG tablet Take 1 tablet (100 mg total) by mouth daily. Qty: 90 tablet, Refills: 1    vitamin B-12 (CYANOCOBALAMIN) 1000 MCG tablet Take 1 tablet (1,000 mcg total) by mouth daily. Qty: 90 tablet, Refills: 2    nitroGLYCERIN (NITROSTAT) 0.4 MG SL tablet Place 0.4 mg under the tongue every 5 (five) minutes as needed for chest pain. Reported on 09/27/2015      STOP taking these medications     aspirin 81 MG tablet          Brief H and  P: For complete details please refer to admission H and P, but in brief Patient is a 83 year old male with history of atrial fibrillation, CAD status post CABG presented to the ED after a fall and had multiple facial abrasions. Patient was unaware of why he fell. Patient reported that he was helping his neighbor blow the leaves in the yard and then he may have passed out or tripped. Per his daughter he does have a history of vasovagal episodes. He did not have any bowel or bladder incontinence, no fitness seizure-like activity. CT of the head showed probable small focus of subarachnoid hemorrhage seen in the right posterior parietal sulcus and moderately displaced and comminuted nasal bone fracture. Patient was admitted and Neurosurgery consult was obtained.   Hospital Course:  SAH (subarachnoid hemorrhage) (HCC) - CT head showed Probable small focus of subarachnoid hemorrhage seen in right posterior parietal sulcus. - Neurosurgery was consulted and recommended observation and repeat CT head in 12 hours, Keppra for 7 days - Repeat CT head was done which showed previously noted small focus of apparent subarachnoid hemorrhage along the right posterior parietal sulcus is no longer seen - Hold aspirin, warfarin for now. I discussed with patient's family (3 daughters in the room) regarding anticoagulation, they did not feel comfortable stopping the warfarin permanently given the stroke risk with atrial fibrillation. However they will discuss patient's cardiologist, Dr. Harrington Challenger regarding risks and benefits of anticoagulation given the history of falls, SAH and make decision.    Active Problems: Syncope: Possibly due to vasovagal episode -  I discussed in detail with patient's daughters who reported that patient has a history of vasovagal episodes - Orthostatics were mildly positive, patient was placed on IV fluid hydration.  - carotid Dopplers showed 1-39% stenosis of the right internal carotid artery and  the left internal carotid artery - PT evaluation recommended skilled nursing facility - 2-D echo showed EF of 60-65% with grade 2 diastolic dysfunction - B-15 folate cortisol within normal limits   RIGHT shoulder pain secondary to rotator cuff tear - X-ray of the right humerus showed no fracture or dislocation -  Continue pain control, sling for comfort - Reviewed ortho note, patient is medically stable for cortisone injection for the right shoulder - CT shoulder consistent with Complete tear of the supraspinatus tendon with mild supraspinatus muscle  - Per orthopedics, Dr. Veverly Fells, sling for support and outpatient follow-up     Hypothyroidism - Continue levothyroxine     DM (diabetes mellitus) with complications ([place]) - Elevated CBGs with hyperglycemia, continue Lantus, Januvia at home. - While inpatient, patient was placed on Lantus, meal coverage and sliding scale insulin.  - Hemoglobin A1c 8.9    Dyslipidemia - Continue Pravachol     HTN (hypertension) - Not on any antihypertensives, BP stable    CAD (coronary artery  disease) of artery bypass graft - Continue statin, hold aspirin,warfarin - Follow up with cardiology outpatient, with Dr. Lovena Le or Dr. Harrington Challenger    Atrial fibrillation [facility]) - Currently rate controlled, continue digoxin, continue to hold warfarin  - Given history of vasovagal episodes, falls, subarachnoid hemorrhage, I recommended to follow-up with cardiology and discuss risk and benefits whether to continue warfarin at this point   mildly comminuted nasal bone fracture - Outpatient follow-up with ENT   Day of Discharge BP (!) 124/56 (BP Location: Left Arm)   Pulse 60   Temp 98 F (36.7 C) (Oral)   Resp 18   Ht '5\' 7"'  (1.702 m)   Wt 75 kg (165 lb 4.8 oz)   SpO2 99%   BMI 25.89 kg/m   Physical Exam: General: Alert and awake oriented x3 not in any acute distress. HEENT: anicteric sclera, pupils reactive to light and accommodation, multiple  abrasions, ecchymosis on the face CVS: S1-S2 clear no murmur rubs or gallops Chest: clear to auscultation bilaterally, no wheezing rales or rhonchi Abdomen: soft nontender, nondistended, normal bowel sounds Extremities: no cyanosis, clubbing or edema noted bilaterally Neuro: Cranial nerves II-XII intact, no focal neurological deficits   The results of significant diagnostics from this hospitalization (including imaging, microbiology, ancillary and laboratory) are listed below for reference.    LAB RESULTS: Basic Metabolic Panel:  Recent Labs Lab 08/11/16 0417 08/12/16 0536  NA 135 136  K 3.9 4.0  CL 106 106  CO2 22 23  GLUCOSE 190* 168*  BUN 15 13  CREATININE 1.11 1.09  CALCIUM 8.1* 8.1*   Liver Function Tests: No results for input(s): AST, ALT, ALKPHOS, BILITOT, PROT, ALBUMIN in the last 168 hours. No results for input(s): LIPASE, AMYLASE in the last 168 hours. No results for input(s): AMMONIA in the last 168 hours. CBC:  Recent Labs Lab 08/08/16 1410  08/11/16 0417 08/12/16 0536  WBC 10.8*  < > 7.4 6.0  NEUTROABS 9.3*  --   --   --   HGB 13.8  < > 11.6* 11.5*  HCT 41.2  < > 35.2* 35.3*  MCV 95.2  < > 95.9 95.7  PLT 150  < > 124* 133*  < > = values in this interval not displayed. Cardiac Enzymes:  Recent Labs Lab 08/09/16 0000 08/09/16 0621  TROPONINI <0.03 <0.03   BNP: Invalid input(s): POCBNP CBG:  Recent Labs Lab 08/11/16 2214 08/12/16 0804  GLUCAP 188* 199*    Significant Diagnostic Studies:  Dg Wrist Complete Left  Result Date: 08/08/2016 CLINICAL DATA:  Status post fall today and complains of mid to distal right arm pain. No definite wrist pain. The patient does not were called the particulars of the fall. EXAM: LEFT WRIST - COMPLETE 3+ VIEW COMPARISON:  None in PACs FINDINGS: The bones of the left wrist are subjectively adequately mineralized. The distal radius and ulna are intact. There is mild narrowing of the radiocarpal joint. There is  moderate severe narrowing of the first [facility] joint. No acute carpal bone fracture is observed. The metacarpals are intact where visualized. IMPRESSION: There are degenerative changes of the left wrist centered on the radiocarpal joint and first [facility]MC joint. There is no acute fracture nor dislocation. Electronically Signed   By: David  [name] M.D.   On: 08/08/2016 13:57   Ct Head Wo Contrast  Result Date: 08/09/2016 CLINICAL DATA:  Question of subarachnoid hemorrhage at the right posterior parietal sulcus. Initial encounter. EXAM: CT HEAD WITHOUT CONTRAST TECHNIQUE: Contiguous  axial images were obtained from the base of the skull through the vertex without intravenous contrast. COMPARISON:  CT of the head performed 08/08/2016 FINDINGS: Brain: The previously noted small focus of apparent subarachnoid hemorrhage along the right posterior parietal sulcus is no longer seen. Prominence of the ventricles and sulci reflects mild to moderate cortical volume loss. Mild cerebellar atrophy is noted. Scattered periventricular and subcortical white matter change likely reflects small vessel ischemic microangiopathy. Chronic lacunar infarcts are noted at the left basal ganglia. The brainstem and fourth ventricle are within normal limits. The cerebral hemispheres demonstrate grossly normal gray-white differentiation. No mass effect or midline shift is seen. Vascular: No hyperdense vessel or unexpected calcification. Skull: A mildly comminuted fracture of the nasal bone is again noted, with rightward displacement. Sinuses/Orbits: The visualized portions of the orbits are within normal limits. The paranasal sinuses and mastoid air cells are well-aerated. Other: Mild soft tissue swelling is noted overlying the left frontal calvarium. IMPRESSION: 1. Previously noted small focus of apparent subarachnoid hemorrhage along the right posterior parietal sulcus is no longer seen. 2. Mild soft tissue swelling overlying the left frontal  calvarium. 3. Mildly comminuted fracture of the nasal bone, with rightward displacement. 4. Mild to moderate cortical volume loss and scattered small vessel ischemic microangiopathy. Chronic lacunar infarcts at the left basal ganglia. Electronically Signed   By: Garald Balding M.D.   On: 08/09/2016 03:56   Ct Head Wo Contrast  Result Date: 08/08/2016 CLINICAL DATA:  Head and neck pain and facial injuries after fall. EXAM: CT HEAD WITHOUT CONTRAST CT MAXILLOFACIAL WITHOUT CONTRAST CT CERVICAL SPINE WITHOUT CONTRAST TECHNIQUE: Multidetector CT imaging of the head, cervical spine, and maxillofacial structures were performed using the standard protocol without intravenous contrast. Multiplanar CT image reconstructions of the cervical spine and maxillofacial structures were also generated. COMPARISON:  CT scan of May 22, 2007. FINDINGS: CT HEAD FINDINGS Brain: Mild chronic ischemic white matter disease is noted. Mild diffuse cortical atrophy is noted. No mass effect or midline shift is noted. Ventricular size is within normal limits. There is no evidence of mass lesion or acute infarction. Small high density focus is seen in right posterior parietal sulcus concerning for small focus of subarachnoid hemorrhage. Vascular: No hyperdense vessel or unexpected calcification. Skull: Normal. Negative for fracture or focal lesion. Other: Old lacunar infarctions are noted in both basal ganglia bilaterally. Small left frontal scalp hematoma is noted. CT MAXILLOFACIAL FINDINGS Osseous: Moderately displaced and comminuted nasal bone fracture is noted, which is displaced to the right. Orbits: Negative. No traumatic or inflammatory finding. Sinuses: Right ethmoid sinusitis is noted. Bilateral maxillary sinusitis is noted. Soft tissues: Negative. CT CERVICAL SPINE FINDINGS Alignment: Normal. Skull base and vertebrae: No acute fracture. No primary bone lesion or focal pathologic process. Soft tissues and spinal canal: No  prevertebral fluid or swelling. No visible canal hematoma. Disc levels: Severe degenerative disc disease is noted at C3-4, C4-5, C5-6 and C6-7 with anterior and posterior osteophyte formation. Upper chest: Negative. Other: None. IMPRESSION: Mild chronic ischemic white matter disease. Mild diffuse cortical atrophy. Small left frontal scalp hematoma. Probable small focus of subarachnoid hemorrhage seen in right posterior parietal sulcus. Moderately displaced and comminuted nasal bone fracture is noted. Severe multilevel degenerative disc disease is noted in the cervical spine. No fracture or spondylolisthesis is noted. Critical Value/emergent results were called by telephone at the time of interpretation on 08/08/2016 at 2:12 pm to Dr. Dalia Heading , who verbally acknowledged these results. Electronically Signed  By: Marijo Conception, M.D.   On: 08/08/2016 14:13   Ct Cervical Spine Wo Contrast  Result Date: 08/08/2016 CLINICAL DATA:  Head and neck pain and facial injuries after fall. EXAM: CT HEAD WITHOUT CONTRAST CT MAXILLOFACIAL WITHOUT CONTRAST CT CERVICAL SPINE WITHOUT CONTRAST TECHNIQUE: Multidetector CT imaging of the head, cervical spine, and maxillofacial structures were performed using the standard protocol without intravenous contrast. Multiplanar CT image reconstructions of the cervical spine and maxillofacial structures were also generated. COMPARISON:  CT scan of May 22, 2007. FINDINGS: CT HEAD FINDINGS Brain: Mild chronic ischemic white matter disease is noted. Mild diffuse cortical atrophy is noted. No mass effect or midline shift is noted. Ventricular size is within normal limits. There is no evidence of mass lesion or acute infarction. Small high density focus is seen in right posterior parietal sulcus concerning for small focus of subarachnoid hemorrhage. Vascular: No hyperdense vessel or unexpected calcification. Skull: Normal. Negative for fracture or focal lesion. Other: Old lacunar  infarctions are noted in both basal ganglia bilaterally. Small left frontal scalp hematoma is noted. CT MAXILLOFACIAL FINDINGS Osseous: Moderately displaced and comminuted nasal bone fracture is noted, which is displaced to the right. Orbits: Negative. No traumatic or inflammatory finding. Sinuses: Right ethmoid sinusitis is noted. Bilateral maxillary sinusitis is noted. Soft tissues: Negative. CT CERVICAL SPINE FINDINGS Alignment: Normal. Skull base and vertebrae: No acute fracture. No primary bone lesion or focal pathologic process. Soft tissues and spinal canal: No prevertebral fluid or swelling. No visible canal hematoma. Disc levels: Severe degenerative disc disease is noted at C3-4, C4-5, C5-6 and C6-7 with anterior and posterior osteophyte formation. Upper chest: Negative. Other: None. IMPRESSION: Mild chronic ischemic white matter disease. Mild diffuse cortical atrophy. Small left frontal scalp hematoma. Probable small focus of subarachnoid hemorrhage seen in right posterior parietal sulcus. Moderately displaced and comminuted nasal bone fracture is noted. Severe multilevel degenerative disc disease is noted in the cervical spine. No fracture or spondylolisthesis is noted. Critical Value/emergent results were called by telephone at the time of interpretation on 08/08/2016 at 2:12 pm to Dr. Dalia Heading , who verbally acknowledged these results. Electronically Signed   By: Marijo Conception, M.D.   On: 08/08/2016 14:13   Dg Humerus Right  Result Date: 08/08/2016 CLINICAL DATA:  Right mid to distal arm pain following a fall today. EXAM: RIGHT HUMERUS - 2+ VIEW COMPARISON:  Right shoulder series of March 15, 2013. FINDINGS: The humerus is subjectively adequately mineralized. The observed portions of the shoulder and elbow are intact. There is narrowing of the subacromial subdeltoid space which appears chronic when compared to the previous study. The soft tissues of the arm are unremarkable. IMPRESSION:  There is no acute bony abnormality of the right humerus. Electronically Signed   By: David  [name] M.D.   On: 08/08/2016 14:00   Ct Maxillofacial Wo Cm  Result Date: 08/08/2016 CLINICAL DATA:  Head and neck pain and facial injuries after fall. EXAM: CT HEAD WITHOUT CONTRAST CT MAXILLOFACIAL WITHOUT CONTRAST CT CERVICAL SPINE WITHOUT CONTRAST TECHNIQUE: Multidetector CT imaging of the head, cervical spine, and maxillofacial structures were performed using the standard protocol without intravenous contrast. Multiplanar CT image reconstructions of the cervical spine and maxillofacial structures were also generated. COMPARISON:  CT scan of May 22, 2007. FINDINGS: CT HEAD FINDINGS Brain: Mild chronic ischemic white matter disease is noted. Mild diffuse cortical atrophy is noted. No mass effect or midline shift is noted. Ventricular size is within normal limits. There  is no evidence of mass lesion or acute infarction. Small high density focus is seen in right posterior parietal sulcus concerning for small focus of subarachnoid hemorrhage. Vascular: No hyperdense vessel or unexpected calcification. Skull: Normal. Negative for fracture or focal lesion. Other: Old lacunar infarctions are noted in both basal ganglia bilaterally. Small left frontal scalp hematoma is noted. CT MAXILLOFACIAL FINDINGS Osseous: Moderately displaced and comminuted nasal bone fracture is noted, which is displaced to the right. Orbits: Negative. No traumatic or inflammatory finding. Sinuses: Right ethmoid sinusitis is noted. Bilateral maxillary sinusitis is noted. Soft tissues: Negative. CT CERVICAL SPINE FINDINGS Alignment: Normal. Skull base and vertebrae: No acute fracture. No primary bone lesion or focal pathologic process. Soft tissues and spinal canal: No prevertebral fluid or swelling. No visible canal hematoma. Disc levels: Severe degenerative disc disease is noted at C3-4, C4-5, C5-6 and C6-7 with anterior and posterior osteophyte  formation. Upper chest: Negative. Other: None. IMPRESSION: Mild chronic ischemic white matter disease. Mild diffuse cortical atrophy. Small left frontal scalp hematoma. Probable small focus of subarachnoid hemorrhage seen in right posterior parietal sulcus. Moderately displaced and comminuted nasal bone fracture is noted. Severe multilevel degenerative disc disease is noted in the cervical spine. No fracture or spondylolisthesis is noted. Critical Value/emergent results were called by telephone at the time of interpretation on 08/08/2016 at 2:12 pm to Dr. Dalia Heading , who verbally acknowledged these results. Electronically Signed   By: Marijo Conception, M.D.   On: 08/08/2016 14:13    2D ECHO: Study Conclusions  - Left ventricle: The cavity size was normal. There was moderate   concentric hypertrophy. Systolic function was normal. The   estimated ejection fraction was in the range of 60% to 65%. Wall   motion was normal; there were no regional wall motion   abnormalities. Features are consistent with a pseudonormal left   ventricular filling pattern, with concomitant abnormal relaxation   and increased filling pressure (grade 2 diastolic dysfunction).   Doppler parameters are consistent with elevated ventricular   end-diastolic filling pressure. - Aortic valve: There was mild regurgitation. - Mitral valve: Structurally normal valve. There was mild   regurgitation. - Left atrium: The atrium was moderately dilated. - Right ventricle: Pacer wire or catheter noted in right ventricle.   Systolic function was normal. - Right atrium: The atrium was mildly dilated. Pacer wire or   catheter noted in right atrium. - Tricuspid valve: There was mild regurgitation. - Pulmonary arteries: Systolic pressure was mildly increased. PA   peak pressure: 39 mm Hg (S). - Inferior vena cava: The vessel was normal in size.  Disposition and Follow-up: Discharge Instructions    Diet Carb Modified     Complete by:  As directed    Increase activity slowly    Complete by:  As directed        DISPOSITION: SNF  [place], MD. Schedule an appointment as soon as possible for a visit in 10 day(s).   Specialty:  Internal Medicine Contact information: [address] [place] [address] [phone]        Augustin Schooling, MD. Schedule an appointment as soon as possible for a visit in 2 week(s).   Specialty:  Orthopedic Surgery Why:  follow-up for your right shoulder  Contact information: [address] [place] [address] [phone]        Kevan Ny Ditty, MD. Schedule an appointment as soon  as possible for a visit in 1 week(s).   Specialty:  Neurosurgery Contact information: [address] [place] [address] [phone]        Dorris Carnes, MD. Schedule an appointment as soon as possible for a visit in 1 week(s).   Specialty:  Cardiology Why:  Please discuss about aspirin and warfarin  Contact information: [place] [address] [phone]        SHOEMAKER, DAVID, MD. Schedule an appointment as soon as possible for a visit in 2 week(s).   Specialty:  Otolaryngology Why:  for your nasal bone fracture Contact information: [address] [place] [address] [place] [address] [phone]            Time spent on Discharge: 16mns   Signed:   Michio Thier M.D. [facility] 08/12/2016, 10:21 AM Pager: [phone]

## 2016-08-12 NOTE — Progress Notes (Signed)
Brad, Orthopaedic PA called to say x-ray of left hand negative for fracture.

## 2016-08-12 NOTE — Progress Notes (Signed)
[name], SW called and stated authorization of insurance will not happen today and will be 1st thing in the morning, so discharge will be tomorrow.  Dr. Tana Coast informed via telephone and patient and daughter @ bedside updated on plan of care.

## 2016-08-13 ENCOUNTER — Non-Acute Institutional Stay (SKILLED_NURSING_FACILITY): Payer: Medicare HMO | Admitting: Internal Medicine

## 2016-08-13 ENCOUNTER — Encounter: Payer: Self-pay | Admitting: Internal Medicine

## 2016-08-13 ENCOUNTER — Telehealth: Payer: Self-pay | Admitting: Internal Medicine

## 2016-08-13 DIAGNOSIS — M75111 Incomplete rotator cuff tear or rupture of right shoulder, not specified as traumatic: Secondary | ICD-10-CM | POA: Diagnosis not present

## 2016-08-13 DIAGNOSIS — Z950 Presence of cardiac pacemaker: Secondary | ICD-10-CM | POA: Diagnosis not present

## 2016-08-13 DIAGNOSIS — I2510 Atherosclerotic heart disease of native coronary artery without angina pectoris: Secondary | ICD-10-CM | POA: Diagnosis not present

## 2016-08-13 DIAGNOSIS — I482 Chronic atrial fibrillation, unspecified: Secondary | ICD-10-CM

## 2016-08-13 DIAGNOSIS — I25810 Atherosclerosis of coronary artery bypass graft(s) without angina pectoris: Secondary | ICD-10-CM | POA: Diagnosis not present

## 2016-08-13 DIAGNOSIS — I10 Essential (primary) hypertension: Secondary | ICD-10-CM | POA: Diagnosis not present

## 2016-08-13 DIAGNOSIS — Z5181 Encounter for therapeutic drug level monitoring: Secondary | ICD-10-CM | POA: Diagnosis not present

## 2016-08-13 DIAGNOSIS — I609 Nontraumatic subarachnoid hemorrhage, unspecified: Secondary | ICD-10-CM | POA: Diagnosis not present

## 2016-08-13 DIAGNOSIS — R2681 Unsteadiness on feet: Secondary | ICD-10-CM | POA: Diagnosis not present

## 2016-08-13 DIAGNOSIS — S0993XD Unspecified injury of face, subsequent encounter: Secondary | ICD-10-CM | POA: Diagnosis not present

## 2016-08-13 DIAGNOSIS — M25511 Pain in right shoulder: Secondary | ICD-10-CM | POA: Diagnosis not present

## 2016-08-13 DIAGNOSIS — E785 Hyperlipidemia, unspecified: Secondary | ICD-10-CM

## 2016-08-13 DIAGNOSIS — E118 Type 2 diabetes mellitus with unspecified complications: Secondary | ICD-10-CM

## 2016-08-13 DIAGNOSIS — M6281 Muscle weakness (generalized): Secondary | ICD-10-CM | POA: Diagnosis not present

## 2016-08-13 DIAGNOSIS — R262 Difficulty in walking, not elsewhere classified: Secondary | ICD-10-CM | POA: Diagnosis not present

## 2016-08-13 DIAGNOSIS — S4991XD Unspecified injury of right shoulder and upper arm, subsequent encounter: Secondary | ICD-10-CM | POA: Diagnosis not present

## 2016-08-13 DIAGNOSIS — S022XXD Fracture of nasal bones, subsequent encounter for fracture with routine healing: Secondary | ICD-10-CM | POA: Diagnosis not present

## 2016-08-13 DIAGNOSIS — R55 Syncope and collapse: Secondary | ICD-10-CM

## 2016-08-13 DIAGNOSIS — Z951 Presence of aortocoronary bypass graft: Secondary | ICD-10-CM | POA: Diagnosis not present

## 2016-08-13 DIAGNOSIS — M75121 Complete rotator cuff tear or rupture of right shoulder, not specified as traumatic: Secondary | ICD-10-CM | POA: Diagnosis not present

## 2016-08-13 DIAGNOSIS — Z7901 Long term (current) use of anticoagulants: Secondary | ICD-10-CM | POA: Diagnosis not present

## 2016-08-13 DIAGNOSIS — M75101 Unspecified rotator cuff tear or rupture of right shoulder, not specified as traumatic: Secondary | ICD-10-CM | POA: Diagnosis not present

## 2016-08-13 DIAGNOSIS — S066X0D Traumatic subarachnoid hemorrhage without loss of consciousness, subsequent encounter: Secondary | ICD-10-CM | POA: Diagnosis not present

## 2016-08-13 LAB — GLUCOSE, CAPILLARY
GLUCOSE-CAPILLARY: 329 mg/dL — AB (ref 65–99)
Glucose-Capillary: 231 mg/dL — ABNORMAL HIGH (ref 65–99)

## 2016-08-13 MED ORDER — LEVETIRACETAM 500 MG PO TABS: 500.0000 mg | ORAL_TABLET | Freq: Two times a day (BID) | ORAL | 0 refills | Status: DC

## 2016-08-13 NOTE — Clinical Social Work Note (Signed)
CSW facilitated patient discharge including contacting patient family and facility to confirm patient discharge plans. Clinical information faxed to facility and family agreeable with plan. Patient's daughter will transport by car to [facility]. RN to call report prior to discharge [phone] Room [phone]).  CSW will sign off for now as social work intervention is no longer needed. Please consult [place] again if new needs arise.  Dayton Scrape, [place]

## 2016-08-13 NOTE — Progress Notes (Signed)
Pt slept on and off overnight, SCD's on, tramadol given once for right shoulder pain, other than that vitals stable, no any complain of SOB and distress, output is maintained overnight and recorded, will continue to monitor the patient.

## 2016-08-13 NOTE — Clinical Social Work Note (Signed)
Patient's clinicals have been sent to the medical director at [facility] for review. They were unable to approve him at the nurse level. Nurse stated that clinicals should be reviewed this morning.  Dayton Scrape, [place]

## 2016-08-13 NOTE — Progress Notes (Signed)
I have updated the discharge summary done yesterday and addended it. Patient is stable for DC to SNF.    RAI,RIPUDEEP M.D. [facility] 08/13/2016, 11:36 AM  Pager: [phone]

## 2016-08-13 NOTE — Care Management Important Message (Signed)
Important Message  Patient Details  Name: [name] MRN: [id number] Date of Birth: [birth year]   Medicare Important Message Given:  Yes    Orbie Pyo 08/13/2016, 2:17 PM

## 2016-08-13 NOTE — Discharge Summary (Signed)
Physician Discharge Summary   Patient ID: [name] MRN: [id number] DOB/AGE: [birth year] 81 y.o.  Admit date: 08/08/2016 Discharge date: 08/13/2016  Primary Care Physician:  Kathlene November, MD  Discharge Diagnoses:    . Syncope and collapseLikely vasovagal   Right rotator cuff tear    Subarachnoid hemorrhage . Hypothyroidism . DM (diabetes mellitus) with complications ([place]) . Dyslipidemia . HTN (hypertension) . CAD (coronary artery disease) of artery bypass graft . Atrial fibrillation [facility])   Consults:  Neurosurgery, Dr Cyndy Freeze Orthopedics, Dr Veverly Fells   Recommendations for Outpatient Follow-up:  1. PT recommended skilled nursing facility 2. Please repeat CBC/BMET at next visit 3. Per orthopedics, continue to use sling for support, follow up with Dr. Hassell Done in office 4. Continue Keppra for 7 days, fall precautions 5. Hold aspirin and warfarin for now, until further instructions from his cardiologist, Dr. Harrington Challenger or his PCP, [name]   DIET: Carb modified diet    Allergies:   Allergies  Allergen Reactions  . Ace Inhibitors Cough  . Codeine Phosphate Nausea Only  . Hydrochlorothiazide W-Triamterene Other (See Comments)    CRAMPING     DISCHARGE MEDICATIONS: Current Discharge Medication List    START taking these medications   Details  levETIRAcetam (KEPPRA) 500 MG tablet Take 1 tablet (500 mg total) by mouth 2 (two) times daily. X 7days Qty: 14 tablet, Refills: 0    traMADol (ULTRAM) 50 MG tablet Take 1 tablet (50 mg total) by mouth every 6 (six) hours as needed for moderate pain. Qty: 20 tablet, Refills: 0      CONTINUE these medications which have CHANGED   Details  warfarin (COUMADIN) 3 MG tablet HOLD COUMADIN UNTIL FURTHER INSTRUCTIONS FROM YOUR PCP or Dr ROSS/ DR Lynann Beaver: 1 tablet, Refills: 0      CONTINUE these medications which have NOT CHANGED   Details  amoxicillin (AMOXIL) 500 MG capsule Take 2,000 mg by mouth See admin instructions. ONE HOUR PRIOR  TO EACH DENTAL APPOINTMENT    Blood Glucose Monitoring Suppl (TRUE METRIX METER) w/Device KIT Check blood sugar four times daily. Qty: 1 kit, Refills: 0    digoxin (LANOXIN) 0.125 MG tablet Take 0.5 tablets (0.0625 mg total) by mouth daily. Qty: 45 tablet, Refills: 3    EASY TOUCH INSULIN SYRINGE 31G X 5/16" 1 ML MISC USE AS DIRECTED WITH NPH INSULIN Qty: 100 each, Refills: 5    fenofibrate micronized (LOFIBRA) 134 MG capsule Take 1 capsule (134 mg total) by mouth daily. Qty: 90 capsule, Refills: 2    glucose blood (TRUE METRIX BLOOD GLUCOSE TEST) test strip Check blood sugar four times daily. Qty: 400 each, Refills: 12    Insulin Glargine (LANTUS) 100 UNIT/ML Solostar Pen Inject 50 units into the skin every night. Qty: 15 mL, Refills: 11    Insulin Pen Needle 31G X 6 MM MISC To use w/ Lantus Qty: 100 each, Refills: 12    latanoprost (XALATAN) 0.005 % ophthalmic solution Place 1 drop into both eyes at bedtime.    levothyroxine (SYNTHROID, LEVOTHROID) 88 MCG tablet Take 1 tablet (88 mcg total) by mouth daily before breakfast. Qty: 90 tablet, Refills: 1    metoprolol tartrate (LOPRESSOR) 25 MG tablet Take 1 tablet (25 mg total) by mouth 2 (two) times daily. Qty: 180 tablet, Refills: 2    omeprazole (PRILOSEC) 40 MG capsule Take 1 capsule (40 mg total) by mouth daily. Qty: 90 capsule, Refills: 3    pravastatin (PRAVACHOL) 40 MG tablet Take 2  tablets (80 mg total) by mouth daily. Qty: 180 tablet, Refills: 1    sitaGLIPtin (JANUVIA) 100 MG tablet Take 1 tablet (100 mg total) by mouth daily. Qty: 90 tablet, Refills: 1    vitamin B-12 (CYANOCOBALAMIN) 1000 MCG tablet Take 1 tablet (1,000 mcg total) by mouth daily. Qty: 90 tablet, Refills: 2    nitroGLYCERIN (NITROSTAT) 0.4 MG SL tablet Place 0.4 mg under the tongue every 5 (five) minutes as needed for chest pain. Reported on 09/27/2015      STOP taking these medications     aspirin 81 MG tablet          Brief H and  P: For complete details please refer to admission H and P, but in brief Patient is a 83 year old male with history of atrial fibrillation, CAD status post CABG presented to the ED after a fall and had multiple facial abrasions. Patient was unaware of why he fell. Patient reported that he was helping his neighbor blow the leaves in the yard and then he may have passed out or tripped. Per his daughter he does have a history of vasovagal episodes. He did not have any bowel or bladder incontinence, no fitness seizure-like activity. CT of the head showed probable small focus of subarachnoid hemorrhage seen in the right posterior parietal sulcus and moderately displaced and comminuted nasal bone fracture. Patient was admitted and Neurosurgery consult was obtained.   Hospital Course:  SAH (subarachnoid hemorrhage) (HCC) - CT head showed Probable small focus of subarachnoid hemorrhage seen in right posterior parietal sulcus. - Neurosurgery was consulted and recommended observation and repeat CT head in 12 hours, Keppra for 7 days - Repeat CT head after 12 hours was done which showed previously noted small focus of apparent subarachnoid hemorrhage along the right posterior parietal sulcus is no longer seen - Hold aspirin, warfarin for now. I discussed with patient's family (3 daughters in the room) regarding anticoagulation, they did not feel comfortable stopping the warfarin permanently given the stroke risk with atrial fibrillation. However they will discuss patient's cardiologist, Dr. Harrington Challenger regarding risks and benefits of anticoagulation given the history of falls, SAH and make decision.    Active Problems: Syncope: Possibly due to vasovagal episode -  I discussed in detail with patient's daughters who reported that patient has a history of vasovagal episodes - Orthostatics were mildly positive, patient was placed on IV fluid hydration.  - carotid Dopplers showed 1-39% stenosis of the right internal  carotid artery and the left internal carotid artery - PT evaluation recommended skilled nursing facility - 2-D echo showed EF of 60-65% with grade 2 diastolic dysfunction - K-93 folate cortisol within normal limits   RIGHT shoulder pain secondary to rotator cuff tear - X-ray of the right humerus showed no fracture or dislocation -  Continue pain control, sling for comfort - CT shoulder consistent with Complete tear of the supraspinatus tendon with mild supraspinatus muscle  - Per orthopedics, Dr. Veverly Fells, sling for support and outpatient follow-up - Patient had cortisone injection done for pain relief on 08/12/16     Hypothyroidism - Continue levothyroxine     DM (diabetes mellitus) with complications ([place]) - Elevated CBGs with hyperglycemia, continue Lantus, Januvia at home. - While inpatient, patient was placed on Lantus, meal coverage and sliding scale insulin.  - Hemoglobin A1c 8.9    Dyslipidemia - Continue Pravachol     HTN (hypertension) - Not on any antihypertensives, BP stable    CAD (coronary artery disease)  of artery bypass graft - Continue statin, hold aspirin,warfarin - Follow up with cardiology outpatient, with Dr. Lovena Le or Dr. Harrington Challenger    Atrial fibrillation [facility]) - Currently rate controlled, continue digoxin, continue to hold warfarin  - Given history of vasovagal episodes, falls, subarachnoid hemorrhage, I recommended to follow-up with cardiology and discuss risk and benefits whether to continue warfarin at this point   mildly comminuted nasal bone fracture - Outpatient follow-up with ENT   Day of Discharge The patient is alert and oriented 3, denies any specific complaints. No chest pain or shortness of breath, no dizziness. No fevers, no acute events overnight  BP 129/64 (BP Location: Left Arm)   Pulse 69   Temp 98.4 F (36.9 C) (Oral)   Resp 16   Ht '5\' 7"'  (1.702 m)   Wt 76.4 kg (168 lb 6.4 oz) Comment: b scale  SpO2 97%   BMI 26.38 kg/m    Physical Exam: General: Alert and awake oriented x3 not in any acute distress. HEENT: anicteric sclera, pupils reactive to light and accommodation, multiple abrasions, ecchymosis on the face CVS: S1-S2 clear no murmur rubs or gallops Chest: clear to auscultation bilaterally, no wheezing rales or rhonchi Abdomen: soft nontender, nondistended, normal bowel sounds Extremities: no cyanosis, clubbing or edema noted bilaterally Neuro: Cranial nerves II-XII intact, no focal neurological deficits   The results of significant diagnostics from this hospitalization (including imaging, microbiology, ancillary and laboratory) are listed below for reference.    LAB RESULTS: Basic Metabolic Panel:  Recent Labs Lab 08/11/16 0417 08/12/16 0536  NA 135 136  K 3.9 4.0  CL 106 106  CO2 22 23  GLUCOSE 190* 168*  BUN 15 13  CREATININE 1.11 1.09  CALCIUM 8.1* 8.1*   Liver Function Tests: No results for input(s): AST, ALT, ALKPHOS, BILITOT, PROT, ALBUMIN in the last 168 hours. No results for input(s): LIPASE, AMYLASE in the last 168 hours. No results for input(s): AMMONIA in the last 168 hours. CBC:  Recent Labs Lab 08/08/16 1410  08/11/16 0417 08/12/16 0536  WBC 10.8*  < > 7.4 6.0  NEUTROABS 9.3*  --   --   --   HGB 13.8  < > 11.6* 11.5*  HCT 41.2  < > 35.2* 35.3*  MCV 95.2  < > 95.9 95.7  PLT 150  < > 124* 133*  < > = values in this interval not displayed. Cardiac Enzymes:  Recent Labs Lab 08/09/16 0000 08/09/16 0621  TROPONINI <0.03 <0.03   BNP: Invalid input(s): POCBNP CBG:  Recent Labs Lab 08/12/16 2134 08/13/16 0756  GLUCAP 272* 231*    Significant Diagnostic Studies:  Dg Wrist Complete Left  Result Date: 08/08/2016 CLINICAL DATA:  Status post fall today and complains of mid to distal right arm pain. No definite wrist pain. The patient does not were called the particulars of the fall. EXAM: LEFT WRIST - COMPLETE 3+ VIEW COMPARISON:  None in PACs FINDINGS: The  bones of the left wrist are subjectively adequately mineralized. The distal radius and ulna are intact. There is mild narrowing of the radiocarpal joint. There is moderate severe narrowing of the first [facility] joint. No acute carpal bone fracture is observed. The metacarpals are intact where visualized. IMPRESSION: There are degenerative changes of the left wrist centered on the radiocarpal joint and first CMC joint. There is no acute fracture nor dislocation. Electronically Signed   By: David  [name] M.D.   On: 08/08/2016 13:57   Ct Head Wo  Contrast  Result Date: 08/09/2016 CLINICAL DATA:  Question of subarachnoid hemorrhage at the right posterior parietal sulcus. Initial encounter. EXAM: CT HEAD WITHOUT CONTRAST TECHNIQUE: Contiguous axial images were obtained from the base of the skull through the vertex without intravenous contrast. COMPARISON:  CT of the head performed 08/08/2016 FINDINGS: Brain: The previously noted small focus of apparent subarachnoid hemorrhage along the right posterior parietal sulcus is no longer seen. Prominence of the ventricles and sulci reflects mild to moderate cortical volume loss. Mild cerebellar atrophy is noted. Scattered periventricular and subcortical white matter change likely reflects small vessel ischemic microangiopathy. Chronic lacunar infarcts are noted at the left basal ganglia. The brainstem and fourth ventricle are within normal limits. The cerebral hemispheres demonstrate grossly normal gray-white differentiation. No mass effect or midline shift is seen. Vascular: No hyperdense vessel or unexpected calcification. Skull: A mildly comminuted fracture of the nasal bone is again noted, with rightward displacement. Sinuses/Orbits: The visualized portions of the orbits are within normal limits. The paranasal sinuses and mastoid air cells are well-aerated. Other: Mild soft tissue swelling is noted overlying the left frontal calvarium. IMPRESSION: 1. Previously noted small  focus of apparent subarachnoid hemorrhage along the right posterior parietal sulcus is no longer seen. 2. Mild soft tissue swelling overlying the left frontal calvarium. 3. Mildly comminuted fracture of the nasal bone, with rightward displacement. 4. Mild to moderate cortical volume loss and scattered small vessel ischemic microangiopathy. Chronic lacunar infarcts at the left basal ganglia. Electronically Signed   By: Garald Balding M.D.   On: 08/09/2016 03:56   Ct Head Wo Contrast  Result Date: 08/08/2016 CLINICAL DATA:  Head and neck pain and facial injuries after fall. EXAM: CT HEAD WITHOUT CONTRAST CT MAXILLOFACIAL WITHOUT CONTRAST CT CERVICAL SPINE WITHOUT CONTRAST TECHNIQUE: Multidetector CT imaging of the head, cervical spine, and maxillofacial structures were performed using the standard protocol without intravenous contrast. Multiplanar CT image reconstructions of the cervical spine and maxillofacial structures were also generated. COMPARISON:  CT scan of May 22, 2007. FINDINGS: CT HEAD FINDINGS Brain: Mild chronic ischemic white matter disease is noted. Mild diffuse cortical atrophy is noted. No mass effect or midline shift is noted. Ventricular size is within normal limits. There is no evidence of mass lesion or acute infarction. Small high density focus is seen in right posterior parietal sulcus concerning for small focus of subarachnoid hemorrhage. Vascular: No hyperdense vessel or unexpected calcification. Skull: Normal. Negative for fracture or focal lesion. Other: Old lacunar infarctions are noted in both basal ganglia bilaterally. Small left frontal scalp hematoma is noted. CT MAXILLOFACIAL FINDINGS Osseous: Moderately displaced and comminuted nasal bone fracture is noted, which is displaced to the right. Orbits: Negative. No traumatic or inflammatory finding. Sinuses: Right ethmoid sinusitis is noted. Bilateral maxillary sinusitis is noted. Soft tissues: Negative. CT CERVICAL SPINE FINDINGS  Alignment: Normal. Skull base and vertebrae: No acute fracture. No primary bone lesion or focal pathologic process. Soft tissues and spinal canal: No prevertebral fluid or swelling. No visible canal hematoma. Disc levels: Severe degenerative disc disease is noted at C3-4, C4-5, C5-6 and C6-7 with anterior and posterior osteophyte formation. Upper chest: Negative. Other: None. IMPRESSION: Mild chronic ischemic white matter disease. Mild diffuse cortical atrophy. Small left frontal scalp hematoma. Probable small focus of subarachnoid hemorrhage seen in right posterior parietal sulcus. Moderately displaced and comminuted nasal bone fracture is noted. Severe multilevel degenerative disc disease is noted in the cervical spine. No fracture or spondylolisthesis is noted. Critical Value/emergent results  were called by telephone at the time of interpretation on 08/08/2016 at 2:12 pm to Dr. Dalia Heading , who verbally acknowledged these results. Electronically Signed   By: Marijo Conception, M.D.   On: 08/08/2016 14:13   Ct Cervical Spine Wo Contrast  Result Date: 08/08/2016 CLINICAL DATA:  Head and neck pain and facial injuries after fall. EXAM: CT HEAD WITHOUT CONTRAST CT MAXILLOFACIAL WITHOUT CONTRAST CT CERVICAL SPINE WITHOUT CONTRAST TECHNIQUE: Multidetector CT imaging of the head, cervical spine, and maxillofacial structures were performed using the standard protocol without intravenous contrast. Multiplanar CT image reconstructions of the cervical spine and maxillofacial structures were also generated. COMPARISON:  CT scan of May 22, 2007. FINDINGS: CT HEAD FINDINGS Brain: Mild chronic ischemic white matter disease is noted. Mild diffuse cortical atrophy is noted. No mass effect or midline shift is noted. Ventricular size is within normal limits. There is no evidence of mass lesion or acute infarction. Small high density focus is seen in right posterior parietal sulcus concerning for small focus of  subarachnoid hemorrhage. Vascular: No hyperdense vessel or unexpected calcification. Skull: Normal. Negative for fracture or focal lesion. Other: Old lacunar infarctions are noted in both basal ganglia bilaterally. Small left frontal scalp hematoma is noted. CT MAXILLOFACIAL FINDINGS Osseous: Moderately displaced and comminuted nasal bone fracture is noted, which is displaced to the right. Orbits: Negative. No traumatic or inflammatory finding. Sinuses: Right ethmoid sinusitis is noted. Bilateral maxillary sinusitis is noted. Soft tissues: Negative. CT CERVICAL SPINE FINDINGS Alignment: Normal. Skull base and vertebrae: No acute fracture. No primary bone lesion or focal pathologic process. Soft tissues and spinal canal: No prevertebral fluid or swelling. No visible canal hematoma. Disc levels: Severe degenerative disc disease is noted at C3-4, C4-5, C5-6 and C6-7 with anterior and posterior osteophyte formation. Upper chest: Negative. Other: None. IMPRESSION: Mild chronic ischemic white matter disease. Mild diffuse cortical atrophy. Small left frontal scalp hematoma. Probable small focus of subarachnoid hemorrhage seen in right posterior parietal sulcus. Moderately displaced and comminuted nasal bone fracture is noted. Severe multilevel degenerative disc disease is noted in the cervical spine. No fracture or spondylolisthesis is noted. Critical Value/emergent results were called by telephone at the time of interpretation on 08/08/2016 at 2:12 pm to Dr. Dalia Heading , who verbally acknowledged these results. Electronically Signed   By: Marijo Conception, M.D.   On: 08/08/2016 14:13   Dg Humerus Right  Result Date: 08/08/2016 CLINICAL DATA:  Right mid to distal arm pain following a fall today. EXAM: RIGHT HUMERUS - 2+ VIEW COMPARISON:  Right shoulder series of March 15, 2013. FINDINGS: The humerus is subjectively adequately mineralized. The observed portions of the shoulder and elbow are intact. There is  narrowing of the subacromial subdeltoid space which appears chronic when compared to the previous study. The soft tissues of the arm are unremarkable. IMPRESSION: There is no acute bony abnormality of the right humerus. Electronically Signed   By: David  [name] M.D.   On: 08/08/2016 14:00   Ct Maxillofacial Wo Cm  Result Date: 08/08/2016 CLINICAL DATA:  Head and neck pain and facial injuries after fall. EXAM: CT HEAD WITHOUT CONTRAST CT MAXILLOFACIAL WITHOUT CONTRAST CT CERVICAL SPINE WITHOUT CONTRAST TECHNIQUE: Multidetector CT imaging of the head, cervical spine, and maxillofacial structures were performed using the standard protocol without intravenous contrast. Multiplanar CT image reconstructions of the cervical spine and maxillofacial structures were also generated. COMPARISON:  CT scan of May 22, 2007. FINDINGS: CT HEAD FINDINGS Brain: Mild  chronic ischemic white matter disease is noted. Mild diffuse cortical atrophy is noted. No mass effect or midline shift is noted. Ventricular size is within normal limits. There is no evidence of mass lesion or acute infarction. Small high density focus is seen in right posterior parietal sulcus concerning for small focus of subarachnoid hemorrhage. Vascular: No hyperdense vessel or unexpected calcification. Skull: Normal. Negative for fracture or focal lesion. Other: Old lacunar infarctions are noted in both basal ganglia bilaterally. Small left frontal scalp hematoma is noted. CT MAXILLOFACIAL FINDINGS Osseous: Moderately displaced and comminuted nasal bone fracture is noted, which is displaced to the right. Orbits: Negative. No traumatic or inflammatory finding. Sinuses: Right ethmoid sinusitis is noted. Bilateral maxillary sinusitis is noted. Soft tissues: Negative. CT CERVICAL SPINE FINDINGS Alignment: Normal. Skull base and vertebrae: No acute fracture. No primary bone lesion or focal pathologic process. Soft tissues and spinal canal: No prevertebral fluid or  swelling. No visible canal hematoma. Disc levels: Severe degenerative disc disease is noted at C3-4, C4-5, C5-6 and C6-7 with anterior and posterior osteophyte formation. Upper chest: Negative. Other: None. IMPRESSION: Mild chronic ischemic white matter disease. Mild diffuse cortical atrophy. Small left frontal scalp hematoma. Probable small focus of subarachnoid hemorrhage seen in right posterior parietal sulcus. Moderately displaced and comminuted nasal bone fracture is noted. Severe multilevel degenerative disc disease is noted in the cervical spine. No fracture or spondylolisthesis is noted. Critical Value/emergent results were called by telephone at the time of interpretation on 08/08/2016 at 2:12 pm to Dr. Dalia Heading , who verbally acknowledged these results. Electronically Signed   By: Marijo Conception, M.D.   On: 08/08/2016 14:13    2D ECHO: Study Conclusions  - Left ventricle: The cavity size was normal. There was moderate   concentric hypertrophy. Systolic function was normal. The   estimated ejection fraction was in the range of 60% to 65%. Wall   motion was normal; there were no regional wall motion   abnormalities. Features are consistent with a pseudonormal left   ventricular filling pattern, with concomitant abnormal relaxation   and increased filling pressure (grade 2 diastolic dysfunction).   Doppler parameters are consistent with elevated ventricular   end-diastolic filling pressure. - Aortic valve: There was mild regurgitation. - Mitral valve: Structurally normal valve. There was mild   regurgitation. - Left atrium: The atrium was moderately dilated. - Right ventricle: Pacer wire or catheter noted in right ventricle.   Systolic function was normal. - Right atrium: The atrium was mildly dilated. Pacer wire or   catheter noted in right atrium. - Tricuspid valve: There was mild regurgitation. - Pulmonary arteries: Systolic pressure was mildly increased. PA   peak  pressure: 39 mm Hg (S). - Inferior vena cava: The vessel was normal in size.  Disposition and Follow-up: Discharge Instructions    Diet Carb Modified    Complete by:  As directed    Increase activity slowly    Complete by:  As directed        DISPOSITION: SNF  DISCHARGE FOLLOW-UP  Contact information for follow-up providers    Kathlene November, MD. Schedule an appointment as soon as possible for a visit in 10 day(s).   Specialty:  Internal Medicine Contact information: [address] [place] [address] [phone]        Augustin Schooling, MD. Schedule an appointment as soon as possible for a visit in 2 week(s).   Specialty:  Orthopedic Surgery Why:  follow-up  for your right shoulder  Contact information: [address] [place] [address] [phone]        Kevan Ny Ditty, MD. Schedule an appointment as soon as possible for a visit in 1 week(s).   Specialty:  Neurosurgery Contact information: [address] [place] [address] [address] [phone]        Dorris Carnes, MD. Schedule an appointment as soon as possible for a visit in 1 week(s).   Specialty:  Cardiology Why:  Please discuss about aspirin and warfarin  Contact information: [place] [address] [phone]        SHOEMAKER, DAVID, MD. Schedule an appointment as soon as possible for a visit in 2 week(s).   Specialty:  Otolaryngology Why:  for your nasal bone fracture Contact information: [address] [place] [address] [phone]            Contact information for after-discharge care    Destination    [facility] Follow up.   Specialty:  Skilled Nursing Facility Contact information: [address] [place] [phone]                   Time spent on Discharge: 44mns   Signed:   Denetra Formoso M.D. [facility] 08/13/2016, 11:34 AM Pager: [phone]

## 2016-08-13 NOTE — Telephone Encounter (Signed)
Pt daughter calling, states that Dr. Jeanell Sparrow from [facility] would like to verify if patient should continue to take both aspirin and coumadin medications. Patient fell and has a significant amount of bleeding.patient would also like to know if he needs to come in for a pace maker check.  Please call to discuss with patient. Thanks.

## 2016-08-13 NOTE — Clinical Social Work Note (Signed)
Insurance authorization approved: [id number]. SNF notified and will let CSW know when he can transport. Per RN, patient's family will transport by car.  [name], [place]

## 2016-08-13 NOTE — Progress Notes (Signed)
Inpatient Diabetes Program Recommendations  AACE/ADA: New Consensus Statement on Inpatient Glycemic Control (2015)  Target Ranges:  Prepandial:   less than 140 mg/dL      Peak postprandial:   less than 180 mg/dL (1-2 hours)      Critically ill patients:  140 - 180 mg/dL   Lab Results  Component Value Date   GLUCAP 231 (H) 08/13/2016   HGBA1C 8.9 (H) 08/09/2016     Review of Glycemic Control  Diabetes history: DM2  Outpatient Diabetes medications: Lantus 50 units daily + Januvia 100 mg daily  Current orders for Inpatient glycemic control: Lantus 40 units daily, Novolog 0-9 units tid and 0-5 units hs, meal coverage of Novolog 5 units TIDAC if patient eats > 50% of meal  Inpatient Diabetes Program Recommendations:   Insulin - Meal Coverage: Please consider increasing meal coverage to Novolog 7 units TID AC if eats > 50% of meal.  Insulin - Basal: Please consider increasing to Lantus 45 daily.  Thank you,  Windy Carina, RN, MSN Diabetes Coordinator Inpatient Diabetes Program [phone] (Team Pager)

## 2016-08-13 NOTE — Clinical Social Work Placement (Signed)
   CLINICAL SOCIAL WORK PLACEMENT  NOTE  Date:  08/13/2016  Patient Details  Name: [name] MRN: [id number] Date of Birth: [birth year]  Clinical Social Work is seeking post-discharge placement for this patient at the [place] level of care (*CSW will initial, date and re-position this form in  chart as items are completed):  Yes   Patient/family provided with [facility] Work Department's list of facilities offering this level of care within the geographic area requested by the patient (or if unable, by the patient's family).  Yes   Patient/family informed of their freedom to choose among providers that offer the needed level of care, that participate in Medicare, Medicaid or managed care program needed by the patient, have an available bed and are willing to accept the patient.  Yes   Patient/family informed of [facility]'s ownership interest in [facility] and [facility], as well as of the fact that they are under no obligation to receive care at these facilities.  PASRR submitted to EDS on 08/11/16     PASRR number received on 08/11/16     Existing PASRR number confirmed on       FL2 transmitted to all facilities in geographic area requested by pt/family on 08/11/16     FL2 transmitted to all facilities within larger geographic area on       Patient informed that his/her managed care company has contracts with or will negotiate with certain facilities, including the following:        Yes   Patient/family informed of bed offers received.  Patient chooses bed at [facility]     Physician recommends and patient chooses bed at      Patient to be transferred to [facility] on 08/13/16.  Patient to be transferred to facility by Patient's daughter to transport by car     Patient family notified on 08/13/16 of transfer.  Name of family member notified:  Marlane Mingle     PHYSICIAN       Additional  Comment:    _______________________________________________ Candie Chroman, LCSW 08/13/2016, 11:28 AM

## 2016-08-13 NOTE — Telephone Encounter (Signed)
I placed call to call back number listed. No answer. No voicemail set up.

## 2016-08-13 NOTE — Progress Notes (Signed)
PT Cancellation Note  Patient Details Name: [name] MRN: [id number] DOB: 1925-06-30   Cancelled Treatment:     Attempted to see pt this morning, he declined.  Re-attempted to see pt at noon. He again declined therapy.  Upon third attempt this afternoon, pt discharged from hospital.   [place] 08/13/2016, 5:24 PM

## 2016-08-13 NOTE — Progress Notes (Addendum)
: Provider:  Noah Delaine. Sheppard Coil, MD Location:  [facility] Room Number: 103 Place of Service:  SNF ([phone])  PCP: Kathlene November, MD Patient Care Team: Colon Branch, MD as PCP - General  Extended Emergency Contact Information Primary Emergency Contact: Rush,Leigh Address: [place], [place] Phone: [phone] Relation: Daughter Secondary Emergency Contact: Ferran,Tami  [place] Mobile Phone: [phone] Relation: None     Allergies: Ace inhibitors; Codeine phosphate; and Hydrochlorothiazide w-triamterene  Chief Complaint  Patient presents with  . New Admit To SNF    Admit to Facility    HPI: Patient is 83 y.o. male with atrial fibrillation, CAD status post CABG presented to the ED after a fall and had multiple facial abrasions. Patient was unaware of why he fell. Patient reported that he was helping his neighbor blow the leaves in the yard and then he may have passed out or tripped. Per his daughter he does have a history of vasovagal episodes. He did not have any bowel or bladder incontinence, no fitness seizure-like activity. CT of the head showed probable small focus of subarachnoid hemorrhage seen in the right posterior parietal sulcus and moderately displaced and comminuted nasal bone fracture. Patient was admitted to [facility] from 3/29-4/2 and Neurosurgery consult was obtained. They put pt on Keppra for 7 days and stopped ASA and warfarin.. Pt was found to have a R rotator cuff tear , tx with sling and PT. Pt was worked up for syncopal episode which was deemed vasovagal. Pt's hemorrhage did not enlarge and pt is admitted to SNF for OT/PT. While at SNF pt will be followed by CAD, tx with statin and metoprolol, ASA and coumadin being held. AF, tx with metoprolol and digoxin, ASA and warfarin being held and HLD, tx with pravachol.  Past Medical History:  Diagnosis Date  . AAA (abdominal aortic aneurysm)  ([place])   . Anemia due to chronic blood loss 11/25/2008   Recurrent over the years EGD and colonoscopy x 2 each 2004 and 2010 without cause    . Atrial fibrillation ([place])   . BPH (benign prostatic hyperplasia)    reports aprocedure (TURP) remotely in [place].Marland KitchenNo futher f/u w/ urology  . CAD (coronary artery disease)   . Diabetes mellitus, type 2 ([place])   . Diverticulosis    left colon  . Erosive gastritis   . Fall 08/08/2016   OUTSIDE AT HOME FACIAL TRAUMA   . GERD (gastroesophageal reflux disease)   . Hyperlipidemia   . Hypertension   . Hypothyroidism   . Insomnia    transient  . Osteopenia    dexa 2-11  . Vitamin B12 deficiency     Past Surgical History:  Procedure Laterality Date  . CARDIAC CATHETERIZATION  01/31/06, 09/25/10, 09-2011  . CATARACT EXTRACTION     right  . CHOLECYSTECTOMY, LAPAROSCOPIC    . COLONOSCOPY     multiple  . CORONARY ARTERY BYPASS GRAFT     1999 stents in 2000  . ESOPHAGOGASTRODUODENOSCOPY     multiple  . GIVENS CAPSULE STUDY N/A 11/04/2012   Procedure: GIVENS CAPSULE STUDY;  Surgeon: Gatha Mayer, MD;  Location: [facility] ENDOSCOPY;  Service: Endoscopy;  Laterality: N/A;  . inguinal herniorrhaphies     bilateral  . LEFT HEART CATHETERIZATION WITH CORONARY ANGIOGRAM N/A 10/04/2011   Procedure: LEFT HEART CATHETERIZATION WITH CORONARY ANGIOGRAM;  Surgeon: Annita Brod  Angelena Form, MD;  Location: [facility] CATH LAB;  Service: Cardiovascular;  Laterality: N/A;  . pacemaker  [place], 2013   medtronic minix [id number]  . PENILE PROSTHESIS IMPLANT  1992  . PERMANENT PACEMAKER GENERATOR CHANGE N/A 06/05/2011   Procedure: PERMANENT PACEMAKER GENERATOR CHANGE;  Surgeon: Evans Lance, MD;  Location: [facility] CATH LAB;  Service: Cardiovascular;  Laterality: N/A;  . PROSTATECTOMY     transurethral  . right hip replacement    . TRANSTHORACIC ECHOCARDIOGRAM  12/2006    Allergies as of 08/13/2016      Reactions   Ace Inhibitors Cough   Codeine Phosphate Nausea Only   Hydrochlorothiazide  W-triamterene Other (See Comments)   CRAMPING      Medication List    Notice   This visit is during an admission. Changes to the med list made in this visit will be reflected in the After Visit Summary of the admission.     Meds ordered this encounter  Medications  . insulin glargine (LANTUS) 100 UNIT/ML injection    Sig: Inject 50 Units into the skin at bedtime.  . metoprolol tartrate (LOPRESSOR) 25 MG tablet    Sig: Take 25 mg by mouth 2 (two) times daily.    Immunization History  Administered Date(s) Administered  . H1N1 06/14/2008  . Influenza Split 03/01/2011  . Influenza Whole 03/31/2007, 02/04/2008, 02/03/2009, 04/13/2010  . Influenza, High Dose Seasonal PF 03/15/2013, 04/18/2016  . Influenza,inj,Quad PF,36+ Mos 02/28/2014  . Pneumococcal Conjugate-13 03/02/2014  . Pneumococcal Polysaccharide-23 03/31/2007  . Td 12/15/2007    Social History  Substance Use Topics  . Smoking status: Never Smoker  . Smokeless tobacco: Never Used  . Alcohol use 0.6 - 1.2 oz/week    1 - 2 Glasses of wine per week     Comment: socially     Family history is   Family History  Problem Relation Age of Onset  . Alzheimer's disease Mother   . Heart failure Father   . CVA Father   . Coronary artery disease Brother     cabg  . Alzheimer's disease Sister   . Mental illness Sister   . Alzheimer's disease Sister   . Other Sister     lung problems  . Diabetes Other     several siblings  . Prostate cancer Neg Hx   . Colon cancer Neg Hx   . Esophageal cancer Neg Hx   . Stomach cancer Neg Hx   . Rectal cancer Neg Hx       Review of Systems  DATA OBTAINED: from patient,  family member GENERAL:  no fevers, fatigue, appetite changes SKIN: No itching, or rash EYES: No eye pain, redness, discharge EARS: No earache, tinnitus, change in hearing NOSE: No congestion, drainage or bleeding  MOUTH/THROAT: No mouth or tooth pain, No sore throat RESPIRATORY: No cough, wheezing,  SOB CARDIAC: No chest pain, palpitations, lower extremity edema  GI: No abdominal pain, No N/V/D or constipation, No heartburn or reflux  GU: No dysuria, frequency or urgency, or incontinence  MUSCULOSKELETAL: No unrelieved bone/joint pain NEUROLOGIC: No headache, dizziness or focal weakness PSYCHIATRIC: No c/o anxiety or sadness   Vitals:   08/13/16 1514  BP: (!) 146/76  Pulse: 63  Resp: 18  Temp: 97 F (36.1 C)    SpO2 Readings from Last 1 Encounters:  08/13/16 97%   Body mass index is 26.69 kg/m.     Physical Exam  GENERAL APPEARANCE: Alert, conversant,  No acute distress.  SKIN: No diaphoressis HEAD: Normocephalic, + trauma- mutiple laceration , scrapes and bruising EYES: Conjunctiva/lids clear. Pupils round, reactive. EOMs intact.  EARS: External exam WNL, canals clear. Hearing grossly normal.  NOSE: No deformity or discharge.  MOUTH/THROAT: Lips w/o lesions  RESPIRATORY: Breathing is even, unlabored. Lung sounds are clear   CARDIOVASCULAR: Heart RRR no murmurs, rubs or gallops. No peripheral edema.   GASTROINTESTINAL: Abdomen is soft, non-tender, not distended w/ normal bowel sounds. GENITOURINARY: Bladder non tender, not distended  MUSCULOSKELETAL: No abnormal joints or musculature NEUROLOGIC:  Cranial nerves 2-12 grossly intact. Moves all extremities  PSYCHIATRIC: Mood and affect appropriate to situation, no behavioral issues  Patient Active Problem List   Diagnosis Date Noted  . Rotator cuff tear, right 08/18/2016  . HLD (hyperlipidemia) 08/18/2016  . Nasal bone fracture 08/18/2016  . Syncope and collapse 08/08/2016  . SAH (subarachnoid hemorrhage) ([place]) 08/08/2016  . PCP NOTES >>>>>>>>>>>>>>>>>>>> 09/28/2015  . Vasomotor rhinitis 09/14/2014  . Cough 03/01/2014  . Right shoulder injury 03/15/2013  . Hiatal hernia 01/28/2013  . Erosive gastritis 01/28/2013  . Warfarin anticoagulation 01/28/2013  . Medicare annual wellness visit, subsequent 07/14/2012   . BPH (benign prostatic hyperplasia) 06/07/2009  . Osteopenia 06/07/2009  . Anemia due to chronic blood loss 11/25/2008  . Vitamin B 12 deficiency 06/01/2008  . HTN (hypertension) 06/01/2008  . PACEMAKER, PERMANENT 06/01/2008  . CORONARY ARTERY BYPASS GRAFT, HX OF 06/01/2008  . GERD 03/31/2007  . Hypothyroidism 01/26/2007  . DM (diabetes mellitus) with complications ([place]) 45/80/9983  . Atrial fibrillation ([place]) 01/26/2007  . Dyslipidemia 10/30/2006  . CAD (coronary artery disease) of artery bypass graft 10/02/2006  . HIP REPLACEMENT, RIGHT, HX OF 02/04/2003      Labs reviewed: Basic Metabolic Panel:    Component Value Date/Time   NA 136 08/12/2016 0536   NA 136 (A) 08/12/2016   K 4.0 08/12/2016 0536   CL 106 08/12/2016 0536   CO2 23 08/12/2016 0536   GLUCOSE 168 (H) 08/12/2016 0536   GLUCOSE 191 (H) 05/07/2006 1348   BUN 13 08/12/2016 0536   BUN 13 08/12/2016   CREATININE 1.09 08/12/2016 0536   CREATININE 1.40 (H) 10/19/2010 1706   CALCIUM 8.1 (L) 08/12/2016 0536   PROT 6.1 09/27/2015 1232   ALBUMIN 3.8 09/27/2015 1232   AST 12 09/27/2015 1232   ALT 13 09/27/2015 1232   ALKPHOS 37 (L) 09/27/2015 1232   BILITOT 0.6 09/27/2015 1232   GFRNONAA 59 (L) 08/12/2016 0536   GFRAA >60 08/12/2016 0536     Recent Labs  08/10/16 0255 08/11/16 08/11/16 0417 08/12/16 08/12/16 0536  NA 137 135* 135 136* 136  K 3.9 3.9 3.9  --  4.0  CL 106  --  106  --  106  CO2 23  --  22  --  23  GLUCOSE 176*  --  190*  --  168*  BUN 21* 15 15 13 13   CREATININE 1.19 1.1 1.11 1.1 1.09  CALCIUM 8.3*  --  8.1*  --  8.1*   Liver Function Tests:  Recent Labs  09/27/15 1232  AST 12  ALT 13  ALKPHOS 37*  BILITOT 0.6  PROT 6.1  ALBUMIN 3.8   No results for input(s): LIPASE, AMYLASE in the last 8760 hours. No results for input(s): AMMONIA in the last 8760 hours. CBC:  Recent Labs  09/27/15 1232 06/18/16 1458  08/08/16 1410  08/10/16 0255 08/11/16 08/11/16 0417 08/12/16  08/12/16 0536  WBC 7.0 6.3  < >  10.8*  < > 7.7 7.4 7.4 6.0 6.0  NEUTROABS 5.1 4.4  --  9.3*  --   --   --   --   --   --   HGB 13.8 14.5  < > 13.8  < > 11.8* 11.6* 11.6*  --  11.5*  HCT 39.8 43.8  < > 41.2  < > 35.1* 35* 35.2*  --  35.3*  MCV 95.9 96.0  --  95.2  < > 95.9  --  95.9  --  95.7  PLT 178.0 185.0  < > 150  < > 117* 124* 124*  --  133*  < > = values in this interval not displayed. Lipid  Recent Labs  09/27/15 1232  CHOL 144  HDL 20.50*  TRIG 411.0 Triglyceride is over 400; calculations on Lipids are invalid.*    Cardiac Enzymes:  Recent Labs  08/08/16 1847 08/09/16 0000 08/09/16 0621  TROPONINI <0.03 <0.03 <0.03   BNP: No results for input(s): BNP in the last 8760 hours. Lab Results  Component Value Date   MICROALBUR <0.7 12/28/2015   Lab Results  Component Value Date   HGBA1C 8.9 (H) 08/09/2016   Lab Results  Component Value Date   TSH 0.73 06/18/2016   Lab Results  Component Value Date   VITAMINB12 489 08/11/2016   Lab Results  Component Value Date   FOLATE 13.8 08/11/2016   Lab Results  Component Value Date   IRON 35 (L) 07/14/2012   TIBC 502 (H) 11/25/2008   FERRITIN 139.8 03/15/2013    Imaging and Procedures obtained prior to SNF admission: Dg Wrist Complete Left  Result Date: 08/08/2016 CLINICAL DATA:  Status post fall today and complains of mid to distal right arm pain. No definite wrist pain. The patient does not were called the particulars of the fall. EXAM: LEFT WRIST - COMPLETE 3+ VIEW COMPARISON:  None in PACs FINDINGS: The bones of the left wrist are subjectively adequately mineralized. The distal radius and ulna are intact. There is mild narrowing of the radiocarpal joint. There is moderate severe narrowing of the first [facility] joint. No acute carpal bone fracture is observed. The metacarpals are intact where visualized. IMPRESSION: There are degenerative changes of the left wrist centered on the radiocarpal joint and first [facility] joint.  There is no acute fracture nor dislocation. Electronically Signed   By: David  [name] M.D.   On: 08/08/2016 13:57   Ct Head Wo Contrast  Result Date: 08/09/2016 CLINICAL DATA:  Question of subarachnoid hemorrhage at the right posterior parietal sulcus. Initial encounter. EXAM: CT HEAD WITHOUT CONTRAST TECHNIQUE: Contiguous axial images were obtained from the base of the skull through the vertex without intravenous contrast. COMPARISON:  CT of the head performed 08/08/2016 FINDINGS: Brain: The previously noted small focus of apparent subarachnoid hemorrhage along the right posterior parietal sulcus is no longer seen. Prominence of the ventricles and sulci reflects mild to moderate cortical volume loss. Mild cerebellar atrophy is noted. Scattered periventricular and subcortical white matter change likely reflects small vessel ischemic microangiopathy. Chronic lacunar infarcts are noted at the left basal ganglia. The brainstem and fourth ventricle are within normal limits. The cerebral hemispheres demonstrate grossly normal gray-white differentiation. No mass effect or midline shift is seen. Vascular: No hyperdense vessel or unexpected calcification. Skull: A mildly comminuted fracture of the nasal bone is again noted, with rightward displacement. Sinuses/Orbits: The visualized portions of the orbits are within normal limits. The paranasal sinuses and mastoid air cells  are well-aerated. Other: Mild soft tissue swelling is noted overlying the left frontal calvarium. IMPRESSION: 1. Previously noted small focus of apparent subarachnoid hemorrhage along the right posterior parietal sulcus is no longer seen. 2. Mild soft tissue swelling overlying the left frontal calvarium. 3. Mildly comminuted fracture of the nasal bone, with rightward displacement. 4. Mild to moderate cortical volume loss and scattered small vessel ischemic microangiopathy. Chronic lacunar infarcts at the left basal ganglia. Electronically Signed    By: Garald Balding M.D.   On: 08/09/2016 03:56   Ct Head Wo Contrast  Result Date: 08/08/2016 CLINICAL DATA:  Head and neck pain and facial injuries after fall. EXAM: CT HEAD WITHOUT CONTRAST CT MAXILLOFACIAL WITHOUT CONTRAST CT CERVICAL SPINE WITHOUT CONTRAST TECHNIQUE: Multidetector CT imaging of the head, cervical spine, and maxillofacial structures were performed using the standard protocol without intravenous contrast. Multiplanar CT image reconstructions of the cervical spine and maxillofacial structures were also generated. COMPARISON:  CT scan of May 22, 2007. FINDINGS: CT HEAD FINDINGS Brain: Mild chronic ischemic white matter disease is noted. Mild diffuse cortical atrophy is noted. No mass effect or midline shift is noted. Ventricular size is within normal limits. There is no evidence of mass lesion or acute infarction. Small high density focus is seen in right posterior parietal sulcus concerning for small focus of subarachnoid hemorrhage. Vascular: No hyperdense vessel or unexpected calcification. Skull: Normal. Negative for fracture or focal lesion. Other: Old lacunar infarctions are noted in both basal ganglia bilaterally. Small left frontal scalp hematoma is noted. CT MAXILLOFACIAL FINDINGS Osseous: Moderately displaced and comminuted nasal bone fracture is noted, which is displaced to the right. Orbits: Negative. No traumatic or inflammatory finding. Sinuses: Right ethmoid sinusitis is noted. Bilateral maxillary sinusitis is noted. Soft tissues: Negative. CT CERVICAL SPINE FINDINGS Alignment: Normal. Skull base and vertebrae: No acute fracture. No primary bone lesion or focal pathologic process. Soft tissues and spinal canal: No prevertebral fluid or swelling. No visible canal hematoma. Disc levels: Severe degenerative disc disease is noted at C3-4, C4-5, C5-6 and C6-7 with anterior and posterior osteophyte formation. Upper chest: Negative. Other: None. IMPRESSION: Mild chronic ischemic white  matter disease. Mild diffuse cortical atrophy. Small left frontal scalp hematoma. Probable small focus of subarachnoid hemorrhage seen in right posterior parietal sulcus. Moderately displaced and comminuted nasal bone fracture is noted. Severe multilevel degenerative disc disease is noted in the cervical spine. No fracture or spondylolisthesis is noted. Critical Value/emergent results were called by telephone at the time of interpretation on 08/08/2016 at 2:12 pm to Dr. Dalia Heading , who verbally acknowledged these results. Electronically Signed   By: Marijo Conception, M.D.   On: 08/08/2016 14:13   Ct Cervical Spine Wo Contrast  Result Date: 08/08/2016 CLINICAL DATA:  Head and neck pain and facial injuries after fall. EXAM: CT HEAD WITHOUT CONTRAST CT MAXILLOFACIAL WITHOUT CONTRAST CT CERVICAL SPINE WITHOUT CONTRAST TECHNIQUE: Multidetector CT imaging of the head, cervical spine, and maxillofacial structures were performed using the standard protocol without intravenous contrast. Multiplanar CT image reconstructions of the cervical spine and maxillofacial structures were also generated. COMPARISON:  CT scan of May 22, 2007. FINDINGS: CT HEAD FINDINGS Brain: Mild chronic ischemic white matter disease is noted. Mild diffuse cortical atrophy is noted. No mass effect or midline shift is noted. Ventricular size is within normal limits. There is no evidence of mass lesion or acute infarction. Small high density focus is seen in right posterior parietal sulcus concerning for small focus of  subarachnoid hemorrhage. Vascular: No hyperdense vessel or unexpected calcification. Skull: Normal. Negative for fracture or focal lesion. Other: Old lacunar infarctions are noted in both basal ganglia bilaterally. Small left frontal scalp hematoma is noted. CT MAXILLOFACIAL FINDINGS Osseous: Moderately displaced and comminuted nasal bone fracture is noted, which is displaced to the right. Orbits: Negative. No traumatic or  inflammatory finding. Sinuses: Right ethmoid sinusitis is noted. Bilateral maxillary sinusitis is noted. Soft tissues: Negative. CT CERVICAL SPINE FINDINGS Alignment: Normal. Skull base and vertebrae: No acute fracture. No primary bone lesion or focal pathologic process. Soft tissues and spinal canal: No prevertebral fluid or swelling. No visible canal hematoma. Disc levels: Severe degenerative disc disease is noted at C3-4, C4-5, C5-6 and C6-7 with anterior and posterior osteophyte formation. Upper chest: Negative. Other: None. IMPRESSION: Mild chronic ischemic white matter disease. Mild diffuse cortical atrophy. Small left frontal scalp hematoma. Probable small focus of subarachnoid hemorrhage seen in right posterior parietal sulcus. Moderately displaced and comminuted nasal bone fracture is noted. Severe multilevel degenerative disc disease is noted in the cervical spine. No fracture or spondylolisthesis is noted. Critical Value/emergent results were called by telephone at the time of interpretation on 08/08/2016 at 2:12 pm to Dr. Dalia Heading , who verbally acknowledged these results. Electronically Signed   By: Marijo Conception, M.D.   On: 08/08/2016 14:13   Dg Humerus Right  Result Date: 08/08/2016 CLINICAL DATA:  Right mid to distal arm pain following a fall today. EXAM: RIGHT HUMERUS - 2+ VIEW COMPARISON:  Right shoulder series of March 15, 2013. FINDINGS: The humerus is subjectively adequately mineralized. The observed portions of the shoulder and elbow are intact. There is narrowing of the subacromial subdeltoid space which appears chronic when compared to the previous study. The soft tissues of the arm are unremarkable. IMPRESSION: There is no acute bony abnormality of the right humerus. Electronically Signed   By: David  [name] M.D.   On: 08/08/2016 14:00   Ct Maxillofacial Wo Cm  Result Date: 08/08/2016 CLINICAL DATA:  Head and neck pain and facial injuries after fall. EXAM: CT HEAD  WITHOUT CONTRAST CT MAXILLOFACIAL WITHOUT CONTRAST CT CERVICAL SPINE WITHOUT CONTRAST TECHNIQUE: Multidetector CT imaging of the head, cervical spine, and maxillofacial structures were performed using the standard protocol without intravenous contrast. Multiplanar CT image reconstructions of the cervical spine and maxillofacial structures were also generated. COMPARISON:  CT scan of May 22, 2007. FINDINGS: CT HEAD FINDINGS Brain: Mild chronic ischemic white matter disease is noted. Mild diffuse cortical atrophy is noted. No mass effect or midline shift is noted. Ventricular size is within normal limits. There is no evidence of mass lesion or acute infarction. Small high density focus is seen in right posterior parietal sulcus concerning for small focus of subarachnoid hemorrhage. Vascular: No hyperdense vessel or unexpected calcification. Skull: Normal. Negative for fracture or focal lesion. Other: Old lacunar infarctions are noted in both basal ganglia bilaterally. Small left frontal scalp hematoma is noted. CT MAXILLOFACIAL FINDINGS Osseous: Moderately displaced and comminuted nasal bone fracture is noted, which is displaced to the right. Orbits: Negative. No traumatic or inflammatory finding. Sinuses: Right ethmoid sinusitis is noted. Bilateral maxillary sinusitis is noted. Soft tissues: Negative. CT CERVICAL SPINE FINDINGS Alignment: Normal. Skull base and vertebrae: No acute fracture. No primary bone lesion or focal pathologic process. Soft tissues and spinal canal: No prevertebral fluid or swelling. No visible canal hematoma. Disc levels: Severe degenerative disc disease is noted at C3-4, C4-5, C5-6 and C6-7 with  anterior and posterior osteophyte formation. Upper chest: Negative. Other: None. IMPRESSION: Mild chronic ischemic white matter disease. Mild diffuse cortical atrophy. Small left frontal scalp hematoma. Probable small focus of subarachnoid hemorrhage seen in right posterior parietal sulcus.  Moderately displaced and comminuted nasal bone fracture is noted. Severe multilevel degenerative disc disease is noted in the cervical spine. No fracture or spondylolisthesis is noted. Critical Value/emergent results were called by telephone at the time of interpretation on 08/08/2016 at 2:12 pm to Dr. Dalia Heading , who verbally acknowledged these results. Electronically Signed   By: Marijo Conception, M.D.   On: 08/08/2016 14:13     Not all labs, radiology exams or other studies done during hospitalization come through on my EPIC note; however they are reviewed by me.    Assessment and Plan  SUBARACHNOID HEMORRHAGE- CT head showed Probable small focus of subarachnoid hemorrhage seen in right posterior parietal sulcus.  Neurosurgery was consulted and recommended observation and repeat CT head in 12 hours, Keppra for 7 days  Repeat CT head after 12 hours was done which showed previously noted small focus of apparent subarachnoid hemorrhage along the right posterior parietal sulcus is no longer seen Hold aspirin, warfarin for now. I discussed with patient's family (3 daughters in the room) regarding anticoagulation, they did not feel comfortable stopping the warfarin permanently given the stroke risk with atrial fibrillation. However they will discuss patient's cardiologist, Dr. Harrington Challenger regarding risks and benefits of anticoagulation given the history of falls, SAH and make decision.  SNF - pt admitted for OT/PT; keppra for 7 days  VASOVAGAL SYNCOPE -  history of vasovagal episodes Orthostatics were mildly positive, patient was placed on IV fluid hydration.  carotid Dopplers showed 1-39% stenosis of the right internal carotid artery and the left internal carotid artery PT evaluation recommended skilled nursing facility;  2-D echo showed EF of 60-65% with grade 2 diastolic dysfunction  B-35 folate cortisol within normal limits SNF - will be following BP daily   R ROTATOR CUFF TEAR - CTshoulder  consistent withComplete tear of the supraspinatus tendon with mild supraspinatus muscle  Per orthopedics, Dr. Veverly Fells, sling for support and outpatient follow-up Patient had cortisone injection done for pain relief on 08/12/16 SNF - admitted for OT/PT  CAD OF ARTERY BYPASS GRAFT SNF - cont metoprolol 25 mg BID and statin;ASA and coumadin being held  AF  SNF - rate controlled with digoxin and metoprolol, prophylaxis with coumadin being held  HLD - SNF - cont pravachol 80 mg daily and fenofibrate  DM2 SNF - A1c was 8.9; cont Januvia and and lantus 50 u q HS  MILDLY COMMINUTED NASAL BONE FRACTURE SNF - outpt f/u with ENT   Time spent > 45 min;> 50% of time with patient was spent reviewing records, labs, tests and studies, counseling and developing plan of care  Webb Silversmith D. Sheppard Coil, MD

## 2016-08-13 NOTE — Progress Notes (Signed)
Pt has orders to be discharged to [facility]. Discharge instructions given and pt has no additional questions at this time. Medication regimen reviewed and pt educated. Pt verbalized understanding and has no additional questions. Telemetry box removed. IV removed and site in good condition. Pt stable and waiting for transportation.   Maurene Capes RN

## 2016-08-14 NOTE — Telephone Encounter (Signed)
Will route to Dr. Harrington Challenger for review/recommendations. Pt hospitalized 08/08/16, for syncope.  Notes copied from d/c summary on 08/12/16, dc'd to SNF on 08/13/16 with instructions to hold asa and coumadin for now.   Hospital Course:  SAH (subarachnoid hemorrhage) (HCC) - CT head showed Probable small focus of subarachnoid hemorrhage seen in right posterior parietal sulcus. - Neurosurgery was consulted and recommended observation and repeat CT head in 12 hours, Keppra for 7 days - Repeat CT head was done which showed previously noted small focus of apparent subarachnoid hemorrhage along the right posterior parietal sulcus is no longer seen - Hold aspirin, warfarin for now. I discussed with patient's family (3 daughters in the room) regarding anticoagulation, they did not feel comfortable stopping the warfarin permanently given the stroke risk with atrial fibrillation. However they will discuss patient's cardiologist, Dr. Harrington Challenger regarding risks and benefits of anticoagulation given the history of falls, SAH and make decision.

## 2016-08-14 NOTE — Telephone Encounter (Signed)
With [facility] coumadin is too high risk  I would not resume

## 2016-08-15 NOTE — Telephone Encounter (Signed)
Spoke with patient's daughter [name].  She wanted to know if pt should be taking both aspirin and coumadin at this time.  She is unsure what he is receiving at [facility].  I called the nurse, Lenward Chancellor, at [facility] and confirmed patient is not taking warfarin or aspirin--these are on hold until after cardiology appointment on 08/19/16.  Advised patient's daughter, [name], that patient should update DPR to include others besides Marlane Mingle if desired.

## 2016-08-15 NOTE — Telephone Encounter (Signed)
Patient daughter Claiborne Billings) returning your call. Thanks.

## 2016-08-16 ENCOUNTER — Encounter: Payer: Self-pay | Admitting: Physician Assistant

## 2016-08-16 DIAGNOSIS — Z7901 Long term (current) use of anticoagulants: Secondary | ICD-10-CM | POA: Diagnosis not present

## 2016-08-16 DIAGNOSIS — Z5181 Encounter for therapeutic drug level monitoring: Secondary | ICD-10-CM | POA: Diagnosis not present

## 2016-08-16 DIAGNOSIS — S0993XD Unspecified injury of face, subsequent encounter: Secondary | ICD-10-CM | POA: Diagnosis not present

## 2016-08-18 DIAGNOSIS — E785 Hyperlipidemia, unspecified: Secondary | ICD-10-CM | POA: Insufficient documentation

## 2016-08-18 DIAGNOSIS — M75101 Unspecified rotator cuff tear or rupture of right shoulder, not specified as traumatic: Secondary | ICD-10-CM | POA: Insufficient documentation

## 2016-08-18 DIAGNOSIS — S022XXA Fracture of nasal bones, initial encounter for closed fracture: Secondary | ICD-10-CM | POA: Insufficient documentation

## 2016-08-19 ENCOUNTER — Ambulatory Visit (INDEPENDENT_AMBULATORY_CARE_PROVIDER_SITE_OTHER): Payer: Medicare HMO | Admitting: *Deleted

## 2016-08-19 ENCOUNTER — Encounter: Payer: Self-pay | Admitting: Physician Assistant

## 2016-08-19 ENCOUNTER — Ambulatory Visit (INDEPENDENT_AMBULATORY_CARE_PROVIDER_SITE_OTHER): Payer: Medicare HMO | Admitting: Physician Assistant

## 2016-08-19 ENCOUNTER — Encounter: Payer: Self-pay | Admitting: Internal Medicine

## 2016-08-19 VITALS — Ht 67.0 in | Wt 163.0 lb

## 2016-08-19 DIAGNOSIS — I609 Nontraumatic subarachnoid hemorrhage, unspecified: Secondary | ICD-10-CM

## 2016-08-19 DIAGNOSIS — I10 Essential (primary) hypertension: Secondary | ICD-10-CM

## 2016-08-19 DIAGNOSIS — R55 Syncope and collapse: Secondary | ICD-10-CM | POA: Diagnosis not present

## 2016-08-19 DIAGNOSIS — I4821 Permanent atrial fibrillation: Secondary | ICD-10-CM

## 2016-08-19 DIAGNOSIS — I482 Chronic atrial fibrillation: Secondary | ICD-10-CM | POA: Diagnosis not present

## 2016-08-19 DIAGNOSIS — Z950 Presence of cardiac pacemaker: Secondary | ICD-10-CM

## 2016-08-19 DIAGNOSIS — I2510 Atherosclerotic heart disease of native coronary artery without angina pectoris: Secondary | ICD-10-CM

## 2016-08-19 NOTE — Progress Notes (Signed)
Cardiology Office Note:    Date:  08/19/2016   ID:  [name], DOB 1926-05-09, MRN [id number]  PCP:  Nicholas November, MD  Cardiologist:  Dr. Dorris Frank   Electrophysiologist:  Dr. Cristopher Frank   Referring MD: Nicholas Branch, MD   Chief Complaint  Patient presents with  . Hospitalization Follow-up    admx with syncope     History of Present Illness:    [name] is a 83 y.o. male with a hx of CAD status post CABG, permanent atrial fibrillation, small AAA, HTN, HL, diabetes, hypothyroidism, anemia, symptomatic bradycardia status post pacemaker.    Admitted 3/29-4/3 after a fall complicated by subarachnoid hemorrhage, facial abrasions as well as nasal bone fracture and rotator cuff injury. There was a question of whether or not the patient had a syncopal episode. Patient was followed by neurosurgery follow-up CT prior to discharge demonstrated resolution of his subarachnoid hemorrhage. Recommendation was to hold aspirin and warfarin until seen in follow-up by cardiology. Records indicate that his orthostatic vital signs were mildly positive and he was given IV fluids. Echocardiogram demonstrated normal LV function with moderate diastolic dysfunction.   He returns for follow-up. He is here with his daughter who usually comes in with his ex-wife who also sees me.  [name] tells me he was cleaning his yard and walking back to his patio.  He then woke up on the ground.  He was confused for a while after he awoke.  He denies any chest pain or dyspnea prior to passing out.  He denies orthopnea or PND. He denies edema. He is somewhat sore from his fall. Otherwise, since DC he has felt well. He does note one episode of his heart pounding hard several days ago. Otherwise he denies palpitations.  Prior CV studies:   The following studies were reviewed today:  Echo 08/09/16 Mod conc LHV, EF 60-65, no RWMA, Gr 2 DD, mild AI, mild MR, mod LAE, mild RAE, mild TR, PASP 39  Carotid US 08/09/16 Bilat ICA  1-39  AAA [place] 2/16 Small infrarenal fusiform AAA 2.7 x 2.6 cm >> follow-up when necessary  LHC 10/04/11 LM 20 LAD 100 LCx AV groove proximal and mid stents patent RCA proximal 20, mid 99, distal 60 diffuse, 99; PLB occluded SVG-OM 2/OM3 occluded SVG-PLA/PDA patent SVG-DX patent with 40% mid LIMA-LAD patent EF 55 Impression: 1. Triple vessel CAD s/p 6V CABG with 4/6 patent bypass grafts. The Circumflex system is known to have occluded grafts but there are patent stents in this system.  2. No significant progression of CAD since last cath in 2007.  3. Preserved LV systolic function.    Past Medical History:  Diagnosis Date  . AAA (abdominal aortic aneurysm) ([place])   . Anemia due to chronic blood loss 11/25/2008   Recurrent over the years EGD and colonoscopy x 2 each 2004 and 2010 without cause    . Atrial fibrillation ([place])   . BPH (benign prostatic hyperplasia)    reports aprocedure (TURP) remotely in [place].Marland KitchenNo futher f/u w/ urology  . CAD (coronary artery disease)   . Diabetes mellitus, type 2 ([place])   . Diverticulosis    left Nicholas  . Erosive gastritis   . Fall 08/08/2016   OUTSIDE AT HOME FACIAL TRAUMA   . GERD (gastroesophageal reflux disease)   . Hyperlipidemia   . Hypertension   . Hypothyroidism   . Insomnia    transient  . Osteopenia    dexa  2-11  . Vitamin B12 deficiency     Past Surgical History:  Procedure Laterality Date  . CARDIAC CATHETERIZATION  01/31/06, 09/25/10, 09-2011  . CATARACT EXTRACTION     right  . CHOLECYSTECTOMY, LAPAROSCOPIC    . COLONOSCOPY     multiple  . CORONARY ARTERY BYPASS GRAFT     1999 stents in 2000  . ESOPHAGOGASTRODUODENOSCOPY     multiple  . GIVENS CAPSULE STUDY N/A 11/04/2012   Procedure: GIVENS CAPSULE STUDY;  Surgeon: Gatha Mayer, MD;  Location: [facility] ENDOSCOPY;  Service: Endoscopy;  Laterality: N/A;  . inguinal herniorrhaphies     bilateral  . LEFT HEART CATHETERIZATION WITH CORONARY ANGIOGRAM N/A 10/04/2011   Procedure:  LEFT HEART CATHETERIZATION WITH CORONARY ANGIOGRAM;  Surgeon: Burnell Blanks, MD;  Location: [facility] CATH LAB;  Service: Cardiovascular;  Laterality: N/A;  . pacemaker  [place], 2013   medtronic minix 8341  . PENILE PROSTHESIS IMPLANT  1992  . PERMANENT PACEMAKER GENERATOR CHANGE N/A 06/05/2011   Procedure: PERMANENT PACEMAKER GENERATOR CHANGE;  Surgeon: Evans Lance, MD;  Location: [facility] CATH LAB;  Service: Cardiovascular;  Laterality: N/A;  . PROSTATECTOMY     transurethral  . right hip replacement    . TRANSTHORACIC ECHOCARDIOGRAM  12/2006    Current Medications: Current Meds  Medication Sig  . amoxicillin (AMOXIL) 500 MG capsule Take 2,000 mg by mouth See admin instructions. ONE HOUR PRIOR TO [facility] DENTAL APPOINTMENT  . Blood Glucose Monitoring Suppl (TRUE METRIX METER) w/Device KIT Check blood sugar four times daily.  . digoxin (LANOXIN) 0.125 MG tablet Take 0.5 tablets (0.0625 mg total) by mouth daily.  [name] EASY TOUCH INSULIN SYRINGE 31G X 5/16" 1 ML MISC USE AS DIRECTED WITH NPH INSULIN  . fenofibrate micronized (LOFIBRA) 134 MG capsule Take 1 capsule (134 mg total) by mouth daily.  [name] glucose blood (TRUE METRIX BLOOD GLUCOSE TEST) test strip Check blood sugar four times daily.  . insulin glargine (LANTUS) 100 UNIT/ML injection Inject 50 Units into the skin at bedtime.  . Insulin Pen Needle 31G X 6 MM MISC To use w/ Lantus  . latanoprost (XALATAN) 0.005 % ophthalmic solution Place 1 drop into both eyes at bedtime.  [name] levothyroxine (SYNTHROID, LEVOTHROID) 88 MCG tablet Take 1 tablet (88 mcg total) by mouth daily before breakfast.  . metoprolol tartrate (LOPRESSOR) 25 MG tablet Take 25 mg by mouth 2 (two) times daily.  . nitroGLYCERIN (NITROSTAT) 0.4 MG SL tablet Place 0.4 mg under the tongue every 5 (five) minutes as needed for chest pain. Reported on 09/27/2015  . omeprazole (PRILOSEC) 40 MG capsule Take 1 capsule (40 mg total) by mouth daily.  . pravastatin (PRAVACHOL) 40 MG tablet  Take 2 tablets (80 mg total) by mouth daily.  . sitaGLIPtin (JANUVIA) 100 MG tablet Take 1 tablet (100 mg total) by mouth daily.  . traMADol (ULTRAM) 50 MG tablet Take 1 tablet (50 mg total) by mouth every 6 (six) hours as needed for moderate pain.  . vitamin B-12 (CYANOCOBALAMIN) 1000 MCG tablet Take 1 tablet (1,000 mcg total) by mouth daily.  [name] warfarin (COUMADIN) 3 MG tablet HOLD COUMADIN UNTIL FURTHER INSTRUCTIONS FROM YOUR PCP or Dr ROSS/ DR Lovena Le     Allergies:   Ace inhibitors; Codeine phosphate; and Hydrochlorothiazide w-triamterene   Social History   Social History  . Marital status: Legally Separated    Spouse name: N/A  . Number of children: 5  . Years of education: N/A  Occupational History  . retired Retired   Social History Main Topics  . Smoking status: Never Smoker  . Smokeless tobacco: Never Used  . Alcohol use 0.6 - 1.2 oz/week    1 - 2 Glasses of wine per week     Comment: socially   . Drug use: No  . Sexual activity: No   Other Topics Concern  . None   Social History Narrative   Lives by self, independent on ADL, retired. Separated from wife.    Daughters:   [name] ( lives in [place]) [phone]   Roanna Raider ( lives out of town)  [phone]           Family History  Problem Relation Age of Onset  . Alzheimer's disease Mother   . Heart failure Father   . CVA Father   . Coronary artery disease Brother     cabg  . Alzheimer's disease Sister   . Mental illness Sister   . Alzheimer's disease Sister   . Other Sister     lung problems  . Diabetes Other     several siblings  . Prostate cancer Neg Hx   . Nicholas cancer Neg Hx   . Esophageal cancer Neg Hx   . Stomach cancer Neg Hx   . Rectal cancer Neg Hx      ROS:   Please see the history of present illness.    ROS All other systems reviewed and are negative.   EKGs/Labs/Other Test Reviewed:    EKG:  EKG is  ordered today.  The ekg ordered today demonstrates Ventricular paced, HR  61  Recent Labs: 09/27/2015: ALT 13 06/18/2016: TSH 0.73 08/12/2016: BUN 13; Creatinine, Ser 1.09; Hemoglobin 11.5; Platelets 133; Potassium 4.0; Sodium 136   Recent Lipid Panel    Component Value Date/Time   CHOL 144 09/27/2015 1232   TRIG (H) 09/27/2015 1232    411.0 Triglyceride is over 400; calculations on Lipids are invalid.   TRIG 113 03/26/2006 1118   HDL 20.50 (L) 09/27/2015 1232   CHOLHDL 7 09/27/2015 1232   VLDL 51.2 (H) 10/28/2013 1431   LDLCALC 63 10/28/2013 1431   LDLDIRECT 74.0 09/27/2015 1232     Physical Exam:    VS:  Ht 5\' 7"  (1.702 m)   Wt 163 lb (73.9 kg)   SpO2 98%   BMI 25.53 kg/m     Orthostatic VS for the past 24 hrs (Last 3 readings):  BP- Lying Pulse- Lying BP- Sitting Pulse- Sitting BP- Standing at 0 minutes Pulse- Standing at 0 minutes BP- Standing at 3 minutes Pulse- Standing at 3 minutes  08/19/16 1627 135/72 60 121/69 63 125/66 61 127/69 60    Wt Readings from Last 3 Encounters:  08/19/16 163 lb (73.9 kg)  08/13/16 168 lb 6.4 oz (76.4 kg)  08/13/16 170 lb 6.7 oz (77.3 kg)     Physical Exam  Constitutional: He is oriented to person, place, and time. He appears well-developed and well-nourished. No distress.  HENT:  Head: Normocephalic. Head is with laceration (above left eye; periorbital ecchymosis).  Eyes: No scleral icterus.  Neck: Normal range of motion. No JVD present. Carotid bruit is not present.  Cardiovascular: Normal rate, regular rhythm, S1 normal and S2 normal.   No murmur heard. Pulmonary/Chest: Effort normal and breath sounds normal. He has no wheezes. He has no rhonchi. He has no rales.  Abdominal: Soft. There is no tenderness.  Musculoskeletal: He exhibits no edema.  Neurological: He  is alert and oriented to person, place, and time.  Skin: Skin is warm and dry.  Psychiatric: He has a normal mood and affect.    ASSESSMENT:    1. Syncope, unspecified syncope type   2. SAH (subarachnoid hemorrhage) ([place])   3. Coronary  artery disease involving native coronary artery of native heart without angina pectoris   4. Permanent atrial fibrillation ([place])   5. Essential hypertension   6. PACEMAKER, PERMANENT    PLAN:    In order of problems listed above:  1. Syncope, unspecified syncope type -  Unexplained. Echocardiogram in the hospital demonstrated normal LV function. He has had no angina. Cardiac enzymes were normal. Telemetry in the hospital did not demonstrate any significant arrhythmia. His device was interrogated today in the office. He had no episodes documented to explain his syncope. I reviewed his case with Dr. Harrington Challenger. He did have 1 set of orthostatic vital signs with a blood pressure drop of 120 lying to 90 standing. At this point, the only explanation for his syncope would be orthostatic hypotension. Dr. Harrington Challenger will review his case further with Dr. Lovena Le for other recommendations, if any. I have advised the patient to not drive for now.  2. SAH (subarachnoid hemorrhage) (HCC) - FU with neurosurgery this week as planned.  Will not resume Coumadin until Neurosurgery sees him in follow up/clears him for anticoagulation.   3. Coronary artery disease involving native coronary artery of native heart without angina pectoris -  s/p CABG.  LHC in 2013 with 4/6 grafts patent.  He is not having angina. EF by echo 3/18 was normal.  Continue beta-blocker, statin.   4. Permanent atrial fibrillation (HCC) - Coumadin on hold given recent SAH.  D/w Dr. Harrington Challenger.  He sees neurosurgery Friday.  Will await reevaluation by neurosurgery first.  If he is cleared to resume anticoagulation, will consider resuming at follow up in 3-4 weeks.   5. Essential hypertension - BP controlled. Orthostatic VS normal today.   6. PACEMAKER, PERMANENT -  Device interrogated today. No episodes noted.  Device functioning normally.  FU with EP as planned.   Total time spent with patient today 40 minutes. This includes reviewing records, evaluating  the patient and coordinating care. Face-to-face time >50%.   Dispo:  Return in about 4 weeks (around 09/16/2016) for Close Follow Up, w/ Dr. Harrington Challenger, w/ Richardson Dopp, PA-C.   Medication Adjustments/Labs and Tests Ordered: Current medicines are reviewed at length with the patient today.  Concerns regarding medicines are outlined above.  Medication changes, Labs and Tests ordered today are outlined in the Patient Instructions noted below. Patient Instructions  Medication Instructions:  PER Ashwin Tibbs, PAC HOLD OFF ON COUMADIN FOR NOW  Labwork: NONE ORDERED  Testing/Procedures: NONE ORDERED  Follow-Up: 1. Loribeth Katich, [facility] 09/09/16 @ 2:45 SAME DAY DR. Harrington Challenger IS IN THE OFFICE 2. YOU HAVE BEEN ADVISED TO FOLLOW UP WITH NEUROLOGY TO HELP DETERMINE WHEN TO RESTART COUMADIN  Any Other Special Instructions Will Be Listed Below (If Applicable).  If you need a refill on your cardiac medications before your next appointment, please call your pharmacy.  Signed, Richardson Dopp, PA-C  08/19/2016 5:34 PM    [facility] [address], [place], [place]  [address] Phone: [phone]; Fax: [phone]

## 2016-08-19 NOTE — Patient Instructions (Addendum)
Medication Instructions:  PER SCOTT WEAVER, PAC HOLD OFF ON COUMADIN FOR NOW  Labwork: NONE ORDERED  Testing/Procedures: NONE ORDERED  Follow-Up: 1. SCOTT WEAVER, [facility] 09/09/16 @ 2:45 SAME DAY DR. Harrington Challenger IS IN THE OFFICE 2. YOU HAVE BEEN ADVISED TO FOLLOW UP WITH NEUROLOGY TO HELP DETERMINE WHEN TO RESTART COUMADIN  Any Other Special Instructions Will Be Listed Below (If Applicable).  If you need a refill on your cardiac medications before your next appointment, please call your pharmacy.

## 2016-08-20 LAB — CBC AND DIFFERENTIAL
HCT: 48 % (ref 41–53)
HEMOGLOBIN: 15.2 g/dL (ref 13.5–17.5)
Platelets: 250 10*3/uL (ref 150–399)
WBC: 9 10^3/mL

## 2016-08-20 LAB — CUP PACEART INCLINIC DEVICE CHECK
Battery Impedance: 2443 Ohm
Battery Remaining Longevity: 30 mo
Brady Statistic RV Percent Paced: 75 %
Implantable Lead Implant Date: 19950413
Lead Channel Impedance Value: 0 Ohm
Lead Channel Impedance Value: 573 Ohm
Lead Channel Pacing Threshold Amplitude: 0.75 V
Lead Channel Pacing Threshold Pulse Width: 0.4 ms
Lead Channel Sensing Intrinsic Amplitude: 11.2 mV
Lead Channel Setting Pacing Pulse Width: 0.4 ms
Lead Channel Setting Sensing Sensitivity: 4 mV
MDC IDC LEAD LOCATION: 753860
MDC IDC MSMT BATTERY VOLTAGE: 2.73 V
MDC IDC MSMT LEADCHNL RV PACING THRESHOLD AMPLITUDE: 0.75 V
MDC IDC MSMT LEADCHNL RV PACING THRESHOLD PULSEWIDTH: 0.4 ms
MDC IDC PG IMPLANT DT: 20130123
MDC IDC SESS DTM: 20180409205557
MDC IDC SET LEADCHNL RV PACING AMPLITUDE: 2.5 V

## 2016-08-20 NOTE — ED Provider Notes (Signed)
[facility] DEPT Provider Note   CSN: [id number] Arrival date & time: 08/08/16  1257     History   Chief Complaint Chief Complaint  Patient presents with  . Fall    HPI [name] is a 83 y.o. male.  HPI Patient presents to the emergency department with injuries following a fall.  The patient had a syncopal episode and had an injury to the head and face.  Patient has a laceration to the left eyebrow region.  Patient states that he is unsure what caused him to pass out.  Patient was outside blowing leaves on the patio when he passed out.  She has no other injuries.  Patient states that he does have some headache as well. The patient denies chest pain, shortness of breath, blurred vision, neck pain, fever, cough, weakness, numbness, dizziness, anorexia, edema, abdominal pain, nausea, vomiting, diarrhea, rash, back pain, dysuria, hematemesis, bloody stool, near syncope, or syncope. Past Medical History:  Diagnosis Date  . AAA (abdominal aortic aneurysm) ([place])   . Anemia due to chronic blood loss 11/25/2008   Recurrent over the years EGD and colonoscopy x 2 each 2004 and 2010 without cause    . Atrial fibrillation ([place])   . BPH (benign prostatic hyperplasia)    reports aprocedure (TURP) remotely in [place] KitchenNo futher f/u w/ urology  . CAD (coronary artery disease)   . Diabetes mellitus, type 2 ([place])   . Diverticulosis    left colon  . Erosive gastritis   . Fall 08/08/2016   OUTSIDE AT HOME FACIAL TRAUMA   . GERD (gastroesophageal reflux disease)   . Hyperlipidemia   . Hypertension   . Hypothyroidism   . Insomnia    transient  . Osteopenia    dexa 2-11  . Vitamin B12 deficiency     Patient Active Problem List   Diagnosis Date Noted  . Rotator cuff tear, right 08/18/2016  . HLD (hyperlipidemia) 08/18/2016  . Nasal bone fracture 08/18/2016  . Syncope and collapse 08/08/2016  . SAH (subarachnoid hemorrhage) ([place]) 08/08/2016  . PCP NOTES >>>>>>>>>>>>>>>>>>>> 09/28/2015  .  Vasomotor rhinitis 09/14/2014  . Cough 03/01/2014  . Right shoulder injury 03/15/2013  . Hiatal hernia 01/28/2013  . Erosive gastritis 01/28/2013  . Warfarin anticoagulation 01/28/2013  . Medicare annual wellness visit, subsequent 07/14/2012  . BPH (benign prostatic hyperplasia) 06/07/2009  . Osteopenia 06/07/2009  . Anemia due to chronic blood loss 11/25/2008  . Vitamin B 12 deficiency 06/01/2008  . HTN (hypertension) 06/01/2008  . PACEMAKER, PERMANENT 06/01/2008  . GERD 03/31/2007  . Hypothyroidism 01/26/2007  . DM (diabetes mellitus) with complications ([place]) 72/82/0601  . Permanent atrial fibrillation ([place]) 01/26/2007  . Dyslipidemia 10/30/2006  . Coronary artery disease involving native coronary artery of native heart without angina pectoris 10/02/2006  . HIP REPLACEMENT, RIGHT, HX OF 02/04/2003    Past Surgical History:  Procedure Laterality Date  . CARDIAC CATHETERIZATION  01/31/06, 09/25/10, 09-2011  . CATARACT EXTRACTION     right  . CHOLECYSTECTOMY, LAPAROSCOPIC    . COLONOSCOPY     multiple  . CORONARY ARTERY BYPASS GRAFT     1999 stents in 2000  . ESOPHAGOGASTRODUODENOSCOPY     multiple  . GIVENS CAPSULE STUDY N/A 11/04/2012   Procedure: GIVENS CAPSULE STUDY;  Surgeon: Gatha Mayer, MD;  Location: [facility] ENDOSCOPY;  Service: Endoscopy;  Laterality: N/A;  . inguinal herniorrhaphies     bilateral  . LEFT HEART CATHETERIZATION WITH CORONARY ANGIOGRAM N/A 10/04/2011   Procedure:  LEFT HEART CATHETERIZATION WITH CORONARY ANGIOGRAM;  Surgeon: Burnell Blanks, MD;  Location: [facility] CATH LAB;  Service: Cardiovascular;  Laterality: N/A;  . pacemaker  [place], 2013   medtronic minix 8341  . PENILE PROSTHESIS IMPLANT  1992  . PERMANENT PACEMAKER GENERATOR CHANGE N/A 06/05/2011   Procedure: PERMANENT PACEMAKER GENERATOR CHANGE;  Surgeon: Evans Lance, MD;  Location: [facility] CATH LAB;  Service: Cardiovascular;  Laterality: N/A;  . PROSTATECTOMY     transurethral  . right hip  replacement    . TRANSTHORACIC ECHOCARDIOGRAM  12/2006       Home Medications    Prior to Admission medications   Medication Sig Start Date End Date Taking? Authorizing Provider  amoxicillin (AMOXIL) 500 MG capsule Take 2,000 mg by mouth See admin instructions. ONE HOUR PRIOR TO EACH DENTAL APPOINTMENT   Yes Historical Provider, MD  Blood Glucose Monitoring Suppl (TRUE METRIX METER) w/Device KIT Check blood sugar four times daily. 10/04/15  Yes Colon Branch, MD  digoxin (LANOXIN) 0.125 MG tablet Take 0.5 tablets (0.0625 mg total) by mouth daily. 06/17/16  Yes Evans Lance, MD  EASY TOUCH INSULIN SYRINGE 31G X 5/16" 1 ML MISC USE AS DIRECTED WITH NPH INSULIN 11/16/13  Yes Colon Branch, MD  fenofibrate micronized (LOFIBRA) 134 MG capsule Take 1 capsule (134 mg total) by mouth daily. 07/17/16  Yes Colon Branch, MD  glucose blood (TRUE METRIX BLOOD GLUCOSE TEST) test strip Check blood sugar four times daily. 10/04/15  Yes Colon Branch, MD  Insulin Pen Needle 31G X 6 MM MISC To use w/ Lantus 10/02/15  Yes Colon Branch, MD  latanoprost (XALATAN) 0.005 % ophthalmic solution Place 1 drop into both eyes at bedtime. 06/07/16  Yes Historical Provider, MD  levothyroxine (SYNTHROID, LEVOTHROID) 88 MCG tablet Take 1 tablet (88 mcg total) by mouth daily before breakfast. 07/19/16  Yes Colon Branch, MD  omeprazole (PRILOSEC) 40 MG capsule Take 1 capsule (40 mg total) by mouth daily. 11/01/15  Yes Colon Branch, MD  pravastatin (PRAVACHOL) 40 MG tablet Take 2 tablets (80 mg total) by mouth daily. 07/03/16  Yes Colon Branch, MD  sitaGLIPtin (JANUVIA) 100 MG tablet Take 1 tablet (100 mg total) by mouth daily. 07/03/16  Yes Colon Branch, MD  vitamin B-12 (CYANOCOBALAMIN) 1000 MCG tablet Take 1 tablet (1,000 mcg total) by mouth daily. 10/02/15  Yes Colon Branch, MD  insulin glargine (LANTUS) 100 UNIT/ML injection Inject 50 Units into the skin at bedtime.    Historical Provider, MD  metoprolol tartrate (LOPRESSOR) 25 MG tablet Take 25 mg by mouth 2  (two) times daily.    Historical Provider, MD  nitroGLYCERIN (NITROSTAT) 0.4 MG SL tablet Place 0.4 mg under the tongue every 5 (five) minutes as needed for chest pain. Reported on 09/27/2015    Historical Provider, MD  traMADol (ULTRAM) 50 MG tablet Take 1 tablet (50 mg total) by mouth every 6 (six) hours as needed for moderate pain. 08/12/16   Ripudeep Krystal Eaton, MD  warfarin (COUMADIN) 3 MG tablet HOLD COUMADIN UNTIL FURTHER INSTRUCTIONS FROM YOUR PCP or Dr ROSS/ DR Lovena Le 08/12/16   Ripudeep Krystal Eaton, MD    Family History Family History  Problem Relation Age of Onset  . Alzheimer's disease Mother   . Heart failure Father   . CVA Father   . Coronary artery disease Brother     cabg  . Alzheimer's disease Sister   . Mental  illness Sister   . Alzheimer's disease Sister   . Other Sister     lung problems  . Diabetes Other     several siblings  . Prostate cancer Neg Hx   . Colon cancer Neg Hx   . Esophageal cancer Neg Hx   . Stomach cancer Neg Hx   . Rectal cancer Neg Hx     Social History Social History  Substance Use Topics  . Smoking status: Never Smoker  . Smokeless tobacco: Never Used  . Alcohol use 0.6 - 1.2 oz/week    1 - 2 Glasses of wine per week     Comment: socially      Allergies   Ace inhibitors; Codeine phosphate; and Hydrochlorothiazide w-triamterene   Review of Systems Review of Systems All other systems negative except as documented in the HPI. All pertinent positives and negatives as reviewed in the HPI.   Physical Exam Updated Vital Signs BP 129/64 (BP Location: Left Arm)   Pulse 69   Temp 98.4 F (36.9 C) (Oral)   Resp 16   Ht _0  (1.702 m)   Wt 76.4 kg Comment: b scale  SpO2 97%   BMI 26.38 kg/m   Physical Exam  Constitutional: He is oriented to person, place, and time. He appears well-developed and well-nourished. No distress.  HENT:  Head: Normocephalic.    Mouth/Throat: Oropharynx is clear and moist.  Eyes: Pupils are equal, round, and  reactive to light.  Neck: Normal range of motion. Neck supple.  Cardiovascular: Normal rate, regular rhythm and normal heart sounds.  Exam reveals no gallop and no friction rub.   No murmur heard. Pulmonary/Chest: Effort normal and breath sounds normal. No respiratory distress. He has no wheezes.  Abdominal: Soft. Bowel sounds are normal. He exhibits no distension. There is no tenderness.  Neurological: He is alert and oriented to person, place, and time. He exhibits normal muscle tone. Coordination normal.  Skin: Skin is warm and dry. Capillary refill takes less than 2 seconds. No rash noted. No erythema.  Psychiatric: He has a normal mood and affect. His behavior is normal.  Nursing note and vitals reviewed.    ED Treatments / Results  Labs (all labs ordered are listed, but only abnormal results are displayed) Labs Reviewed  BASIC METABOLIC PANEL - Abnormal; Notable for the following:       Result Value   Glucose, Bld 256 (*)    Calcium 8.5 (*)    GFR calc non Af Amer 52 (*)    GFR calc Af Amer 60 (*)    All other components within normal limits  CBC WITH DIFFERENTIAL/PLATELET - Abnormal; Notable for the following:    WBC 10.8 (*)    Neutro Abs 9.3 (*)    All other components within normal limits  URINALYSIS, ROUTINE W REFLEX MICROSCOPIC - Abnormal; Notable for the following:    Glucose, UA >=500 (*)    Ketones, ur 5 (*)    Squamous Epithelial / LPF 0-5 (*)    All other components within normal limits  PROTIME-INR - Abnormal; Notable for the following:    Prothrombin Time 26.9 (*)    All other components within normal limits  BASIC METABOLIC PANEL - Abnormal; Notable for the following:    Calcium 8.6 (*)    GFR calc non Af Amer 59 (*)    All other components within normal limits  CBC - Abnormal; Notable for the following:    WBC 11.0 (*)  RBC 4.21 (*)    Platelets 146 (*)    All other components within normal limits  GLUCOSE, CAPILLARY - Abnormal; Notable for the  following:    Glucose-Capillary 268 (*)    All other components within normal limits  GLUCOSE, CAPILLARY - Abnormal; Notable for the following:    Glucose-Capillary 276 (*)    All other components within normal limits  GLUCOSE, CAPILLARY - Abnormal; Notable for the following:    Glucose-Capillary 114 (*)    All other components within normal limits  HEMOGLOBIN A1C - Abnormal; Notable for the following:    Hgb A1c MFr Bld 8.9 (*)    All other components within normal limits  GLUCOSE, CAPILLARY - Abnormal; Notable for the following:    Glucose-Capillary 241 (*)    All other components within normal limits  GLUCOSE, CAPILLARY - Abnormal; Notable for the following:    Glucose-Capillary 267 (*)    All other components within normal limits  BASIC METABOLIC PANEL - Abnormal; Notable for the following:    Glucose, Bld 176 (*)    BUN 21 (*)    Calcium 8.3 (*)    GFR calc non Af Amer 53 (*)    All other components within normal limits  CBC - Abnormal; Notable for the following:    RBC 3.66 (*)    Hemoglobin 11.8 (*)    HCT 35.1 (*)    Platelets 117 (*)    All other components within normal limits  GLUCOSE, CAPILLARY - Abnormal; Notable for the following:    Glucose-Capillary 260 (*)    All other components within normal limits  GLUCOSE, CAPILLARY - Abnormal; Notable for the following:    Glucose-Capillary 139 (*)    All other components within normal limits  GLUCOSE, CAPILLARY - Abnormal; Notable for the following:    Glucose-Capillary 183 (*)    All other components within normal limits  GLUCOSE, CAPILLARY - Abnormal; Notable for the following:    Glucose-Capillary 217 (*)    All other components within normal limits  BASIC METABOLIC PANEL - Abnormal; Notable for the following:    Glucose, Bld 190 (*)    Calcium 8.1 (*)    GFR calc non Af Amer 57 (*)    All other components within normal limits  CBC - Abnormal; Notable for the following:    RBC 3.67 (*)    Hemoglobin 11.6 (*)     HCT 35.2 (*)    Platelets 124 (*)    All other components within normal limits  GLUCOSE, CAPILLARY - Abnormal; Notable for the following:    Glucose-Capillary 202 (*)    All other components within normal limits  GLUCOSE, CAPILLARY - Abnormal; Notable for the following:    Glucose-Capillary 174 (*)    All other components within normal limits  GLUCOSE, CAPILLARY - Abnormal; Notable for the following:    Glucose-Capillary 241 (*)    All other components within normal limits  GLUCOSE, CAPILLARY - Abnormal; Notable for the following:    Glucose-Capillary 203 (*)    All other components within normal limits  BASIC METABOLIC PANEL - Abnormal; Notable for the following:    Glucose, Bld 168 (*)    Calcium 8.1 (*)    GFR calc non Af Amer 59 (*)    All other components within normal limits  CBC - Abnormal; Notable for the following:    RBC 3.69 (*)    Hemoglobin 11.5 (*)    HCT 35.3 (*)  Platelets 133 (*)    All other components within normal limits  GLUCOSE, CAPILLARY - Abnormal; Notable for the following:    Glucose-Capillary 238 (*)    All other components within normal limits  GLUCOSE, CAPILLARY - Abnormal; Notable for the following:    Glucose-Capillary 242 (*)    All other components within normal limits  GLUCOSE, CAPILLARY - Abnormal; Notable for the following:    Glucose-Capillary 188 (*)    All other components within normal limits  GLUCOSE, CAPILLARY - Abnormal; Notable for the following:    Glucose-Capillary 199 (*)    All other components within normal limits  GLUCOSE, CAPILLARY - Abnormal; Notable for the following:    Glucose-Capillary 208 (*)    All other components within normal limits  GLUCOSE, CAPILLARY - Abnormal; Notable for the following:    Glucose-Capillary 181 (*)    All other components within normal limits  GLUCOSE, CAPILLARY - Abnormal; Notable for the following:    Glucose-Capillary 272 (*)    All other components within normal limits  GLUCOSE,  CAPILLARY - Abnormal; Notable for the following:    Glucose-Capillary 231 (*)    All other components within normal limits  GLUCOSE, CAPILLARY - Abnormal; Notable for the following:    Glucose-Capillary 329 (*)    All other components within normal limits  TROPONIN I  TROPONIN I  TROPONIN I  CORTISOL-AM, BLOOD  VITAMIN B12  FOLATE    EKG  EKG Interpretation  Date/Time:  Thursday August 08 2016 15:50:43 EDT Ventricular Rate:  60 PR Interval:    QRS Duration: 169 QT Interval:  452 QTC Calculation: 452 R Axis:   2 Text Interpretation:  Electronic ventricular pacemaker ST epression lead II new since prior Confirmed by [facility] MD, Junie Panning ([id number]) on 08/09/2016 9:17:07 PM       Radiology No results found.  Procedures Procedures (including critical care time)  Medications Ordered in ED Medications  lidocaine (XYLOCAINE) 2 % (with pres) injection 400 mg (400 mg Infiltration Given 08/08/16 1651)  acetaminophen (TYLENOL) tablet 1,000 mg (1,000 mg Oral Given 08/12/16 0936)     Initial Impression / Assessment and Plan / ED Course  I have reviewed the triage vital signs and the nursing notes.  Pertinent labs & imaging results that were available during my care of the patient were reviewed by me and considered in my medical decision making (see chart for details).     LACERATION REPAIR Performed by: Brent General Authorized by: Brent General Consent: Verbal consent obtained. Risks and benefits: risks, benefits and alternatives were discussed Consent given by: patient Patient identity confirmed: provided demographic data Prepped and Draped in normal sterile fashion Wound explored  Laceration Location: Left forehead just above the left eyebrow \   Laceration Length: 4.5 cm  No Foreign Bodies seen or palpated  Anesthesia: local infiltration  Local anesthetic: lidocaine 2 % without epinephrine  Anesthetic total: 5 ml  Irrigation method: syringe Amount  of cleaning: standard  Skin closure: 6-0 Prolene   Number of sutures: 9   Technique: Simple right   Patient tolerance: Patient tolerated the procedure well with no immediate complications.   Patient be admitted to the hospital for syncope.  Advised the plan and all questions were answered  Final Clinical Impressions(s) / ED Diagnoses   Final diagnoses:  Subarachnoid hematoma (HCC)  Right shoulder pain  Hypothyroidism, unspecified type  DM (diabetes mellitus) with complications (H[facility])  Dyslipidemia  Essential hypertension  Pain    New  Prescriptions Discharge Medication List as of 08/13/2016 12:19 PM    START taking these medications   Details  traMADol (ULTRAM) 50 MG tablet Take 1 tablet (50 mg total) by mouth every 6 (six) hours as needed for moderate pain., Starting Mon 08/12/2016, [employer], PA-C 08/21/16 [phone]

## 2016-08-20 NOTE — Progress Notes (Signed)
Add on pacemaker check in clinic for [name], [name] for hospital f/u. Normal device function. Threshold, sensing, impedance consistent with previous measurements. Device programmed to maximize longevity. No high ventricular rates noted. Device programmed at appropriate safety margins. Histogram distribution appropriate for patient activity level. Device programmed to optimize intrinsic conduction. Estimated longevity 1-3.5 years. Follow up with Device Clinic and [facility] as previously scheduled.

## 2016-08-23 DIAGNOSIS — S066X0D Traumatic subarachnoid hemorrhage without loss of consciousness, subsequent encounter: Secondary | ICD-10-CM | POA: Diagnosis not present

## 2016-08-26 DIAGNOSIS — M75121 Complete rotator cuff tear or rupture of right shoulder, not specified as traumatic: Secondary | ICD-10-CM | POA: Diagnosis not present

## 2016-08-26 DIAGNOSIS — M25511 Pain in right shoulder: Secondary | ICD-10-CM | POA: Diagnosis not present

## 2016-08-28 ENCOUNTER — Encounter: Payer: Self-pay | Admitting: Internal Medicine

## 2016-08-28 ENCOUNTER — Non-Acute Institutional Stay (SKILLED_NURSING_FACILITY): Payer: Medicare HMO | Admitting: Internal Medicine

## 2016-08-28 DIAGNOSIS — M75111 Incomplete rotator cuff tear or rupture of right shoulder, not specified as traumatic: Secondary | ICD-10-CM | POA: Diagnosis not present

## 2016-08-28 DIAGNOSIS — I609 Nontraumatic subarachnoid hemorrhage, unspecified: Secondary | ICD-10-CM

## 2016-08-28 DIAGNOSIS — E118 Type 2 diabetes mellitus with unspecified complications: Secondary | ICD-10-CM | POA: Diagnosis not present

## 2016-08-28 DIAGNOSIS — I2510 Atherosclerotic heart disease of native coronary artery without angina pectoris: Secondary | ICD-10-CM | POA: Diagnosis not present

## 2016-08-28 DIAGNOSIS — S022XXD Fracture of nasal bones, subsequent encounter for fracture with routine healing: Secondary | ICD-10-CM

## 2016-08-28 DIAGNOSIS — S4991XD Unspecified injury of right shoulder and upper arm, subsequent encounter: Secondary | ICD-10-CM | POA: Diagnosis not present

## 2016-08-28 DIAGNOSIS — R55 Syncope and collapse: Secondary | ICD-10-CM | POA: Diagnosis not present

## 2016-08-28 DIAGNOSIS — E785 Hyperlipidemia, unspecified: Secondary | ICD-10-CM

## 2016-08-28 DIAGNOSIS — Z951 Presence of aortocoronary bypass graft: Secondary | ICD-10-CM | POA: Diagnosis not present

## 2016-08-28 NOTE — Progress Notes (Signed)
Location:  [place] Room Number: 103P Place of Service:  SNF (31)  [name]. Sheppard Coil, MD  PCP: Kathlene November, MD Patient Care Team: Colon Branch, MD as PCP - General  Extended Emergency Contact Information Primary Emergency Contact: Rush,Leigh Address: [place], [place] Phone: [phone] Relation: Daughter Secondary Emergency Contact: Kalla,Tami  [place] Mobile Phone: [phone] Relation: None  Allergies  Allergen Reactions  . Ace Inhibitors Cough  . Codeine Phosphate Nausea Only  . Hydrochlorothiazide W-Triamterene Other (See Comments)    CRAMPING    Chief Complaint  Patient presents with  . Discharge Note    Discharged from SNF    HPI:  83 y.o. male  with atrial fibrillation, CAD status post CABG presented to the ED after a fall and had multiple facial abrasions. Patient was unaware of why he fell. Patient reported that he was helping his neighbor blow the leaves in the yard and then he may have passed out or tripped. Per his daughter he does have a history of vasovagal episodes. He did not have any bowel or bladder incontinence, no fitness seizure-like activity. CT of the head showed probable small focus of subarachnoid hemorrhage seen in the right posterior parietal sulcus and moderately displaced and comminuted nasal bone fracture. Patient was admitted to [facility] from 3/29-4/2 and Neurosurgery consult was obtained. They put pt on Keppra for 7 days and stopped ASA and warfarin.. Pt was found to have a R rotator cuff tear , tx with sling and PT. Pt was worked up for syncopal episode which was deemed vasovagal. Pt's hemorrhage did not enlarge and pt is admitted to SNF for OT/PT. Pt is now ready to be d/c to home with his daughter.    Past Medical History:  Diagnosis Date  . AAA (abdominal aortic aneurysm) ([place])   . Anemia due to chronic blood loss 11/25/2008   Recurrent over the  years EGD and colonoscopy x 2 each 2004 and 2010 without cause    . Atrial fibrillation ([place])   . BPH (benign prostatic hyperplasia)    reports aprocedure (TURP) remotely in [place] KitchenNo futher f/u w/ urology  . CAD (coronary artery disease)   . Diabetes mellitus, type 2 ([place])   . Diverticulosis    left colon  . Erosive gastritis   . Fall 08/08/2016   OUTSIDE AT HOME FACIAL TRAUMA   . GERD (gastroesophageal reflux disease)   . Hyperlipidemia   . Hypertension   . Hypothyroidism   . Insomnia    transient  . Osteopenia    dexa 2-11  . Vitamin B12 deficiency     Past Surgical History:  Procedure Laterality Date  . CARDIAC CATHETERIZATION  01/31/06, 09/25/10, 09-2011  . CATARACT EXTRACTION     right  . CHOLECYSTECTOMY, LAPAROSCOPIC    . COLONOSCOPY     multiple  . CORONARY ARTERY BYPASS GRAFT     1999 stents in 2000  . ESOPHAGOGASTRODUODENOSCOPY     multiple  . GIVENS CAPSULE STUDY N/A 11/04/2012   Procedure: GIVENS CAPSULE STUDY;  Surgeon: Gatha Mayer, MD;  Location: [facility] ENDOSCOPY;  Service: Endoscopy;  Laterality: N/A;  . inguinal herniorrhaphies     bilateral  . LEFT HEART CATHETERIZATION WITH CORONARY ANGIOGRAM N/A 10/04/2011   Procedure: LEFT HEART CATHETERIZATION WITH CORONARY ANGIOGRAM;  Surgeon: Burnell Blanks, MD;  Location: [facility] CATH LAB;  Service: Cardiovascular;  Laterality: N/A;  . pacemaker  [place], 2013   medtronic minix [id number]  . PENILE PROSTHESIS IMPLANT  1992  . PERMANENT PACEMAKER GENERATOR CHANGE N/A 06/05/2011   Procedure: PERMANENT PACEMAKER GENERATOR CHANGE;  Surgeon: Evans Lance, MD;  Location: [facility] CATH LAB;  Service: Cardiovascular;  Laterality: N/A;  . PROSTATECTOMY     transurethral  . right hip replacement    . TRANSTHORACIC ECHOCARDIOGRAM  12/2006     reports that he has never smoked. He has never used smokeless tobacco. He reports that he drinks about 0.6 - 1.2 oz of alcohol per week . He reports that he does not use drugs. Social History    Social History  . Marital status: Legally Separated    Spouse name: N/A  . Number of children: 5  . Years of education: N/A   Occupational History  . retired Retired   Social History Main Topics  . Smoking status: Never Smoker  . Smokeless tobacco: Never Used  . Alcohol use 0.6 - 1.2 oz/week    1 - 2 Glasses of wine per week     Comment: socially   . Drug use: No  . Sexual activity: No   Other Topics Concern  . Not on file   Social History Narrative   Lives by self, independent on ADL, retired. Separated from wife.    Daughters:   [name] ( lives in [place]) [phone]   [name] ( lives out of town)  [phone]          Pertinent  Health Maintenance Due  Topic Date Due  . OPHTHALMOLOGY EXAM  03/06/1936  . INFLUENZA VACCINE  12/11/2016  . FOOT EXAM  12/27/2016  . URINE MICROALBUMIN  12/27/2016  . HEMOGLOBIN A1C  02/09/2017  . PNA vac Low Risk Adult  Completed    Medications: Allergies as of 08/28/2016      Reactions   Ace Inhibitors Cough   Codeine Phosphate Nausea Only   Hydrochlorothiazide W-triamterene Other (See Comments)   CRAMPING      Medication List       Accurate as of 08/28/16  1:37 PM. Always use your most recent med list.          acetaminophen 325 MG tablet Commonly known as:  TYLENOL Take 650 mg by mouth every 6 (six) hours as needed.   amoxicillin 500 MG capsule Commonly known as:  AMOXIL Take 2,000 mg by mouth See admin instructions. ONE HOUR PRIOR TO EACH DENTAL APPOINTMENT   digoxin 0.125 MG tablet Commonly known as:  LANOXIN Take 0.5 tablets (0.0625 mg total) by mouth daily.   EASY TOUCH INSULIN SYRINGE 31G X 5/16" 1 ML Misc Generic drug:  Insulin Syringe-Needle U-100 USE AS DIRECTED WITH NPH INSULIN   fenofibrate micronized 134 MG capsule Commonly known as:  LOFIBRA Take 1 capsule (134 mg total) by mouth daily.   glucose blood test strip Commonly known as:  TRUE METRIX BLOOD GLUCOSE TEST Check blood sugar four times  daily.   insulin glargine 100 UNIT/ML injection Commonly known as:  LANTUS Inject 50 Units into the skin at bedtime.   Insulin Pen Needle 31G X 6 MM Misc To use w/ Lantus   latanoprost 0.005 % ophthalmic solution Commonly known as:  XALATAN Place 1 drop into both eyes at bedtime.   levothyroxine 88 MCG tablet Commonly known as:  SYNTHROID, LEVOTHROID Take 1 tablet (88 mcg total) by mouth daily before breakfast.  metoprolol tartrate 25 MG tablet Commonly known as:  LOPRESSOR Take 25 mg by mouth 2 (two) times daily.   nitroGLYCERIN 0.4 MG SL tablet Commonly known as:  NITROSTAT Place 0.4 mg under the tongue every 5 (five) minutes as needed for chest pain. Reported on 09/27/2015   omeprazole 40 MG capsule Commonly known as:  PRILOSEC Take 1 capsule (40 mg total) by mouth daily.   pravastatin 40 MG tablet Commonly known as:  PRAVACHOL Take 2 tablets (80 mg total) by mouth daily.   sitaGLIPtin 100 MG tablet Commonly known as:  JANUVIA Take 1 tablet (100 mg total) by mouth daily.   TRUE METRIX METER w/Device Kit Check blood sugar four times daily.   vitamin B-12 1000 MCG tablet Commonly known as:  CYANOCOBALAMIN Take 1 tablet (1,000 mcg total) by mouth daily.   warfarin 3 MG tablet Commonly known as:  COUMADIN HOLD COUMADIN UNTIL FURTHER INSTRUCTIONS FROM YOUR PCP or Dr ROSS/ DR Lovena Le        Vitals:   08/28/16 1222  BP: (!) 108/56  Pulse: 61  Resp: 17  Temp: 97.2 F (36.2 C)  Weight: 164 lb (74.4 kg)  Height: 5' 7" (1.702 m)   Body mass index is 25.69 kg/m.  Physical Exam  GENERAL APPEARANCE: Alert, conversant. No acute distress.  HEENT: Unremarkable. RESPIRATORY: Breathing is even, unlabored. Lung sounds are clear   CARDIOVASCULAR: Heart RRR no murmurs, rubs or gallops. No peripheral edema.  GASTROINTESTINAL: Abdomen is soft, non-tender, not distended w/ normal bowel sounds.  NEUROLOGIC: Cranial nerves 2-12 grossly intact. Moves all  extremities   Labs reviewed: Basic Metabolic Panel:  Recent Labs  08/10/16 0255 08/11/16 08/11/16 0417 08/12/16 08/12/16 0536  NA 137 135* 135 136* 136  K 3.9 3.9 3.9  --  4.0  CL 106  --  106  --  106  CO2 23  --  22  --  23  GLUCOSE 176*  --  190*  --  168*  BUN 21* _0 CREATININE 1.19 1.1 1.11 1.1 1.09  CALCIUM 8.3*  --  8.1*  --  8.1*   Lab Results  Component Value Date   MICROALBUR <0.7 12/28/2015   Liver Function Tests:  Recent Labs  09/27/15 1232  AST 12  ALT 13  ALKPHOS 37*  BILITOT 0.6  PROT 6.1  ALBUMIN 3.8   No results for input(s): LIPASE, AMYLASE in the last 8760 hours. No results for input(s): AMMONIA in the last 8760 hours. CBC:  Recent Labs  09/27/15 1232 06/18/16 1458  08/08/16 1410  08/10/16 0255  08/11/16 0417 08/12/16 08/12/16 0536 08/20/16  WBC 7.0 6.3  < > 10.8*  < > 7.7  < > 7.4 6.0 6.0 9.0  NEUTROABS 5.1 4.4  --  9.3*  --   --   --   --   --   --   --   HGB 13.8 14.5  < > 13.8  < > 11.8*  < > 11.6*  --  11.5* 15.2  HCT 39.8 43.8  < > 41.2  < > 35.1*  < > 35.2*  --  35.3* 48  MCV 95.9 96.0  --  95.2  < > 95.9  --  95.9  --  95.7  --   PLT 178.0 185.0  < > 150  < > 117*  < > 124*  --  133* 250  < > = values in this interval not displayed. Lipid  Recent Labs  09/27/15 1232  CHOL 144  HDL 20.50*  TRIG 411.0 Triglyceride is over 400; calculations on Lipids are invalid.*   Cardiac Enzymes:  Recent Labs  08/08/16 1847 08/09/16 0000 08/09/16 0621  TROPONINI <0.03 <0.03 <0.03   BNP: No results for input(s): BNP in the last 8760 hours. CBG:  Recent Labs  08/12/16 2134 08/13/16 0756 08/13/16 1133  GLUCAP 272* 231* 329*    Procedures and Imaging Studies During Stay: Dg Wrist Complete Left  Result Date: 08/08/2016 CLINICAL DATA:  Status post fall today and complains of mid to distal right arm pain. No definite wrist pain. The patient does not were called the particulars of the fall. EXAM: LEFT WRIST - COMPLETE  3+ VIEW COMPARISON:  None in PACs FINDINGS: The bones of the left wrist are subjectively adequately mineralized. The distal radius and ulna are intact. There is mild narrowing of the radiocarpal joint. There is moderate severe narrowing of the first [facility] joint. No acute carpal bone fracture is observed. The metacarpals are intact where visualized. IMPRESSION: There are degenerative changes of the left wrist centered on the radiocarpal joint and first CMC joint. There is no acute fracture nor dislocation. Electronically Signed   By: David  [name] M.D.   On: 08/08/2016 13:57   Ct Head Wo Contrast  Result Date: 08/09/2016 CLINICAL DATA:  Question of subarachnoid hemorrhage at the right posterior parietal sulcus. Initial encounter. EXAM: CT HEAD WITHOUT CONTRAST TECHNIQUE: Contiguous axial images were obtained from the base of the skull through the vertex without intravenous contrast. COMPARISON:  CT of the head performed 08/08/2016 FINDINGS: Brain: The previously noted small focus of apparent subarachnoid hemorrhage along the right posterior parietal sulcus is no longer seen. Prominence of the ventricles and sulci reflects mild to moderate cortical volume loss. Mild cerebellar atrophy is noted. Scattered periventricular and subcortical white matter change likely reflects small vessel ischemic microangiopathy. Chronic lacunar infarcts are noted at the left basal ganglia. The brainstem and fourth ventricle are within normal limits. The cerebral hemispheres demonstrate grossly normal gray-white differentiation. No mass effect or midline shift is seen. Vascular: No hyperdense vessel or unexpected calcification. Skull: A mildly comminuted fracture of the nasal bone is again noted, with rightward displacement. Sinuses/Orbits: The visualized portions of the orbits are within normal limits. The paranasal sinuses and mastoid air cells are well-aerated. Other: Mild soft tissue swelling is noted overlying the left frontal  calvarium. IMPRESSION: 1. Previously noted small focus of apparent subarachnoid hemorrhage along the right posterior parietal sulcus is no longer seen. 2. Mild soft tissue swelling overlying the left frontal calvarium. 3. Mildly comminuted fracture of the nasal bone, with rightward displacement. 4. Mild to moderate cortical volume loss and scattered small vessel ischemic microangiopathy. Chronic lacunar infarcts at the left basal ganglia. Electronically Signed   By: Garald Balding M.D.   On: 08/09/2016 03:56   Ct Head Wo Contrast  Result Date: 08/08/2016 CLINICAL DATA:  Head and neck pain and facial injuries after fall. EXAM: CT HEAD WITHOUT CONTRAST CT MAXILLOFACIAL WITHOUT CONTRAST CT CERVICAL SPINE WITHOUT CONTRAST TECHNIQUE: Multidetector CT imaging of the head, cervical spine, and maxillofacial structures were performed using the standard protocol without intravenous contrast. Multiplanar CT image reconstructions of the cervical spine and maxillofacial structures were also generated. COMPARISON:  CT scan of May 22, 2007. FINDINGS: CT HEAD FINDINGS Brain: Mild chronic ischemic white matter disease is noted. Mild diffuse cortical atrophy is noted. No mass effect or midline shift is noted.  Ventricular size is within normal limits. There is no evidence of mass lesion or acute infarction. Small high density focus is seen in right posterior parietal sulcus concerning for small focus of subarachnoid hemorrhage. Vascular: No hyperdense vessel or unexpected calcification. Skull: Normal. Negative for fracture or focal lesion. Other: Old lacunar infarctions are noted in both basal ganglia bilaterally. Small left frontal scalp hematoma is noted. CT MAXILLOFACIAL FINDINGS Osseous: Moderately displaced and comminuted nasal bone fracture is noted, which is displaced to the right. Orbits: Negative. No traumatic or inflammatory finding. Sinuses: Right ethmoid sinusitis is noted. Bilateral maxillary sinusitis is noted.  Soft tissues: Negative. CT CERVICAL SPINE FINDINGS Alignment: Normal. Skull base and vertebrae: No acute fracture. No primary bone lesion or focal pathologic process. Soft tissues and spinal canal: No prevertebral fluid or swelling. No visible canal hematoma. Disc levels: Severe degenerative disc disease is noted at C3-4, C4-5, C5-6 and C6-7 with anterior and posterior osteophyte formation. Upper chest: Negative. Other: None. IMPRESSION: Mild chronic ischemic white matter disease. Mild diffuse cortical atrophy. Small left frontal scalp hematoma. Probable small focus of subarachnoid hemorrhage seen in right posterior parietal sulcus. Moderately displaced and comminuted nasal bone fracture is noted. Severe multilevel degenerative disc disease is noted in the cervical spine. No fracture or spondylolisthesis is noted. Critical Value/emergent results were called by telephone at the time of interpretation on 08/08/2016 at 2:12 pm to Dr. Dalia Heading , who verbally acknowledged these results. Electronically Signed   By: Marijo Conception, M.D.   On: 08/08/2016 14:13   Ct Cervical Spine Wo Contrast  Result Date: 08/08/2016 CLINICAL DATA:  Head and neck pain and facial injuries after fall. EXAM: CT HEAD WITHOUT CONTRAST CT MAXILLOFACIAL WITHOUT CONTRAST CT CERVICAL SPINE WITHOUT CONTRAST TECHNIQUE: Multidetector CT imaging of the head, cervical spine, and maxillofacial structures were performed using the standard protocol without intravenous contrast. Multiplanar CT image reconstructions of the cervical spine and maxillofacial structures were also generated. COMPARISON:  CT scan of May 22, 2007. FINDINGS: CT HEAD FINDINGS Brain: Mild chronic ischemic white matter disease is noted. Mild diffuse cortical atrophy is noted. No mass effect or midline shift is noted. Ventricular size is within normal limits. There is no evidence of mass lesion or acute infarction. Small high density focus is seen in right posterior  parietal sulcus concerning for small focus of subarachnoid hemorrhage. Vascular: No hyperdense vessel or unexpected calcification. Skull: Normal. Negative for fracture or focal lesion. Other: Old lacunar infarctions are noted in both basal ganglia bilaterally. Small left frontal scalp hematoma is noted. CT MAXILLOFACIAL FINDINGS Osseous: Moderately displaced and comminuted nasal bone fracture is noted, which is displaced to the right. Orbits: Negative. No traumatic or inflammatory finding. Sinuses: Right ethmoid sinusitis is noted. Bilateral maxillary sinusitis is noted. Soft tissues: Negative. CT CERVICAL SPINE FINDINGS Alignment: Normal. Skull base and vertebrae: No acute fracture. No primary bone lesion or focal pathologic process. Soft tissues and spinal canal: No prevertebral fluid or swelling. No visible canal hematoma. Disc levels: Severe degenerative disc disease is noted at C3-4, C4-5, C5-6 and C6-7 with anterior and posterior osteophyte formation. Upper chest: Negative. Other: None. IMPRESSION: Mild chronic ischemic white matter disease. Mild diffuse cortical atrophy. Small left frontal scalp hematoma. Probable small focus of subarachnoid hemorrhage seen in right posterior parietal sulcus. Moderately displaced and comminuted nasal bone fracture is noted. Severe multilevel degenerative disc disease is noted in the cervical spine. No fracture or spondylolisthesis is noted. Critical Value/emergent results were called by telephone  at the time of interpretation on 08/08/2016 at 2:12 pm to Dr. Dalia Heading , who verbally acknowledged these results. Electronically Signed   By: Marijo Conception, M.D.   On: 08/08/2016 14:13   Ct Shoulder Right Wo Contrast  Result Date: 08/10/2016 CLINICAL DATA:  Status post fall.  Limited range of motion. EXAM: CT OF THE UPPER RIGHT EXTREMITY WITHOUT CONTRAST TECHNIQUE: Multidetector CT imaging of the upper right extremity was performed according to the standard protocol.  COMPARISON:  None. FINDINGS: Bones/Joint/Cartilage No fracture or dislocation. Normal alignment. No joint effusion. Loss of the normal acromion humeral distance as can be seen with a chronic rotator cuff tear. Mild arthropathy of the acromioclavicular joint.  Type I acromion. Partially visualize is cervical spine spondylosis. Ligaments Ligaments are suboptimally evaluated by CT. Muscles and Tendons Mild supraspinatus muscle atrophy. Complete tear of the supraspinatus tendon. Soft tissue No fluid collection or hematoma. No soft tissue mass. Pacemaker power pack in the right anterior chest wall. Visualize right lung is clear. IMPRESSION: 1.  No acute osseous injury of the right shoulder. 2. Complete tear of the supraspinatus tendon with mild supraspinatus muscle atrophy. Electronically Signed   By: Kathreen Devoid   On: 08/10/2016 10:53   Dg Hand 2 View Left  Result Date: 08/12/2016 CLINICAL DATA:  [place] last week EXAM: LEFT HAND - 2 VIEW COMPARISON:  Left wrist films of 08/08/2016 FINDINGS: The left radiocarpal joint space appears normal. The carpal bones are in normal position. Considerable degenerative joint disease again is noted involving the left first carpal -metacarpal articulations. There are degenerative changes throughout the DIP joints. IMPRESSION: No fracture.  Changes of osteoarthritis as noted above. Electronically Signed   By: Ivar Drape M.D.   On: 08/12/2016 16:23   Dg Humerus Right  Result Date: 08/08/2016 CLINICAL DATA:  Right mid to distal arm pain following a fall today. EXAM: RIGHT HUMERUS - 2+ VIEW COMPARISON:  Right shoulder series of March 15, 2013. FINDINGS: The humerus is subjectively adequately mineralized. The observed portions of the shoulder and elbow are intact. There is narrowing of the subacromial subdeltoid space which appears chronic when compared to the previous study. The soft tissues of the arm are unremarkable. IMPRESSION: There is no acute bony abnormality of the right  humerus. Electronically Signed   By: David  [name] M.D.   On: 08/08/2016 14:00   Ct Maxillofacial Wo Cm  Result Date: 08/08/2016 CLINICAL DATA:  Head and neck pain and facial injuries after fall. EXAM: CT HEAD WITHOUT CONTRAST CT MAXILLOFACIAL WITHOUT CONTRAST CT CERVICAL SPINE WITHOUT CONTRAST TECHNIQUE: Multidetector CT imaging of the head, cervical spine, and maxillofacial structures were performed using the standard protocol without intravenous contrast. Multiplanar CT image reconstructions of the cervical spine and maxillofacial structures were also generated. COMPARISON:  CT scan of May 22, 2007. FINDINGS: CT HEAD FINDINGS Brain: Mild chronic ischemic white matter disease is noted. Mild diffuse cortical atrophy is noted. No mass effect or midline shift is noted. Ventricular size is within normal limits. There is no evidence of mass lesion or acute infarction. Small high density focus is seen in right posterior parietal sulcus concerning for small focus of subarachnoid hemorrhage. Vascular: No hyperdense vessel or unexpected calcification. Skull: Normal. Negative for fracture or focal lesion. Other: Old lacunar infarctions are noted in both basal ganglia bilaterally. Small left frontal scalp hematoma is noted. CT MAXILLOFACIAL FINDINGS Osseous: Moderately displaced and comminuted nasal bone fracture is noted, which is displaced to the right. Orbits:  Negative. No traumatic or inflammatory finding. Sinuses: Right ethmoid sinusitis is noted. Bilateral maxillary sinusitis is noted. Soft tissues: Negative. CT CERVICAL SPINE FINDINGS Alignment: Normal. Skull base and vertebrae: No acute fracture. No primary bone lesion or focal pathologic process. Soft tissues and spinal canal: No prevertebral fluid or swelling. No visible canal hematoma. Disc levels: Severe degenerative disc disease is noted at C3-4, C4-5, C5-6 and C6-7 with anterior and posterior osteophyte formation. Upper chest: Negative. Other: None.  IMPRESSION: Mild chronic ischemic white matter disease. Mild diffuse cortical atrophy. Small left frontal scalp hematoma. Probable small focus of subarachnoid hemorrhage seen in right posterior parietal sulcus. Moderately displaced and comminuted nasal bone fracture is noted. Severe multilevel degenerative disc disease is noted in the cervical spine. No fracture or spondylolisthesis is noted. Critical Value/emergent results were called by telephone at the time of interpretation on 08/08/2016 at 2:12 pm to Dr. Dalia Heading , who verbally acknowledged these results. Electronically Signed   By: Marijo Conception, M.D.   On: 08/08/2016 14:13    Assessment/Plan:   SAH (subarachnoid hemorrhage) (HCC)  Syncope and collapse  Incomplete tear of right rotator cuff  Injury of right shoulder, subsequent encounter  Hyperlipidemia, unspecified hyperlipidemia type  Coronary artery disease involving native coronary artery of native heart without angina pectoris  CORONARY ARTERY BYPASS GRAFT, HX OF  DM (diabetes mellitus) with complications (HCC)  Closed fracture of nasal bone with routine healing, subsequent encounter   Patient is being discharged with the following home health services:  OT/PT/Nursing  Patient is being discharged with the following durable medical equipment:  Rolling walker  Patient has been advised to f/u with their PCP in 1-2 weeks to bring them up to date on their rehab stay.  Social services at facility was responsible for arranging this appointment.  Pt was provided with a 30 day supply of prescriptions for medications and refills must be obtained from their PCP.  For controlled substances, a more limited supply may be provided adequate until PCP appointment only.  Medications have been reconciled.   Time spent . 30 min;> 50% of time with patient was spent reviewing records, labs, tests and studies, counseling and developing plan of care  [name]. Sheppard Coil, MD

## 2016-08-30 ENCOUNTER — Ambulatory Visit: Payer: Medicare HMO | Admitting: Internal Medicine

## 2016-08-30 ENCOUNTER — Telehealth: Payer: Self-pay | Admitting: Internal Medicine

## 2016-08-30 NOTE — Telephone Encounter (Signed)
Patient cancelled 2pm appointment, stating he's been in the hospital and nursing home, stating he's feeling better and will see [name] for physical appointment, charge or no charge

## 2016-08-30 NOTE — Telephone Encounter (Signed)
No charge. 

## 2016-08-31 DIAGNOSIS — I10 Essential (primary) hypertension: Secondary | ICD-10-CM | POA: Diagnosis not present

## 2016-08-31 DIAGNOSIS — I2510 Atherosclerotic heart disease of native coronary artery without angina pectoris: Secondary | ICD-10-CM | POA: Diagnosis not present

## 2016-08-31 DIAGNOSIS — S022XXD Fracture of nasal bones, subsequent encounter for fracture with routine healing: Secondary | ICD-10-CM | POA: Diagnosis not present

## 2016-08-31 DIAGNOSIS — S46011D Strain of muscle(s) and tendon(s) of the rotator cuff of right shoulder, subsequent encounter: Secondary | ICD-10-CM | POA: Diagnosis not present

## 2016-08-31 DIAGNOSIS — I4891 Unspecified atrial fibrillation: Secondary | ICD-10-CM | POA: Diagnosis not present

## 2016-08-31 DIAGNOSIS — E119 Type 2 diabetes mellitus without complications: Secondary | ICD-10-CM | POA: Diagnosis not present

## 2016-09-02 ENCOUNTER — Other Ambulatory Visit: Payer: Self-pay | Admitting: Neurological Surgery

## 2016-09-02 ENCOUNTER — Telehealth: Payer: Self-pay | Admitting: Internal Medicine

## 2016-09-02 DIAGNOSIS — S066X0D Traumatic subarachnoid hemorrhage without loss of consciousness, subsequent encounter: Secondary | ICD-10-CM

## 2016-09-02 DIAGNOSIS — S46011D Strain of muscle(s) and tendon(s) of the rotator cuff of right shoulder, subsequent encounter: Secondary | ICD-10-CM | POA: Diagnosis not present

## 2016-09-02 DIAGNOSIS — I2510 Atherosclerotic heart disease of native coronary artery without angina pectoris: Secondary | ICD-10-CM | POA: Diagnosis not present

## 2016-09-02 DIAGNOSIS — E119 Type 2 diabetes mellitus without complications: Secondary | ICD-10-CM | POA: Diagnosis not present

## 2016-09-02 DIAGNOSIS — I10 Essential (primary) hypertension: Secondary | ICD-10-CM | POA: Diagnosis not present

## 2016-09-02 DIAGNOSIS — I4891 Unspecified atrial fibrillation: Secondary | ICD-10-CM | POA: Diagnosis not present

## 2016-09-02 DIAGNOSIS — S022XXD Fracture of nasal bones, subsequent encounter for fracture with routine healing: Secondary | ICD-10-CM | POA: Diagnosis not present

## 2016-09-02 NOTE — Telephone Encounter (Addendum)
Caller name:Pam- [facility] Relationship to patient: Can be [phone] Pharmacy:  Reason for call:Requesting new nursing, Disease and Medication Management orders. Would like verbal for 1x a week for 1 week, then 2x a week for 5 weeks.   ** patient was in the hospital due to fall on 08/08/16 and stayed till 08/13/16 to when he was sent to [facility] and discharged from there on 09/01/16, [facility] has been in the home since

## 2016-09-02 NOTE — Telephone Encounter (Signed)
Okay to give verbal orders?  ?

## 2016-09-02 NOTE — Telephone Encounter (Signed)
That is ok 

## 2016-09-03 ENCOUNTER — Telehealth: Payer: Self-pay | Admitting: Internal Medicine

## 2016-09-03 DIAGNOSIS — I10 Essential (primary) hypertension: Secondary | ICD-10-CM | POA: Diagnosis not present

## 2016-09-03 DIAGNOSIS — S022XXD Fracture of nasal bones, subsequent encounter for fracture with routine healing: Secondary | ICD-10-CM | POA: Diagnosis not present

## 2016-09-03 DIAGNOSIS — I2510 Atherosclerotic heart disease of native coronary artery without angina pectoris: Secondary | ICD-10-CM | POA: Diagnosis not present

## 2016-09-03 DIAGNOSIS — E119 Type 2 diabetes mellitus without complications: Secondary | ICD-10-CM | POA: Diagnosis not present

## 2016-09-03 DIAGNOSIS — S46011D Strain of muscle(s) and tendon(s) of the rotator cuff of right shoulder, subsequent encounter: Secondary | ICD-10-CM | POA: Diagnosis not present

## 2016-09-03 DIAGNOSIS — I609 Nontraumatic subarachnoid hemorrhage, unspecified: Secondary | ICD-10-CM

## 2016-09-03 DIAGNOSIS — I4891 Unspecified atrial fibrillation: Secondary | ICD-10-CM | POA: Diagnosis not present

## 2016-09-03 NOTE — Telephone Encounter (Signed)
FYI

## 2016-09-03 NOTE — Telephone Encounter (Signed)
[place].   Called in to make provider aware that pt is refusing there services because his insurance will not cover it.   Please assist pt with getting set up with a different home health agency.

## 2016-09-03 NOTE — Telephone Encounter (Signed)
Spoke w/ Pam, verbal orders given.

## 2016-09-03 NOTE — Telephone Encounter (Signed)
Home health referral placed

## 2016-09-03 NOTE — Telephone Encounter (Signed)
See if we can send him to a different home health  DX is subarachnoid hematoma or  SAH

## 2016-09-04 ENCOUNTER — Telehealth: Payer: Self-pay | Admitting: Internal Medicine

## 2016-09-04 ENCOUNTER — Telehealth: Payer: Self-pay | Admitting: Physician Assistant

## 2016-09-04 NOTE — Telephone Encounter (Signed)
[place] called back in to update provider. He says that pt misinformed him and that the insurance do and will cover there services. He would like to disregard previous message.

## 2016-09-04 NOTE — Telephone Encounter (Signed)
Noted  

## 2016-09-04 NOTE — Telephone Encounter (Signed)
New message    Pt daughter is calling about appt on 4/30. Pt daughter wants to know if they need to reschedule to after CT

## 2016-09-04 NOTE — Telephone Encounter (Signed)
Caller Name: Mrs Valeriano Relation to pt: spouse Call back number: [phone]  Pharmacy:  Reason for call:  Spouse would like to schedule INR check with the nurse, spouse states patient has been in and out of the hospital, please advise if nurse visit only or schedule with PCP, please advise.

## 2016-09-04 NOTE — Telephone Encounter (Signed)
Is the Head CT something the neurosurgeon ordered? Is follow up planned with the neurosurgeon after the CT? Ok to reschedule appointment until after CT and follow up done with neurosurgeon. Make sure notes are requested from neurosurgeon. Richardson Dopp, PA-C    09/04/2016 12:29 PM

## 2016-09-04 NOTE — Telephone Encounter (Signed)
Pt needs hosp f/u- if unable to get in this week for that, please schedule hosp f/u but nurse visit separate for INR.

## 2016-09-04 NOTE — Telephone Encounter (Signed)
Patient scheduled with PCP 09/09/2016 at 11:30am

## 2016-09-04 NOTE — Telephone Encounter (Signed)
I s/w pt's daughter [name] [facility]) ok per Brynda Rim. PA to move appt out so that we may have the results of his head ct. I asked [name] if possible to see if they could ask the Neurologist a copy of results of head CT so that we may have report in pt's chart here as well.  Pt rescheduled to see Brynda Rim. PA 09/17/16 @ 12:15. Leigh verbalized understanding to date and time change of appt for pt.

## 2016-09-04 NOTE — Telephone Encounter (Signed)
DPR for daughter [name]. [name] was wanting to know if we need to reschedule [name]. PA appt 4/30 since Head CT will not be done until 09/06/16 and they will not have the results in yet from the Neurologist. [name] also said her father is home now and went ahead and filled all of his pill boxes up and she believes he has been taking his coumadin wrong. I advised Leigh at this time she should call [name] about the coumadin since that who is checking pt's INR. I did give the suggestion to Leigh that if pt would like we can change him over to here in our office for INR checks. If they wan to do this we will set up New Pt appt. Leigh said she thought that was a good idea and she will ask her dad if he would like to check INR in our office going forward. Leigh agreeable to call [name] about coumadin right now for the pt. I will d/w PA about 4/30 appt to see if he would like to keep this appt or should we move appt out a little further. Leigh aware I will call her back later today. Leigh thanked me for my help today.

## 2016-09-05 ENCOUNTER — Telehealth: Payer: Self-pay | Admitting: Internal Medicine

## 2016-09-05 DIAGNOSIS — I4891 Unspecified atrial fibrillation: Secondary | ICD-10-CM | POA: Diagnosis not present

## 2016-09-05 DIAGNOSIS — S022XXD Fracture of nasal bones, subsequent encounter for fracture with routine healing: Secondary | ICD-10-CM | POA: Diagnosis not present

## 2016-09-05 DIAGNOSIS — I10 Essential (primary) hypertension: Secondary | ICD-10-CM | POA: Diagnosis not present

## 2016-09-05 DIAGNOSIS — S46011D Strain of muscle(s) and tendon(s) of the rotator cuff of right shoulder, subsequent encounter: Secondary | ICD-10-CM | POA: Diagnosis not present

## 2016-09-05 DIAGNOSIS — I2510 Atherosclerotic heart disease of native coronary artery without angina pectoris: Secondary | ICD-10-CM | POA: Diagnosis not present

## 2016-09-05 DIAGNOSIS — E119 Type 2 diabetes mellitus without complications: Secondary | ICD-10-CM | POA: Diagnosis not present

## 2016-09-05 NOTE — Telephone Encounter (Signed)
Caller name: Brittney  Relation to pt: OT from [facility] Call back number: [phone]  Pharmacy:  Reason for call:  Requesting verbal orders 2x 4 upper body range of motion, home safety and strengthen, please advise

## 2016-09-05 NOTE — Telephone Encounter (Signed)
Spoke w/ Brittney, verbal orders given.

## 2016-09-06 ENCOUNTER — Telehealth: Payer: Self-pay | Admitting: Internal Medicine

## 2016-09-06 ENCOUNTER — Ambulatory Visit
Admission: RE | Admit: 2016-09-06 | Discharge: 2016-09-06 | Disposition: A | Discharge status: Home / Self Care | Payer: Medicare HMO | Source: Ambulatory Visit | Attending: Neurological Surgery | Admitting: Neurological Surgery

## 2016-09-06 DIAGNOSIS — S022XXD Fracture of nasal bones, subsequent encounter for fracture with routine healing: Secondary | ICD-10-CM | POA: Diagnosis not present

## 2016-09-06 DIAGNOSIS — S066X0D Traumatic subarachnoid hemorrhage without loss of consciousness, subsequent encounter: Secondary | ICD-10-CM

## 2016-09-06 DIAGNOSIS — I609 Nontraumatic subarachnoid hemorrhage, unspecified: Secondary | ICD-10-CM | POA: Diagnosis not present

## 2016-09-06 DIAGNOSIS — I2510 Atherosclerotic heart disease of native coronary artery without angina pectoris: Secondary | ICD-10-CM | POA: Diagnosis not present

## 2016-09-06 DIAGNOSIS — I10 Essential (primary) hypertension: Secondary | ICD-10-CM | POA: Diagnosis not present

## 2016-09-06 DIAGNOSIS — I4891 Unspecified atrial fibrillation: Secondary | ICD-10-CM | POA: Diagnosis not present

## 2016-09-06 DIAGNOSIS — E119 Type 2 diabetes mellitus without complications: Secondary | ICD-10-CM | POA: Diagnosis not present

## 2016-09-06 DIAGNOSIS — S46011D Strain of muscle(s) and tendon(s) of the rotator cuff of right shoulder, subsequent encounter: Secondary | ICD-10-CM | POA: Diagnosis not present

## 2016-09-06 NOTE — Telephone Encounter (Signed)
Spoke w/ Allison, verbal orders given.  

## 2016-09-06 NOTE — Telephone Encounter (Signed)
Caller name: Ebony Hail Relationship to patient: [facility] Can be reached: [phone] Pharmacy:  Reason for call: Request verbal orders for [facility] PT 1 time a week for 1 week and 2 times a week for 4 weeks to work on strengthening, gait training and balancing

## 2016-09-09 ENCOUNTER — Encounter: Payer: Self-pay | Admitting: Internal Medicine

## 2016-09-09 ENCOUNTER — Ambulatory Visit: Payer: Medicare HMO | Admitting: Physician Assistant

## 2016-09-09 ENCOUNTER — Ambulatory Visit (INDEPENDENT_AMBULATORY_CARE_PROVIDER_SITE_OTHER): Payer: Medicare HMO | Admitting: Internal Medicine

## 2016-09-09 VITALS — BP 112/70 | HR 80 | Temp 97.8°F | Resp 14 | Ht 67.0 in | Wt 159.2 lb

## 2016-09-09 DIAGNOSIS — I482 Chronic atrial fibrillation: Secondary | ICD-10-CM

## 2016-09-09 DIAGNOSIS — I609 Nontraumatic subarachnoid hemorrhage, unspecified: Secondary | ICD-10-CM | POA: Diagnosis not present

## 2016-09-09 DIAGNOSIS — I4821 Permanent atrial fibrillation: Secondary | ICD-10-CM

## 2016-09-09 DIAGNOSIS — E118 Type 2 diabetes mellitus with unspecified complications: Secondary | ICD-10-CM | POA: Diagnosis not present

## 2016-09-09 DIAGNOSIS — I4891 Unspecified atrial fibrillation: Secondary | ICD-10-CM | POA: Diagnosis not present

## 2016-09-09 LAB — CBC WITH DIFFERENTIAL/PLATELET
BASOS PCT: 0.4 % (ref 0.0–3.0)
Basophils Absolute: 0 10*3/uL (ref 0.0–0.1)
Eosinophils Absolute: 0.1 10*3/uL (ref 0.0–0.7)
Eosinophils Relative: 0.9 % (ref 0.0–5.0)
HCT: 43 % (ref 39.0–52.0)
HEMOGLOBIN: 14.4 g/dL (ref 13.0–17.0)
Lymphocytes Relative: 10.7 % — ABNORMAL LOW (ref 12.0–46.0)
Lymphs Abs: 0.8 10*3/uL (ref 0.7–4.0)
MCHC: 33.5 g/dL (ref 30.0–36.0)
MCV: 98 fl (ref 78.0–100.0)
Monocytes Absolute: 0.6 10*3/uL (ref 0.1–1.0)
Monocytes Relative: 8.1 % (ref 3.0–12.0)
Neutro Abs: 6 10*3/uL (ref 1.4–7.7)
Neutrophils Relative %: 79.9 % — ABNORMAL HIGH (ref 43.0–77.0)
Platelets: 179 10*3/uL (ref 150.0–400.0)
RBC: 4.38 Mil/uL (ref 4.22–5.81)
RDW: 14.8 % (ref 11.5–15.5)
WBC: 7.5 10*3/uL (ref 4.0–10.5)

## 2016-09-09 LAB — BASIC METABOLIC PANEL
BUN: 19 mg/dL (ref 6–23)
CALCIUM: 9 mg/dL (ref 8.4–10.5)
CO2: 26 mEq/L (ref 19–32)
CREATININE: 1.24 mg/dL (ref 0.40–1.50)
Chloride: 104 mEq/L (ref 96–112)
GFR: 58.41 mL/min — AB (ref >60.00)
Glucose, Bld: 155 mg/dL — ABNORMAL HIGH (ref 70–99)
Potassium: 4.3 mEq/L (ref 3.5–5.1)
Sodium: 136 mEq/L (ref 135–145)

## 2016-09-09 NOTE — Progress Notes (Signed)
Subjective:    Patient ID: [name], male    DOB: 12/16/1925, 83 y.o.   MRN: [id number]  DOS:  09/09/2016 Type of visit - description :  Hospital follow-up, no TCM, here w/ [name], his daughter  Interval history: Admitted to hospital 08/08/2016, discharged 4 days later: Syncope, likely vaso-vagal, it lead to a intracranial bleed and a right rotator cuff tear. Here for follow-up. Was rec to hold Aspirin and warfarin   On Keppra 7 days Was d/c to a SNF, they also recommended a CBC BMP a week after.    Review of Systems  Since he left the hospital went to a nursing home, did physical therapy. He is now back home Overall, the patient and his daughter reports great improvement. They did notice a right foot drop few days ago before he left the SNF. Denies chest pain or difficulty breathing. No palpitations. No other falls. No headache or dizziness. CBGs in the 180s. Has noted some left ptosis.   Past Medical History:  Diagnosis Date  . AAA (abdominal aortic aneurysm) ([place])   . Anemia due to chronic blood loss 11/25/2008   Recurrent over the years EGD and colonoscopy x 2 each 2004 and 2010 without cause    . Atrial fibrillation ([place])   . BPH (benign prostatic hyperplasia)    reports aprocedure (TURP) remotely in [place].Marland KitchenNo futher f/u w/ urology  . CAD (coronary artery disease)   . Diabetes mellitus, type 2 ([place])   . Diverticulosis    left colon  . Erosive gastritis   . Fall 08/08/2016   OUTSIDE AT HOME FACIAL TRAUMA   . GERD (gastroesophageal reflux disease)   . Hyperlipidemia   . Hypertension   . Hypothyroidism   . Insomnia    transient  . Osteopenia    dexa 2-11  . Vitamin B12 deficiency     Past Surgical History:  Procedure Laterality Date  . CARDIAC CATHETERIZATION  01/31/06, 09/25/10, 09-2011  . CATARACT EXTRACTION     right  . CHOLECYSTECTOMY, LAPAROSCOPIC    . COLONOSCOPY     multiple  . CORONARY ARTERY BYPASS GRAFT     1999 stents in 2000  .  ESOPHAGOGASTRODUODENOSCOPY     multiple  . GIVENS CAPSULE STUDY N/A 11/04/2012   Procedure: GIVENS CAPSULE STUDY;  Surgeon: Gatha Mayer, MD;  Location: [facility] ENDOSCOPY;  Service: Endoscopy;  Laterality: N/A;  . inguinal herniorrhaphies     bilateral  . LEFT HEART CATHETERIZATION WITH CORONARY ANGIOGRAM N/A 10/04/2011   Procedure: LEFT HEART CATHETERIZATION WITH CORONARY ANGIOGRAM;  Surgeon: Burnell Blanks, MD;  Location[facility] CATH LAB;  Service: Cardiovascular;  Laterality: N/A;  . pacemaker  [place], 2013   medtronic minix 8341  . PENILE PROSTHESIS IMPLANT  1992  . PERMANENT PACEMAKER GENERATOR CHANGE N/A 06/05/2011   Procedure: PERMANENT PACEMAKER GENERATOR CHANGE;  Surgeon: Evans Lance, MD;  Location: [facility] CATH LAB;  Service: Cardiovascular;  Laterality: N/A;  . PROSTATECTOMY     transurethral  . right hip replacement    . TRANSTHORACIC ECHOCARDIOGRAM  12/2006    Social History   Social History  . Marital status: Legally Separated    Spouse name: N/A  . Number of children: 5  . Years of education: N/A   Occupational History  . retired Retired   Social History Main Topics  . Smoking status: Never Smoker  . Smokeless tobacco: Never Used  . Alcohol use 0.6 - 1.2 oz/week  1 - 2 Glasses of wine per week     Comment: socially   . Drug use: No  . Sexual activity: No   Other Topics Concern  . Not on file   Social History Narrative   Lives by self, independent on ADL, retired. Separated from wife.    Daughters:   [name] ( lives in [place]) [phone]   [name] ( lives out of town)  [phone]            Allergies as of 09/09/2016      Reactions   Ace Inhibitors Cough   Codeine Phosphate Nausea Only   Hydrochlorothiazide W-triamterene Other (See Comments)   CRAMPING      Medication List       Accurate as of 09/09/16 11:59 PM. Always use your most recent med list.          acetaminophen 325 MG tablet Commonly known as:  TYLENOL Take 650 mg by  mouth every 6 (six) hours as needed.   amoxicillin 500 MG capsule Commonly known as:  AMOXIL Take 2,000 mg by mouth See admin instructions. ONE HOUR PRIOR TO EACH DENTAL APPOINTMENT   digoxin 0.125 MG tablet Commonly known as:  LANOXIN Take 0.5 tablets (0.0625 mg total) by mouth daily.   EASY TOUCH INSULIN SYRINGE 31G X 5/16" 1 ML Misc Generic drug:  Insulin Syringe-Needle U-100 USE AS DIRECTED WITH NPH INSULIN   fenofibrate micronized 134 MG capsule Commonly known as:  LOFIBRA Take 1 capsule (134 mg total) by mouth daily.   glucose blood test strip Commonly known as:  TRUE METRIX BLOOD GLUCOSE TEST Check blood sugar four times daily.   insulin glargine 100 UNIT/ML injection Commonly known as:  LANTUS Inject 50 Units into the skin at bedtime.   Insulin Pen Needle 31G X 6 MM Misc To use w/ Lantus   latanoprost 0.005 % ophthalmic solution Commonly known as:  XALATAN Place 1 drop into both eyes at bedtime.   levothyroxine 88 MCG tablet Commonly known as:  SYNTHROID, LEVOTHROID Take 1 tablet (88 mcg total) by mouth daily before breakfast.   metoprolol tartrate 25 MG tablet Commonly known as:  LOPRESSOR Take 25 mg by mouth 2 (two) times daily.   nitroGLYCERIN 0.4 MG SL tablet Commonly known as:  NITROSTAT Place 0.4 mg under the tongue every 5 (five) minutes as needed for chest pain. Reported on 09/27/2015   omeprazole 40 MG capsule Commonly known as:  PRILOSEC Take 1 capsule (40 mg total) by mouth daily.   pravastatin 40 MG tablet Commonly known as:  PRAVACHOL Take 2 tablets (80 mg total) by mouth daily.   sitaGLIPtin 100 MG tablet Commonly known as:  JANUVIA Take 1 tablet (100 mg total) by mouth daily.   TRUE METRIX METER w/Device Kit Check blood sugar four times daily.   vitamin B-12 1000 MCG tablet Commonly known as:  CYANOCOBALAMIN Take 1 tablet (1,000 mcg total) by mouth daily.          Objective:   Physical Exam BP 112/70 (BP Location: Left Arm,  Patient Position: Sitting, Cuff Size: Normal)   Pulse 80   Temp 97.8 F (36.6 C) (Oral)   Resp 14   Ht _0  (1.702 m)   Wt 159 lb 4 oz (72.2 kg)   SpO2 93%   BMI 24.94 kg/m  General:   Well developed, well nourished . NAD.  HEENT:  Normocephalic . Has a well-healed scar at the left forehead, mild  left ptosis noted. Lungs:  CTA B Normal respiratory effort, no intercostal retractions, no accessory muscle use. Heart:  irregular,  no murmur.  No pretibial edema bilaterally  Skin: Not pale. Not jaundice Neurologic:  alert & oriented X3.  Speech normal, gait assisted by a cane and seems appropriate. Motor- decrease the strength, right foot flexion. Does not seems to affect his gait. Psych--  Cognition and judgment appear intact.  Cooperative with normal attention span and concentration.  Behavior appropriate. No anxious or depressed appearing.      Assessment & Plan:   Assessment DM, + neuropahty per exam 12-2015 CRI Creat ~ 1.3-1.4 HTN Hyperlipidemia Hypothyroidism CV: --CAD -- Dr Harrington Challenger --Atrial fibrillation, PPM --- Dr Lovena Le --AAA: [place] 06-2014 stable, was rec no further imagine  GI: Erosive gastritis, GERD, h/o anemia d/t  chronic blood loss BPH NEURO:  07-2016: Fall, vaso vagal syncope? : SAH, eld asa-warfarin Osteopenia- declined dexa 2015 B 12 deficiency  Plan: Admitted 08/08/2016 SAH: After a syncope and a head injury, neurosurgery consulted, Rx conservative treatment. Recently had a repeated CT as an outpatient and showed resolution of the hemorrhage. Check a BMP and CBC Has right foot drop, mild left ptosis since the [name]. The foot drop is not affecting ambulation.   Syncope: vasovagal? Carotid Dopplers show 1-39% B. Echo: 65% EF, grade 2 diastolic dysfunction. No further events. Right shoulder injury: Under care of orthopedics, was Rx conservative treatment Atrial fibrillation: Holding warfarin, family somewhat reluctant to discontinue it permanently. Will  discuss with cardiology in the upcoming event . Check a dig level CAD: Patient restarted aspirin right after he went back home, for now recommend to hold it at that he discuss with cardiology. DM: Avoiding tight control, last A1c 8.9, no change. Call if CBGs less than 120 rtc 3 months

## 2016-09-09 NOTE — Patient Instructions (Signed)
GO TO THE LAB : Get the blood work     GO TO THE FRONT DESK Schedule your next appointment for a  Physical in 3 months  HOLD aspirin-warfarin   Call if blood sugars are consistently less than 120

## 2016-09-09 NOTE — Progress Notes (Signed)
Pre visit review using our clinic review tool, if applicable. No additional management support is needed unless otherwise documented below in the visit note. 

## 2016-09-10 ENCOUNTER — Telehealth: Payer: Self-pay | Admitting: Internal Medicine

## 2016-09-10 ENCOUNTER — Other Ambulatory Visit: Payer: Self-pay | Admitting: Internal Medicine

## 2016-09-10 DIAGNOSIS — I10 Essential (primary) hypertension: Secondary | ICD-10-CM | POA: Diagnosis not present

## 2016-09-10 DIAGNOSIS — S46011D Strain of muscle(s) and tendon(s) of the rotator cuff of right shoulder, subsequent encounter: Secondary | ICD-10-CM | POA: Diagnosis not present

## 2016-09-10 DIAGNOSIS — I4891 Unspecified atrial fibrillation: Secondary | ICD-10-CM | POA: Diagnosis not present

## 2016-09-10 DIAGNOSIS — E119 Type 2 diabetes mellitus without complications: Secondary | ICD-10-CM | POA: Diagnosis not present

## 2016-09-10 DIAGNOSIS — S066X0D Traumatic subarachnoid hemorrhage without loss of consciousness, subsequent encounter: Secondary | ICD-10-CM | POA: Diagnosis not present

## 2016-09-10 DIAGNOSIS — S022XXD Fracture of nasal bones, subsequent encounter for fracture with routine healing: Secondary | ICD-10-CM | POA: Diagnosis not present

## 2016-09-10 DIAGNOSIS — I2510 Atherosclerotic heart disease of native coronary artery without angina pectoris: Secondary | ICD-10-CM | POA: Diagnosis not present

## 2016-09-10 LAB — DIGOXIN LEVEL: Digoxin Level: <0.5 ug/L — ABNORMAL LOW (ref 0.8–2.0)

## 2016-09-10 MED ORDER — MIRTAZAPINE 15 MG PO TABS: 15.0000 mg | ORAL_TABLET | Freq: Every day | ORAL | 0 refills | Status: DC

## 2016-09-10 NOTE — Telephone Encounter (Signed)
Pt's daughter says that pt was seen yesterday by provider. She says that nursing home prescribed Trazodone to help him sleep. Daughter would like to know if provider could provide him with something that's alike to help.    CB: [place]: [facility] [address], [place] - [place]

## 2016-09-10 NOTE — Telephone Encounter (Signed)
Please advise 

## 2016-09-10 NOTE — Telephone Encounter (Signed)
LMOM informing Leigh that Remeron 15mg  has been sent to [facility]. Instructed to call office if questions/concerns.

## 2016-09-10 NOTE — Telephone Encounter (Signed)
Okay trazodone 25 mg one at bedtime as needed #30 no refills. Watch for excessive somnolence

## 2016-09-10 NOTE — Assessment & Plan Note (Signed)
Admitted 08/08/2016 SAH: After a syncope and a head injury, neurosurgery consulted, Rx conservative treatment. Recently had a repeated CT as an outpatient and showed resolution of the hemorrhage. Check a BMP and CBC Has right foot drop, mild left ptosis since the [facility]. The foot drop is not affecting ambulation.   Syncope: vasovagal? Carotid Dopplers show 1-39% B. Echo: 65% EF, grade 2 diastolic dysfunction. No further events. Right shoulder injury: Under care of orthopedics, was Rx conservative treatment Atrial fibrillation: Holding warfarin, family somewhat reluctant to discontinue it permanently. Will discuss with cardiology in the upcoming event . Check a dig level CAD: Patient restarted aspirin right after he went back home, for now recommend to hold it at that he discuss with cardiology. DM: Avoiding tight control, last A1c 8.9, no change. Call if CBGs less than 120 rtc 3 months

## 2016-09-10 NOTE — Telephone Encounter (Signed)
Okay to try Remeron 15 mg one tablet at bedtime #30, no refills. Watch for excessive somnolence

## 2016-09-10 NOTE — Telephone Encounter (Signed)
Spoke w/ Leigh, informed that we can send Rx of Trazodone (per PCP okay to do 50mg  1/2 tablets by mouth daily prn). Leigh informed that per Pt he was receiving Trazodone 100mg  at nursing home and felt it didn't help with sleep, requesting another similar medication.

## 2016-09-11 ENCOUNTER — Other Ambulatory Visit: Payer: Self-pay

## 2016-09-11 DIAGNOSIS — I2510 Atherosclerotic heart disease of native coronary artery without angina pectoris: Secondary | ICD-10-CM | POA: Diagnosis not present

## 2016-09-11 DIAGNOSIS — E119 Type 2 diabetes mellitus without complications: Secondary | ICD-10-CM | POA: Diagnosis not present

## 2016-09-11 DIAGNOSIS — S46011D Strain of muscle(s) and tendon(s) of the rotator cuff of right shoulder, subsequent encounter: Secondary | ICD-10-CM | POA: Diagnosis not present

## 2016-09-11 DIAGNOSIS — I10 Essential (primary) hypertension: Secondary | ICD-10-CM | POA: Diagnosis not present

## 2016-09-11 DIAGNOSIS — I4891 Unspecified atrial fibrillation: Secondary | ICD-10-CM | POA: Diagnosis not present

## 2016-09-11 DIAGNOSIS — S022XXD Fracture of nasal bones, subsequent encounter for fracture with routine healing: Secondary | ICD-10-CM | POA: Diagnosis not present

## 2016-09-12 ENCOUNTER — Telehealth: Payer: Self-pay | Admitting: Internal Medicine

## 2016-09-12 DIAGNOSIS — S46011D Strain of muscle(s) and tendon(s) of the rotator cuff of right shoulder, subsequent encounter: Secondary | ICD-10-CM | POA: Diagnosis not present

## 2016-09-12 DIAGNOSIS — I2510 Atherosclerotic heart disease of native coronary artery without angina pectoris: Secondary | ICD-10-CM | POA: Diagnosis not present

## 2016-09-12 DIAGNOSIS — E119 Type 2 diabetes mellitus without complications: Secondary | ICD-10-CM | POA: Diagnosis not present

## 2016-09-12 DIAGNOSIS — S022XXD Fracture of nasal bones, subsequent encounter for fracture with routine healing: Secondary | ICD-10-CM | POA: Diagnosis not present

## 2016-09-12 DIAGNOSIS — I10 Essential (primary) hypertension: Secondary | ICD-10-CM | POA: Diagnosis not present

## 2016-09-12 DIAGNOSIS — I4891 Unspecified atrial fibrillation: Secondary | ICD-10-CM | POA: Diagnosis not present

## 2016-09-12 NOTE — Telephone Encounter (Addendum)
Spoke w/ Pt, he is currently taking 50 units of Lantus, however has not taken in last 3 days due to lower blood sugars. Instructed to hold Lantus, continue monitoring and let us know if he develops sx's of low blood sugars such as dizziness, sweating and to call in 2 weeks w/ readings. Pt verbalized understanding. Med list updated.

## 2016-09-12 NOTE — Telephone Encounter (Signed)
Patient has new phone number. Plse call [phone]

## 2016-09-12 NOTE — Telephone Encounter (Signed)
Currently on Lantus 50 units at bedtime. Decrease Lantus to 38 units Call with CBGs in 2 weeks

## 2016-09-12 NOTE — Telephone Encounter (Signed)
Please advise 

## 2016-09-12 NOTE — Telephone Encounter (Signed)
°  Relation to DY:NXGZ Call back number:[phone]  Reason for call:  Patient last seen 09/09/16 and as per AVS Call if blood sugars are consistently less than 120, pleas advise  09/12/16 69 09/11/16 82 09/10/16 78

## 2016-09-12 NOTE — Telephone Encounter (Signed)
Given low sugars without taking Lantus will hold insulin. Continue Januvia.

## 2016-09-16 ENCOUNTER — Telehealth: Payer: Self-pay | Admitting: Internal Medicine

## 2016-09-16 NOTE — Telephone Encounter (Signed)
Caller name: Nicki Reaper Relationship to patient: [facility] Can be reached: [phone] Pharmacy:  Reason for call: FYI:  Patient requested to discontinue all nursing visits but will continue to see PT and OT.

## 2016-09-16 NOTE — Telephone Encounter (Signed)
noted 

## 2016-09-16 NOTE — Telephone Encounter (Signed)
FYI

## 2016-09-17 ENCOUNTER — Encounter: Payer: Self-pay | Admitting: Physician Assistant

## 2016-09-17 ENCOUNTER — Ambulatory Visit (INDEPENDENT_AMBULATORY_CARE_PROVIDER_SITE_OTHER): Payer: Medicare HMO | Admitting: Physician Assistant

## 2016-09-17 VITALS — BP 116/60 | HR 80 | Ht 67.0 in | Wt 159.8 lb

## 2016-09-17 DIAGNOSIS — I4891 Unspecified atrial fibrillation: Secondary | ICD-10-CM | POA: Diagnosis not present

## 2016-09-17 DIAGNOSIS — S022XXD Fracture of nasal bones, subsequent encounter for fracture with routine healing: Secondary | ICD-10-CM | POA: Diagnosis not present

## 2016-09-17 DIAGNOSIS — I4821 Permanent atrial fibrillation: Secondary | ICD-10-CM

## 2016-09-17 DIAGNOSIS — R55 Syncope and collapse: Secondary | ICD-10-CM

## 2016-09-17 DIAGNOSIS — I2510 Atherosclerotic heart disease of native coronary artery without angina pectoris: Secondary | ICD-10-CM | POA: Diagnosis not present

## 2016-09-17 DIAGNOSIS — I10 Essential (primary) hypertension: Secondary | ICD-10-CM | POA: Diagnosis not present

## 2016-09-17 DIAGNOSIS — I482 Chronic atrial fibrillation: Secondary | ICD-10-CM

## 2016-09-17 DIAGNOSIS — Z950 Presence of cardiac pacemaker: Secondary | ICD-10-CM | POA: Diagnosis not present

## 2016-09-17 DIAGNOSIS — S46011D Strain of muscle(s) and tendon(s) of the rotator cuff of right shoulder, subsequent encounter: Secondary | ICD-10-CM | POA: Diagnosis not present

## 2016-09-17 DIAGNOSIS — E119 Type 2 diabetes mellitus without complications: Secondary | ICD-10-CM | POA: Diagnosis not present

## 2016-09-17 DIAGNOSIS — I609 Nontraumatic subarachnoid hemorrhage, unspecified: Secondary | ICD-10-CM | POA: Diagnosis not present

## 2016-09-17 MED ORDER — WARFARIN SODIUM 2.5 MG PO TABS: 2.5000 mg | ORAL_TABLET | Freq: Every day | ORAL | 0 refills | Status: DC

## 2016-09-17 NOTE — Progress Notes (Signed)
Cardiology Office Note:    Date:  09/17/2016   ID:  CODI KERTZ, DOB 07-Jan-1926, MRN [id number]  PCP:  Colon Branch, MD  Cardiologist:  Dr. Dorris Carnes   Electrophysiologist: Dr. Cristopher Peru   Referring MD: Colon Branch, MD   No chief complaint on file.   History of Present Illness:    [name] is a 83 y.o. male with a hx of CAD status post CABG, permanent atrial fibrillation, small AAA, HTN, HL, diabetes, hypothyroidism, anemia, symptomatic bradycardia status post pacemaker.     Admitted 3/29-4/3 with syncope and a fall complicated by subarachnoid hemorrhage, facial abrasions as well as nasal bone fracture and rotator cuff injury. Coumadin and ASA were put on hold.  I saw him after DC.  I reviewed his case with Dr. Harrington Challenger and Dr. Lovena Le.  We felt that orthostatic hypotension was the likely cause of his syncope given his negative workup.  He had a follow up CT with neurosurgery recently that demonstrated resolution of his SAH.    He returns for Cardiology follow up.  He is here with his daughter, [name].  The patient is doing well.  Since last seen, he denies chest pain, shortness of breath, syncope, orthopnea, PND or significant pedal edema.   Prior CV studies:   The following studies were reviewed today:  Echo 08/09/16 Mod conc LHV, EF 60-65, no RWMA, Gr 2 DD, mild AI, mild MR, mod LAE, mild RAE, mild TR, PASP 39   Carotid US 08/09/16 Bilat ICA 1-39   AAA [place] 2/16 Small infrarenal fusiform AAA 2.7 x 2.6 cm >> follow-up when necessary   LHC 10/04/11 LM 20 LAD 100 LCx AV groove proximal and mid stents patent RCA proximal 20, mid 99, distal 60 diffuse, 99; PLB occluded SVG-OM 2/OM3 occluded SVG-PLA/PDA patent SVG-DX patent with 40% mid LIMA-LAD patent EF 55 Impression: 1. Triple vessel CAD s/p 6V CABG with 4/6 patent bypass grafts. The Circumflex system is known to have occluded grafts but there are patent stents in this system.  2. No significant progression of CAD since last  cath in 2007.  3. Preserved LV systolic function.    Past Medical History:  Diagnosis Date  . AAA (abdominal aortic aneurysm) ([place])   . Anemia due to chronic blood loss 11/25/2008   Recurrent over the years EGD and colonoscopy x 2 each 2004 and 2010 without cause    . Atrial fibrillation ([place])   . BPH (benign prostatic hyperplasia)    reports aprocedure (TURP) remotely in [place].Marland KitchenNo futher f/u w/ urology  . CAD (coronary artery disease)   . Diabetes mellitus, type 2 ([place])   . Diverticulosis    left colon  . Erosive gastritis   . Fall 08/08/2016   OUTSIDE AT HOME FACIAL TRAUMA   . GERD (gastroesophageal reflux disease)   . Hyperlipidemia   . Hypertension   . Hypothyroidism   . Insomnia    transient  . Osteopenia    dexa 2-11  . Vitamin B12 deficiency     Past Surgical History:  Procedure Laterality Date  . CARDIAC CATHETERIZATION  01/31/06, 09/25/10, 09-2011  . CATARACT EXTRACTION     right  . CHOLECYSTECTOMY, LAPAROSCOPIC    . COLONOSCOPY     multiple  . CORONARY ARTERY BYPASS GRAFT     1999 stents in 2000  . ESOPHAGOGASTRODUODENOSCOPY     multiple  . GIVENS CAPSULE STUDY N/A 11/04/2012   Procedure: GIVENS CAPSULE STUDY;  Surgeon:  Gatha Mayer, MD;  Location: [facility];  Service: Endoscopy;  Laterality: N/A;  . inguinal herniorrhaphies     bilateral  . LEFT HEART CATHETERIZATION WITH CORONARY ANGIOGRAM N/A 10/04/2011   Procedure: LEFT HEART CATHETERIZATION WITH CORONARY ANGIOGRAM;  Surgeon: Burnell Blanks, MD;  Location: [facility] CATH LAB;  Service: Cardiovascular;  Laterality: N/A;  . pacemaker  [place], 2013   medtronic minix [id number]  . PENILE PROSTHESIS IMPLANT  1992  . PERMANENT PACEMAKER GENERATOR CHANGE N/A 06/05/2011   Procedure: PERMANENT PACEMAKER GENERATOR CHANGE;  Surgeon: Evans Lance, MD;  Location: [facility] CATH LAB;  Service: Cardiovascular;  Laterality: N/A;  . PROSTATECTOMY     transurethral  . right hip replacement    . TRANSTHORACIC ECHOCARDIOGRAM   12/2006    Current Medications: Current Meds  Medication Sig  . acetaminophen (TYLENOL) 325 MG tablet Take 650 mg by mouth every 6 (six) hours as needed for mild pain or moderate pain.   [name] amoxicillin (AMOXIL) 500 MG capsule Take 2,000 mg by mouth See admin instructions. ONE HOUR PRIOR TO EACH DENTAL APPOINTMENT  . digoxin (LANOXIN) 0.125 MG tablet Take 0.5 tablets (0.0625 mg total) by mouth daily.  . fenofibrate micronized (LOFIBRA) 134 MG capsule Take 1 capsule (134 mg total) by mouth daily.  . insulin glargine (LANTUS) 100 UNIT/ML injection Inject 0.38 mLs (38 Units total) into the skin at bedtime.  . Insulin Pen Needle 31G X 6 MM MISC To use w/ Lantus  . latanoprost (XALATAN) 0.005 % ophthalmic solution Place 1 drop into both eyes at bedtime.  [name] levothyroxine (SYNTHROID, LEVOTHROID) 88 MCG tablet Take 1 tablet (88 mcg total) by mouth daily before breakfast.  . metoprolol tartrate (LOPRESSOR) 25 MG tablet Take 25 mg by mouth 2 (two) times daily.   . mirtazapine (REMERON) 15 MG tablet Take 1 tablet (15 mg total) by mouth at bedtime.  . nitroGLYCERIN (NITROSTAT) 0.4 MG SL tablet Place 0.4 mg under the tongue every 5 (five) minutes as needed for chest pain. Reported on 09/27/2015  . omeprazole (PRILOSEC) 40 MG capsule Take 1 capsule (40 mg total) by mouth daily.  . pravastatin (PRAVACHOL) 40 MG tablet Take 2 tablets (80 mg total) by mouth daily.  . sitaGLIPtin (JANUVIA) 100 MG tablet Take 1 tablet (100 mg total) by mouth daily.  . vitamin B-12 (CYANOCOBALAMIN) 1000 MCG tablet Take 1 tablet (1,000 mcg total) by mouth daily.     Allergies:   Ace inhibitors; Codeine phosphate; and Hydrochlorothiazide w-triamterene   Social History   Social History  . Marital status: Legally Separated    Spouse name: N/A  . Number of children: 5  . Years of education: N/A   Occupational History  . retired Retired   Social History Main Topics  . Smoking status: Never Smoker  . Smokeless tobacco: Never  Used  . Alcohol use 0.6 - 1.2 oz/week    1 - 2 Glasses of wine per week     Comment: socially   . Drug use: No  . Sexual activity: No   Other Topics Concern  . None   Social History Narrative   Lives by self, independent on ADL, retired. Separated from wife.    Daughters:   [name] ( lives in [place]) [phone]   [name] ( lives out of town)  [phone]           Family History  Problem Relation Age of Onset  . Alzheimer's disease Mother   .  Heart failure Father   . CVA Father   . Coronary artery disease Brother     cabg  . Alzheimer's disease Sister   . Mental illness Sister   . Alzheimer's disease Sister   . Other Sister     lung problems  . Diabetes Other     several siblings  . Prostate cancer Neg Hx   . Colon cancer Neg Hx   . Esophageal cancer Neg Hx   . Stomach cancer Neg Hx   . Rectal cancer Neg Hx      ROS:   Please see the history of present illness.    ROS All other systems reviewed and are negative.   EKGs/Labs/Other Test Reviewed:    EKG:  EKG is not ordered today.  The ekg ordered today demonstrates n/a  Recent Labs: 09/27/2015: ALT 13 06/18/2016: TSH 0.73 09/09/2016: BUN 19; Creatinine, Ser 1.24; Hemoglobin 14.4; Platelets 179.0; Potassium 4.3; Sodium 136   Recent Lipid Panel    Component Value Date/Time   CHOL 144 09/27/2015 1232   TRIG (H) 09/27/2015 1232    411.0 Triglyceride is over 400; calculations on Lipids are invalid.   TRIG 113 03/26/2006 1118   HDL 20.50 (L) 09/27/2015 1232   CHOLHDL 7 09/27/2015 1232   VLDL 51.2 (H) 10/28/2013 1431   LDLCALC 63 10/28/2013 1431   LDLDIRECT 74.0 09/27/2015 1232     Physical Exam:    VS:  BP 116/60   Pulse 80   Ht 5\' 7"  (1.702 m)   Wt 159 lb 12.8 oz (72.5 kg)   BMI 25.03 kg/m     Wt Readings from Last 3 Encounters:  09/17/16 159 lb 12.8 oz (72.5 kg)  09/09/16 159 lb 4 oz (72.2 kg)  08/28/16 164 lb (74.4 kg)     Physical Exam  Constitutional: He is oriented to person, place, and  time. He appears well-developed and well-nourished. No distress.  HENT:  Head: Normocephalic and atraumatic.  Eyes: No scleral icterus.  Neck: Normal range of motion. No JVD present.  Cardiovascular: Normal rate, S1 normal and S2 normal.  An irregularly irregular rhythm present.  No murmur heard. Pulmonary/Chest: Effort normal and breath sounds normal. He has no wheezes. He has no rhonchi. He has no rales.  Abdominal: Soft. There is no tenderness.  Musculoskeletal: He exhibits no edema.  Neurological: He is alert and oriented to person, place, and time.  Skin: Skin is warm and dry.  Psychiatric: He has a normal mood and affect.    ASSESSMENT:    1. Syncope, unspecified syncope type   2. SAH (subarachnoid hemorrhage) (HCC)   3. Permanent atrial fibrillation ([place])   4. Coronary artery disease involving native coronary artery of native heart without angina pectoris   5. Cardiac pacemaker in situ    PLAN:    In order of problems listed above:  1. Syncope, unspecified syncope type - Syncope was likely related to orthostatic hypotension.  We discussed the importance of hydration.  2. SAH (subarachnoid hemorrhage) (HCC) - Recent CT with resolution of SAH.  3. Permanent atrial fibrillation (HCC) - CHADS2-VASc=5 (83 yo, CAD, HTN, DM).  He is high risk for stroke without anticoagulation. However, he is off of anticoagulation due to recent [facility].  CT did show resolution of SAH.  I called his neurosurgeon, Dr. Cyndy Freeze, who felt that he could resume Coumadin from his standpoint.  -  We will contact the patient to resume Coumadin  -  He will follow  up with his PCP who previously managed his INR   4. Coronary artery disease involving native coronary artery of native heart without angina pectoris - s/p CABG.  LHC in 2013 with 4/6 grafts patent.  No angina. EF by echo 3/18 was normal.  As coumadin will be resumed, will hold off on restarting ASA to reduce risk of bleeding.  Continue beta-blocker,  statin.   5. Cardiac pacemaker in situ - FU with EP as planned.    Dispo:  Return in about 4 months (around 01/18/2017) for Routine Follow Up, w/ Dr. Harrington Challenger.   Medication Adjustments/Labs and Tests Ordered: Current medicines are reviewed at length with the patient today.  Concerns regarding medicines are outlined above.  Orders/Tests:  No orders of the defined types were placed in this encounter.  Medication changes: No orders of the defined types were placed in this encounter.  Signed, Richardson Dopp, PA-C  09/17/2016 1:31 PM    [facility] [place], [place],   [address] Phone: [phone]; Fax: [phone]

## 2016-09-17 NOTE — Patient Instructions (Addendum)
Medication Instructions:  Your physician recommends that you continue on your current medications as directed. Please refer to the Current Medication list given to you today.  Labwork: NONE ORDERED  Testing/Procedures: NONE ORDERED  Follow-Up: DR. Harrington Challenger 3-4 MONTHS   Any Other Special Instructions Will Be Listed Below (If Applicable).  If you need a refill on your cardiac medications before your next appointment, please call your pharmacy.

## 2016-09-17 NOTE — Addendum Note (Signed)
Addended by: Michae Kava on: 09/17/2016 02:00 PM   Modules accepted: Orders

## 2016-09-18 ENCOUNTER — Encounter: Payer: Medicare HMO | Admitting: Internal Medicine

## 2016-09-18 ENCOUNTER — Telehealth: Payer: Self-pay | Admitting: Internal Medicine

## 2016-09-18 NOTE — Telephone Encounter (Addendum)
Patient saw cardiology yesterday, to restart Coumadin. Advice to reassume previous coumadin dose, per my review is as follows: Coumadin 3 mg daily, except take 1.5 mg on Wednesdays and Saturdays.  Has an appointment already for next week

## 2016-09-19 DIAGNOSIS — I2510 Atherosclerotic heart disease of native coronary artery without angina pectoris: Secondary | ICD-10-CM | POA: Diagnosis not present

## 2016-09-19 DIAGNOSIS — E119 Type 2 diabetes mellitus without complications: Secondary | ICD-10-CM | POA: Diagnosis not present

## 2016-09-19 DIAGNOSIS — I4891 Unspecified atrial fibrillation: Secondary | ICD-10-CM | POA: Diagnosis not present

## 2016-09-19 DIAGNOSIS — I10 Essential (primary) hypertension: Secondary | ICD-10-CM | POA: Diagnosis not present

## 2016-09-19 DIAGNOSIS — S022XXD Fracture of nasal bones, subsequent encounter for fracture with routine healing: Secondary | ICD-10-CM | POA: Diagnosis not present

## 2016-09-19 DIAGNOSIS — S46011D Strain of muscle(s) and tendon(s) of the rotator cuff of right shoulder, subsequent encounter: Secondary | ICD-10-CM | POA: Diagnosis not present

## 2016-09-19 NOTE — Telephone Encounter (Signed)
LMOM informing Pt to return call.  

## 2016-09-23 ENCOUNTER — Ambulatory Visit (INDEPENDENT_AMBULATORY_CARE_PROVIDER_SITE_OTHER): Payer: Medicare HMO | Admitting: Internal Medicine

## 2016-09-23 ENCOUNTER — Encounter: Payer: Self-pay | Admitting: Internal Medicine

## 2016-09-23 VITALS — BP 126/74 | HR 78 | Temp 98.1°F | Resp 14 | Ht 67.0 in | Wt 161.5 lb

## 2016-09-23 DIAGNOSIS — I482 Chronic atrial fibrillation: Secondary | ICD-10-CM

## 2016-09-23 DIAGNOSIS — E118 Type 2 diabetes mellitus with unspecified complications: Secondary | ICD-10-CM

## 2016-09-23 DIAGNOSIS — I4821 Permanent atrial fibrillation: Secondary | ICD-10-CM

## 2016-09-23 LAB — POCT INR: INR: 1.2

## 2016-09-23 NOTE — Progress Notes (Signed)
Subjective:    Patient ID: [name], male    DOB: 04/16/1926, 83 y.o.   MRN: [id number]  DOS:  09/23/2016 Type of visit - description : f/u Interval history: Since the last visit, he reported insomnia, on Remeron, good results. Good compliance with Coumadin. DM: Still taking insulin. Saw cardiology, syncope felt to be due to orthostatic low BP. Shoulder pain: Was doing okay until yesterday, now the pain is more severe with arm motion.   Review of Systems   Past Medical History:  Diagnosis Date  . AAA (abdominal aortic aneurysm) ([place])   . Anemia due to chronic blood loss 11/25/2008   Recurrent over the years EGD and colonoscopy x 2 each 2004 and 2010 without cause    . Atrial fibrillation ([place])   . BPH (benign prostatic hyperplasia)    reports aprocedure (TURP) remotely in [place].Marland KitchenNo futher f/u w/ urology  . CAD (coronary artery disease)   . Diabetes mellitus, type 2 ([place])   . Diverticulosis    left colon  . Erosive gastritis   . Fall 08/08/2016   OUTSIDE AT HOME FACIAL TRAUMA   . GERD (gastroesophageal reflux disease)   . Hyperlipidemia   . Hypertension   . Hypothyroidism   . Insomnia    transient  . Osteopenia    dexa 2-11  . Vitamin B12 deficiency     Past Surgical History:  Procedure Laterality Date  . CARDIAC CATHETERIZATION  01/31/06, 09/25/10, 09-2011  . CATARACT EXTRACTION     right  . CHOLECYSTECTOMY, LAPAROSCOPIC    . COLONOSCOPY     multiple  . CORONARY ARTERY BYPASS GRAFT     1999 stents in 2000  . ESOPHAGOGASTRODUODENOSCOPY     multiple  . GIVENS CAPSULE STUDY N/A 11/04/2012   Procedure: GIVENS CAPSULE STUDY;  Surgeon: Gatha Mayer, MD;  Location: [facility] ENDOSCOPY;  Service: Endoscopy;  Laterality: N/A;  . inguinal herniorrhaphies     bilateral  . LEFT HEART CATHETERIZATION WITH CORONARY ANGIOGRAM N/A 10/04/2011   Procedure: LEFT HEART CATHETERIZATION WITH CORONARY ANGIOGRAM;  Surgeon: Burnell Blanks, MD;  Location: [facility] CATH LAB;  Service:  Cardiovascular;  Laterality: N/A;  . pacemaker  [place], 2013   medtronic minix 8341  . PENILE PROSTHESIS IMPLANT  1992  . PERMANENT PACEMAKER GENERATOR CHANGE N/A 06/05/2011   Procedure: PERMANENT PACEMAKER GENERATOR CHANGE;  Surgeon: Evans Lance, MD;  Location: [facility] CATH LAB;  Service: Cardiovascular;  Laterality: N/A;  . PROSTATECTOMY     transurethral  . right hip replacement    . TRANSTHORACIC ECHOCARDIOGRAM  12/2006    Social History   Social History  . Marital status: Legally Separated    Spouse name: N/A  . Number of children: 5  . Years of education: N/A   Occupational History  . retired Retired   Social History Main Topics  . Smoking status: Never Smoker  . Smokeless tobacco: Never Used  . Alcohol use 0.6 - 1.2 oz/week    1 - 2 Glasses of wine per week     Comment: socially   . Drug use: No  . Sexual activity: No   Other Topics Concern  . Not on file   Social History Narrative   Lives by self, independent on ADL, retired. Separated from wife.    Daughters:   [name] ( lives in [place]) [phone]   [name] ( lives out of town)  [phone]  Allergies as of 09/23/2016      Reactions   Ace Inhibitors Cough   Codeine Phosphate Nausea Only   Hydrochlorothiazide W-triamterene Other (See Comments)   CRAMPING      Medication List       Accurate as of 09/23/16 11:59 PM. Always use your most recent med list.          acetaminophen 325 MG tablet Commonly known as:  TYLENOL Take 650 mg by mouth every 6 (six) hours as needed for mild pain or moderate pain.   amoxicillin 500 MG capsule Commonly known as:  AMOXIL Take 2,000 mg by mouth See admin instructions. ONE HOUR PRIOR TO EACH DENTAL APPOINTMENT   digoxin 0.125 MG tablet Commonly known as:  LANOXIN Take 0.5 tablets (0.0625 mg total) by mouth daily.   fenofibrate micronized 134 MG capsule Commonly known as:  LOFIBRA Take 1 capsule (134 mg total) by mouth daily.   insulin glargine  100 UNIT/ML injection Commonly known as:  LANTUS Inject 0.38 mLs (38 Units total) into the skin at bedtime.   Insulin Pen Needle 31G X 6 MM Misc To use w/ Lantus   latanoprost 0.005 % ophthalmic solution Commonly known as:  XALATAN Place 1 drop into both eyes at bedtime.   levothyroxine 88 MCG tablet Commonly known as:  SYNTHROID, LEVOTHROID Take 1 tablet (88 mcg total) by mouth daily before breakfast.   metoprolol tartrate 25 MG tablet Commonly known as:  LOPRESSOR Take 25 mg by mouth 2 (two) times daily.   mirtazapine 15 MG tablet Commonly known as:  REMERON Take 1 tablet (15 mg total) by mouth at bedtime.   nitroGLYCERIN 0.4 MG SL tablet Commonly known as:  NITROSTAT Place 0.4 mg under the tongue every 5 (five) minutes as needed for chest pain. Reported on 09/27/2015   omeprazole 40 MG capsule Commonly known as:  PRILOSEC Take 1 capsule (40 mg total) by mouth daily.   pravastatin 40 MG tablet Commonly known as:  PRAVACHOL Take 2 tablets (80 mg total) by mouth daily.   sitaGLIPtin 100 MG tablet Commonly known as:  JANUVIA Take 1 tablet (100 mg total) by mouth daily.   vitamin B-12 1000 MCG tablet Commonly known as:  CYANOCOBALAMIN Take 1 tablet (1,000 mcg total) by mouth daily.   warfarin 2.5 MG tablet Commonly known as:  COUMADIN Take 1 tablet (2.5 mg total) by mouth daily.          Objective:   Physical Exam BP 126/74 (BP Location: Left Arm, Patient Position: Sitting, Cuff Size: Small)   Pulse 78   Temp 98.1 F (36.7 C) (Oral)   Resp 14   Ht 5\' 7"  (1.702 m)   Wt 161 lb 8 oz (73.3 kg)   SpO2 96%   BMI 25.29 kg/m  General:   Well developed, well nourished . NAD.  HEENT:  Normocephalic . Face symmetric, atraumatic MSK: Right shoulder range of motion quite limited particularly limited is the arm elevation Skin: Not pale. Not jaundice Neurologic:  alert & oriented X3.  Speech normal, gait appropriate for age and unassisted Psych--  Cognition and  judgment appear intact.  Cooperative with normal attention span and concentration.  Behavior appropriate. No anxious or depressed appearing.      Assessment & Plan:    Assessment DM, + neuropahty per exam 12-2015 CRI Creat ~ 1.3-1.4 HTN Hyperlipidemia Hypothyroidism CV: --CAD -- Dr Harrington Challenger --Atrial fibrillation, PPM --- Dr Lovena Le --AAA: [place] 06-2014 stable, was rec no further  imagine  GI: Erosive gastritis, GERD, h/o anemia d/t  chronic blood loss BPH NEURO:  07-2016: Fall, vaso vagal syncope? : SAH, eld asa-warfarin Osteopenia- declined dexa 2015 B 12 deficiency  Plan: DM: Since the last visit, he was recommended to stop insulin because low blood sugar but states he takes  insulin the morning if his CBG is in the 160s. After we talk about the issue, I recommend to take insulin 38 units daily, to call if CBGs are low. Overall, our main goal is to prevent hypoglycemia and the CBG goals are as follows: Fasting before a meal 120s 2 hours after a meal less than 200 At bedtime 120s Shoulder pain: Was doing okay until yesterday. Range of motion is decreased today. Recommend an control with Tylenol only, see instructions. Continue with PT/OT. If not improving is to see orthopedic again. Coumadin management: Taking Coumadin 2.5 daily, INR is low today but he just restarted Coumadin a week ago. Rec  Continue Coumadin 2.5 mg daily, recheck in 2 weeks.  F2F > 15 min

## 2016-09-23 NOTE — Progress Notes (Signed)
Pre visit review using our clinic review tool, if applicable. No additional management support is needed unless otherwise documented below in the visit note. 

## 2016-09-23 NOTE — Patient Instructions (Addendum)
Continue taking Coumadin 2.5 mg every day  Next Coumadin check 2 weeks from today  Tylenol  500 mg OTC 2 tabs a day every 8 hours as needed for pain  ICE  the shoulder once or twice a day  Avoid taking Motrin, ibuprofen, naproxen, Advil  Continue Lantus 38 units every morning  Diabetes: Check your blood sugar  once or twice a day   GOALS: Fasting before a meal 120s 2 hours after a meal less than 200 At bedtime 120s Call if consistently not at goal

## 2016-09-24 NOTE — Assessment & Plan Note (Signed)
DM: Since the last visit, he was recommended to stop insulin because low blood sugar but states he takes  insulin the morning if his CBG is in the 160s. After we talk about the issue, I recommend to take insulin 38 units daily, to call if CBGs are low. Overall, our main goal is to prevent hypoglycemia and the CBG goals are as follows: Fasting before a meal 120s 2 hours after a meal less than 200 At bedtime 120s Shoulder pain: Was doing okay until yesterday. Range of motion is decreased today. Recommend an control with Tylenol only, see instructions. Continue with PT/OT. If not improving is to see orthopedic again. Coumadin management: Taking Coumadin 2.5 daily, INR is low today but he just restarted Coumadin a week ago. Rec  Continue Coumadin 2.5 mg daily, recheck in 2 weeks.

## 2016-09-25 DIAGNOSIS — E119 Type 2 diabetes mellitus without complications: Secondary | ICD-10-CM | POA: Diagnosis not present

## 2016-09-25 DIAGNOSIS — I10 Essential (primary) hypertension: Secondary | ICD-10-CM | POA: Diagnosis not present

## 2016-09-25 DIAGNOSIS — S46011D Strain of muscle(s) and tendon(s) of the rotator cuff of right shoulder, subsequent encounter: Secondary | ICD-10-CM | POA: Diagnosis not present

## 2016-09-25 DIAGNOSIS — S022XXD Fracture of nasal bones, subsequent encounter for fracture with routine healing: Secondary | ICD-10-CM | POA: Diagnosis not present

## 2016-09-25 DIAGNOSIS — I2510 Atherosclerotic heart disease of native coronary artery without angina pectoris: Secondary | ICD-10-CM | POA: Diagnosis not present

## 2016-09-25 DIAGNOSIS — I4891 Unspecified atrial fibrillation: Secondary | ICD-10-CM | POA: Diagnosis not present

## 2016-10-08 ENCOUNTER — Ambulatory Visit (INDEPENDENT_AMBULATORY_CARE_PROVIDER_SITE_OTHER): Payer: Medicare HMO | Admitting: Behavioral Health

## 2016-10-08 DIAGNOSIS — I4891 Unspecified atrial fibrillation: Secondary | ICD-10-CM

## 2016-10-08 LAB — POCT INR: INR: 2

## 2016-10-08 NOTE — Patient Instructions (Signed)
Per [name]: Continue taking Coumadin 2.5 mg daily. Return in 4 weeks for INR check.

## 2016-10-08 NOTE — Progress Notes (Addendum)
Pre visit review using our clinic review tool, if applicable. No additional management support is needed unless otherwise documented below in the visit note.  Patient came in clinic for INR check. He reported no changes or positive findings. Patient voiced compliance with medication & current regimen. INR reading during today's visit was 2.0.  Per [name]: Continue taking Coumadin 2.5 mg daily. Return in 4 weeks for INR check.  Patient was made aware of the provider's recommendations & verbalized understanding. Next appointment scheduled for 11/05/16 at 3:00 PM. Kathlene November, MD

## 2016-10-14 ENCOUNTER — Other Ambulatory Visit: Payer: Self-pay | Admitting: Physician Assistant

## 2016-10-21 ENCOUNTER — Telehealth: Payer: Self-pay | Admitting: *Deleted

## 2016-10-21 NOTE — Telephone Encounter (Signed)
Received patient /Transfer/Discharge [employer] Report; No signature needed, forwarded to provider/SLS 06/11

## 2016-11-05 ENCOUNTER — Ambulatory Visit (INDEPENDENT_AMBULATORY_CARE_PROVIDER_SITE_OTHER): Payer: Medicare HMO

## 2016-11-05 DIAGNOSIS — Z7901 Long term (current) use of anticoagulants: Secondary | ICD-10-CM

## 2016-11-05 LAB — POCT INR: INR: 4.1

## 2016-11-05 NOTE — Progress Notes (Signed)
INR today 4.1. Coumadin dose has been the same for a while, 2.5 mg daily. Plan: Skip 4 doses of Coumadin. Then continue 2.5 mg daily Watch closely for signs of bleeding. Recheck in 2 weeks JP

## 2016-11-05 NOTE — Patient Instructions (Signed)
Per [name] patient to hold next 4 doses of Coumadin,then resume 2.5 mg daily and return for INR re-check in 2 weeks.

## 2016-11-05 NOTE — Progress Notes (Signed)
Pre visit review using our clinic tool,if applicable. No additional management support is needed unless otherwise documented below in the visit note.   Patient in for INR check per order from [name].   Patient denies any missed doses, bleeding,bruising. States he has not had an excessive amount of green leafy vegetables. Last INR = 2.0  Patient states he takes Coumadin 2.5 mg daily.  INR today =   4.1 repeated  =4.2  Goal = 2.0 to 3.0.  Per [name] patient to hold next 4 doses of Coumadin,then resume 2.5 mg daily and return for INR re-check in 2 weeks.  Kathlene November, MD

## 2016-11-09 ENCOUNTER — Other Ambulatory Visit: Payer: Self-pay | Admitting: Internal Medicine

## 2016-11-20 ENCOUNTER — Ambulatory Visit: Payer: Medicare HMO

## 2016-11-28 ENCOUNTER — Telehealth: Payer: Self-pay | Admitting: Internal Medicine

## 2016-11-28 NOTE — Telephone Encounter (Signed)
Overdue for an INR check, please arrange 

## 2016-11-29 ENCOUNTER — Encounter (HOSPITAL_COMMUNITY): Payer: Self-pay

## 2016-11-29 ENCOUNTER — Emergency Department (HOSPITAL_COMMUNITY): Payer: Medicare HMO

## 2016-11-29 ENCOUNTER — Emergency Department (HOSPITAL_COMMUNITY)
Admission: EM | Admit: 2016-11-29 | Discharge: 2016-11-29 | Disposition: A | Discharge status: Home / Self Care | Payer: Medicare HMO | Attending: Emergency Medicine | Admitting: Emergency Medicine

## 2016-11-29 DIAGNOSIS — I493 Ventricular premature depolarization: Secondary | ICD-10-CM | POA: Diagnosis not present

## 2016-11-29 DIAGNOSIS — Z794 Long term (current) use of insulin: Secondary | ICD-10-CM | POA: Insufficient documentation

## 2016-11-29 DIAGNOSIS — I2510 Atherosclerotic heart disease of native coronary artery without angina pectoris: Secondary | ICD-10-CM | POA: Insufficient documentation

## 2016-11-29 DIAGNOSIS — E119 Type 2 diabetes mellitus without complications: Secondary | ICD-10-CM | POA: Diagnosis not present

## 2016-11-29 DIAGNOSIS — Z950 Presence of cardiac pacemaker: Secondary | ICD-10-CM | POA: Diagnosis not present

## 2016-11-29 DIAGNOSIS — D649 Anemia, unspecified: Secondary | ICD-10-CM | POA: Diagnosis not present

## 2016-11-29 DIAGNOSIS — Z7901 Long term (current) use of anticoagulants: Secondary | ICD-10-CM | POA: Insufficient documentation

## 2016-11-29 DIAGNOSIS — Z79899 Other long term (current) drug therapy: Secondary | ICD-10-CM | POA: Insufficient documentation

## 2016-11-29 DIAGNOSIS — E039 Hypothyroidism, unspecified: Secondary | ICD-10-CM | POA: Diagnosis not present

## 2016-11-29 DIAGNOSIS — M25512 Pain in left shoulder: Secondary | ICD-10-CM | POA: Diagnosis not present

## 2016-11-29 DIAGNOSIS — I10 Essential (primary) hypertension: Secondary | ICD-10-CM | POA: Insufficient documentation

## 2016-11-29 DIAGNOSIS — S299XXA Unspecified injury of thorax, initial encounter: Secondary | ICD-10-CM | POA: Diagnosis not present

## 2016-11-29 DIAGNOSIS — M79622 Pain in left upper arm: Secondary | ICD-10-CM | POA: Diagnosis not present

## 2016-11-29 LAB — CBC
HEMATOCRIT: 40.8 % (ref 39.0–52.0)
Hemoglobin: 13.5 g/dL (ref 13.0–17.0)
MCH: 32 pg (ref 26.0–34.0)
MCHC: 33.1 g/dL (ref 30.0–36.0)
MCV: 96.7 fL (ref 78.0–100.0)
Platelets: 183 10*3/uL (ref 150–400)
RBC: 4.22 MIL/uL (ref 4.22–5.81)
RDW: 13.7 % (ref 11.5–15.5)
WBC: 12.5 10*3/uL — AB (ref 4.0–10.5)

## 2016-11-29 LAB — I-STAT TROPONIN, ED: Troponin i, poc: 0 ng/mL (ref 0.00–0.08)

## 2016-11-29 LAB — BASIC METABOLIC PANEL
ANION GAP: 9 (ref 5–15)
BUN: 24 mg/dL — AB (ref 6–20)
CHLORIDE: 102 mmol/L (ref 101–111)
CO2: 23 mmol/L (ref 22–32)
Calcium: 9 mg/dL (ref 8.9–10.3)
Creatinine, Ser: 1.32 mg/dL — ABNORMAL HIGH (ref 0.61–1.24)
GFR, EST AFRICAN AMERICAN: 54 mL/min — AB (ref >60)
GFR, EST NON AFRICAN AMERICAN: 46 mL/min — AB (ref >60)
Glucose, Bld: 427 mg/dL — ABNORMAL HIGH (ref 65–99)
POTASSIUM: 5 mmol/L (ref 3.5–5.1)
SODIUM: 134 mmol/L — AB (ref 135–145)

## 2016-11-29 MED ORDER — OXYCODONE-ACETAMINOPHEN 5-325 MG PO TABS: 1.0000 | ORAL_TABLET | Freq: Four times a day (QID) | ORAL | 0 refills | Status: DC | PRN

## 2016-11-29 MED ORDER — MORPHINE SULFATE (PF) 4 MG/ML IV SOLN
4.0000 mg | Freq: Once | INTRAVENOUS | Status: AC
  Administered 2016-11-29: 4 mg via INTRAVENOUS
  Filled 2016-11-29: qty 1

## 2016-11-29 MED ORDER — ONDANSETRON HCL 4 MG/2ML IJ SOLN
4.0000 mg | Freq: Once | INTRAMUSCULAR | Status: AC
  Administered 2016-11-29: 4 mg via INTRAVENOUS
  Filled 2016-11-29: qty 2

## 2016-11-29 NOTE — Telephone Encounter (Signed)
CPE scheduled 12/09/2016 w/ note for INR to be completed at that time.

## 2016-11-29 NOTE — ED Triage Notes (Signed)
Pt states he had a fall in March and has had shoulder pain. Pt states since Wednesday he has had shoulder pain and some nausea. Pt has extensive cardiac hx.

## 2016-11-29 NOTE — Telephone Encounter (Signed)
Schedule ann appointment for 12-02-16, he is overdue, last INR elevated! If he refuses will just have to do it on the 30th

## 2016-11-29 NOTE — ED Provider Notes (Addendum)
[facility] DEPT Provider Note   CSN: [id number] Arrival date & time: 11/29/16  2549     History   Chief Complaint Chief Complaint  Patient presents with  . Shoulder Pain    HPI [name] is a 83 y.o. male.  Patient is an 83 year old male with extensive past medical history including CABG, pacemaker placement, diabetes, hypertension. He presents today for evaluation of left shoulder pain. This began yesterday. He denies any injury or trauma, but does report a fall one month ago. He was evaluated, however no definite injury was found. He reports severe pain in the left shoulder that feels different from prior cardiac pain. He denies any shortness of breath but does feel nauseated. He denies any numbness, tingling, or weakness of the left arm.   The history is provided by the patient.  Shoulder Pain   This is a new problem. The current episode started yesterday. The problem occurs constantly. The problem has been rapidly worsening. The pain is present in the left shoulder. The quality of the pain is described as sharp. The pain is severe. Associated symptoms include limited range of motion. Pertinent negatives include no numbness and no tingling. Treatments tried: Tylenol. The treatment provided no relief.    Past Medical History:  Diagnosis Date  . AAA (abdominal aortic aneurysm) ([place])   . Anemia due to chronic blood loss 11/25/2008   Recurrent over the years EGD and colonoscopy x 2 each 2004 and 2010 without cause    . Atrial fibrillation ([place])   . BPH (benign prostatic hyperplasia)    reports aprocedure (TURP) remotely in [place].Marland KitchenNo futher f/u w/ urology  . CAD (coronary artery disease)   . Diabetes mellitus, type 2 ([place])   . Diverticulosis    left colon  . Erosive gastritis   . Fall 08/08/2016   OUTSIDE AT HOME FACIAL TRAUMA   . GERD (gastroesophageal reflux disease)   . Hyperlipidemia   . Hypertension   . Hypothyroidism   . Insomnia    transient  . Osteopenia    dexa 2-11  . Vitamin B12 deficiency     Patient Active Problem List   Diagnosis Date Noted  . Rotator cuff tear, right 08/18/2016  . HLD (hyperlipidemia) 08/18/2016  . Nasal bone fracture 08/18/2016  . Syncope and collapse 08/08/2016  . SAH (subarachnoid hemorrhage) ([place]) 08/08/2016  . PCP NOTES >>>>>>>>>>>>>>>>>>>> 09/28/2015  . Vasomotor rhinitis 09/14/2014  . Cough 03/01/2014  . Right shoulder injury 03/15/2013  . Hiatal hernia 01/28/2013  . Erosive gastritis 01/28/2013  . Warfarin anticoagulation 01/28/2013  . Medicare annual wellness visit, subsequent 07/14/2012  . BPH (benign prostatic hyperplasia) 06/07/2009  . Osteopenia 06/07/2009  . Anemia due to chronic blood loss 11/25/2008  . Vitamin B 12 deficiency 06/01/2008  . HTN (hypertension) 06/01/2008  . PACEMAKER, PERMANENT 06/01/2008  . GERD 03/31/2007  . Hypothyroidism 01/26/2007  . DM (diabetes mellitus) with complications ([place]) 82/64/1583  . Permanent atrial fibrillation ([place]) 01/26/2007  . Dyslipidemia 10/30/2006  . Coronary artery disease involving native coronary artery of native heart without angina pectoris 10/02/2006  . HIP REPLACEMENT, RIGHT, HX OF 02/04/2003    Past Surgical History:  Procedure Laterality Date  . CARDIAC CATHETERIZATION  01/31/06, 09/25/10, 09-2011  . CATARACT EXTRACTION     right  . CHOLECYSTECTOMY, LAPAROSCOPIC    . COLONOSCOPY     multiple  . CORONARY ARTERY BYPASS GRAFT     1999 stents in 2000  . ESOPHAGOGASTRODUODENOSCOPY  multiple  . GIVENS CAPSULE STUDY N/A 11/04/2012   Procedure: GIVENS CAPSULE STUDY;  Surgeon: Gatha Mayer, MD;  Location: [facility] ENDOSCOPY;  Service: Endoscopy;  Laterality: N/A;  . inguinal herniorrhaphies     bilateral  . LEFT HEART CATHETERIZATION WITH CORONARY ANGIOGRAM N/A 10/04/2011   Procedure: LEFT HEART CATHETERIZATION WITH CORONARY ANGIOGRAM;  Surgeon: Burnell Blanks, MD;  Location: [facility] CATH LAB;  Service: Cardiovascular;  Laterality: N/A;  .  pacemaker  [place], 2013   medtronic minix [id number]  . PENILE PROSTHESIS IMPLANT  1992  . PERMANENT PACEMAKER GENERATOR CHANGE N/A 06/05/2011   Procedure: PERMANENT PACEMAKER GENERATOR CHANGE;  Surgeon: Evans Lance, MD;  Location: [facility] CATH LAB;  Service: Cardiovascular;  Laterality: N/A;  . PROSTATECTOMY     transurethral  . right hip replacement    . TRANSTHORACIC ECHOCARDIOGRAM  12/2006       Home Medications    Prior to Admission medications   Medication Sig Start Date End Date Taking? Authorizing Provider  acetaminophen (TYLENOL) 325 MG tablet Take 650 mg by mouth every 6 (six) hours as needed for mild pain or moderate pain.     [provider]  amoxicillin (AMOXIL) 500 MG capsule Take 2,000 mg by mouth See admin instructions. ONE HOUR PRIOR TO [place]    [provider]  digoxin (LANOXIN) 0.125 MG tablet Take 0.5 tablets (0.0625 mg total) by mouth daily. 06/17/16   Evans Lance, MD  fenofibrate micronized (LOFIBRA) 134 MG capsule Take 1 capsule (134 mg total) by mouth daily. 07/17/16   Colon Branch, MD  insulin glargine (LANTUS) 100 UNIT/ML injection Inject 0.38 mLs (38 Units total) into the skin at bedtime. 09/12/16   Colon Branch, MD  Insulin Pen Needle 31G X 6 MM MISC To use w/ Lantus 10/02/15   Colon Branch, MD  latanoprost (XALATAN) 0.005 % ophthalmic solution Place 1 drop into both eyes at bedtime. 06/07/16   [provider]  levothyroxine (SYNTHROID, LEVOTHROID) 88 MCG tablet Take 1 tablet (88 mcg total) by mouth daily before breakfast. 07/19/16   Colon Branch, MD  metoprolol tartrate (LOPRESSOR) 25 MG tablet Take 25 mg by mouth 2 (two) times daily.     [provider]  mirtazapine (REMERON) 15 MG tablet Take 1 tablet (15 mg total) by mouth at bedtime. 11/11/16   Colon Branch, MD  nitroGLYCERIN (NITROSTAT) 0.4 MG SL tablet Place 0.4 mg under the tongue every 5 (five) minutes as needed for chest pain. Reported on 09/27/2015    [provider]  omeprazole (PRILOSEC) 40 MG capsule Take 1 capsule (40 mg total) by mouth daily. 11/01/15   Colon Branch, MD  pravastatin (PRAVACHOL) 40 MG tablet Take 2 tablets (80 mg total) by mouth daily. 07/03/16   Colon Branch, MD  sitaGLIPtin (JANUVIA) 100 MG tablet Take 1 tablet (100 mg total) by mouth daily. 07/03/16   Colon Branch, MD  vitamin B-12 (CYANOCOBALAMIN) 1000 MCG tablet Take 1 tablet (1,000 mcg total) by mouth daily. 10/02/15   Colon Branch, MD  warfarin (COUMADIN) 2.5 MG tablet TAKE 1 TAB (2.5MG ) DAILY, PLEASE FOLLOW UP WITH DR Larose Kells FOR INR 10/14/16   Colon Branch, MD    Family History Family History  Problem Relation Age of Onset  . Alzheimer's disease Mother   . Heart failure Father   . CVA Father   . Coronary artery disease Brother  cabg  . Alzheimer's disease Sister   . Mental illness Sister   . Alzheimer's disease Sister   . Other Sister        lung problems  . Diabetes Other        several siblings  . Prostate cancer Neg Hx   . Colon cancer Neg Hx   . Esophageal cancer Neg Hx   . Stomach cancer Neg Hx   . Rectal cancer Neg Hx     Social History Social History  Substance Use Topics  . Smoking status: Never Smoker  . Smokeless tobacco: Never Used  . Alcohol use 0.6 - 1.2 oz/week    1 - 2 Glasses of wine per week     Comment: socially      Allergies   Ace inhibitors; Codeine phosphate; and Hydrochlorothiazide w-triamterene   Review of Systems Review of Systems  Neurological: Negative for tingling and numbness.  All other systems reviewed and are negative.    Physical Exam Updated Vital Signs BP 127/62   Pulse 69   Temp (!) 97.5 F (36.4 C) (Oral)   Resp (!) 23   Ht 5\' 7"  (1.702 m)   Wt 70.3 kg (155 lb)   SpO2 97%   BMI 24.28 kg/m   Physical Exam  Constitutional: He is oriented to person, place, and time. He appears well-developed and well-nourished. No distress.  HENT:  Head: Normocephalic and atraumatic.  Mouth/Throat: Oropharynx  is clear and moist.  Neck: Normal range of motion. Neck supple.  Cardiovascular: Normal rate and regular rhythm.  Exam reveals no friction rub.   No murmur heard. Pulmonary/Chest: Effort normal and breath sounds normal. No respiratory distress. He has no wheezes. He has no rales.  Abdominal: Soft. Bowel sounds are normal. He exhibits no distension. There is no tenderness.  Musculoskeletal: He exhibits no edema.  There is ecchymosis and mild swelling to the left anterior shoulder. This area is tender to palpation. He has pain with range of motion. Ulnar and radial pulses are palpable. He is able to flex, extend, and oppose all fingers and sensation is intact throughout the entire hand.  Neurological: He is alert and oriented to person, place, and time. Coordination normal.  Skin: Skin is warm and dry. He is not diaphoretic.  Nursing note and vitals reviewed.    ED Treatments / Results  Labs (all labs ordered are listed, but only abnormal results are displayed) Labs Reviewed  CBC - Abnormal; Notable for the following:       Result Value   WBC 12.5 (*)    All other components within normal limits  BASIC METABOLIC PANEL  I-STAT TROPONIN, ED    EKG  EKG Interpretation  Date/Time:  Friday November 29 2016 03:42:25 EDT Ventricular Rate:  67 PR Interval:    QRS Duration: 150 QT Interval:  444 QTC Calculation: 469 R Axis:   76 Text Interpretation:  VENTRICULAR PACED RHYTHM Confirmed by Veryl Speak [phone]) on 11/29/2016 4:07:59 AM       Radiology No results found.  Procedures Procedures (including critical care time)  Medications Ordered in ED Medications  morphine 4 MG/ML injection 4 mg (not administered)  ondansetron (ZOFRAN) injection 4 mg (not administered)     Initial Impression / Assessment and Plan / ED Course  I have reviewed the triage vital signs and the nursing notes.  Pertinent labs & imaging results that were available during my care of the patient were  reviewed by me and considered  in my medical decision making (see chart for details).  Patient presents here with complaints of severe left shoulder pain. This began yesterday in the absence of any specific injury or trauma. He did fall in March and injured the same shoulder, however denies doing anything recently to explain this pain. He does have a small bruise overlying the anterior aspect of the shoulder, however it otherwise appears grossly unremarkable. There are intact ulnar and radial pulses and motor and sensation to the hand is intact.  His symptoms are clearly musculoskeletal in nature and not cardiac. His EKG shows a paced rhythm and troponin is 0. He was given pain medicine and his shoulder seems to be feeling better. He will be discharged, to follow-up with his orthopedist. He will be prescribed prescribed pain medicine due to the degree of his discomfort.  Final Clinical Impressions(s) / ED Diagnoses   Final diagnoses:  None    New Prescriptions New Prescriptions   No medications on file     Veryl Speak, MD 11/29/16 [id number]    Veryl Speak, MD 11/29/16 [phone]

## 2016-11-29 NOTE — Telephone Encounter (Signed)
LMOM informing Pt to return call.  

## 2016-11-29 NOTE — Discharge Instructions (Signed)
Percocet as prescribed as needed for pain.  Wear your arm sling for comfort.  Call your orthopedist to arrange an appointment in the next 3-4 days, and return to the ER if symptoms significantly worsen or change.

## 2016-12-02 ENCOUNTER — Other Ambulatory Visit: Payer: Self-pay | Admitting: Internal Medicine

## 2016-12-04 NOTE — Progress Notes (Signed)
Subjective:   [name] is a 83 y.o. male who presents for Medicare Annual/Subsequent preventive examination. Accompanied by daughter [name].  Review of Systems:  No ROS.  Medicare Wellness Visit. Additional risk factors are reflected in the social history. Cardiac Risk Factors include: advanced age (>87men, >71 women);diabetes mellitus;dyslipidemia;hypertension;male gender;sedentary lifestyle Sleep patterns:   Sleeps intermittently. Feels like he gets the rest he needs. Home Safety/Smoke Alarms: Feels safe in home. Smoke alarms in place.  Living environment; residence and Firearm Safety: 3 story home. Lives on 1 floor. Lives alone.  Seat Belt Safety/Bike Helmet: Wears seat belt.   Counseling:   Eye Exam- hx of cataract sx. Readers as needed. Dr.Tanner yearly Dental- Dr.Beshers yearly.   Male:   CCS-   No longer doing routine screening due to age.  PSA-  Lab Results  Component Value Date   PSA 2.59 02/28/2014   PSA 2.20 05/23/2010   PSA 2.70 06/07/2009       Objective:    Vitals: BP 135/66 (BP Location: Right Arm, Patient Position: Sitting, Cuff Size: Normal)   Pulse 63   Ht 5\' 7"  (1.702 m)   Wt 156 lb 3.2 oz (70.9 kg)   SpO2 98%   BMI 24.46 kg/m   Body mass index is 24.46 kg/m.  Tobacco History  Smoking Status  . Never Smoker  Smokeless Tobacco  . Never Used     Counseling given: Not Answered   Past Medical History:  Diagnosis Date  . AAA (abdominal aortic aneurysm) ([place])   . Anemia due to chronic blood loss 11/25/2008   Recurrent over the years EGD and colonoscopy x 2 each 2004 and 2010 without cause    . Atrial fibrillation ([place])   . BPH (benign prostatic hyperplasia)    reports aprocedure (TURP) remotely in [place].Marland KitchenNo futher f/u w/ urology  . CAD (coronary artery disease)   . Diabetes mellitus, type 2 ([place])   . Diverticulosis    left colon  . Erosive gastritis   . Fall 08/08/2016   OUTSIDE AT HOME FACIAL TRAUMA   . GERD (gastroesophageal reflux  disease)   . Hyperlipidemia   . Hypertension   . Hypothyroidism   . Insomnia    transient  . Osteopenia    dexa 2-11  . Vitamin B12 deficiency    Past Surgical History:  Procedure Laterality Date  . CARDIAC CATHETERIZATION  01/31/06, 09/25/10, 09-2011  . CATARACT EXTRACTION     right  . CHOLECYSTECTOMY, LAPAROSCOPIC    . COLONOSCOPY     multiple  . CORONARY ARTERY BYPASS GRAFT     1999 stents in 2000  . ESOPHAGOGASTRODUODENOSCOPY     multiple  . GIVENS CAPSULE STUDY N/A 11/04/2012   Procedure: GIVENS CAPSULE STUDY;  Surgeon: Gatha Mayer, MD;  Location: [facility] ENDOSCOPY;  Service: Endoscopy;  Laterality: N/A;  . inguinal herniorrhaphies     bilateral  . LEFT HEART CATHETERIZATION WITH CORONARY ANGIOGRAM N/A 10/04/2011   Procedure: LEFT HEART CATHETERIZATION WITH CORONARY ANGIOGRAM;  Surgeon: Burnell Blanks, MD;  Location: [facility] CATH LAB;  Service: Cardiovascular;  Laterality: N/A;  . pacemaker  [place], 2013   medtronic minix [id number]  . PENILE PROSTHESIS IMPLANT  1992  . PERMANENT PACEMAKER GENERATOR CHANGE N/A 06/05/2011   Procedure: PERMANENT PACEMAKER GENERATOR CHANGE;  Surgeon: Evans Lance, MD;  Location: [facility] CATH LAB;  Service: Cardiovascular;  Laterality: N/A;  . PROSTATECTOMY     transurethral  . right hip replacement    .  TRANSTHORACIC ECHOCARDIOGRAM  12/2006   Family History  Problem Relation Age of Onset  . Alzheimer's disease Mother   . Heart failure Father   . CVA Father   . Coronary artery disease Brother        cabg  . Alzheimer's disease Sister   . Mental illness Sister   . Alzheimer's disease Sister   . Other Sister        lung problems  . Diabetes Other        several siblings  . Prostate cancer Neg Hx   . Colon cancer Neg Hx   . Esophageal cancer Neg Hx   . Stomach cancer Neg Hx   . Rectal cancer Neg Hx    History  Sexual Activity  . Sexual activity: No    Outpatient Encounter Prescriptions as of 12/09/2016  Medication Sig  .  acetaminophen (TYLENOL) 325 MG tablet Take 650 mg by mouth every 6 (six) hours as needed for mild pain or moderate pain.   [name] digoxin (LANOXIN) 0.125 MG tablet Take 0.5 tablets (0.0625 mg total) by mouth daily.  . fenofibrate micronized (LOFIBRA) 134 MG capsule Take 1 capsule (134 mg total) by mouth daily.  . insulin glargine (LANTUS) 100 UNIT/ML injection Inject 0.38 mLs (38 Units total) into the skin at bedtime.  . Insulin Pen Needle 31G X 6 MM MISC To use w/ Lantus  . latanoprost (XALATAN) 0.005 % ophthalmic solution Place 1 drop into both eyes at bedtime.  [name] levothyroxine (SYNTHROID, LEVOTHROID) 88 MCG tablet Take 1 tablet (88 mcg total) by mouth daily before breakfast.  . metoprolol tartrate (LOPRESSOR) 25 MG tablet Take 25 mg by mouth 2 (two) times daily.   . mirtazapine (REMERON) 15 MG tablet Take 1 tablet (15 mg total) by mouth at bedtime.  . nitroGLYCERIN (NITROSTAT) 0.4 MG SL tablet Place 0.4 mg under the tongue every 5 (five) minutes as needed for chest pain. Reported on 09/27/2015  . omeprazole (PRILOSEC) 40 MG capsule Take 1 capsule (40 mg total) by mouth daily.  [name] oxyCODONE-acetaminophen (PERCOCET) 5-325 MG tablet Take 1-2 tablets by mouth every 6 (six) hours as needed.  . pravastatin (PRAVACHOL) 40 MG tablet Take 2 tablets (80 mg total) by mouth daily.  . sitaGLIPtin (JANUVIA) 100 MG tablet Take 1 tablet (100 mg total) by mouth daily.  . vitamin B-12 (CYANOCOBALAMIN) 1000 MCG tablet Take 1 tablet (1,000 mcg total) by mouth daily.  [name] warfarin (COUMADIN) 2.5 MG tablet TAKE 1 TAB (2.5MG ) DAILY, PLEASE FOLLOW UP WITH DR PAZ FOR INR  . amoxicillin (AMOXIL) 500 MG capsule Take 2,000 mg by mouth See admin instructions. ONE HOUR PRIOR TO [facility] DENTAL APPOINTMENT   No facility-administered encounter medications on file as of 12/09/2016.     Activities of Daily Living In your present state of health, do you have any difficulty performing the following activities: 12/09/2016 08/08/2016  Hearing?  Y N  Comment pt states he has hearing aids -  Vision? N N  Difficulty concentrating or making decisions? N N  Walking or climbing stairs? Y N  Dressing or bathing? N N  Doing errands, shopping? Y N  Comment Daughter drove pt.  -  Preparing Food and eating ? N -  Using the Toilet? N -  In the past six months, have you accidently leaked urine? N -  Do you have problems with loss of bowel control? N -  Managing your Medications? N -  Managing your Finances? N -  Housekeeping or managing your Housekeeping? N -  Some recent data might be hidden    Patient Care Team: Colon Branch, MD as PCP - Arvil Persons, Vernelle Emerald, MD as Consulting Physician (Internal Medicine) Ditty, Kevan Ny, MD as Consulting Physician (Neurosurgery)   Assessment:    Physical assessment deferred to PCP.  Exercise Activities and Dietary recommendations Current Exercise Habits: The patient does not participate in regular exercise at present, Exercise limited by: None identified Diet (meal preparation, eat out, water intake, caffeinated beverages, dairy products, fruits and vegetables): Pt states he eats what he wants when he wants. It varies.   Goals      Patient Stated   . Maintain independence. (pt-stated)      Fall Risk Fall Risk  12/09/2016 09/27/2015 02/28/2014 07/27/2013 07/14/2012  Falls in the past year? Yes Yes Yes No No  Number falls in past yr: 1 1 1  - -  Injury with Fall? No No No - -  Risk for fall due to : Impaired balance/gait - Impaired balance/gait - -  Follow up Education provided;Falls prevention discussed Falls evaluation completed - - -   Depression Screen PHQ 2/9 Scores 12/09/2016 09/27/2015 02/28/2014 07/27/2013  PHQ - 2 Score 0 0 0 0    Cognitive Function MMSE - Mini Mental State Exam 12/09/2016  Not completed: Refused        Immunization History  Administered Date(s) Administered  . H1N1 06/14/2008  . Influenza Split 03/01/2011  . Influenza Whole 03/31/2007, 02/04/2008,  02/03/2009, 04/13/2010  . Influenza, High Dose Seasonal PF 03/15/2013, 04/18/2016  . Influenza,inj,Quad PF,36+ Mos 02/28/2014  . PPD Test 08/13/2016  . Pneumococcal Conjugate-13 03/02/2014  . Pneumococcal Polysaccharide-23 03/31/2007  . Td 12/15/2007   Screening Tests Health Maintenance  Topic Date Due  . OPHTHALMOLOGY EXAM  03/06/1936  . INFLUENZA VACCINE  12/11/2016  . FOOT EXAM  12/27/2016  . URINE MICROALBUMIN  12/27/2016  . HEMOGLOBIN A1C  02/09/2017  . TETANUS/TDAP  12/14/2017  . PNA vac Low Risk Adult  Completed      Plan:   Follow up with PCP today as scheduled  Continue to eat heart healthy diet (full of fruits, vegetables, whole grains, lean protein, water--limit salt, fat, and sugar intake) and increase physical activity as tolerated.  Continue doing brain stimulating activities (puzzles, reading, adult coloring books, staying active) to keep memory sharp.    I have personally reviewed and noted the following in the patient's chart:   . Medical and social history . Use of alcohol, tobacco or illicit drugs  . Current medications and supplements . Functional ability and status . Nutritional status . Physical activity . Advanced directives . List of other physicians . Hospitalizations, surgeries, and ER visits in previous 12 months . Vitals . Screenings to include cognitive, depression, and falls . Referrals and appointments  In addition, I have reviewed and discussed with patient certain preventive protocols, quality metrics, and best practice recommendations. A written personalized care plan for preventive services as well as general preventive health recommendations were provided to patient.     Naaman Plummer [place], [place]  12/09/2016  Kathlene November, MD

## 2016-12-09 ENCOUNTER — Encounter: Payer: Self-pay | Admitting: Internal Medicine

## 2016-12-09 ENCOUNTER — Ambulatory Visit (INDEPENDENT_AMBULATORY_CARE_PROVIDER_SITE_OTHER): Payer: Medicare HMO | Admitting: Internal Medicine

## 2016-12-09 VITALS — BP 135/66 | HR 63 | Ht 67.0 in | Wt 156.2 lb

## 2016-12-09 DIAGNOSIS — E039 Hypothyroidism, unspecified: Secondary | ICD-10-CM | POA: Diagnosis not present

## 2016-12-09 DIAGNOSIS — I10 Essential (primary) hypertension: Secondary | ICD-10-CM | POA: Diagnosis not present

## 2016-12-09 DIAGNOSIS — E118 Type 2 diabetes mellitus with unspecified complications: Secondary | ICD-10-CM

## 2016-12-09 DIAGNOSIS — I4891 Unspecified atrial fibrillation: Secondary | ICD-10-CM | POA: Diagnosis not present

## 2016-12-09 DIAGNOSIS — E785 Hyperlipidemia, unspecified: Secondary | ICD-10-CM

## 2016-12-09 DIAGNOSIS — Z0000 Encounter for general adult medical examination without abnormal findings: Secondary | ICD-10-CM | POA: Diagnosis not present

## 2016-12-09 DIAGNOSIS — N189 Chronic kidney disease, unspecified: Secondary | ICD-10-CM

## 2016-12-09 DIAGNOSIS — I4821 Permanent atrial fibrillation: Secondary | ICD-10-CM

## 2016-12-09 LAB — POCT INR: INR: 5.1

## 2016-12-09 MED ORDER — NITROGLYCERIN 0.4 MG SL SUBL: 0.4000 mg | SUBLINGUAL_TABLET | SUBLINGUAL | 3 refills | Status: AC | PRN

## 2016-12-09 NOTE — Patient Instructions (Addendum)
GO TO THE LAB : Get the blood work     GO TO THE FRONT DESK Schedule an appointment for a  Coumadin check on 11/11/2016 Schedule your next appointment to see me in 3 months  Hold Coumadin   Take medications per the list below  Mr. Michaelsen , Thank you for taking time to come for your Medicare Wellness Visit. I appreciate your ongoing commitment to your health goals. Please review the following plan we discussed and let me know if I can assist you in the future.   These are the goals we discussed: Goals      Patient Stated   . Maintain independence. (pt-stated)       This is a list of the screening recommended for you and due dates:  Health Maintenance  Topic Date Due  . Eye exam for diabetics  03/06/1936  . Flu Shot  12/11/2016  . Complete foot exam   12/27/2016  . Urine Protein Check  12/27/2016  . Hemoglobin A1C  02/09/2017  . Tetanus Vaccine  12/14/2017  . Pneumonia vaccines  Completed   Continue to eat heart healthy diet (full of fruits, vegetables, whole grains, lean protein, water--limit salt, fat, and sugar intake) and increase physical activity as tolerated.  Continue doing brain stimulating activities (puzzles, reading, adult coloring books, staying active) to keep memory sharp.    Health Maintenance, Male A healthy lifestyle and preventive care is important for your health and wellness. Ask your health care provider about what schedule of regular examinations is right for you. What should I know about weight and diet? Eat a Healthy Diet  Eat plenty of vegetables, fruits, whole grains, low-fat dairy products, and lean protein.  Do not eat a lot of foods high in solid fats, added sugars, or salt.  Maintain a Healthy Weight Regular exercise can help you achieve or maintain a healthy weight. You should:  Do at least 150 minutes of exercise each week. The exercise should increase your heart rate and make you sweat (moderate-intensity exercise).  Do  strength-training exercises at least twice a week.  Watch Your Levels of Cholesterol and Blood Lipids  Have your blood tested for lipids and cholesterol every 5 years starting at 81 years of age. If you are at high risk for heart disease, you should start having your blood tested when you are 81 years old. You may need to have your cholesterol levels checked more often if: ? Your lipid or cholesterol levels are high. ? You are older than 81 years of age. ? You are at high risk for heart disease.  What should I know about cancer screening? Many types of cancers can be detected early and may often be prevented. Lung Cancer  You should be screened every year for lung cancer if: ? You are a current smoker who has smoked for at least 30 years. ? You are a former smoker who has quit within the past 15 years.  Talk to your health care provider about your screening options, when you should start screening, and how often you should be screened.  Colorectal Cancer  Routine colorectal cancer screening usually begins at 81 years of age and should be repeated every 5-10 years until you are 81 years old. You may need to be screened more often if early forms of precancerous polyps or small growths are found. Your health care provider may recommend screening at an earlier age if you have risk factors for colon cancer.  Your  health care provider may recommend using home test kits to check for hidden blood in the stool.  A small camera at the end of a tube can be used to examine your colon (sigmoidoscopy or colonoscopy). This checks for the earliest forms of colorectal cancer.  Prostate and Testicular Cancer  Depending on your age and overall health, your health care provider may do certain tests to screen for prostate and testicular cancer.  Talk to your health care provider about any symptoms or concerns you have about testicular or prostate cancer.  Skin Cancer  Check your skin from head to toe  regularly.  Tell your health care provider about any new moles or changes in moles, especially if: ? There is a change in a mole's size, shape, or color. ? You have a mole that is larger than a pencil eraser.  Always use sunscreen. Apply sunscreen liberally and repeat throughout the day.  Protect yourself by wearing long sleeves, pants, a wide-brimmed hat, and sunglasses when outside.  What should I know about heart disease, diabetes, and high blood pressure?  If you are 35-87 years of age, have your blood pressure checked every 3-5 years. If you are 50 years of age or older, have your blood pressure checked every year. You should have your blood pressure measured twice-once when you are at a hospital or clinic, and once when you are not at a hospital or clinic. Record the average of the two measurements. To check your blood pressure when you are not at a hospital or clinic, you can use: ? An automated blood pressure machine at a pharmacy. ? A home blood pressure monitor.  Talk to your health care provider about your target blood pressure.  If you are between 81-26 years old, ask your health care provider if you should take aspirin to prevent heart disease.  Have regular diabetes screenings by checking your fasting blood sugar level. ? If you are at a normal weight and have a low risk for diabetes, have this test once every three years after the age of 13. ? If you are overweight and have a high risk for diabetes, consider being tested at a younger age or more often.  A one-time screening for abdominal aortic aneurysm (AAA) by ultrasound is recommended for men aged 40-75 years who are current or former smokers. What should I know about preventing infection? Hepatitis B If you have a higher risk for hepatitis B, you should be screened for this virus. Talk with your health care provider to find out if you are at risk for hepatitis B infection. Hepatitis C Blood testing is recommended  for:  Everyone born from [birth year] through 1963.  Anyone with known risk factors for hepatitis C.  Sexually Transmitted Diseases (STDs)  You should be screened each year for STDs including gonorrhea and chlamydia if: ? You are sexually active and are younger than 81 years of age. ? You are older than 81 years of age and your health care provider tells you that you are at risk for this type of infection. ? Your sexual activity has changed since you were last screened and you are at an increased risk for chlamydia or gonorrhea. Ask your health care provider if you are at risk.  Talk with your health care provider about whether you are at high risk of being infected with HIV. Your health care provider may recommend a prescription medicine to help prevent HIV infection.  What else can I do?  Schedule regular health, dental, and eye exams.  Stay current with your vaccines (immunizations).  Do not use any tobacco products, such as cigarettes, chewing tobacco, and e-cigarettes. If you need help quitting, ask your health care provider.  Limit alcohol intake to no more than 2 drinks per day. One drink equals 12 ounces of beer, 5 ounces of wine, or 1 ounces of hard liquor.  Do not use street drugs.  Do not share needles.  Ask your health care provider for help if you need support or information about quitting drugs.  Tell your health care provider if you often feel depressed.  Tell your health care provider if you have ever been abused or do not feel safe at home. This information is not intended to replace advice given to you by your health care provider. Make sure you discuss any questions you have with your health care provider. Document Released: 10/26/2007 Document Revised: 12/27/2015 Document Reviewed: 01/31/2015 Elsevier Interactive Patient Education  [employer].

## 2016-12-09 NOTE — Progress Notes (Signed)
Subjective:    Patient ID: [name], male    DOB: Aug 18, 1925, 83 y.o.   MRN: [id number]  DOS:  12/09/2016 Type of visit - description : rov, here w/ his daughter [name] Interval history: Developed left shoulder pain spontaneously 11/28/2016, shortly after he saw a hematoma. Because of that he went to the ER 11/29/2016  . Vital signs were stable  Labs: BMP showed creatinine of 1.3, slightly elevated, potassium 5.0. Troponin negative. Hemoglobin 13.5. Platelets 183. Chest x-ray: See report. Shoulder-humerus  : no FX Since then, the pain has decrease, was Rx OxyContin but did not use it.  CAD: Reports good compliance of medication, needs a refill on nitroglycerin DM: Take insulin "some days" and  Januvia. Coumadin: Reports takes medications as prescribed, INR 5.1 today. Compliance,states takes  medications " the way you are prescribing them" but could not mentioned the name of the medicines.   Review of Systems Denies chest pain or difficulty breathing. No blood in the stools. No dysuria or gross hematuria. No headaches.   Past Medical History:  Diagnosis Date  . AAA (abdominal aortic aneurysm) ([place])   . Anemia due to chronic blood loss 11/25/2008   Recurrent over the years EGD and colonoscopy x 2 each 2004 and 2010 without cause    . Atrial fibrillation ([place])   . BPH (benign prostatic hyperplasia)    reports aprocedure (TURP) remotely in [place] KitchenNo futher f/u w/ urology  . CAD (coronary artery disease)   . Diabetes mellitus, type 2 ([place])   . Diverticulosis    left colon  . Erosive gastritis   . Fall 08/08/2016   OUTSIDE AT HOME FACIAL TRAUMA   . GERD (gastroesophageal reflux disease)   . Hyperlipidemia   . Hypertension   . Hypothyroidism   . Insomnia    transient  . Osteopenia    dexa 2-11  . Vitamin B12 deficiency     Past Surgical History:  Procedure Laterality Date  . CARDIAC CATHETERIZATION  01/31/06, 09/25/10, 09-2011  . CATARACT EXTRACTION     right  .  CHOLECYSTECTOMY, LAPAROSCOPIC    . COLONOSCOPY     multiple  . CORONARY ARTERY BYPASS GRAFT     1999 stents in 2000  . ESOPHAGOGASTRODUODENOSCOPY     multiple  . GIVENS CAPSULE STUDY N/A 11/04/2012   Procedure: GIVENS CAPSULE STUDY;  Surgeon: Gatha Mayer, MD;  Location: [facility] ENDOSCOPY;  Service: Endoscopy;  Laterality: N/A;  . inguinal herniorrhaphies     bilateral  . LEFT HEART CATHETERIZATION WITH CORONARY ANGIOGRAM N/A 10/04/2011   Procedure: LEFT HEART CATHETERIZATION WITH CORONARY ANGIOGRAM;  Surgeon: Burnell Blanks, MD;  Location: [facility] CATH LAB;  Service: Cardiovascular;  Laterality: N/A;  . pacemaker  [place], 2013   medtronic minix 8341  . PENILE PROSTHESIS IMPLANT  1992  . PERMANENT PACEMAKER GENERATOR CHANGE N/A 06/05/2011   Procedure: PERMANENT PACEMAKER GENERATOR CHANGE;  Surgeon: Evans Lance, MD;  Location: [facility] CATH LAB;  Service: Cardiovascular;  Laterality: N/A;  . PROSTATECTOMY     transurethral  . right hip replacement    . TRANSTHORACIC ECHOCARDIOGRAM  12/2006    Social History   Social History  . Marital status: Legally Separated    Spouse name: N/A  . Number of children: 5  . Years of education: N/A   Occupational History  . retired Retired   Social History Main Topics  . Smoking status: Never Smoker  . Smokeless tobacco: Never Used  . Alcohol  use 0.6 - 1.2 oz/week    1 - 2 Glasses of wine per week     Comment: socially   . Drug use: No  . Sexual activity: No   Other Topics Concern  . Not on file   Social History Narrative   Lives by self, independent on ADL, retired. Separated from wife.    Daughters:   [name] ( lives in [place]) [phone]   [name] ( lives out of town)  [phone]            Allergies as of 12/09/2016      Reactions   Ace Inhibitors Cough   Codeine Phosphate Nausea Only   Hydrochlorothiazide W-triamterene Other (See Comments)   CRAMPING      Medication List       Accurate as of 12/09/16 11:59 PM.  Always use your most recent med list.          acetaminophen 325 MG tablet Commonly known as:  TYLENOL Take 650 mg by mouth every 6 (six) hours as needed for mild pain or moderate pain.   amoxicillin 500 MG capsule Commonly known as:  AMOXIL Take 2,000 mg by mouth See admin instructions. ONE HOUR PRIOR TO EACH DENTAL APPOINTMENT   digoxin 0.125 MG tablet Commonly known as:  LANOXIN Take 0.5 tablets (0.0625 mg total) by mouth daily.   fenofibrate micronized 134 MG capsule Commonly known as:  LOFIBRA Take 1 capsule (134 mg total) by mouth daily.   insulin glargine 100 UNIT/ML injection Commonly known as:  LANTUS Inject 0.38 mLs (38 Units total) into the skin at bedtime.   Insulin Pen Needle 31G X 6 MM Misc To use w/ Lantus   latanoprost 0.005 % ophthalmic solution Commonly known as:  XALATAN Place 1 drop into both eyes at bedtime.   levothyroxine 88 MCG tablet Commonly known as:  SYNTHROID, LEVOTHROID Take 1 tablet (88 mcg total) by mouth daily before breakfast.   metoprolol tartrate 25 MG tablet Commonly known as:  LOPRESSOR Take 25 mg by mouth 2 (two) times daily.   mirtazapine 15 MG tablet Commonly known as:  REMERON Take 1 tablet (15 mg total) by mouth at bedtime.   nitroGLYCERIN 0.4 MG SL tablet Commonly known as:  NITROSTAT Place 1 tablet (0.4 mg total) under the tongue every 5 (five) minutes x 3 doses as needed for chest pain. Then contact 911 or go to ER   omeprazole 40 MG capsule Commonly known as:  PRILOSEC Take 1 capsule (40 mg total) by mouth daily.   pravastatin 40 MG tablet Commonly known as:  PRAVACHOL Take 2 tablets (80 mg total) by mouth daily.   sitaGLIPtin 100 MG tablet Commonly known as:  JANUVIA Take 1 tablet (100 mg total) by mouth daily.   vitamin B-12 1000 MCG tablet Commonly known as:  CYANOCOBALAMIN Take 1 tablet (1,000 mcg total) by mouth daily.   warfarin 2.5 MG tablet Commonly known as:  COUMADIN TAKE 1 TAB (2.5MG ) DAILY,  PLEASE FOLLOW UP WITH DR Desha Bitner FOR INR          Objective:   Physical Exam BP 135/66 (BP Location: Right Arm, Patient Position: Sitting, Cuff Size: Normal)   Pulse 63   Ht 5\' 7"  (1.702 m)   Wt 156 lb 3.2 oz (70.9 kg)   SpO2 98%   BMI 24.46 kg/m  General:   Well developed, well nourished . NAD.  HEENT:  Normocephalic . Face symmetric, atraumatic Lungs:  CTA B  Normal respiratory effort, no intercostal retractions, no accessory muscle use. Heart: Seems regular,  no murmur.  No pretibial edema bilaterally  MSK: No TTP at the cervical or thoracic spine. Shoulders range of motion quite limited bilaterally, unable to elevate his arms much. Palpation of the arm/biceps: Symmetric. Skin: See picture Neurologic:  alert & oriented X3.  Speech normal, gait appropriate for age and unassisted Psych--  Cognition and judgment appear intact.  Cooperative with normal attention span and concentration.  Behavior appropriate. No anxious or depressed appearing.        Assessment & Plan:    Assessment DM, + neuropahty per exam 12-2015 CRI Creat ~ 1.3-1.4 HTN Hyperlipidemia Hypothyroidism CV: --CAD -- Dr Harrington Challenger --Atrial fibrillation, PPM --- Dr Lovena Le --AAA: [place] 06-2014 stable, was rec no further imagine  GI: Erosive gastritis, GERD, h/o anemia d/t  chronic blood loss BPH NEURO:  07-2016: Fall, vaso vagal syncope? : SAH, eld asa-warfarin Osteopenia- declined dexa 2015 B 12 deficiency  Plan: DM: Currently on insulin 38 units ("take sometimes") and Januvia. Goal of care is prevention of persisting hyperglycemia and no hypoglycemia. Check A1c CRI: Check a CMP HTN: On no medications at this point. Hyperlipidemia: Check a lipid panel, continue with Pravachol-fenofibrates. CAD: Asx, RF NTG Hypothyroidism: On Synthroid, check a TSH Anticoagulation: On Coumadin 2.5 mg qd, INR today 5.1, it has been a challenge to get him controlled lately. may benefit from oral anticoagulants. Plan: Send a  note to cardiology, okay for Xarelto, Eliquis? Hold coumadin, RTC INR check 3 days. Med compliance: Reports good med compliance (except insulin), he is very confident, his daughter agrees, seems very organized regards his meds.  RTC 3 months

## 2016-12-10 LAB — COMPREHENSIVE METABOLIC PANEL
ALBUMIN: 3.8 g/dL (ref 3.5–5.2)
ALT: 13 U/L (ref 0–53)
AST: 13 U/L (ref 0–37)
Alkaline Phosphatase: 42 U/L (ref 39–117)
BILIRUBIN TOTAL: 0.6 mg/dL (ref 0.2–1.2)
BUN: 20 mg/dL (ref 6–23)
CALCIUM: 8.9 mg/dL (ref 8.4–10.5)
CHLORIDE: 102 meq/L (ref 96–112)
CO2: 26 meq/L (ref 19–32)
Creatinine, Ser: 1.18 mg/dL (ref 0.40–1.50)
GFR: 61.81 mL/min (ref >60.00)
GLUCOSE: 362 mg/dL — AB (ref 70–99)
Potassium: 4.7 mEq/L (ref 3.5–5.1)
Sodium: 133 mEq/L — ABNORMAL LOW (ref 135–145)
TOTAL PROTEIN: 6.7 g/dL (ref 6.0–8.3)

## 2016-12-10 LAB — LIPID PANEL
CHOL/HDL RATIO: 6
Cholesterol: 145 mg/dL (ref 0–200)
HDL: 25.8 mg/dL — AB (ref >39.00)
NONHDL: 119.52
TRIGLYCERIDES: 341 mg/dL — AB (ref 0.0–149.0)
VLDL: 68.2 mg/dL — ABNORMAL HIGH (ref 0.0–40.0)

## 2016-12-10 LAB — LDL CHOLESTEROL, DIRECT: Direct LDL: 81 mg/dL

## 2016-12-10 LAB — TSH: TSH: 0.6 u[IU]/mL (ref 0.35–4.50)

## 2016-12-10 LAB — HEMOGLOBIN A1C: HEMOGLOBIN A1C: 10.5 % — AB (ref 4.6–6.5)

## 2016-12-10 NOTE — Assessment & Plan Note (Signed)
DM: Currently on insulin 38 units ("take sometimes") and Januvia. Goal of care is prevention of persisting hyperglycemia and no hypoglycemia. Check A1c CRI: Check a CMP HTN: On no medications at this point. Hyperlipidemia: Check a lipid panel, continue with Pravachol-fenofibrates. CAD: Asx, RF NTG Hypothyroidism: On Synthroid, check a TSH Anticoagulation: On Coumadin 2.5 mg qd, INR today 5.1, it has been a challenge to get him controlled lately. may benefit from oral anticoagulants. Plan: Send a note to cardiology, okay for Xarelto, Eliquis? Hold coumadin, RTC INR check 3 days. Med compliance: Reports good med compliance (except insulin), he is very confident, his daughter agrees, seems very organized regards his meds.  RTC 3 months

## 2016-12-12 ENCOUNTER — Telehealth: Payer: Self-pay | Admitting: Medical

## 2016-12-12 ENCOUNTER — Ambulatory Visit (INDEPENDENT_AMBULATORY_CARE_PROVIDER_SITE_OTHER): Payer: Medicare HMO | Admitting: Behavioral Health

## 2016-12-12 DIAGNOSIS — I4891 Unspecified atrial fibrillation: Secondary | ICD-10-CM | POA: Diagnosis not present

## 2016-12-12 LAB — POCT INR: INR: 1.6

## 2016-12-12 MED ORDER — APIXABAN 5 MG PO TABS: 5.0000 mg | ORAL_TABLET | Freq: Two times a day (BID) | ORAL | 3 refills | Status: DC

## 2016-12-12 MED ORDER — APIXABAN 5 MG PO TABS: 5.0000 mg | ORAL_TABLET | Freq: Two times a day (BID) | ORAL | 0 refills | Status: DC

## 2016-12-12 NOTE — Progress Notes (Addendum)
Pre visit review using our clinic review tool, if applicable. No additional management support is needed unless otherwise documented below in the visit note.  Patient came in office for INR recheck. He reported no changes or positive findings. Patient has held his Coumadin medication since this past Monday (12/09/16) as advised by PCP. Today's INR reading was 1.6.  Per [name]: STOP Coumadin. START Eliquis effective Saturday (12/14/16), take 5 mg twice daily. If cost of medication is too expensive, resume current regimen of taking Coumadin 2.5 mg daily & inform PCP on this upcoming Monday (12/16/16) or at your earliest convenience.  Patient was made aware of the provider's recommendations, in addition to the below results & instructions. He verbalized understanding and did not have any further questions or concerns before leaving the nurse visit.  Notes recorded by Colon Branch, MD on 12/12/2016 at 1:59 PM EDT Call patient: Diabetes could be better, A1c very high, recommend to continue with Januvia and take insulin every day unless he plans to skip a meal. Cholesterol is well-controlled Also, I discussed anticoagulation with cardiology, okay to stop Coumadin and switch to ELIQUIS 5 mg twice a day. Send a prescription. 60 and 3 refills. If unable to afford needs to let us know. He needed to check an INR today. Done?  Reviewed above note. Appears [name] already aware. But will ask RN to notify [name] of his inr value.  [place], Percell Miller, PA-C

## 2016-12-12 NOTE — Telephone Encounter (Signed)
I reviewed your note and [name] note. But it looks like Dr Larose Kells wanted to know his inr value yesterday. Did he review that. If not would you run that value by him to see if his standard orders he gave pt would change?

## 2016-12-12 NOTE — Patient Instructions (Signed)
Per [name]: STOP Coumadin. START Eliquis effective Saturday (12/14/16), take 5 mg twice daily. If cost of medication is too expensive, resume current regimen of taking Coumadin 2.5 mg daily & inform PCP on this upcoming Monday (12/16/16) or at your earliest convenience.

## 2016-12-13 NOTE — Telephone Encounter (Signed)
INR value was 1.6 on 12/12/16. PCP was made aware.

## 2016-12-16 ENCOUNTER — Ambulatory Visit (INDEPENDENT_AMBULATORY_CARE_PROVIDER_SITE_OTHER): Payer: Medicare HMO | Admitting: *Deleted

## 2016-12-16 DIAGNOSIS — R55 Syncope and collapse: Secondary | ICD-10-CM

## 2016-12-16 DIAGNOSIS — I482 Chronic atrial fibrillation: Secondary | ICD-10-CM

## 2016-12-16 DIAGNOSIS — Z950 Presence of cardiac pacemaker: Secondary | ICD-10-CM | POA: Diagnosis not present

## 2016-12-16 DIAGNOSIS — I4821 Permanent atrial fibrillation: Secondary | ICD-10-CM

## 2016-12-16 LAB — CUP PACEART INCLINIC DEVICE CHECK
Battery Impedance: 2668 Ohm
Battery Remaining Longevity: 27 mo
Battery Voltage: 2.73 V
Brady Statistic RV Percent Paced: 77 %
Date Time Interrogation Session: 20180806151447
Implantable Lead Implant Date: 19950413
Implantable Lead Location: 753860
Implantable Pulse Generator Implant Date: 20130123
Lead Channel Impedance Value: 554 Ohm
Lead Channel Pacing Threshold Amplitude: 0.75 V
Lead Channel Pacing Threshold Pulse Width: 0.4 ms
Lead Channel Setting Pacing Pulse Width: 0.4 ms
Lead Channel Setting Sensing Sensitivity: 4 mV
MDC IDC MSMT LEADCHNL RA IMPEDANCE VALUE: 0 Ohm
MDC IDC MSMT LEADCHNL RV SENSING INTR AMPL: 11.2 mV
MDC IDC SET LEADCHNL RV PACING AMPLITUDE: 2.5 V

## 2016-12-16 NOTE — Progress Notes (Signed)
Pacemaker check in clinic. Normal device function. Threshold, sensing, impedance consistent with previous measurements. Chronic AF, on Eliquis. Device programmed to maximize longevity. No ventricular rates noted. Device programmed at appropriate safety margins. Histogram distribution appropriate for patient activity level. Device programmed to optimize intrinsic conduction. Estimated longevity 1-3.5 years. ROV with Dr. Lovena Le in February 2019.

## 2016-12-17 ENCOUNTER — Telehealth: Payer: Self-pay | Admitting: Internal Medicine

## 2016-12-17 NOTE — Telephone Encounter (Signed)
Please check on patient, last week we decided to stop Coumadin and start Eliquis, has he been able to afford it? Good compliance? Let me know

## 2016-12-18 NOTE — Telephone Encounter (Signed)
Thank you :)

## 2016-12-18 NOTE — Telephone Encounter (Signed)
Spoke w/ Pt, he informed that he had no issues affording Eliquis and has been taking twice daily as prescribed, has not experienced any issues such as bleeding.

## 2016-12-20 DIAGNOSIS — L570 Actinic keratosis: Secondary | ICD-10-CM | POA: Diagnosis not present

## 2016-12-24 ENCOUNTER — Telehealth: Payer: Self-pay | Admitting: Internal Medicine

## 2016-12-24 NOTE — Telephone Encounter (Signed)
Mr. [employer] can hold Apixaban for 2 days prior to his procedure and resume post op once the surgeon feels the bleeding risk is low enough. Thank you, Richardson Dopp, PA-C    12/24/2016 9:32 PM

## 2016-12-24 NOTE — Telephone Encounter (Signed)
Please advise 

## 2016-12-24 NOTE — Telephone Encounter (Signed)
Caller name: Cottrell Gentles Relationship to patient: self Can be reached: [phone]  Reason for call: Pt has oral surgery scheduled for August 20. They asked pt to stop taking Eliquis 2 days prior to surgery. Please call to advise pt.

## 2016-12-24 NOTE — Telephone Encounter (Signed)
Seems appropriate but will ask for cardiology opinion. Forward to [employer]

## 2016-12-25 MED ORDER — AMOXICILLIN 500 MG PO CAPS: 2000.0000 mg | ORAL_CAPSULE | ORAL | 0 refills | Status: DC

## 2016-12-25 NOTE — Telephone Encounter (Signed)
Informed Pt of recommendations per Cardiology. Pt informed he is also needing Amoxicillin sent- having 2 teeth pulled. Please advise.

## 2016-12-25 NOTE — Telephone Encounter (Signed)
Send  #10 tablet

## 2016-12-25 NOTE — Telephone Encounter (Signed)
Thank you. Please notify the patient

## 2016-12-25 NOTE — Telephone Encounter (Signed)
Rx sent 

## 2017-01-06 ENCOUNTER — Telehealth: Payer: Self-pay | Admitting: Internal Medicine

## 2017-01-06 NOTE — Telephone Encounter (Signed)
Pt called in to request a call back    CB: [phone]

## 2017-01-06 NOTE — Telephone Encounter (Signed)
LMOM informing Pt to return call.  

## 2017-01-20 ENCOUNTER — Ambulatory Visit (INDEPENDENT_AMBULATORY_CARE_PROVIDER_SITE_OTHER): Payer: Medicare HMO | Admitting: Internal Medicine

## 2017-01-20 ENCOUNTER — Encounter: Payer: Self-pay | Admitting: Internal Medicine

## 2017-01-20 VITALS — BP 118/64 | HR 77 | Ht 67.0 in | Wt 152.4 lb

## 2017-01-20 DIAGNOSIS — R55 Syncope and collapse: Secondary | ICD-10-CM | POA: Diagnosis not present

## 2017-01-20 DIAGNOSIS — I2510 Atherosclerotic heart disease of native coronary artery without angina pectoris: Secondary | ICD-10-CM

## 2017-01-20 DIAGNOSIS — I482 Chronic atrial fibrillation: Secondary | ICD-10-CM | POA: Diagnosis not present

## 2017-01-20 DIAGNOSIS — R2681 Unsteadiness on feet: Secondary | ICD-10-CM | POA: Diagnosis not present

## 2017-01-20 DIAGNOSIS — I4821 Permanent atrial fibrillation: Secondary | ICD-10-CM

## 2017-01-20 DIAGNOSIS — I10 Essential (primary) hypertension: Secondary | ICD-10-CM | POA: Diagnosis not present

## 2017-01-20 NOTE — Patient Instructions (Signed)
Your physician recommends that you continue on your current medications as directed. Please refer to the Current Medication list given to you today.  You have been referred to balance therapy for unsteadiness.

## 2017-01-20 NOTE — Progress Notes (Signed)
Cardiology Office Note   Date:  01/20/2017   ID:  [name], DOB 07/25/1925, MRN [id number]  PCP:  Colon Branch, MD  Cardiologist:   Dorris Carnes, MD   Pt presents for eval of dizzy spell F/U of CAD and dizziness   History of Present Illness: [name] is a 83 y.o. male with a history ofCAD, AAA, atrial fib He is also followed byG Lovena Le for PPM I saw hinm in 2016  He has been seen by several APPs since  He was admitted in March with syncopal spell (?)  Cuase not found  Had [facility]   Now back on anticoagulation   (Approve by Neurosurg)  He was seen by Kathleen Argue in May Since theh he has not had any further episdoes of syncope  He has fallen a few times  He says he is unsteady on his feet   He says PT after hosp d/c did not help He denies CP  Breathing is fair Seen in derm  Precancerous lesion was treated with cream  Now it is red and painful  Appt at endo of occtober Also has problems with shoulders  Cannot lift arms high  Current Outpatient Prescriptions  Medication Sig Dispense Refill  . acetaminophen (TYLENOL) 325 MG tablet Take 650 mg by mouth every 6 (six) hours as needed for mild pain or moderate pain.     [name] amoxicillin (AMOXIL) 500 MG capsule Take 4 capsules (2,000 mg total) by mouth See admin instructions. ONE HOUR PRIOR TO EACH DENTAL APPOINTMENT 10 capsule 0  . apixaban (ELIQUIS) 5 MG TABS tablet Take 1 tablet (5 mg total) by mouth 2 (two) times daily. 180 tablet 0  . digoxin (LANOXIN) 0.125 MG tablet Take 0.5 tablets (0.0625 mg total) by mouth daily. 45 tablet 3  . fenofibrate micronized (LOFIBRA) 134 MG capsule Take 1 capsule (134 mg total) by mouth daily. 90 capsule 2  . insulin glargine (LANTUS) 100 UNIT/ML injection Inject 38 Units into the skin at bedtime. Pt reports taking every 2-3 days    . Insulin Pen Needle 31G X 6 MM MISC To use w/ Lantus 100 each 12  . latanoprost (XALATAN) 0.005 % ophthalmic solution Place 1 drop into both eyes at bedtime.    [name] levothyroxine  (SYNTHROID, LEVOTHROID) 88 MCG tablet Take 1 tablet (88 mcg total) by mouth daily before breakfast. 90 tablet 1  . metoprolol tartrate (LOPRESSOR) 25 MG tablet Take 25 mg by mouth 2 (two) times daily.     . mirtazapine (REMERON) 15 MG tablet Take 1 tablet (15 mg total) by mouth at bedtime. 90 tablet 1  . nitroGLYCERIN (NITROSTAT) 0.4 MG SL tablet Place 1 tablet (0.4 mg total) under the tongue every 5 (five) minutes x 3 doses as needed for chest pain. Then contact 911 or go to ER 25 tablet 3  . omeprazole (PRILOSEC) 40 MG capsule Take 1 capsule (40 mg total) by mouth daily. 90 capsule 0  . pravastatin (PRAVACHOL) 40 MG tablet Take 2 tablets (80 mg total) by mouth daily. 180 tablet 1  . sitaGLIPtin (JANUVIA) 100 MG tablet Take 1 tablet (100 mg total) by mouth daily. 90 tablet 1  . vitamin B-12 (CYANOCOBALAMIN) 1000 MCG tablet Take 1 tablet (1,000 mcg total) by mouth daily. 90 tablet 2  . triamcinolone ointment (KENALOG) 0.1 %      No current facility-administered medications for this visit.     Allergies:   Ace inhibitors; Codeine phosphate;  and Hydrochlorothiazide w-triamterene   Past Medical History:  Diagnosis Date  . AAA (abdominal aortic aneurysm) ([place])   . Anemia due to chronic blood loss 11/25/2008   Recurrent over the years EGD and colonoscopy x 2 each 2004 and 2010 without cause    . Atrial fibrillation ([place])   . BPH (benign prostatic hyperplasia)    reports aprocedure (TURP) remotely in [place].Marland KitchenNo futher f/u w/ urology  . CAD (coronary artery disease)   . Diabetes mellitus, type 2 ([place])   . Diverticulosis    left colon  . Erosive gastritis   . Fall 08/08/2016   OUTSIDE AT HOME FACIAL TRAUMA   . GERD (gastroesophageal reflux disease)   . Hyperlipidemia   . Hypertension   . Hypothyroidism   . Insomnia    transient  . Osteopenia    dexa 2-11  . Vitamin B12 deficiency     Past Surgical History:  Procedure Laterality Date  . CARDIAC CATHETERIZATION  01/31/06, 09/25/10, 09-2011    . CATARACT EXTRACTION     right  . CHOLECYSTECTOMY, LAPAROSCOPIC    . COLONOSCOPY     multiple  . CORONARY ARTERY BYPASS GRAFT     1999 stents in 2000  . ESOPHAGOGASTRODUODENOSCOPY     multiple  . GIVENS CAPSULE STUDY N/A 11/04/2012   Procedure: GIVENS CAPSULE STUDY;  Surgeon: Gatha Mayer, MD;  Location: [facility];  Service: Endoscopy;  Laterality: N/A;  . inguinal herniorrhaphies     bilateral  . LEFT HEART CATHETERIZATION WITH CORONARY ANGIOGRAM N/A 10/04/2011   Procedure: LEFT HEART CATHETERIZATION WITH CORONARY ANGIOGRAM;  Surgeon: Burnell Blanks, MD;  Location: [facility] CATH LAB;  Service: Cardiovascular;  Laterality: N/A;  . pacemaker  Pace, 2013   medtronic minix 8341  . PENILE PROSTHESIS IMPLANT  1992  . PERMANENT PACEMAKER GENERATOR CHANGE N/A 06/05/2011   Procedure: PERMANENT PACEMAKER GENERATOR CHANGE;  Surgeon: Evans Lance, MD;  Location: [facility] CATH LAB;  Service: Cardiovascular;  Laterality: N/A;  . PROSTATECTOMY     transurethral  . right hip replacement    . TRANSTHORACIC ECHOCARDIOGRAM  12/2006     Social History:  The patient  reports that he has never smoked. He has never used smokeless tobacco. He reports that he drinks about 0.6 - 1.2 oz of alcohol per week . He reports that he does not use drugs.   Family History:  The patient's family history includes Alzheimer's disease in his mother, sister, and sister; CVA in his father; Coronary artery disease in his brother; Diabetes in his other; Heart failure in his father; Mental illness in his sister; Other in his sister.    ROS:  Please see the history of present illness. All other systems are reviewed and  Negative to the above problem except as noted.    PHYSICAL EXAM: VS:  BP 118/64   Pulse 77   Ht 5\' 7"  (1.702 m)   Wt 152 lb 6.4 oz (69.1 kg)   SpO2 97%   BMI 23.87 kg/m   GEN: Well nourished, well developed, in no acute distress  HEENT:Erythemia of R ear.   Neck: no JVD, carotid bruits, or  masses Cardiac: RRR; no murmurs, rubs, or gallops,no edema  Respiratory:  clear to auscultation bilaterally, normal work of breathing GI: soft, nontender, nondistended, + BS  No hepatomegaly  MS: no deformity Moving all extremities   Skin: warm and dry, no rash Psych: euthymic mood, full affect   EKG:  EKG is not  ordered today   Lipid Panel    Component Value Date/Time   CHOL 145 12/09/2016 1622   TRIG 341.0 (H) 12/09/2016 1622   TRIG 113 03/26/2006 1118   HDL 25.80 (L) 12/09/2016 1622   CHOLHDL 6 12/09/2016 1622   VLDL 68.2 (H) 12/09/2016 1622   LDLCALC 63 10/28/2013 1431   LDLDIRECT 81.0 12/09/2016 1622      Wt Readings from Last 3 Encounters:  01/20/17 152 lb 6.4 oz (69.1 kg)  12/09/16 156 lb 3.2 oz (70.9 kg)  11/29/16 155 lb (70.3 kg)      ASSESSMENT AND PLAN:  1.  Syncope  No further spells  He is unsteady on his feet   I have recomm balance rehab  Will refer    2.  CAD  S/p CABGNo symtoms to suggest ischemia  Follow  Continue meds  33.  Permanent Atrial fib   Curr on Eliquis  Need to follow   4.  AAA  Small  5  HTN  I would not push meds further   6  Bradycarida  S/p PPM   7  Derm   Ear is red  I will try to get into clinic sooner    F/U in spring, sooner if symtpoms recur   Current medicines are reviewed at length with the patient today.  The patient does not have concerns regarding medicines.  The following changes have been made:   Labs/ tests ordered today include: No orders of the defined types were placed in this encounter.    Disposition:   FU with  in   Signed, Dorris Carnes, MD  01/20/2017 8:15 AM    [address], [place], [place]  [address] Phone: [phone]; Fax: [phone]

## 2017-01-22 DIAGNOSIS — L570 Actinic keratosis: Secondary | ICD-10-CM | POA: Diagnosis not present

## 2017-01-30 DIAGNOSIS — M25512 Pain in left shoulder: Secondary | ICD-10-CM | POA: Diagnosis not present

## 2017-01-30 DIAGNOSIS — M75121 Complete rotator cuff tear or rupture of right shoulder, not specified as traumatic: Secondary | ICD-10-CM | POA: Diagnosis not present

## 2017-02-08 ENCOUNTER — Other Ambulatory Visit: Payer: Self-pay | Admitting: Internal Medicine

## 2017-02-14 ENCOUNTER — Ambulatory Visit: Payer: Medicare HMO | Attending: Internal Medicine | Admitting: Physical Therapy

## 2017-02-14 ENCOUNTER — Encounter: Payer: Self-pay | Admitting: Physical Therapy

## 2017-02-14 DIAGNOSIS — H832X9 Labyrinthine dysfunction, unspecified ear: Secondary | ICD-10-CM | POA: Diagnosis not present

## 2017-02-14 DIAGNOSIS — M25612 Stiffness of left shoulder, not elsewhere classified: Secondary | ICD-10-CM | POA: Diagnosis not present

## 2017-02-14 DIAGNOSIS — R262 Difficulty in walking, not elsewhere classified: Secondary | ICD-10-CM | POA: Insufficient documentation

## 2017-02-14 DIAGNOSIS — M25611 Stiffness of right shoulder, not elsewhere classified: Secondary | ICD-10-CM | POA: Insufficient documentation

## 2017-02-14 DIAGNOSIS — M6281 Muscle weakness (generalized): Secondary | ICD-10-CM | POA: Insufficient documentation

## 2017-02-14 NOTE — Therapy (Signed)
[facility] [address] [place], [place], [address] Phone: [phone]   Fax:  [phone]  Physical Therapy Evaluation  Patient Details  Name: [name] MRN: [id number] Date of Birth: 1925-12-11 Referring Provider: Dorris Carnes MD  Encounter Date: 02/14/2017      PT End of Session - 02/14/17 1617    Visit Number 1   Number of Visits 17   Date for PT Re-Evaluation 04/11/17   PT Start Time 3154   PT Stop Time 1530   PT Time Calculation (min) 45 min   Activity Tolerance Patient tolerated treatment well   Behavior During Therapy [facility] for tasks assessed/performed      Past Medical History:  Diagnosis Date  . AAA (abdominal aortic aneurysm) ([place])   . Anemia due to chronic blood loss 11/25/2008   Recurrent over the years EGD and colonoscopy x 2 each 2004 and 2010 without cause    . Atrial fibrillation ([place])   . BPH (benign prostatic hyperplasia)    reports aprocedure (TURP) remotely in [place].Marland KitchenNo futher f/u w/ urology  . CAD (coronary artery disease)   . Diabetes mellitus, type 2 ([place])   . Diverticulosis    left colon  . Erosive gastritis   . Fall 08/08/2016   OUTSIDE AT HOME FACIAL TRAUMA   . GERD (gastroesophageal reflux disease)   . Hyperlipidemia   . Hypertension   . Hypothyroidism   . Insomnia    transient  . Osteopenia    dexa 2-11  . Vitamin B12 deficiency     Past Surgical History:  Procedure Laterality Date  . CARDIAC CATHETERIZATION  01/31/06, 09/25/10, 09-2011  . CATARACT EXTRACTION     right  . CHOLECYSTECTOMY, LAPAROSCOPIC    . COLONOSCOPY     multiple  . CORONARY ARTERY BYPASS GRAFT     1999 stents in 2000  . ESOPHAGOGASTRODUODENOSCOPY     multiple  . GIVENS CAPSULE STUDY N/A 11/04/2012   Procedure: GIVENS CAPSULE STUDY;  Surgeon: Gatha Mayer, MD;  Location: [facility];  Service: Endoscopy;  Laterality: N/A;  . inguinal herniorrhaphies     bilateral  . LEFT HEART CATHETERIZATION WITH  CORONARY ANGIOGRAM N/A 10/04/2011   Procedure: LEFT HEART CATHETERIZATION WITH CORONARY ANGIOGRAM;  Surgeon: Burnell Blanks, MD;  Location: [facility] CATH LAB;  Service: Cardiovascular;  Laterality: N/A;  . pacemaker  [place], 2013   medtronic minix 8341  . PENILE PROSTHESIS IMPLANT  1992  . PERMANENT PACEMAKER GENERATOR CHANGE N/A 06/05/2011   Procedure: PERMANENT PACEMAKER GENERATOR CHANGE;  Surgeon: Evans Lance, MD;  Location: [facility] CATH LAB;  Service: Cardiovascular;  Laterality: N/A;  . PROSTATECTOMY     transurethral  . right hip replacement    . TRANSTHORACIC ECHOCARDIOGRAM  12/2006    There were no vitals filed for this visit.       Subjective Assessment - 02/14/17 1605    Subjective Pt stated he's had two falls; both earlier this year in Jan and March ; He wants to get better at mvt; worried about falls; he has Psychologist, forensic and is DM; keeps BGS ranges 115-170; he has daughter who lives close by and helps with his ADL's ; she does laundry so he wont have to use strairs; he lives alone otherswise; he's HOH and both his shoulders are very limited ; he can dress himself and cooks and manages as needed with ADL's ; denies pain or dizziness but is a bit unsteady when  he stands up ;    Pertinent History Pacemaker, DM ; AAA; afib; cad; HTN; ostopenia;    Limitations Standing;Walking   How long can you sit comfortably? no problem   How long can you stand comfortably? 5 min   How long can you walk comfortably? 5 min   Currently in Pain? No/denies            [facility] PT Assessment - 02/14/17 0001      Assessment   Medical Diagnosis general weakness, fall risk , diff walking   Referring Provider Dorris Carnes MD   Onset Date/Surgical Date 07/11/16   Prior Therapy snf     Precautions   Precautions Fall   Precaution Comments severe shoulder mvt restrictions     Restrictions   Weight Bearing Restrictions No     Balance Screen   Has the patient fallen in the past 6 months No   Has  the patient had a decrease in activity level because of a fear of falling?  Yes   Is the patient reluctant to leave their home because of a fear of falling?  No     Home Environment   Living Environment Private residence   Living Arrangements Alone   Available Help at Discharge Family   Type of [place] to enter   Entrance Stairs-Number of Steps 1   [place]   Additional Comments lives on one level - daugther assists     Prior Function   Level of Independence Independent with basic ADLs   Vocation Retired     Associate Professor   Overall Cognitive Status Within Functional Limits for tasks assessed     Observation/Other Assessments   Focus on Therapeutic Outcomes (FOTO)  54 46% limited   33% predicted limitation   Other Surveys  Other Surveys   Activities of Balance Confidence Scale (ABC Scale)  25%     Coordination   Gross Motor Movements are Fluid and Coordinated Yes   Fine Motor Movements are Fluid and Coordinated Yes     Posture/Postural Control   Posture/Postural Control Postural limitations   Postural Limitations Forward head     ROM / Strength   AROM / PROM / Strength AROM     AROM   AROM Assessment Site Shoulder   Right/Left Shoulder Right;Left   Right Shoulder Flexion 25 Degrees   Left Shoulder Flexion 10 Degrees     Transfers   Transfers Sit to Stand   Sit to Stand 7: Independent     Ambulation/Gait   Ambulation/Gait Yes   Ambulation/Gait Assistance 5: Supervision   Ambulation Distance (Feet) 115 Feet   Assistive device None   Gait velocity 2.16 ft/sec   Stairs Yes   Stairs Assistance 5: Supervision   Gait Comments able to go up down gym steps step through pattern ;hand hovering over rails      Balance   Balance Assessed Yes     Standardized Balance Assessment   Standardized Balance Assessment Berg Balance Test;Dynamic Gait Index;Timed Up and Go Test;Five Times Sit to Stand     Berg Balance Test   Sit to Stand Able  to stand without using hands and stabilize independently   Standing Unsupported Able to stand safely 2 minutes   Sitting with Back Unsupported but Feet Supported on Floor or Stool Able to sit safely and securely 2 minutes   Stand to Sit Sits safely with minimal use of hands   Transfers  Able to transfer safely, definite need of hands   Standing Unsupported with Eyes Closed Able to stand 10 seconds safely   Standing Ubsupported with Feet Together Able to place feet together independently and stand 1 minute safely   From Standing, Reach Forward with Outstretched Arm Can reach forward >12 cm safely (5")   From Standing Position, Pick up Object from Floor Able to pick up shoe, needs supervision   From Standing Position, Turn to Look Behind Over each Shoulder Looks behind from both sides and weight shifts well   Turn 360 Degrees Able to turn 360 degrees safely one side only in 4 seconds or less   Standing Unsupported, Alternately Place Feet on Step/Stool Able to stand independently and safely and complete 8 steps in 20 seconds   Standing Unsupported, One Foot in Front Able to place foot tandem independently and hold 30 seconds   Standing on One Leg Able to lift leg independently and hold equal to or more than 3 seconds   Total Score 50     Dynamic Gait Index   Level Surface Mild Impairment   Change in Gait Speed Mild Impairment   Gait with Horizontal Head Turns Moderate Impairment   Gait with Vertical Head Turns Moderate Impairment   Gait and Pivot Turn Mild Impairment   Step Over Obstacle Mild Impairment   Step Around Obstacles Normal   Steps Mild Impairment   Total Score 15     Timed Up and Go Test   Normal TUG (seconds) 15     High Level Balance   High Level Balance Activites Head turns        MOD CTSIB  3/4  Position 4 failed at 20 sec 30 second sit to stand ; pt made 18  Repetitions Walking 115 ft turning head side to side looking at cards; 5 loss of balance moments ; demod step  reflex to recover with SBA     Objective measurements completed on examination: See above findings.                    PT Education - 02/14/17 1615    Education provided Yes   Education Details discussed POC and main deficit being his poor ability to disassociate his head and body mvt;  recommended he being working on sit to stand with goal of 60 per day over next two weeks to being strength training    Person(s) Educated Patient   Methods Explanation;Demonstration   Comprehension Verbalized understanding          PT Short Term Goals - 02/14/17 1625      PT SHORT TERM GOAL #1   Title Pt will increase gait speed to 3 ft/sec w/o use of assisted device; level ground w/o loss of balance for 1000' w/ RPE  < 5/ 10    Time 4   Period Weeks   Status New   Target Date 03/17/17     PT SHORT TERM GOAL #2   Title Pt will be independent with HEP focused on large stepping mvt and progressive strengthening using the OTEGA routines.    Time 4   Period Weeks   Status New   Target Date 03/17/17           PT Long Term Goals - 02/14/17 1627      PT LONG TERM GOAL #1   Title Pt will demonstrate a low fall risk status , evident by scoring > 54 on BERG  >  20 on DGI and 4/4 on MOD CTSIB   Time 8   Period Weeks   Status New   Target Date 04/16/17     PT LONG TERM GOAL #2   Title Pt will be able to walk at 4 ft /sec x 5 min with supervision with RPE < 5/10    Time 8   Period Weeks   Status New   Target Date 04/16/17     PT LONG TERM GOAL #3   Title Pt will be able to independently perform a floor to stand transfer safely and independent of need of assistance or supervision. Would like to see him doing floor transfer as part of his HEP   Time 8   Period Weeks   Target Date 04/16/17                Plan - 02/14/17 1618    Clinical Impression Statement Pt did well with objective balance tests; the main recurring deficit is his poor ability to diassociate his  head and body mvt with walking; with some education and queing he was already able to make some improvement today; he did not have any problem with excessive fatigue and no pain; no c/o lightheadnnness with transfers;  pt will benefit from skilled PT to address his functional balance deficits ; he is willing to particpate and he should be able to achieve a high level of balance and a low fall risk ; curenlty at a moderate fall risk  ( vs high)    Clinical Presentation Stable   Clinical Presentation due to: generalized weakness; a hypofunction of vestibular system and balance reactions   Rehab Potential Excellent   Clinical Impairments Affecting Rehab Potential bilateral shoulder ROM deficits;    PT Frequency 2x / week   PT Duration 8 weeks   PT Treatment/Interventions Neuromuscular re-education;Balance training;Therapeutic exercise;Therapeutic activities;Functional mobility training;Stair training;Gait training;Patient/family education;Vestibular   PT Next Visit Plan set up HEP; focus on step reflex intervention with dynamic head mvt   PT Home Exercise Plan OTEGA   Consulted and Agree with Plan of Care Patient      Patient will benefit from skilled therapeutic intervention in order to improve the following deficits and impairments:  Decreased activity tolerance, Decreased balance, Decreased coordination, Decreased range of motion, Decreased mobility, Decreased strength, Difficulty walking, Impaired flexibility  Visit Diagnosis: Difficulty in walking, not elsewhere classified - Plan: PT plan of care cert/re-cert  Muscle weakness (generalized) - Plan: PT plan of care cert/re-cert  Stiffness of left shoulder, not elsewhere classified - Plan: PT plan of care cert/re-cert  Stiffness of right shoulder, not elsewhere classified - Plan: PT plan of care cert/re-cert  Vestibular disequilibrium, unspecified laterality - Plan: PT plan of care cert/re-cert      G-Codes - 32/20/25 1633    Functional  Assessment Tool Used (Outpatient Only) DGI , BERG , MOD CTSIB   Functional Limitation Mobility: Walking and moving around   Mobility: Walking and Moving Around Current Status (K2706) At least 40 percent but less than 60 percent impaired, limited or restricted   Mobility: Walking and Moving Around Goal Status [phone]) At least 1 percent but less than 20 percent impaired, limited or restricted       Problem List Patient Active Problem List   Diagnosis Date Noted  . Rotator cuff tear, right 08/18/2016  . HLD (hyperlipidemia) 08/18/2016  . Nasal bone fracture 08/18/2016  . Syncope and collapse 08/08/2016  . SAH (subarachnoid hemorrhage) ([place]) 08/08/2016  .  PCP NOTES >>>>>>>>>>>>>>>>>>>> 09/28/2015  . Vasomotor rhinitis 09/14/2014  . Cough 03/01/2014  . Right shoulder injury 03/15/2013  . Hiatal hernia 01/28/2013  . Erosive gastritis 01/28/2013  . Warfarin anticoagulation 01/28/2013  . Medicare annual wellness visit, subsequent 07/14/2012  . BPH (benign prostatic hyperplasia) 06/07/2009  . Osteopenia 06/07/2009  . Anemia due to chronic blood loss 11/25/2008  . Vitamin B 12 deficiency 06/01/2008  . HTN (hypertension) 06/01/2008  . PACEMAKER, PERMANENT 06/01/2008  . GERD 03/31/2007  . Hypothyroidism 01/26/2007  . DM (diabetes mellitus) with complications ([place]) 57/26/2035  . Permanent atrial fibrillation ([place]) 01/26/2007  . Dyslipidemia 10/30/2006  . Coronary artery disease involving native coronary artery of native heart without angina pectoris 10/02/2006  . HIP REPLACEMENT, RIGHT, HX OF 02/04/2003    Rosaura Carpenter D PT DPT  02/14/2017, 4:46 PM  [facility] [address] [place], [place], [address] Phone: [phone]   Fax:  [phone]  Name: STEAVEN WHOLEY MRN: [id number] Date of Birth: 1925/07/14

## 2017-02-18 ENCOUNTER — Encounter: Payer: Self-pay | Admitting: Physical Therapy

## 2017-02-18 ENCOUNTER — Ambulatory Visit: Payer: Medicare HMO | Admitting: Physical Therapy

## 2017-02-18 DIAGNOSIS — R262 Difficulty in walking, not elsewhere classified: Secondary | ICD-10-CM

## 2017-02-18 DIAGNOSIS — M25612 Stiffness of left shoulder, not elsewhere classified: Secondary | ICD-10-CM | POA: Diagnosis not present

## 2017-02-18 DIAGNOSIS — M25611 Stiffness of right shoulder, not elsewhere classified: Secondary | ICD-10-CM | POA: Diagnosis not present

## 2017-02-18 DIAGNOSIS — M6281 Muscle weakness (generalized): Secondary | ICD-10-CM

## 2017-02-18 DIAGNOSIS — H832X9 Labyrinthine dysfunction, unspecified ear: Secondary | ICD-10-CM | POA: Diagnosis not present

## 2017-02-18 NOTE — Patient Instructions (Signed)
  Patient issued OTAGO notebook and asked to bring it to PT each time.

## 2017-02-18 NOTE — Therapy (Signed)
[facility] [address] [place], [place], [address] Phone: [phone]   Fax:  [phone]  Physical Therapy Treatment  Patient Details  Name: [name] MRN: [id number] Date of Birth: Mar 12, 1926 Referring Provider: Dorris Carnes MD  Encounter Date: 02/18/2017      PT End of Session - 02/18/17 1950    Visit Number 2   Number of Visits 17   Date for PT Re-Evaluation 04/11/17   PT Start Time 1400   PT Stop Time 1444   PT Time Calculation (min) 44 min   Equipment Utilized During Treatment Gait belt   Activity Tolerance Patient tolerated treatment well   Behavior During Therapy [facility] for tasks assessed/performed      Past Medical History:  Diagnosis Date  . AAA (abdominal aortic aneurysm) ([place])   . Anemia due to chronic blood loss 11/25/2008   Recurrent over the years EGD and colonoscopy x 2 each 2004 and 2010 without cause    . Atrial fibrillation ([place])   . BPH (benign prostatic hyperplasia)    reports aprocedure (TURP) remotely in [place].Marland KitchenNo futher f/u w/ urology  . CAD (coronary artery disease)   . Diabetes mellitus, type 2 ([place])   . Diverticulosis    left colon  . Erosive gastritis   . Fall 08/08/2016   OUTSIDE AT HOME FACIAL TRAUMA   . GERD (gastroesophageal reflux disease)   . Hyperlipidemia   . Hypertension   . Hypothyroidism   . Insomnia    transient  . Osteopenia    dexa 2-11  . Vitamin B12 deficiency     Past Surgical History:  Procedure Laterality Date  . CARDIAC CATHETERIZATION  01/31/06, 09/25/10, 09-2011  . CATARACT EXTRACTION     right  . CHOLECYSTECTOMY, LAPAROSCOPIC    . COLONOSCOPY     multiple  . CORONARY ARTERY BYPASS GRAFT     1999 stents in 2000  . ESOPHAGOGASTRODUODENOSCOPY     multiple  . GIVENS CAPSULE STUDY N/A 11/04/2012   Procedure: GIVENS CAPSULE STUDY;  Surgeon: Gatha Mayer, MD;  Location: [facility];  Service: Endoscopy;  Laterality: N/A;  . inguinal herniorrhaphies      bilateral  . LEFT HEART CATHETERIZATION WITH CORONARY ANGIOGRAM N/A 10/04/2011   Procedure: LEFT HEART CATHETERIZATION WITH CORONARY ANGIOGRAM;  Surgeon: Burnell Blanks, MD;  Location: [facility] CATH LAB;  Service: Cardiovascular;  Laterality: N/A;  . pacemaker  [place], 2013   medtronic minix 8341  . PENILE PROSTHESIS IMPLANT  1992  . PERMANENT PACEMAKER GENERATOR CHANGE N/A 06/05/2011   Procedure: PERMANENT PACEMAKER GENERATOR CHANGE;  Surgeon: Evans Lance, MD;  Location: [facility] CATH LAB;  Service: Cardiovascular;  Laterality: N/A;  . PROSTATECTOMY     transurethral  . right hip replacement    . TRANSTHORACIC ECHOCARDIOGRAM  12/2006    There were no vitals filed for this visit.      Subjective Assessment - 02/18/17 1356    Subjective I've been doing the 100 sit to stands like he told me to do. It hasn't made me sore.    Patient is accompained by: Family member  daughter, [name]   Pertinent History Pacemaker, DM ; AAA; afib; cad; HTN; ostopenia;    Limitations Standing;Walking   How long can you sit comfortably? no problem   How long can you stand comfortably? 5 min   How long can you walk comfortably? 5 min   Currently in Pain? No/denies  [facility] Adult PT Treatment/Exercise - 02/18/17 1441      Transfers   Transfers Sit to Stand;Stand to Sit   Sit to Stand 7: Independent     Ambulation/Gait   Ambulation/Gait Yes   Ambulation/Gait Assistance 4: Min assist   Ambulation/Gait Assistance Details walking straight ahead and turning head left and right for 3-4 steps in a row; veers off path with occasional min assist to maintain balance   Ambulation Distance (Feet) 160 Feet   Assistive device None   Gait Pattern Step-through pattern;Decreased arm swing - right;Decreased arm swing - left;Right flexed knee in stance;Left flexed knee in stance;Decreased trunk rotation;Poor foot clearance - left;Poor foot clearance - right   Ambulation Surface  Level             Balance Exercises - 02/18/17 1443      OTAGO PROGRAM   Knee Extensor 10 reps   Hip ABductor 20 reps  bil UE support   Ankle Plantorflexors 20 reps, support   Ankle Dorsiflexors 20 reps, support   Backwards Walking Support   Sideways Walking Assistive device   Tandem Walk Support   One Leg Stand 10 seconds, support   Heel Walking Support   Toe Walk Support           PT Education - 02/18/17 1949    Education provided Yes   Education Details initiated OTAGO HEP   Person(s) Educated Patient   Methods Explanation;Demonstration;Handout;Verbal cues   Comprehension Verbalized understanding;Returned demonstration;Verbal cues required;Need further instruction          PT Short Term Goals - 02/14/17 1625      PT SHORT TERM GOAL #1   Title Pt will increase gait speed to 3 ft/sec w/o use of assisted device; level ground w/o loss of balance for 1000' w/ RPE  < 5/ 10    Time 4   Period Weeks   Status New   Target Date 03/17/17     PT SHORT TERM GOAL #2   Title Pt will be independent with HEP focused on large stepping mvt and progressive strengthening using the OTEGA routines.    Time 4   Period Weeks   Status New   Target Date 03/17/17           PT Long Term Goals - 02/14/17 1627      PT LONG TERM GOAL #1   Title Pt will demonstrate a low fall risk status , evident by scoring > 54 on BERG  > 20 on DGI and 4/4 on MOD CTSIB   Time 8   Period Weeks   Status New   Target Date 04/16/17     PT LONG TERM GOAL #2   Title Pt will be able to walk at 4 ft /sec x 5 min with supervision with RPE < 5/10    Time 8   Period Weeks   Status New   Target Date 04/16/17     PT LONG TERM GOAL #3   Title Pt will be able to independently perform a floor to stand transfer safely and independent of need of assistance or supervision. Would like to see him doing floor transfer as part of his HEP   Time 8   Period Weeks   Target Date 04/16/17                Plan - 02/18/17 1952    Clinical Impression Statement Session focused on goal of establishing a HEP to improve strength and  balance. Patient demonstrated ability to learn exercises with good technique. Additional time spent on gait training including with head turns. Pateint is progressing towards goals.    Rehab Potential Excellent   Clinical Impairments Affecting Rehab Potential bilateral shoulder ROM deficits;    PT Frequency 2x / week   PT Duration 8 weeks   PT Treatment/Interventions Neuromuscular re-education;Balance training;Therapeutic exercise;Therapeutic activities;Functional mobility training;Stair training;Gait training;Patient/family education;Vestibular   PT Next Visit Plan set up HEP; focus on step reflex intervention with dynamic head mvt   PT Home Exercise Plan OTEGA   Consulted and Agree with Plan of Care Patient      Patient will benefit from skilled therapeutic intervention in order to improve the following deficits and impairments:  Decreased activity tolerance, Decreased balance, Decreased coordination, Decreased range of motion, Decreased mobility, Decreased strength, Difficulty walking, Impaired flexibility  Visit Diagnosis: Difficulty in walking, not elsewhere classified  Muscle weakness (generalized)     Problem List Patient Active Problem List   Diagnosis Date Noted  . Rotator cuff tear, right 08/18/2016  . HLD (hyperlipidemia) 08/18/2016  . Nasal bone fracture 08/18/2016  . Syncope and collapse 08/08/2016  . SAH (subarachnoid hemorrhage) ([employer]) 08/08/2016  . PCP NOTES >>>>>>>>>>>>>>>>>>>> 09/28/2015  . Vasomotor rhinitis 09/14/2014  . Cough 03/01/2014  . Right shoulder injury 03/15/2013  . Hiatal hernia 01/28/2013  . Erosive gastritis 01/28/2013  . Warfarin anticoagulation 01/28/2013  . Medicare annual wellness visit, subsequent 07/14/2012  . BPH (benign prostatic hyperplasia) 06/07/2009  . Osteopenia 06/07/2009  . Anemia due to chronic blood  loss 11/25/2008  . Vitamin B 12 deficiency 06/01/2008  . HTN (hypertension) 06/01/2008  . PACEMAKER, PERMANENT 06/01/2008  . GERD 03/31/2007  . Hypothyroidism 01/26/2007  . DM (diabetes mellitus) with complications ([place]) 77/82/4235  . Permanent atrial fibrillation ([place]) 01/26/2007  . Dyslipidemia 10/30/2006  . Coronary artery disease involving native coronary artery of native heart without angina pectoris 10/02/2006  . HIP REPLACEMENT, RIGHT, HX OF 02/04/2003    Rexanne Mano, PT 02/18/2017, 7:55 PM  [facility] [address] [place], [place], [address] Phone: [phone]   Fax:  [phone]  Name: [name] MRN: [id number] Date of Birth: 07/29/1925

## 2017-02-20 ENCOUNTER — Encounter: Payer: Self-pay | Admitting: Physical Therapy

## 2017-02-20 ENCOUNTER — Ambulatory Visit: Payer: Medicare HMO | Admitting: Physical Therapy

## 2017-02-20 DIAGNOSIS — H832X9 Labyrinthine dysfunction, unspecified ear: Secondary | ICD-10-CM

## 2017-02-20 DIAGNOSIS — R262 Difficulty in walking, not elsewhere classified: Secondary | ICD-10-CM | POA: Diagnosis not present

## 2017-02-20 DIAGNOSIS — M25612 Stiffness of left shoulder, not elsewhere classified: Secondary | ICD-10-CM | POA: Diagnosis not present

## 2017-02-20 DIAGNOSIS — L244 Irritant contact dermatitis due to drugs in contact with skin: Secondary | ICD-10-CM | POA: Diagnosis not present

## 2017-02-20 DIAGNOSIS — M6281 Muscle weakness (generalized): Secondary | ICD-10-CM | POA: Diagnosis not present

## 2017-02-20 DIAGNOSIS — M25611 Stiffness of right shoulder, not elsewhere classified: Secondary | ICD-10-CM | POA: Diagnosis not present

## 2017-02-23 NOTE — Therapy (Signed)
[facility] [address] [place], [place], [address] Phone: [phone]   Fax:  [phone]  Physical Therapy Treatment  Patient Details  Name: [name] MRN: [id number] Date of Birth: May 10, 1926 Referring Provider: Dorris Carnes MD  Encounter Date: 02/20/2017      PT End of Session - 02/23/17 1636    Visit Number 3   Number of Visits 17   Date for PT Re-Evaluation 04/11/17   PT Start Time 4403   PT Stop Time 1528   PT Time Calculation (min) 43 min   Equipment Utilized During Treatment Gait belt   Activity Tolerance Patient tolerated treatment well   Behavior During Therapy [facility] for tasks assessed/performed      Past Medical History:  Diagnosis Date  . AAA (abdominal aortic aneurysm) ([place])   . Anemia due to chronic blood loss 11/25/2008   Recurrent over the years EGD and colonoscopy x 2 each 2004 and 2010 without cause    . Atrial fibrillation ([place])   . BPH (benign prostatic hyperplasia)    reports aprocedure (TURP) remotely in [place].Marland KitchenNo futher f/u w/ urology  . CAD (coronary artery disease)   . Diabetes mellitus, type 2 ([place])   . Diverticulosis    left colon  . Erosive gastritis   . Fall 08/08/2016   OUTSIDE AT HOME FACIAL TRAUMA   . GERD (gastroesophageal reflux disease)   . Hyperlipidemia   . Hypertension   . Hypothyroidism   . Insomnia    transient  . Osteopenia    dexa 2-11  . Vitamin B12 deficiency     Past Surgical History:  Procedure Laterality Date  . CARDIAC CATHETERIZATION  01/31/06, 09/25/10, 09-2011  . CATARACT EXTRACTION     right  . CHOLECYSTECTOMY, LAPAROSCOPIC    . COLONOSCOPY     multiple  . CORONARY ARTERY BYPASS GRAFT     1999 stents in 2000  . ESOPHAGOGASTRODUODENOSCOPY     multiple  . GIVENS CAPSULE STUDY N/A 11/04/2012   Procedure: GIVENS CAPSULE STUDY;  Surgeon: Gatha Mayer, MD;  Location: [facility];  Service: Endoscopy;  Laterality: N/A;  . inguinal herniorrhaphies      bilateral  . LEFT HEART CATHETERIZATION WITH CORONARY ANGIOGRAM N/A 10/04/2011   Procedure: LEFT HEART CATHETERIZATION WITH CORONARY ANGIOGRAM;  Surgeon: Burnell Blanks, MD;  Location: [facility] CATH LAB;  Service: Cardiovascular;  Laterality: N/A;  . pacemaker  [place], 2013   medtronic minix 8341  . PENILE PROSTHESIS IMPLANT  1992  . PERMANENT PACEMAKER GENERATOR CHANGE N/A 06/05/2011   Procedure: PERMANENT PACEMAKER GENERATOR CHANGE;  Surgeon: Evans Lance, MD;  Location: [facility] CATH LAB;  Service: Cardiovascular;  Laterality: N/A;  . PROSTATECTOMY     transurethral  . right hip replacement    . TRANSTHORACIC ECHOCARDIOGRAM  12/2006    There were no vitals filed for this visit.      Subjective Assessment - 02/23/17 1644    Subjective No new changes. Reports he got through all the exercises and has no questions.    Pertinent History Pacemaker, DM ; AAA; afib; cad; HTN; ostopenia;    Limitations Standing;Walking   How long can you sit comfortably? no problem   How long can you stand comfortably? 5 min   How long can you walk comfortably? 5 min                         [facility] Adult PT  Treatment/Exercise - 02/23/17 1629      Transfers   Transfers Sit to Stand;Stand to Sit   Sit to Stand 4: Min guard   Sit to Stand Details (indicate cue type and reason) on blue airex foam from 20" surface x 10; from 19" x 10; minguard for safety with imbalance              Balance Exercises - 02/23/17 1630      Balance Exercises: Standing   Standing Eyes Opened Narrow base of support (BOS);Wide (BOA);Head turns;Foam/compliant surface;30 secs  2 pillows   Standing Eyes Closed Narrow base of support (BOS);Wide (BOA);Foam/compliant surface;30 secs   Stepping Strategy Anterior;Posterior;UE support  stepping off balance beam when LOB; stops with 1 step   Balance Beam black beam EO x 60 seconds with +ankle & hip strategies; step off/on with single UE support x 10 each LE;     Tandem Gait Forward;Intermittent upper extremity support;4 reps  // bars   Retro Gait 4 reps   Sidestepping 4 reps   Step Over Hurdles / [place] black beams, 1/2 roller x 4 reps   Heel Raises Limitations x 10 bil UE   Toe Raise Limitations x 10 bil UE             PT Short Term Goals - 02/14/17 1625      PT SHORT TERM GOAL #1   Title Pt will increase gait speed to 3 ft/sec w/o use of assisted device; level ground w/o loss of balance for 1000' w/ RPE  < 5/ 10    Time 4   Period Weeks   Status New   Target Date 03/17/17     PT SHORT TERM GOAL #2   Title Pt will be independent with HEP focused on large stepping mvt and progressive strengthening using the OTEGA routines.    Time 4   Period Weeks   Status New   Target Date 03/17/17           PT Long Term Goals - 02/14/17 1627      PT LONG TERM GOAL #1   Title Pt will demonstrate a low fall risk status , evident by scoring > 54 on BERG  > 20 on DGI and 4/4 on MOD CTSIB   Time 8   Period Weeks   Status New   Target Date 04/16/17     PT LONG TERM GOAL #2   Title Pt will be able to walk at 4 ft /sec x 5 min with supervision with RPE < 5/10    Time 8   Period Weeks   Status New   Target Date 04/16/17     PT LONG TERM GOAL #3   Title Pt will be able to independently perform a floor to stand transfer safely and independent of need of assistance or supervision. Would like to see him doing floor transfer as part of his HEP   Time 8   Period Weeks   Target Date 04/16/17               Plan - 02/23/17 1638    Clinical Impression Statement Session focused on balance training including eliciting ankle, hip and stepping strategies and increasing demands on his vestibular system with use of compliant surfaces and eyes closed. Patient did well demonstrating the ability to improve his responses over multiple repetitions. Patient very pleased with his progress so far and will continue to benefit from PT.    Rehab Potential  Excellent   Clinical Impairments Affecting Rehab Potential bilateral shoulder ROM deficits;    PT Frequency 2x / week   PT Duration 8 weeks   PT Treatment/Interventions Neuromuscular re-education;Balance training;Therapeutic exercise;Therapeutic activities;Functional mobility training;Stair training;Gait training;Patient/family education;Vestibular   PT Next Visit Plan continue to update OTAGA HEP; balance training with ankle, hip and stepping strategies, compliant surfaces, etc   PT Home Exercise Plan OTAGA   Consulted and Agree with Plan of Care Patient      Patient will benefit from skilled therapeutic intervention in order to improve the following deficits and impairments:  Decreased activity tolerance, Decreased balance, Decreased coordination, Decreased range of motion, Decreased mobility, Decreased strength, Difficulty walking, Impaired flexibility  Visit Diagnosis: Vestibular disequilibrium, unspecified laterality     Problem List Patient Active Problem List   Diagnosis Date Noted  . Rotator cuff tear, right 08/18/2016  . HLD (hyperlipidemia) 08/18/2016  . Nasal bone fracture 08/18/2016  . Syncope and collapse 08/08/2016  . SAH (subarachnoid hemorrhage) ([place]) 08/08/2016  . PCP NOTES >>>>>>>>>>>>>>>>>>>> 09/28/2015  . Vasomotor rhinitis 09/14/2014  . Cough 03/01/2014  . Right shoulder injury 03/15/2013  . Hiatal hernia 01/28/2013  . Erosive gastritis 01/28/2013  . Warfarin anticoagulation 01/28/2013  . Medicare annual wellness visit, subsequent 07/14/2012  . BPH (benign prostatic hyperplasia) 06/07/2009  . Osteopenia 06/07/2009  . Anemia due to chronic blood loss 11/25/2008  . Vitamin B 12 deficiency 06/01/2008  . HTN (hypertension) 06/01/2008  . PACEMAKER, PERMANENT 06/01/2008  . GERD 03/31/2007  . Hypothyroidism 01/26/2007  . DM (diabetes mellitus) with complications ([place]) 19/37/9024  . Permanent atrial fibrillation ([place]) 01/26/2007  . Dyslipidemia 10/30/2006  .  Coronary artery disease involving native coronary artery of native heart without angina pectoris 10/02/2006  . HIP REPLACEMENT, RIGHT, HX OF 02/04/2003    Rexanne Mano, PT 02/23/2017, 4:44 PM  [facility] [address] [place], [place], [address] Phone: [phone]   Fax:  [phone]  Name: [name] MRN: [id number] Date of Birth: 1925/10/15

## 2017-02-24 ENCOUNTER — Other Ambulatory Visit: Payer: Self-pay | Admitting: Internal Medicine

## 2017-03-03 ENCOUNTER — Ambulatory Visit: Payer: Medicare HMO | Admitting: Physical Therapy

## 2017-03-03 ENCOUNTER — Encounter: Payer: Self-pay | Admitting: Physical Therapy

## 2017-03-03 VITALS — BP 139/71 | HR 71

## 2017-03-03 DIAGNOSIS — M25612 Stiffness of left shoulder, not elsewhere classified: Secondary | ICD-10-CM | POA: Diagnosis not present

## 2017-03-03 DIAGNOSIS — R262 Difficulty in walking, not elsewhere classified: Secondary | ICD-10-CM | POA: Diagnosis not present

## 2017-03-03 DIAGNOSIS — M6281 Muscle weakness (generalized): Secondary | ICD-10-CM | POA: Diagnosis not present

## 2017-03-03 DIAGNOSIS — M25611 Stiffness of right shoulder, not elsewhere classified: Secondary | ICD-10-CM | POA: Diagnosis not present

## 2017-03-03 DIAGNOSIS — H832X9 Labyrinthine dysfunction, unspecified ear: Secondary | ICD-10-CM | POA: Diagnosis not present

## 2017-03-03 NOTE — Therapy (Signed)
[facility] [address] [place], [place], [address] Phone: [phone]   Fax:  [phone]  Physical Therapy Treatment  Patient Details  Name: [name] MRN: [id number] Date of Birth: [birth year] Referring Provider: Dorris Carnes MD  Encounter Date: 03/03/2017      PT End of Session - 03/03/17 1526    Visit Number 4   Number of Visits 17   Date for PT Re-Evaluation 04/11/17   PT Start Time 1450   PT Stop Time 1526   PT Time Calculation (min) 36 min   Activity Tolerance Patient limited by fatigue  Stated not feeling well today--not sure why other than been very busy   Behavior During Therapy [facility] for tasks assessed/performed      Past Medical History:  Diagnosis Date  . AAA (abdominal aortic aneurysm) ([place])   . Anemia due to chronic blood loss 11/25/2008   Recurrent over the years EGD and colonoscopy x 2 each 2004 and 2010 without cause    . Atrial fibrillation ([place])   . BPH (benign prostatic hyperplasia)    reports aprocedure (TURP) remotely in [place] KitchenNo futher f/u w/ urology  . CAD (coronary artery disease)   . Diabetes mellitus, type 2 ([place])   . Diverticulosis    left colon  . Erosive gastritis   . Fall 08/08/2016   OUTSIDE AT HOME FACIAL TRAUMA   . GERD (gastroesophageal reflux disease)   . Hyperlipidemia   . Hypertension   . Hypothyroidism   . Insomnia    transient  . Osteopenia    dexa 2-11  . Vitamin B12 deficiency     Past Surgical History:  Procedure Laterality Date  . CARDIAC CATHETERIZATION  01/31/06, 09/25/10, 09-2011  . CATARACT EXTRACTION     right  . CHOLECYSTECTOMY, LAPAROSCOPIC    . COLONOSCOPY     multiple  . CORONARY ARTERY BYPASS GRAFT     1999 stents in 2000  . ESOPHAGOGASTRODUODENOSCOPY     multiple  . GIVENS CAPSULE STUDY N/A 11/04/2012   Procedure: GIVENS CAPSULE STUDY;  Surgeon: Gatha Mayer, MD;  Location: [facility] ENDOSCOPY;  Service: Endoscopy;  Laterality: N/A;  . inguinal  herniorrhaphies     bilateral  . LEFT HEART CATHETERIZATION WITH CORONARY ANGIOGRAM N/A 10/04/2011   Procedure: LEFT HEART CATHETERIZATION WITH CORONARY ANGIOGRAM;  Surgeon: Burnell Blanks, MD;  Location: [facility] CATH LAB;  Service: Cardiovascular;  Laterality: N/A;  . pacemaker  [place], 2013   medtronic minix 8341  . PENILE PROSTHESIS IMPLANT  1992  . PERMANENT PACEMAKER GENERATOR CHANGE N/A 06/05/2011   Procedure: PERMANENT PACEMAKER GENERATOR CHANGE;  Surgeon: Evans Lance, MD;  Location: [facility] CATH LAB;  Service: Cardiovascular;  Laterality: N/A;  . PROSTATECTOMY     transurethral  . right hip replacement    . TRANSTHORACIC ECHOCARDIOGRAM  12/2006    Vitals:   03/03/17 1500  BP: 139/71  Pulse: 71        Subjective Assessment - 03/03/17 1452    Subjective Not feeling well today. Had to take car in to shop, get a car from a car rental agency. Feeling worn out.    Pertinent History Pacemaker, DM ; AAA; afib; cad; HTN; ostopenia;    Limitations Standing;Walking   How long can you sit comfortably? no problem   How long can you stand comfortably? 5 min   How long can you walk comfortably? 5 min   Currently in Pain? No/denies  [facility] Adult PT Treatment/Exercise - 03/03/17 0001      Exercises   Exercises Knee/Hip     Knee/Hip Exercises: Supine   Bridges with Clamshell Strengthening;Both;1 set;10 reps  red band   Other Supine Knee/Hip Exercises clam with red band x 15     Knee/Hip Exercises: Sidelying   Clams 10 reps each side red band             Balance Exercises - 03/03/17 1502      OTAGO PROGRAM   Head Movements Sitting;5 reps   Neck Movements Sitting;5 reps   Ankle Movements Sitting;10 reps   Knee Extensor 10 reps  red band   Knee Flexor 10 reps  red band   Ankle Dorsiflexors 20 reps, no support  seated           PT Education - 03/03/17 1523    Education provided Yes   Education Details bring OTAGO  booklet each time   Person(s) Educated Patient   Methods Explanation   Comprehension Verbalized understanding          PT Short Term Goals - 02/14/17 1625      PT SHORT TERM GOAL #1   Title Pt will increase gait speed to 3 ft/sec w/o use of assisted device; level ground w/o loss of balance for 1000' w/ RPE  < 5/ 10    Time 4   Period Weeks   Status New   Target Date 03/17/17     PT SHORT TERM GOAL #2   Title Pt will be independent with HEP focused on large stepping mvt and progressive strengthening using the OTEGA routines.    Time 4   Period Weeks   Status New   Target Date 03/17/17           PT Long Term Goals - 02/14/17 1627      PT LONG TERM GOAL #1   Title Pt will demonstrate a low fall risk status , evident by scoring > 54 on BERG  > 20 on DGI and 4/4 on MOD CTSIB   Time 8   Period Weeks   Status New   Target Date 04/16/17     PT LONG TERM GOAL #2   Title Pt will be able to walk at 4 ft /sec x 5 min with supervision with RPE < 5/10    Time 8   Period Weeks   Status New   Target Date 04/16/17     PT LONG TERM GOAL #3   Title Pt will be able to independently perform a floor to stand transfer safely and independent of need of assistance or supervision. Would like to see him doing floor transfer as part of his HEP   Time 8   Period Weeks   Target Date 04/16/17               Plan - 03/03/17 1528    Clinical Impression Statement Patient not feeling well-exhausted from errands today. BP normal and reports he has eaten today and does not feel his blood sugar is low. Patient able to fully participate in seated and supine exercises for bil LEs for strengtheing. Will cointiue to benefit from PT to work towards [employer]   Clinical Impairments Affecting Rehab Potential bilateral shoulder ROM deficits;    PT Frequency 2x / week   PT Duration 8 weeks   PT Treatment/Interventions Neuromuscular re-education;Balance training;Therapeutic  exercise;Therapeutic activities;Functional mobility training;Stair training;Gait training;Patient/family education;Vestibular  PT Next Visit Plan continue to update OTAGA HEP; balance training with ankle, hip and stepping strategies, compliant surfaces, etc   PT Home Exercise Plan OTAGA   Consulted and Agree with Plan of Care Patient      Patient will benefit from skilled therapeutic intervention in order to improve the following deficits and impairments:  Decreased activity tolerance, Decreased balance, Decreased coordination, Decreased range of motion, Decreased mobility, Decreased strength, Difficulty walking, Impaired flexibility  Visit Diagnosis: Difficulty in walking, not elsewhere classified  Muscle weakness (generalized)     Problem List Patient Active Problem List   Diagnosis Date Noted  . Rotator cuff tear, right 08/18/2016  . HLD (hyperlipidemia) 08/18/2016  . Nasal bone fracture 08/18/2016  . Syncope and collapse 08/08/2016  . SAH (subarachnoid hemorrhage) ([place]) 08/08/2016  . PCP NOTES >>>>>>>>>>>>>>>>>>>> 09/28/2015  . Vasomotor rhinitis 09/14/2014  . Cough 03/01/2014  . Right shoulder injury 03/15/2013  . Hiatal hernia 01/28/2013  . Erosive gastritis 01/28/2013  . Warfarin anticoagulation 01/28/2013  . Medicare annual wellness visit, subsequent 07/14/2012  . BPH (benign prostatic hyperplasia) 06/07/2009  . Osteopenia 06/07/2009  . Anemia due to chronic blood loss 11/25/2008  . Vitamin B 12 deficiency 06/01/2008  . HTN (hypertension) 06/01/2008  . PACEMAKER, PERMANENT 06/01/2008  . GERD 03/31/2007  . Hypothyroidism 01/26/2007  . DM (diabetes mellitus) with complications ([place]) 41/63/8453  . Permanent atrial fibrillation ([place]) 01/26/2007  . Dyslipidemia 10/30/2006  . Coronary artery disease involving native coronary artery of native heart without angina pectoris 10/02/2006  . HIP REPLACEMENT, RIGHT, HX OF 02/04/2003    Rexanne Mano, PT 03/03/2017, 8:56  PM  [facility] [address] [place], [place], [address] Phone: [phone]   Fax:  [phone]  Name: [name] MRN: [id number] Date of Birth: November 06, 1925

## 2017-03-07 ENCOUNTER — Encounter: Payer: Self-pay | Admitting: Physical Therapy

## 2017-03-07 ENCOUNTER — Ambulatory Visit: Payer: Medicare HMO | Admitting: Physical Therapy

## 2017-03-07 DIAGNOSIS — M6281 Muscle weakness (generalized): Secondary | ICD-10-CM | POA: Diagnosis not present

## 2017-03-07 DIAGNOSIS — H832X9 Labyrinthine dysfunction, unspecified ear: Secondary | ICD-10-CM | POA: Diagnosis not present

## 2017-03-07 DIAGNOSIS — M25612 Stiffness of left shoulder, not elsewhere classified: Secondary | ICD-10-CM | POA: Diagnosis not present

## 2017-03-07 DIAGNOSIS — R262 Difficulty in walking, not elsewhere classified: Secondary | ICD-10-CM

## 2017-03-07 DIAGNOSIS — M25611 Stiffness of right shoulder, not elsewhere classified: Secondary | ICD-10-CM | POA: Diagnosis not present

## 2017-03-07 NOTE — Therapy (Signed)
[facility] [address] [place], [place], [address] Phone: [phone]   Fax:  [phone]  Physical Therapy Treatment  Patient Details  Name: [name] MRN: [id number] Date of Birth: 10/25/1925 Referring Provider: Dorris Carnes MD  Encounter Date: 03/07/2017      PT End of Session - 03/07/17 1708    Visit Number 5   Number of Visits 17   Date for PT Re-Evaluation 04/11/17   PT Start Time 1400   PT Stop Time 1444   PT Time Calculation (min) 44 min   Equipment Utilized During Treatment --  used pt's leather belt   Activity Tolerance Patient tolerated treatment well   Behavior During Therapy WFL for tasks assessed/performed      Past Medical History:  Diagnosis Date  . AAA (abdominal aortic aneurysm) ([place])   . Anemia due to chronic blood loss 11/25/2008   Recurrent over the years EGD and colonoscopy x 2 each 2004 and 2010 without cause    . Atrial fibrillation ([place])   . BPH (benign prostatic hyperplasia)    reports aprocedure (TURP) remotely in [place].Marland KitchenNo futher f/u w/ urology  . CAD (coronary artery disease)   . Diabetes mellitus, type 2 ([place])   . Diverticulosis    left colon  . Erosive gastritis   . Fall 08/08/2016   OUTSIDE AT HOME FACIAL TRAUMA   . GERD (gastroesophageal reflux disease)   . Hyperlipidemia   . Hypertension   . Hypothyroidism   . Insomnia    transient  . Osteopenia    dexa 2-11  . Vitamin B12 deficiency     Past Surgical History:  Procedure Laterality Date  . CARDIAC CATHETERIZATION  01/31/06, 09/25/10, 09-2011  . CATARACT EXTRACTION     right  . CHOLECYSTECTOMY, LAPAROSCOPIC    . COLONOSCOPY     multiple  . CORONARY ARTERY BYPASS GRAFT     1999 stents in 2000  . ESOPHAGOGASTRODUODENOSCOPY     multiple  . GIVENS CAPSULE STUDY N/A 11/04/2012   Procedure: GIVENS CAPSULE STUDY;  Surgeon: Gatha Mayer, MD;  Location: [facility] ENDOSCOPY;  Service: Endoscopy;  Laterality: N/A;  . inguinal  herniorrhaphies     bilateral  . LEFT HEART CATHETERIZATION WITH CORONARY ANGIOGRAM N/A 10/04/2011   Procedure: LEFT HEART CATHETERIZATION WITH CORONARY ANGIOGRAM;  Surgeon: Burnell Blanks, MD;  Location[facility] C[facility] LAB;  Service: Cardiovascular;  Laterality: N/A;  . pacemaker  [place], 2013   medtronic minix 8341  . PENILE PROSTHESIS IMPLANT  1992  . PERMANENT PACEMAKER GENERATOR CHANGE N/A 06/05/2011   Procedure: PERMANENT PACEMAKER GENERATOR CHANGE;  Surgeon: Evans Lance, MD;  Location: [facility] CATH LAB;  Service: Cardiovascular;  Laterality: N/A;  . PROSTATECTOMY     transurethral  . right hip replacement    . TRANSTHORACIC ECHOCARDIOGRAM  12/2006    There were no vitals filed for this visit.      Subjective Assessment - 03/07/17 1359    Subjective Feeling better. Doing some exercises each time but not all at once.    Pertinent History Pacemaker, DM ; AAA; afib; cad; HTN; ostopenia;    Limitations Standing;Walking   How long can you sit comfortably? no problem   How long can you stand comfortably? 5 min   How long can you walk comfortably? 5 min   Currently in Pain? No/denies  [facility] Adult PT Treatment/Exercise - 03/07/17 0001      Transfers   Transfers Sit to Stand;Stand to Sit   Sit to Stand 4: Min guard   Sit to Stand Details (indicate cue type and reason) on blue airex     Knee/Hip Exercises: Supine   Bridges Limitations x 5 reps with 10 second hold   Single Leg Bridge Strengthening;Both;1 set;5 reps             Balance Exercises - 03/07/17 1426      Balance Exercises: Standing   Tandem Stance Eyes open;Foam/compliant surface;Intermittent upper extremity support;2 reps;30 secs  blue mat folded   SLS with Vectors Foam/compliant surface  red mat; single, double taps; cone over/up;    Wall Bumps Hip   Wall Bumps-Hips Eyes opened;Anterior/posterior;10 reps;Foam/compliant surface  blue mat doubled   Rockerboard  Anterior/posterior;Lateral;Head turns;EO;EC;Intermittent UE support   Step Ups Forward;6 inch   Balance Beam blue beam EO, head turns   Tandem Gait Forward;4 reps   Retro Gait 4 reps   Sidestepping 4 reps   Marching Limitations 1 length   Heel Raises Limitations on red mat   Sit to Stand Time on blue airex x 10 (various foot positions)   Other Standing Exercises on blue mat on ramp, upfacing and down; head turns left/right and up/down x 10; EC x 30 sed             PT Short Term Goals - 02/14/17 1625      PT SHORT TERM GOAL #1   Title Pt will increase gait speed to 3 ft/sec w/o use of assisted device; level ground w/o loss of balance for 1000' w/ RPE  < 5/ 10    Time 4   Period Weeks   Status New   Target Date 03/17/17     PT SHORT TERM GOAL #2   Title Pt will be independent with HEP focused on large stepping mvt and progressive strengthening using the OTEGA routines.    Time 4   Period Weeks   Status New   Target Date 03/17/17           PT Long Term Goals - 02/14/17 1627      PT LONG TERM GOAL #1   Title Pt will demonstrate a low fall risk status , evident by scoring > 54 on BERG  > 20 on DGI and 4/4 on MOD CTSIB   Time 8   Period Weeks   Status New   Target Date 04/16/17     PT LONG TERM GOAL #2   Title Pt will be able to walk at 4 ft /sec x 5 min with supervision with RPE < 5/10    Time 8   Period Weeks   Status New   Target Date 04/16/17     PT LONG TERM GOAL #3   Title Pt will be able to independently perform a floor to stand transfer safely and independent of need of assistance or supervision. Would like to see him doing floor transfer as part of his HEP   Time 8   Period Weeks   Target Date 04/16/17               Plan - 03/07/17 1710    Clinical Impression Statement Session focused on strengthening and balance training. Patient did very well today with higher level challenges on compliant surfaces. Patient due for STG check next week and  will also look at progress towards LTGs  as he has made good progress.    Rehab Potential Excellent   Clinical Impairments Affecting Rehab Potential bilateral shoulder ROM deficits;    PT Frequency 2x / week   PT Duration 8 weeks   PT Treatment/Interventions Neuromuscular re-education;Balance training;Therapeutic exercise;Therapeutic activities;Functional mobility training;Stair training;Gait training;Patient/family education;Vestibular   PT Next Visit Plan begin to assess STGs (due by 11/5) and LTGs if approp (has made good progress); continue to update OTAGA HEP; balance training with ankle, hip and stepping strategies, compliant surfaces, etc   PT Home Exercise Plan OTAGA   Consulted and Agree with Plan of Care Patient      Patient will benefit from skilled therapeutic intervention in order to improve the following deficits and impairments:  Decreased activity tolerance, Decreased balance, Decreased coordination, Decreased range of motion, Decreased mobility, Decreased strength, Difficulty walking, Impaired flexibility  Visit Diagnosis: Muscle weakness (generalized)  Difficulty in walking, not elsewhere classified     Problem List Patient Active Problem List   Diagnosis Date Noted  . Rotator cuff tear, right 08/18/2016  . HLD (hyperlipidemia) 08/18/2016  . Nasal bone fracture 08/18/2016  . Syncope and collapse 08/08/2016  . SAH (subarachnoid hemorrhage) ([place]) 08/08/2016  . PCP NOTES >>>>>>>>>>>>>>>>>>>> 09/28/2015  . Vasomotor rhinitis 09/14/2014  . Cough 03/01/2014  . Right shoulder injury 03/15/2013  . Hiatal hernia 01/28/2013  . Erosive gastritis 01/28/2013  . Warfarin anticoagulation 01/28/2013  . Medicare annual wellness visit, subsequent 07/14/2012  . BPH (benign prostatic hyperplasia) 06/07/2009  . Osteopenia 06/07/2009  . Anemia due to chronic blood loss 11/25/2008  . Vitamin B 12 deficiency 06/01/2008  . HTN (hypertension) 06/01/2008  . PACEMAKER, PERMANENT  06/01/2008  . GERD 03/31/2007  . Hypothyroidism 01/26/2007  . DM (diabetes mellitus) with complications ([place]) 52/77/8242  . Permanent atrial fibrillation ([place]) 01/26/2007  . Dyslipidemia 10/30/2006  . Coronary artery disease involving native coronary artery of native heart without angina pectoris 10/02/2006  . HIP REPLACEMENT, RIGHT, HX OF 02/04/2003    Rexanne Mano, PT 03/07/2017, 5:13 PM  [facility] [address] [place], [place], [address] Phone: [phone]   Fax:  [phone]  Name: [name] MRN: [id number] Date of Birth: 1925/09/08

## 2017-03-11 ENCOUNTER — Encounter: Payer: Self-pay | Admitting: Internal Medicine

## 2017-03-11 ENCOUNTER — Ambulatory Visit (INDEPENDENT_AMBULATORY_CARE_PROVIDER_SITE_OTHER): Payer: Medicare HMO | Admitting: Internal Medicine

## 2017-03-11 VITALS — BP 124/78 | HR 78 | Temp 97.4°F | Resp 14 | Ht 67.0 in | Wt 142.4 lb

## 2017-03-11 DIAGNOSIS — Z09 Encounter for follow-up examination after completed treatment for conditions other than malignant neoplasm: Secondary | ICD-10-CM | POA: Diagnosis not present

## 2017-03-11 DIAGNOSIS — I10 Essential (primary) hypertension: Secondary | ICD-10-CM

## 2017-03-11 DIAGNOSIS — F329 Major depressive disorder, single episode, unspecified: Secondary | ICD-10-CM

## 2017-03-11 DIAGNOSIS — Z23 Encounter for immunization: Secondary | ICD-10-CM | POA: Diagnosis not present

## 2017-03-11 DIAGNOSIS — E875 Hyperkalemia: Secondary | ICD-10-CM

## 2017-03-11 DIAGNOSIS — I482 Chronic atrial fibrillation: Secondary | ICD-10-CM

## 2017-03-11 DIAGNOSIS — R634 Abnormal weight loss: Secondary | ICD-10-CM | POA: Diagnosis not present

## 2017-03-11 DIAGNOSIS — I4821 Permanent atrial fibrillation: Secondary | ICD-10-CM

## 2017-03-11 DIAGNOSIS — E118 Type 2 diabetes mellitus with unspecified complications: Secondary | ICD-10-CM | POA: Diagnosis not present

## 2017-03-11 DIAGNOSIS — E039 Hypothyroidism, unspecified: Secondary | ICD-10-CM

## 2017-03-11 LAB — BASIC METABOLIC PANEL
BUN: 26 mg/dL — ABNORMAL HIGH (ref 6–23)
CO2: 28 meq/L (ref 19–32)
Calcium: 10.1 mg/dL (ref 8.4–10.5)
Chloride: 97 mEq/L (ref 96–112)
Creatinine, Ser: 1.17 mg/dL (ref 0.40–1.50)
GFR: 62.39 mL/min (ref >60.00)
GLUCOSE: 312 mg/dL — AB (ref 70–99)
POTASSIUM: 5.8 meq/L — AB (ref 3.5–5.1)
Sodium: 131 mEq/L — ABNORMAL LOW (ref 135–145)

## 2017-03-11 LAB — MICROALBUMIN / CREATININE URINE RATIO
Creatinine,U: 274.9 mg/dL
MICROALB/CREAT RATIO: 2.4 mg/g (ref 0.0–30.0)
Microalb, Ur: 6.7 mg/dL — ABNORMAL HIGH (ref 0.0–1.9)

## 2017-03-11 LAB — CBC WITH DIFFERENTIAL/PLATELET
BASOS ABS: 0 10*3/uL (ref 0.0–0.1)
Basophils Relative: 0.7 % (ref 0.0–3.0)
EOS ABS: 0.1 10*3/uL (ref 0.0–0.7)
Eosinophils Relative: 1.3 % (ref 0.0–5.0)
HEMATOCRIT: 48.4 % (ref 39.0–52.0)
Hemoglobin: 16 g/dL (ref 13.0–17.0)
LYMPHS PCT: 15.3 % (ref 12.0–46.0)
Lymphs Abs: 1 10*3/uL (ref 0.7–4.0)
MCHC: 32.9 g/dL (ref 30.0–36.0)
MCV: 99 fl (ref 78.0–100.0)
MONOS PCT: 9 % (ref 3.0–12.0)
Monocytes Absolute: 0.6 10*3/uL (ref 0.1–1.0)
NEUTROS ABS: 4.8 10*3/uL (ref 1.4–7.7)
Neutrophils Relative %: 73.7 % (ref 43.0–77.0)
PLATELETS: 193 10*3/uL (ref 150.0–400.0)
RBC: 4.89 Mil/uL (ref 4.22–5.81)
RDW: 14 % (ref 11.5–15.5)
WBC: 6.6 10*3/uL (ref 4.0–10.5)

## 2017-03-11 LAB — HEMOGLOBIN A1C: Hgb A1c MFr Bld: 11.7 % — ABNORMAL HIGH (ref 4.6–6.5)

## 2017-03-11 LAB — TSH: TSH: 4.44 u[IU]/mL (ref 0.35–4.50)

## 2017-03-11 MED ORDER — PANTOPRAZOLE SODIUM 40 MG PO TBEC: 40.0000 mg | DELAYED_RELEASE_TABLET | Freq: Every day | ORAL | 3 refills | Status: DC

## 2017-03-11 MED ORDER — ESCITALOPRAM OXALATE 10 MG PO TABS: 10.0000 mg | ORAL_TABLET | Freq: Every day | ORAL | 0 refills | Status: DC

## 2017-03-11 NOTE — Progress Notes (Addendum)
Subjective:    Patient ID: [name], male    DOB: 1926/03/19, 83 y.o.   MRN: [id number]  DOS:  03/11/2017 Type of visit - description : rov here with his daughter Interval history: DM: Good compliance with medications, no recent  CBGs  I noted some weight loss, since April. I asked him about depression, he admits to some depression, related to missing his girlfriend for the last 2 months, she broke up with him. See review of systems  Frequent falls: Doing rehab , no further problems  Also reports hypersalivation for 2 years, states he is tired of it.  For the last 3 days he had a stomach pain, on the epigastric area, described as burning.  Decreased with Tums and Maalox.   Review of Systems Denies fever chills.  No nocturnal fevers No unusual aches or pains No unusual headaches Denies nausea or vomiting.  Had diarrhea for 1 day, I ask about below the change in the color of the stools and he could not tell, "I am color blind". No dysphasia or odynophagia. I asked about suicide, he was very hesitant but said that the idea has crossed his mind.   Past Medical History:  Diagnosis Date  . AAA (abdominal aortic aneurysm) ([place])   . Anemia due to chronic blood loss 11/25/2008   Recurrent over the years EGD and colonoscopy x 2 each 2004 and 2010 without cause    . Atrial fibrillation ([place])   . BPH (benign prostatic hyperplasia)    reports aprocedure (TURP) remotely in [place] KitchenNo futher f/u w/ urology  . CAD (coronary artery disease)   . Diabetes mellitus, type 2 ([place])   . Diverticulosis    left colon  . Erosive gastritis   . Fall 08/08/2016   OUTSIDE AT HOME FACIAL TRAUMA   . GERD (gastroesophageal reflux disease)   . Hyperlipidemia   . Hypertension   . Hypothyroidism   . Insomnia    transient  . Osteopenia    dexa 2-11  . Vitamin B12 deficiency     Past Surgical History:  Procedure Laterality Date  . CARDIAC CATHETERIZATION  01/31/06, 09/25/10, 09-2011  . CATARACT  EXTRACTION     right  . CHOLECYSTECTOMY, LAPAROSCOPIC    . COLONOSCOPY     multiple  . CORONARY ARTERY BYPASS GRAFT     1999 stents in 2000  . ESOPHAGOGASTRODUODENOSCOPY     multiple  . GIVENS CAPSULE STUDY N/A 11/04/2012   Procedure: GIVENS CAPSULE STUDY;  Surgeon: Gatha Mayer, MD;  Location: [facility] ENDOSCOPY;  Service: Endoscopy;  Laterality: N/A;  . inguinal herniorrhaphies     bilateral  . LEFT HEART CATHETERIZATION WITH CORONARY ANGIOGRAM N/A 10/04/2011   Procedure: LEFT HEART CATHETERIZATION WITH CORONARY ANGIOGRAM;  Surgeon: Burnell Blanks, MD;  Location: [facility] CATH LAB;  Service: Cardiovascular;  Laterality: N/A;  . pacemaker  [place], 2013   medtronic minix 8341  . PENILE PROSTHESIS IMPLANT  1992  . PERMANENT PACEMAKER GENERATOR CHANGE N/A 06/05/2011   Procedure: PERMANENT PACEMAKER GENERATOR CHANGE;  Surgeon: Evans Lance, MD;  Location: [facility] CATH LAB;  Service: Cardiovascular;  Laterality: N/A;  . PROSTATECTOMY     transurethral  . right hip replacement    . TRANSTHORACIC ECHOCARDIOGRAM  12/2006    Social History   Social History  . Marital status: Legally Separated    Spouse name: N/A  . Number of children: 5  . Years of education: N/A   Occupational History  .  retired Retired   Social History Main Topics  . Smoking status: Never Smoker  . Smokeless tobacco: Never Used  . Alcohol use 0.6 - 1.2 oz/week    1 - 2 Glasses of wine per week     Comment: socially   . Drug use: No  . Sexual activity: No   Other Topics Concern  . Not on file   Social History Narrative   Lives by self, independent on ADL, retired. Separated from wife.    Daughters:   [name] ( lives in [place]) [phone]   [name] ( lives out of town)  [phone]            Allergies as of 03/11/2017      Reactions   Ace Inhibitors Cough   Codeine Phosphate Nausea Only   Hydrochlorothiazide W-triamterene Other (See Comments)   CRAMPING      Medication List       Accurate as  of 03/11/17 11:59 PM. Always use your most recent med list.          acetaminophen 325 MG tablet Commonly known as:  TYLENOL Take 650 mg by mouth every 6 (six) hours as needed for mild pain or moderate pain.   amoxicillin 500 MG capsule Commonly known as:  AMOXIL Take 4 capsules (2,000 mg total) by mouth See admin instructions. ONE HOUR PRIOR TO EACH DENTAL APPOINTMENT   apixaban 5 MG Tabs tablet Commonly known as:  ELIQUIS Take 1 tablet (5 mg total) by mouth 2 (two) times daily.   digoxin 0.125 MG tablet Commonly known as:  LANOXIN Take 0.5 tablets (0.0625 mg total) by mouth daily.   escitalopram 10 MG tablet Commonly known as:  LEXAPRO Take 1 tablet (10 mg total) by mouth daily.   fenofibrate micronized 134 MG capsule Commonly known as:  LOFIBRA Take 1 capsule (134 mg total) by mouth daily.   insulin glargine 100 UNIT/ML injection Commonly known as:  LANTUS Inject 38 Units into the skin at bedtime. Pt reports taking every 2-3 days   Insulin Pen Needle 31G X 6 MM Misc To use w/ Lantus   latanoprost 0.005 % ophthalmic solution Commonly known as:  XALATAN Place 1 drop into both eyes at bedtime.   levothyroxine 88 MCG tablet Commonly known as:  SYNTHROID, LEVOTHROID Take 1 tablet (88 mcg total) by mouth daily before breakfast.   metoprolol tartrate 25 MG tablet Commonly known as:  LOPRESSOR Take 25 mg by mouth 2 (two) times daily.   mirtazapine 15 MG tablet Commonly known as:  REMERON Take 1 tablet (15 mg total) by mouth at bedtime.   nitroGLYCERIN 0.4 MG SL tablet Commonly known as:  NITROSTAT Place 1 tablet (0.4 mg total) under the tongue every 5 (five) minutes x 3 doses as needed for chest pain. Then contact 911 or go to ER   omeprazole 40 MG capsule Commonly known as:  PRILOSEC Take 1 capsule (40 mg total) by mouth daily.   pantoprazole 40 MG tablet Commonly known as:  PROTONIX Take 1 tablet (40 mg total) by mouth daily before breakfast.   pravastatin  40 MG tablet Commonly known as:  PRAVACHOL Take 2 tablets (80 mg total) by mouth daily.   sitaGLIPtin 100 MG tablet Commonly known as:  JANUVIA Take 1 tablet (100 mg total) by mouth daily.   triamcinolone ointment 0.1 % Commonly known as:  KENALOG   vitamin B-12 1000 MCG tablet Commonly known as:  CYANOCOBALAMIN Take 1 tablet (1,000  mcg total) by mouth daily.          Objective:   Physical Exam BP 124/78 (BP Location: Left Arm, Patient Position: Sitting, Cuff Size: Small)   Pulse 78   Temp (!) 97.4 F (36.3 C) (Oral)   Resp 14   Ht 5\' 7"  (1.702 m)   Wt 142 lb 6 oz (64.6 kg)   SpO2 98%   BMI 22.30 kg/m   General:   Well developed, well nourished . NAD.  HEENT:  Normocephalic . Face symmetric, atraumatic Lungs:  CTA B Normal respiratory effort, no intercostal retractions, no accessory muscle use. Heart: RRR,  no murmur.  no pretibial edema bilaterally  Abdomen:  Not distended, soft, non-tender. No rebound or rigidity. DIABETIC FEET EXAM: No lower extremity edema Normal pedal pulses bilaterally Skin normal, nails normal, no calluses Pinprick examination : 2 areas with decreased sensitivity Skin: Not pale. Not jaundice Neurologic:  alert & oriented X3.  Speech normal, gait appropriate for age and unassisted Psych--  Cognition and judgment appear intact.  Cooperative with normal attention span and concentration.  Behavior appropriate. Tearful     Assessment & Plan:  Assessment DM, + neuropahty per exam 12-2015 CRI Creat ~ 1.3-1.4 HTN Hyperlipidemia Hypothyroidism CV: --CAD -- Dr Harrington Challenger --Atrial fibrillation, PPM --- Dr Lovena Le --AAA: [place] 06-2014 stable, was rec no further imagine  GI: Erosive gastritis, GERD, h/o anemia d/t  chronic blood loss BPH NEURO:  07-2016: Fall, vaso vagal syncope? : SAH, eld asa-warfarin Osteopenia- declined dexa 2015 B 12 deficiency  Plan: DM: Reports good compliance with Januvia, insulin.  No ambulatory CBGs.  Foot exam with  some neuropathy.  Last A1c 10.5.  Recheck  micro today.   HTN: Continue with Lopressor, check a BMP Hypothyroidism: On Synthroid, check a TSH Weight loss: He used to be in the 170s, pounds, gradually decrease in weight since April, current weight 142 pounds.  ROS (-) for fevers, headaches, night sweats.  He does have poorly controlled DM and depression which may account for wt loss.  Reassess in 6 weeks  Depression: His g-friend broke up with him a couple months ago, no doing well emotionally, admits to some suicidal ideas, admits to having guns at home.  I discussed with him and his daughter the risk of suicide, the daughter visits him daily;  patient would declined a removal of guns from his property.  I asked the patient in front of her daughter if is safe to let him go home and he said yes. Knows counseling is a good treatment option, eventually we agreed to start medication.  Will recommend Lexapro, return to clinic in 6 weeks.  I reinforced the risk of suicide before the left, I asked the daughter if she would keep a close eye on him, she said yes.. Abdominal pain: See above, abdominal pain for 3 days, exam is benign, he also has excessive saliva for 2 years.  Not taking Motrin. Will prescribe pantoprazole. RTC 6 weeks  Today, I spent more than 42 min with the patient: >50% of the time counseling regards depression, risk of suicide , listening to his concerns, talking about gun removal from his property

## 2017-03-11 NOTE — Progress Notes (Signed)
Pre visit review using our clinic review tool, if applicable. No additional management support is needed unless otherwise documented below in the visit note. 

## 2017-03-11 NOTE — Patient Instructions (Addendum)
GO TO THE LAB : Get the blood work     GO TO THE FRONT DESK Schedule your next appointment for a checkup in 6 weeks    Start Lexapro 10 mg 1 tablet at bedtime  Watch for excessive somnolence  Watch for suicidal ideas 1 800 [phone]  Pantoprazole 1 before breakfast for  stomach pain.  If you are not better let me know.

## 2017-03-12 DIAGNOSIS — F32A Depression, unspecified: Secondary | ICD-10-CM | POA: Insufficient documentation

## 2017-03-12 DIAGNOSIS — F329 Major depressive disorder, single episode, unspecified: Secondary | ICD-10-CM | POA: Insufficient documentation

## 2017-03-12 NOTE — Addendum Note (Signed)
Addended by: Damita Dunnings D on: 03/12/2017 12:33 PM   Modules accepted: Orders

## 2017-03-12 NOTE — Assessment & Plan Note (Addendum)
DM: Reports good compliance with Januvia, insulin.  No ambulatory CBGs.  Foot exam with some neuropathy.  Last A1c 10.5.  Recheck  micro today.   HTN: Continue with Lopressor, check a BMP Hypothyroidism: On Synthroid, check a TSH Weight loss: He used to be in the 170s, pounds, gradually decrease in weight since April, current weight 142 pounds.  ROS (-) for fevers, headaches, night sweats.  He does have poorly controlled DM and depression which may account for wt loss.  Reassess in 6 weeks  Depression: His g-friend broke up with him a couple months ago, no doing well emotionally, admits to some suicidal ideas, admits to having guns at home.  I discussed with him and his daughter the risk of suicide, the daughter visits him daily;  patient would declined a removal of guns from his property.  I asked the patient in front of her daughter if is safe to let him go home and he said yes. Knows counseling is a good treatment option, eventually we agreed to start medication.  Will recommend Lexapro, return to clinic in 6 weeks.  I reinforced the risk of suicide before the left, I asked the daughter if she would keep a close eye on him, she said yes.. Abdominal pain: See above, abdominal pain for 3 days, exam is benign, he also has excessive saliva for 2 years.  Not taking Motrin. Will prescribe pantoprazole. RTC 6 weeks

## 2017-03-13 ENCOUNTER — Encounter: Payer: Self-pay | Admitting: Physical Therapy

## 2017-03-13 ENCOUNTER — Ambulatory Visit (INDEPENDENT_AMBULATORY_CARE_PROVIDER_SITE_OTHER): Payer: Medicare HMO | Admitting: Family Medicine

## 2017-03-13 ENCOUNTER — Ambulatory Visit: Payer: Medicare HMO | Admitting: Physical Therapy

## 2017-03-13 ENCOUNTER — Encounter: Payer: Self-pay | Admitting: Family Medicine

## 2017-03-13 ENCOUNTER — Other Ambulatory Visit (INDEPENDENT_AMBULATORY_CARE_PROVIDER_SITE_OTHER): Payer: Medicare HMO

## 2017-03-13 VITALS — BP 110/70 | HR 85 | Temp 97.9°F | Ht 67.0 in | Wt 143.1 lb

## 2017-03-13 DIAGNOSIS — E875 Hyperkalemia: Secondary | ICD-10-CM

## 2017-03-13 DIAGNOSIS — S60922A Unspecified superficial injury of left hand, initial encounter: Secondary | ICD-10-CM | POA: Diagnosis not present

## 2017-03-13 DIAGNOSIS — S6992XA Unspecified injury of left wrist, hand and finger(s), initial encounter: Secondary | ICD-10-CM | POA: Diagnosis not present

## 2017-03-13 DIAGNOSIS — I482 Chronic atrial fibrillation: Secondary | ICD-10-CM | POA: Diagnosis not present

## 2017-03-13 DIAGNOSIS — R109 Unspecified abdominal pain: Secondary | ICD-10-CM | POA: Diagnosis not present

## 2017-03-13 DIAGNOSIS — I4821 Permanent atrial fibrillation: Secondary | ICD-10-CM

## 2017-03-13 DIAGNOSIS — Z23 Encounter for immunization: Secondary | ICD-10-CM | POA: Diagnosis not present

## 2017-03-13 LAB — BASIC METABOLIC PANEL
BUN: 26 mg/dL — ABNORMAL HIGH (ref 6–23)
CHLORIDE: 99 meq/L (ref 96–112)
CO2: 30 meq/L (ref 19–32)
Calcium: 9 mg/dL (ref 8.4–10.5)
Creatinine, Ser: 1.29 mg/dL (ref 0.40–1.50)
GFR: 55.74 mL/min — ABNORMAL LOW (ref >60.00)
Glucose, Bld: 157 mg/dL — ABNORMAL HIGH (ref 70–99)
POTASSIUM: 4.9 meq/L (ref 3.5–5.1)
SODIUM: 134 meq/L — AB (ref 135–145)

## 2017-03-13 NOTE — Therapy (Signed)
[facility] [address] [place], [place], [address] Phone: [phone]   Fax:  [phone]  Patient Details  Name: [name] MRN: [id number] Date of Birth: 08-06-1925 Referring Provider:  No ref. provider found  Encounter Date: 03/13/2017  Patient called clinic and cancelled his PT appointments for 11/1 and 11/2 telling the front office that he fell.   Called patient and he reports he fell this morning. He was standing peeling an apple and suddenly his legs "felt strange." He began walking to his bedroom and his legs "were not working right." He fell (denies blacking out) and hit his left side/chest on his bedside commode. He was able to get off the floor onto his recliner ("it was hard because my legs weren't working right") and reports within a few minutes his legs were back to normal.   He did notify his daughter and has an appointment with his PCP today. His daughter is going to drive him. Encouraged patient to call 911 and go to the emergency room. He does not want to do that.    Informed patient we are not cancelling his appointments for next week and to please call and let us know how he is doing and if he will be able to resume PT.    Rexanne Mano, PT 03/13/2017, 11:42 AM  [facility] [address] [place], [place], [address] Phone: [phone]   Fax:  [phone]

## 2017-03-13 NOTE — Progress Notes (Signed)
Chief Complaint  [name] presents with  . Hand Injury    left hand    Subjective: [name] is a 83 y.o. male here for L hand injury. Here w daughter.  Pt lost balance and ran into a piece of furniture earlier in the day. He put alcohol over the area and applied tape to keep the area on his hand together. He also landed on his L side. That is mildly sore. He did not hit his head or loose consciousness.   ROS: Skin: +tear in skin on L hand  Family History  Problem Relation Age of Onset  . Alzheimer's disease Mother   . Heart failure Father   . CVA Father   . Coronary artery disease Brother        cabg  . Alzheimer's disease Sister   . Mental illness Sister   . Alzheimer's disease Sister   . Other Sister        lung problems  . Diabetes Other        several siblings  . Prostate cancer Neg Hx   . Colon cancer Neg Hx   . Esophageal cancer Neg Hx   . Stomach cancer Neg Hx   . Rectal cancer Neg Hx    Past Medical History:  Diagnosis Date  . AAA (abdominal aortic aneurysm) ([place])   . Anemia due to chronic blood loss 11/25/2008   Recurrent over the years EGD and colonoscopy x 2 each 2004 and 2010 without cause    . Atrial fibrillation ([place])   . BPH (benign prostatic hyperplasia)    reports aprocedure (TURP) remotely in [place] KitchenNo futher f/u w/ urology  . CAD (coronary artery disease)   . Diabetes mellitus, type 2 ([place])   . Diverticulosis    left colon  . Erosive gastritis   . Fall 08/08/2016   OUTSIDE AT HOME FACIAL TRAUMA   . GERD (gastroesophageal reflux disease)   . Hyperlipidemia   . Hypertension   . Hypothyroidism   . Insomnia    transient  . Osteopenia    dexa 2-11  . Vitamin B12 deficiency    Allergies  Allergen Reactions  . Ace Inhibitors Cough  . Codeine Phosphate Nausea Only  . Hydrochlorothiazide W-Triamterene Other (See Comments)    CRAMPING    Current Outpatient Prescriptions:  .  acetaminophen (TYLENOL) 325 MG tablet, Take 650 mg by mouth every 6  (six) hours as needed for mild pain or moderate pain. , Disp: , Rfl:  .  amoxicillin (AMOXIL) 500 MG capsule, Take 4 capsules (2,000 mg total) by mouth See admin instructions. ONE HOUR PRIOR TO [facility] DENTAL APPOINTMENT, Disp: 10 capsule, Rfl: 0 .  apixaban (ELIQUIS) 5 MG TABS tablet, Take 1 tablet (5 mg total) by mouth 2 (two) times daily., Disp: 180 tablet, Rfl: 0 .  digoxin (LANOXIN) 0.125 MG tablet, Take 0.5 tablets (0.0625 mg total) by mouth daily., Disp: 45 tablet, Rfl: 3 .  escitalopram (LEXAPRO) 10 MG tablet, Take 1 tablet (10 mg total) by mouth daily., Disp: 90 tablet, Rfl: 0 .  fenofibrate micronized (LOFIBRA) 134 MG capsule, Take 1 capsule (134 mg total) by mouth daily., Disp: 90 capsule, Rfl: 2 .  insulin glargine (LANTUS) 100 UNIT/ML injection, Inject 50 Units into the skin at bedtime., Disp: , Rfl:  .  Insulin Pen Needle 31G X 6 MM MISC, To use w/ Lantus, Disp: 100 each, Rfl: 12 .  latanoprost (XALATAN) 0.005 % ophthalmic solution, Place 1 drop into both eyes  at bedtime., Disp: , Rfl:  .  levothyroxine (SYNTHROID, LEVOTHROID) 88 MCG tablet, Take 1 tablet (88 mcg total) by mouth daily before breakfast., Disp: 90 tablet, Rfl: 2 .  metoprolol tartrate (LOPRESSOR) 25 MG tablet, Take 25 mg by mouth 2 (two) times daily. , Disp: , Rfl:  .  mirtazapine (REMERON) 15 MG tablet, Take 1 tablet (15 mg total) by mouth at bedtime., Disp: 90 tablet, Rfl: 1 .  nitroGLYCERIN (NITROSTAT) 0.4 MG SL tablet, Place 1 tablet (0.4 mg total) under the tongue every 5 (five) minutes x 3 doses as needed for chest pain. Then contact 911 or go to ER, Disp: 25 tablet, Rfl: 3 .  omeprazole (PRILOSEC) 40 MG capsule, Take 1 capsule (40 mg total) by mouth daily., Disp: 90 capsule, Rfl: 0 .  pantoprazole (PROTONIX) 40 MG tablet, Take 1 tablet (40 mg total) by mouth daily before breakfast., Disp: 90 tablet, Rfl: 3 .  pravastatin (PRAVACHOL) 40 MG tablet, Take 2 tablets (80 mg total) by mouth daily., Disp: 180 tablet, Rfl: 1 .   sitaGLIPtin (JANUVIA) 100 MG tablet, Take 1 tablet (100 mg total) by mouth daily., Disp: 90 tablet, Rfl: 1 .  triamcinolone ointment (KENALOG) 0.1 %, , Disp: , Rfl:  .  vitamin B-12 (CYANOCOBALAMIN) 1000 MCG tablet, Take 1 tablet (1,000 mcg total) by mouth daily., Disp: 90 tablet, Rfl: 2  Objective: BP 110/70 (BP Location: Right Arm, [name] Position: Sitting, Cuff Size: Normal)   Pulse 85   Temp 97.9 F (36.6 C) (Oral)   Ht 5\' 7"  (1.702 m)   Wt 143 lb 2 oz (64.9 kg)   SpO2 98%   BMI 22.42 kg/m  General: Awake, appears stated age Heart: Brisk cap refill Lungs: No accessory muscle use Skin: There is a curved lac along the dorsal portion of L hand 2.5 cm in length over 3rd and 4th MC distally, ecchymosis noted, it approximates well, very superficial. There is another area that was gouged out laterally.  MSK: mild ecchymosis over L abd wall, very mild TTP over L sided ribs, no crepitus or deformity Psych: Age appropriate judgment and insight, normal affect and mood  Assessment and Plan: Injury of left hand, initial encounter  Side pain  Need for Td vaccine - Plan: Td vaccine greater than or equal to 7yo preservative free IM  Wound care given. Too superficial for sutures, we do not have glue. Keep area c/d. TAO. Warning s/s's discussed and written down. Ice. Tylenol. F/u prn.  The [name] and his daughter voiced understanding and agreement to the plan.  [place], DO 03/13/17  2:52 PM

## 2017-03-13 NOTE — Progress Notes (Signed)
Pre visit review using our clinic review tool, if applicable. No additional management support is needed unless otherwise documented below in the visit note. 

## 2017-03-13 NOTE — Patient Instructions (Signed)
Ice/cold pack over area for 10-15 min every 2-3 hours while awake.  OK to take Tylenol 1000 mg (2 extra strength tabs) or 975 mg (3 regular strength tabs) every 6 hours as needed.  Things to look out for: foul odor, pain not controlled with above, drainage, fevers, streaking redness.  Let us know if you need anything.

## 2017-03-14 ENCOUNTER — Ambulatory Visit: Payer: Medicare HMO | Admitting: Physical Therapy

## 2017-03-14 LAB — DIGOXIN LEVEL

## 2017-03-18 ENCOUNTER — Encounter: Payer: Self-pay | Admitting: Physical Therapy

## 2017-03-18 ENCOUNTER — Ambulatory Visit: Payer: Medicare HMO | Attending: Internal Medicine | Admitting: Physical Therapy

## 2017-03-18 DIAGNOSIS — M6281 Muscle weakness (generalized): Secondary | ICD-10-CM | POA: Diagnosis not present

## 2017-03-18 DIAGNOSIS — R262 Difficulty in walking, not elsewhere classified: Secondary | ICD-10-CM | POA: Diagnosis not present

## 2017-03-19 NOTE — Therapy (Signed)
[facility] [address] [place], [place], [address] Phone: [phone]   Fax:  [phone]  Physical Therapy Treatment  Patient Details  Name: [name] MRN: [id number] Date of Birth: 06/17/1925 Referring Provider: Dorris Carnes MD   Encounter Date: 03/18/2017  PT End of Session - 03/19/17 1257    Visit Number  6    Number of Visits  17    Date for PT Re-Evaluation  04/16/17 11/07 updated to match certification dates   11/07 updated to match certification dates   PT Start Time  1404    PT Stop Time  1444    PT Time Calculation (min)  40 min    Equipment Utilized During Treatment  -- used pt's leather belt   used pt's leather belt   Activity Tolerance  Patient tolerated treatment well    Behavior During Therapy  WFL for tasks assessed/performed       Past Medical History:  Diagnosis Date  . AAA (abdominal aortic aneurysm) ([place])   . Anemia due to chronic blood loss 11/25/2008   Recurrent over the years EGD and colonoscopy x 2 each 2004 and 2010 without cause    . Atrial fibrillation ([place])   . BPH (benign prostatic hyperplasia)    reports aprocedure (TURP) remotely in [place] KitchenNo futher f/u w/ urology  . CAD (coronary artery disease)   . Diabetes mellitus, type 2 ([place])   . Diverticulosis    left colon  . Erosive gastritis   . Fall 08/08/2016   OUTSIDE AT HOME FACIAL TRAUMA   . GERD (gastroesophageal reflux disease)   . Hyperlipidemia   . Hypertension   . Hypothyroidism   . Insomnia    transient  . Osteopenia    dexa 2-11  . Vitamin B12 deficiency     Past Surgical History:  Procedure Laterality Date  . CARDIAC CATHETERIZATION  01/31/06, 09/25/10, 09-2011  . CATARACT EXTRACTION     right  . CHOLECYSTECTOMY, LAPAROSCOPIC    . COLONOSCOPY     multiple  . CORONARY ARTERY BYPASS GRAFT     1999 stents in 2000  . ESOPHAGOGASTRODUODENOSCOPY     multiple  . inguinal herniorrhaphies     bilateral  . pacemaker   1980, 1995, 2013   medtronic minix 8341  . PENILE PROSTHESIS IMPLANT  1992  . PROSTATECTOMY     transurethral  . right hip replacement    . TRANSTHORACIC ECHOCARDIOGRAM  12/2006    There were no vitals filed for this visit.  Subjective Assessment - 03/18/17 1403    Subjective  Legs just don't feel right since "they just stopped working" and I fell. (See previous note for specifics)    Pertinent History  Pacemaker, DM ; AAA; afib; cad; HTN; ostopenia;     Limitations  Standing;Walking    How long can you sit comfortably?  no problem    How long can you stand comfortably?  5 min    How long can you walk comfortably?  5 min    Currently in Pain?  No/denies denies pain in side/chest from fall   denies pain in side/chest from fall                     [facility] Adult PT Treatment/Exercise - 03/19/17 0001      Ambulation/Gait   Ambulation/Gait Assistance  6: Modified independent (Device/Increase time) decr velocity   decr velocity   Ambulation Distance (Feet)  1000  Feet    Assistive device  None    Gait Pattern  Step-through pattern;Decreased arm swing - right;Decreased arm swing - left;Decreased trunk rotation;Poor foot clearance - left;Poor foot clearance - right    Ambulation Surface  Indoor    Gait velocity  1000 ft/371.49sec=2.69 ft/sec    Gait Comments  RPE before and after 1000 ft ambulation 11 on 20 point scale          Balance Exercises - 03/18/17 1422      OTAGO PROGRAM   Knee Extensor  5 reps    Hip ABductor  20 reps    Ankle Plantorflexors  20 reps, support    Ankle Dorsiflexors  20 reps, support    Backwards Walking  Support    Sideways Walking  Assistive device    One Leg Stand  10 seconds, support    Heel Walking  Support    Toe Walk  Support          PT Short Term Goals - 03/19/17 0752      PT SHORT TERM GOAL #1   Title  Pt will increase gait speed to 3 ft/sec w/o use of assisted device; level ground w/o loss of balance for 1000' w/ RPE   < 5/ 10     Baseline  11/06 1000 ft in 6:11.49 = 2.69 ft/sec RPE remained 11 (<5 point change)    Time  4    Period  Weeks    Status  Partially Met      PT SHORT TERM GOAL #2   Title  Pt will be independent with HEP focused on large stepping mvt and progressive strengthening using the OTEGA routines.     Baseline  11/6 Pt required instructional cues for 2 of his home exercises, otherwise completing correctly    Time  4    Period  Weeks    Status  Partially Met        PT Long Term Goals - 02/14/17 1627      PT LONG TERM GOAL #1   Title  Pt will demonstrate a low fall risk status , evident by scoring > 54 on BERG  > 20 on DGI and 4/4 on MOD CTSIB    Time  8    Period  Weeks    Status  New    Target Date  04/16/17      PT LONG TERM GOAL #2   Title  Pt will be able to walk at 4 ft /sec x 5 min with supervision with RPE < 5/10     Time  8    Period  Weeks    Status  New    Target Date  04/16/17      PT LONG TERM GOAL #3   Title  Pt will be able to independently perform a floor to stand transfer safely and independent of need of assistance or supervision. Would like to see him doing floor transfer as part of his HEP    Time  8    Period  Weeks    Target Date  04/16/17            Plan - 03/19/17 1259    Clinical Impression Statement  Patient had not been feeling well and then fell at home. First PT visit since that time and pt appeared generally weaker and less steady today. Time for STG check with pt partially meeting 2 of 2 STGs (made progress towards both, however not  to goal level). Patient can continue to benefit from PT to work towards [place] and further reduce his fall risk.    Rehab Potential  Excellent    Clinical Impairments Affecting Rehab Potential  bilateral shoulder ROM deficits;     PT Frequency  2x / week    PT Duration  8 weeks    PT Treatment/Interventions  Neuromuscular re-education;Balance training;Therapeutic exercise;Therapeutic activities;Functional  mobility training;Stair training;Gait training;Patient/family education;Vestibular    PT Next Visit Plan  check his technique for OTAGA hip abdct and toe raises (I think I changed to single toe raise and alternating instead of together--check notebook!)  balance training with ankle, hip and stepping strategies, compliant surfaces, etc    PT Home Exercise Plan  OTAGA    Consulted and Agree with Plan of Care  Patient       Patient will benefit from skilled therapeutic intervention in order to improve the following deficits and impairments:  Decreased activity tolerance, Decreased balance, Decreased coordination, Decreased range of motion, Decreased mobility, Decreased strength, Difficulty walking, Impaired flexibility  Visit Diagnosis: Muscle weakness (generalized)  Difficulty in walking, not elsewhere classified     Problem List Patient Active Problem List   Diagnosis Date Noted  . Depression 03/12/2017  . Rotator cuff tear, right 08/18/2016  . HLD (hyperlipidemia) 08/18/2016  . Nasal bone fracture 08/18/2016  . Syncope and collapse 08/08/2016  . SAH (subarachnoid hemorrhage) ([place]) 08/08/2016  . PCP NOTES >>>>>>>>>>>>>>>>>>>> 09/28/2015  . Vasomotor rhinitis 09/14/2014  . Cough 03/01/2014  . Right shoulder injury 03/15/2013  . Hiatal hernia 01/28/2013  . Erosive gastritis 01/28/2013  . Warfarin anticoagulation 01/28/2013  . Medicare annual wellness visit, subsequent 07/14/2012  . BPH (benign prostatic hyperplasia) 06/07/2009  . Osteopenia 06/07/2009  . Anemia due to chronic blood loss 11/25/2008  . Vitamin B 12 deficiency 06/01/2008  . HTN (hypertension) 06/01/2008  . PACEMAKER, PERMANENT 06/01/2008  . GERD 03/31/2007  . Hypothyroidism 01/26/2007  . DM (diabetes mellitus) with complications ([place]) 96/28/3662  . Permanent atrial fibrillation ([place]) 01/26/2007  . Dyslipidemia 10/30/2006  . Coronary artery disease involving native coronary artery of native heart without angina  pectoris 10/02/2006  . HIP REPLACEMENT, RIGHT, HX OF 02/04/2003    Rexanne Mano, PT 03/19/2017, 1:22 PM  [facility] [address] [place], [place], [address] Phone: [phone]   Fax:  [phone]  Name: [name] MRN: [id number] Date of Birth: Dec 11, 1925

## 2017-03-20 ENCOUNTER — Ambulatory Visit: Payer: Medicare HMO | Admitting: Physical Therapy

## 2017-03-20 ENCOUNTER — Encounter: Payer: Self-pay | Admitting: Physical Therapy

## 2017-03-20 DIAGNOSIS — R262 Difficulty in walking, not elsewhere classified: Secondary | ICD-10-CM | POA: Diagnosis not present

## 2017-03-20 DIAGNOSIS — M6281 Muscle weakness (generalized): Secondary | ICD-10-CM

## 2017-03-20 NOTE — Therapy (Signed)
[facility] [address] [place], [place], [address] Phone: [phone]   Fax:  [phone]  Physical Therapy Treatment  Patient Details  Name: [name] MRN: [id number] Date of Birth: 07/26/1925 Referring Provider: Dorris Carnes MD   Encounter Date: 03/20/2017  PT End of Session - 03/20/17 1734    Visit Number  7    Number of Visits  17    Date for PT Re-Evaluation  04/16/17 11/07 updated to match certification dates    PT Start Time  2505    PT Stop Time  1528    PT Time Calculation (min)  41 min    Equipment Utilized During Treatment  Gait belt used pt's leather belt    Activity Tolerance  Patient tolerated treatment well    Behavior During Therapy  [facility] for tasks assessed/performed       Past Medical History:  Diagnosis Date  . AAA (abdominal aortic aneurysm) ([place])   . Anemia due to chronic blood loss 11/25/2008   Recurrent over the years EGD and colonoscopy x 2 each 2004 and 2010 without cause    . Atrial fibrillation ([place] [place])   . BPH (benign prostatic hyperplasia)    reports aprocedure (TURP) remotely in [place].Mar[place] KitchenNo futher f/u w/ urology  . CAD (coronary artery disease)   . Diabetes mellitus, type 2 ([place])   . Diverticulosis    left colon  . Erosive gastritis   . Fall 08/08/2016   OUTSIDE AT HOME FACIAL TRAUMA   . GERD (gastroesophageal reflux disease)   . Hyperlipidemia   . Hypertension   . Hypothyroidism   . Insomnia    transient  . Osteopenia    dexa 2-11  . Vitamin B12 deficiency     Past Surgical History:  Procedure Laterality Date  . CARDIAC CATHETERIZATION  01/31/06, 09/25/10, 09-2011  . CATARACT EXTRACTION     right  . CHOLECYSTECTOMY, LAPAROSCOPIC    . COLONOSCOPY     multiple  . CORONARY ARTERY BYPASS GRAFT     1999 stents in 2000  . ESOPHAGOGASTRODUODENOSCOPY     multiple  . inguinal herniorrhaphies     bilateral  . pacemaker  1980, 1995, 2013   medtronic minix 8341  . PENILE  PROSTHESIS IMPLANT  1992  . PROSTATECTOMY     transurethral  . right hip replacement    . TRANSTHORACIC ECHOCARDIOGRAM  12/2006    There were no vitals filed for this visit.  Subjective Assessment - 03/20/17 1447    Subjective  Feels OK. Able to do his exercises yesterday and did well.   (Pended)     Pertinent History  Pacemaker, DM ; AAA; afib; cad; HTN; ostopenia;   (Pended)     Limitations  Standing;Walking  (Pended)     How long can you sit comfortably?  no problem  (Pended)     How long can you stand comfortably?  5 min  (Pended)     How long can you walk comfortably?  5 min  (Pended)     Currently in Pain?  No/denies  (Pended)                       [facility] Adult PT Treatment/Exercise - 03/20/17 0001      Exercises   Exercises  Other Exercises    Other Exercises   seated chin tuck with scapular squeeze x 15 reps          Balance  Exercises - 03/20/17 1519      Balance Exercises: Standing   Standing Eyes Closed  Narrow base of support (BOS);Foam/compliant surface;Head turns alternate shoulder flex or abdct (ROM very limited)    Tandem Stance  Eyes open;Upper extremity support 2;20 secs    SLS  Eyes open;Upper extremity support 1;Solid surface;3 reps;15 secs    SLS with Vectors  Foam/compliant surface cone single, double taps; tip cone over and up    Rockerboard  Anterior/posterior;30 seconds;5 reps ant-post tipping onlarge board    Step Ups  Forward;4 inch;UE support 1 with free leg abducting    Step Over Hurdles / Cones  blue mat // bars light UE support; tall hurdles side ways, forward, backward       OTAGO PROGRAM   Neck Movements  Sitting 10 w/ scapular squeeze    Knee Flexor  20 reps    Hip ABductor  10 reps    Sideways Walking  Assistive device    One Leg Stand  10 seconds, support        PT Education - 03/20/17 1732    Education provided  Yes    Education Details  again reviewed 2 exercises from his OTAGA HEP that he had difficulty with (hip  abdct, alternating standing DF) and he again needed cues/instructions. Reminded him he has to look at the written instructions, not just the picture.     Person(s) Educated  Patient    Methods  Explanation;Demonstration;Verbal cues;Handout    Comprehension  Verbalized understanding;Returned demonstration;Verbal cues required;Need further instruction       PT Short Term Goals - 03/19/17 0752      PT SHORT TERM GOAL #1   Title  Pt will increase gait speed to 3 ft/sec w/o use of assisted device; level ground w/o loss of balance for 1000' w/ RPE  < 5/ 10     Baseline  11/06 1000 ft in 6:11.49 = 2.69 ft/sec RPE remained 11 (<5 point change)    Time  4    Period  Weeks    Status  Partially Met      PT SHORT TERM GOAL #2   Title  Pt will be independent with HEP focused on large stepping mvt and progressive strengthening using the OTEGA routines.     Baseline  11/6 Pt required instructional cues for 2 of his home exercises, otherwise completing correctly    Time  4    Period  Weeks    Status  Partially Met        PT Long Term Goals - 02/14/17 1627      PT LONG TERM GOAL #1   Title  Pt will demonstrate a low fall risk status , evident by scoring > 54 on BERG  > 20 on DGI and 4/4 on MOD CTSIB    Time  8    Period  Weeks    Status  New    Target Date  04/16/17      PT LONG TERM GOAL #2   Title  Pt will be able to walk at 4 ft /sec x 5 min with supervision with RPE < 5/10     Time  8    Period  Weeks    Status  New    Target Date  04/16/17      PT LONG TERM GOAL #3   Title  Pt will be able to independently perform a floor to stand transfer safely and independent of need of  assistance or supervision. Would like to see him doing floor transfer as part of his HEP    Time  8    Period  Weeks    Target Date  04/16/17            Plan - 03/20/17 1735    Clinical Impression Statement  Session focused on balance traiining, including reviewing 2 exercises from his OTAGO HEP that he  required cues to correctly perform last visit. He again today required cues, noting he "didn't read the words. I just looked at the picture." Overall he did very well with maintaining or regaining his balance during challenging activities. Anticipate if he remains healthy that he will not need all his visits through 12/05    Rehab Potential  Excellent    Clinical Impairments Affecting Rehab Potential  bilateral shoulder ROM deficits;     PT Frequency  2x / week    PT Duration  8 weeks    PT Treatment/Interventions  Neuromuscular re-education;Balance training;Therapeutic exercise;Therapeutic activities;Functional mobility training;Stair training;Gait training;Patient/family education;Vestibular    PT Next Visit Plan  check his technique for OTAGA hip abdct and toe raises (I changed to single toe raise and alternating instead of together--check notebook!)  balance training with ankle, hip and stepping strategies, compliant surfaces, etc    PT Home Exercise Plan  OTAGO    Consulted and Agree with Plan of Care  Patient       Patient will benefit from skilled therapeutic intervention in order to improve the following deficits and impairments:  Decreased activity tolerance, Decreased balance, Decreased coordination, Decreased range of motion, Decreased mobility, Decreased strength, Difficulty walking, Impaired flexibility  Visit Diagnosis: Muscle weakness (generalized)  Difficulty in walking, not elsewhere classified     Problem List Patient Active Problem List   Diagnosis Date Noted  . Depression 03/12/2017  . Rotator cuff tear, right 08/18/2016  . HLD (hyperlipidemia) 08/18/2016  . Nasal bone fracture 08/18/2016  . Syncope and collapse 08/08/2016  . SAH (subarachnoid hemorrhage) ([place]) 08/08/2016  . PCP NOTES >>>>>>>>>>>>>>>>>>>> 09/28/2015  . Vasomotor rhinitis 09/14/2014  . Cough 03/01/2014  . Right shoulder injury 03/15/2013  . Hiatal hernia 01/28/2013  . Erosive gastritis  01/28/2013  . Warfarin anticoagulation 01/28/2013  . Medicare annual wellness visit, subsequent 07/14/2012  . BPH (benign prostatic hyperplasia) 06/07/2009  . Osteopenia 06/07/2009  . Anemia due to chronic blood loss 11/25/2008  . Vitamin B 12 deficiency 06/01/2008  . HTN (hypertension) 06/01/2008  . PACEMAKER, PERMANENT 06/01/2008  . GERD 03/31/2007  . Hypothyroidism 01/26/2007  . DM (diabetes mellitus) with complications ([place]) 87/27/6184  . Permanent atrial fibrillation ([place]) 01/26/2007  . Dyslipidemia 10/30/2006  . Coronary artery disease involving native coronary artery of native heart without angina pectoris 10/02/2006  . HIP REPLACEMENT, RIGHT, HX OF 02/04/2003    Rexanne Mano, PT 03/20/2017, 5:39 PM  [facility] [address] [place], [place], [address] Phone: [phone]   Fax:  [phone]  Name: [name] MRN: [id number] Date of Birth: 1926/03/19

## 2017-03-25 ENCOUNTER — Ambulatory Visit: Payer: Medicare HMO | Admitting: Physical Therapy

## 2017-03-25 ENCOUNTER — Encounter: Payer: Self-pay | Admitting: Physical Therapy

## 2017-03-25 DIAGNOSIS — M6281 Muscle weakness (generalized): Secondary | ICD-10-CM

## 2017-03-25 DIAGNOSIS — R262 Difficulty in walking, not elsewhere classified: Secondary | ICD-10-CM

## 2017-03-25 NOTE — Therapy (Signed)
[facility] [address] [place], [place], [address] Phone: [phone]   Fax:  [phone]  Physical Therapy Treatment  Patient Details  Name: [name] MRN: [id number] Date of Birth: 1925/06/17 Referring Provider: Dorris Carnes MD   Encounter Date: 03/25/2017  PT End of Session - 03/25/17 1453    Visit Number  8    Number of Visits  17    Date for PT Re-Evaluation  04/16/17    PT Start Time  9201    PT Stop Time  1444    PT Time Calculation (min)  40 min       Past Medical History:  Diagnosis Date  . AAA (abdominal aortic aneurysm) ([place])   . Anemia due to chronic blood loss 11/25/2008   Recurrent over the years EGD and colonoscopy x 2 each 2004 and 2010 without cause    . Atrial fibrillation ([place])   . BPH (benign prostatic hyperplasia)    reports aprocedure (TURP) remotely in [place] KitchenNo futher f/u w/ urology  . CAD (coronary artery disease)   . Diabetes mellitus, type 2 ([place])   . Diverticulosis    left colon  . Erosive gastritis   . Fall 08/08/2016   OUTSIDE AT HOME FACIAL TRAUMA   . GERD (gastroesophageal reflux disease)   . Hyperlipidemia   . Hypertension   . Hypothyroidism   . Insomnia    transient  . Osteopenia    dexa 2-11  . Vitamin B12 deficiency     Past Surgical History:  Procedure Laterality Date  . CARDIAC CATHETERIZATION  01/31/06, 09/25/10, 09-2011  . CATARACT EXTRACTION     right  . CHOLECYSTECTOMY, LAPAROSCOPIC    . COLONOSCOPY     multiple  . CORONARY ARTERY BYPASS GRAFT     1999 stents in 2000  . ESOPHAGOGASTRODUODENOSCOPY     multiple  . inguinal herniorrhaphies     bilateral  . pacemaker  1980, 1995, 2013   medtronic minix 8341  . PENILE PROSTHESIS IMPLANT  1992  . PROSTATECTOMY     transurethral  . right hip replacement    . TRANSTHORACIC ECHOCARDIOGRAM  12/2006    There were no vitals filed for this visit.  Subjective Assessment - 03/25/17 1444    Subjective  Pt  states he hasn't done much exercise since Saturday - has not felt well but is feeling better today    Pertinent History  Pacemaker, DM ; AAA; afib; cad; HTN; ostopenia;     Patient Stated Goals  Better walking; grocery stores are difficult and he walks slow     Currently in Pain?  No/denies                      [facility] Adult PT Treatment/Exercise - 03/25/17 1445      Transfers   Transfers  Sit to Stand    Sit to Stand  4: Min guard;4: Min assist    Number of Reps  Other reps (comment) 5 reps on Airex with UE support; 2 reps no UE support    Comments  min to mod assist needed with sit to stand on Airex without UE support      Ambulation/Gait   Ambulation/Gait  Yes    Ambulation/Gait Assistance  6: Modified independent (Device/Increase time)    Ambulation Distance (Feet)  115 Feet    Assistive device  None    Gait Pattern  Step-through pattern    Ambulation Surface  Indoor;Level      Knee/Hip Exercises: Standing   Heel Raises  Both;1 set;10 reps    Forward Step Up  Right;Left;1 set;10 reps;Step Height: 6" minimal bil. UE support          Balance Exercises - 03/25/17 1447      Balance Exercises: Standing   Standing Eyes Opened  Wide (BOA);Foam/compliant surface;Head turns;5 reps on Bosu inside // bars with UE support    Balance Beam  blue balance beam - with UE support on bars prn- with horizontal head turns 10 reps ;  stepping over and back of beam inside // bars iwth UE support prn 10 reps eahc foot    Other Standing Exercises  Pt performed sideways ambulation inside // bars without UE support; added sidestepping on tiptoes 10' x 2 reps; sidestepping on heels for dorsiflexor strengthening 10' x 2 reps; crossovers insdie // bars 10' x 2 reps, then stepping behind 10' x 2 reps with UE support prn inside // bars      OTAGO PROGRAM   Hip ABductor  10 reps each leg    Toe Raises  10 reps, no support - each leg separately      Pt performed SLS activity of touching  balance bubbles in various order with min UE support on counter;  Performed Cone taps with each foot, progressing to tipping cone over and standing upright with CGA    Alternating stepping over and back of blue balance beam - 5 reps each leg- more LOB noted with RLE than with LLE stepping over Inside // bars - no UE support used   PT Short Term Goals - 03/19/17 0752      PT SHORT TERM GOAL #1   Title  Pt will increase gait speed to 3 ft/sec w/o use of assisted device; level ground w/o loss of balance for 1000' w/ RPE  < 5/ 10     Baseline  11/06 1000 ft in 6:11.49 = 2.69 ft/sec RPE remained 11 (<5 point change)    Time  4    Period  Weeks    Status  Partially Met      PT SHORT TERM GOAL #2   Title  Pt will be independent with HEP focused on large stepping mvt and progressive strengthening using the OTEGA routines.     Baseline  11/6 Pt required instructional cues for 2 of his home exercises, otherwise completing correctly    Time  4    Period  Weeks    Status  Partially Met        PT Long Term Goals - 02/14/17 1627      PT LONG TERM GOAL #1   Title  Pt will demonstrate a low fall risk status , evident by scoring > 54 on BERG  > 20 on DGI and 4/4 on MOD CTSIB    Time  8    Period  Weeks    Status  New    Target Date  04/16/17      PT LONG TERM GOAL #2   Title  Pt will be able to walk at 4 ft /sec x 5 min with supervision with RPE < 5/10     Time  8    Period  Weeks    Status  New    Target Date  04/16/17      PT LONG TERM GOAL #3   Title  Pt will be able to independently perform a floor to stand  transfer safely and independent of need of assistance or supervision. Would like to see him doing floor transfer as part of his HEP    Time  8    Period  Weeks    Target Date  04/16/17            Plan - 03/25/17 1456    Clinical Impression Statement  Pt did well with standing balance activities with slightly more unsteadiness noted with Rt SLS than with Lt SLS; pt also  has unsteadiness with maintaining balance on compliant surface with head turns, indicative of decr. vestibular function:  pt reported fatigue at end of session    Rehab Potential  Excellent    Clinical Impairments Affecting Rehab Potential  bilateral shoulder ROM deficits;     PT Frequency  2x / week    PT Duration  8 weeks    PT Treatment/Interventions  Neuromuscular re-education;Balance training;Therapeutic exercise;Therapeutic activities;Functional mobility training;Stair training;Gait training;Patient/family education;Vestibular    PT Next Visit Plan  cont balance and gait training - work toward LTG's - pt performing OTAGA exs (hip abduction and single limb toe raise correctly on 03-25-17)    PT [place] and Agree with Plan of Care  Patient       Patient will benefit from skilled therapeutic intervention in order to improve the following deficits and impairments:  Decreased activity tolerance, Decreased balance, Decreased coordination, Decreased range of motion, Decreased mobility, Decreased strength, Difficulty walking, Impaired flexibility  Visit Diagnosis: Muscle weakness (generalized)  Difficulty in walking, not elsewhere classified     Problem List Patient Active Problem List   Diagnosis Date Noted  . Depression 03/12/2017  . Rotator cuff tear, right 08/18/2016  . HLD (hyperlipidemia) 08/18/2016  . Nasal bone fracture 08/18/2016  . Syncope and collapse 08/08/2016  . SAH (subarachnoid hemorrhage) ([place]) 08/08/2016  . PCP NOTES >>>>>>>>>>>>>>>>>>>> 09/28/2015  . Vasomotor rhinitis 09/14/2014  . Cough 03/01/2014  . Right shoulder injury 03/15/2013  . Hiatal hernia 01/28/2013  . Erosive gastritis 01/28/2013  . Warfarin anticoagulation 01/28/2013  . Medicare annual wellness visit, subsequent 07/14/2012  . BPH (benign prostatic hyperplasia) 06/07/2009  . Osteopenia 06/07/2009  . Anemia due to chronic blood loss 11/25/2008  . Vitamin B 12  deficiency 06/01/2008  . HTN (hypertension) 06/01/2008  . PACEMAKER, PERMANENT 06/01/2008  . GERD 03/31/2007  . Hypothyroidism 01/26/2007  . DM (diabetes mellitus) with complications ([place]) 21/30/8657  . Permanent atrial fibrillation ([place]) 01/26/2007  . Dyslipidemia 10/30/2006  . Coronary artery disease involving native coronary artery of native heart without angina pectoris 10/02/2006  . HIP REPLACEMENT, RIGHT, HX OF 02/04/2003    Alda Lea, PT 03/25/2017, 3:06 PM  [facility] [address] [place], [place], [address] Phone: [phone]   Fax:  [phone]  Name: [name] MRN: [id number] Date of Birth: [birth year]

## 2017-03-27 ENCOUNTER — Ambulatory Visit: Payer: Medicare HMO | Admitting: Physical Therapy

## 2017-03-31 ENCOUNTER — Telehealth: Payer: Self-pay | Admitting: Internal Medicine

## 2017-03-31 ENCOUNTER — Ambulatory Visit: Payer: Medicare HMO | Admitting: Physical Therapy

## 2017-03-31 NOTE — Telephone Encounter (Signed)
Spoke w/ Pt, informed that letter is ready at front desk for pick up. Pt verbalized understanding.

## 2017-03-31 NOTE — Telephone Encounter (Signed)
Caller name: Relation to VZ:DGLO Call back number: [phone] Pharmacy:  Reason for call: pt states he received a letter from his medicaid office stating that it will expire the end of November. Pt is needing a letter from [name] stating that it is necessary for his benefits to continue based on medical necessity. Please advise, pt states he will pick up the letter when complete. Please call

## 2017-03-31 NOTE — Telephone Encounter (Signed)
New message     Pt is asking for a call back from Dr. Harrington Challenger and Dr. Lovena Le nurse. He said he needs a letter from both doctors. Please call.

## 2017-03-31 NOTE — Telephone Encounter (Signed)
Please advise 

## 2017-03-31 NOTE — Telephone Encounter (Signed)
Please print  To whom it may concern [name] is a patient of mine, he takes a number of medications including an anticoagulant, Eliquis that is very expensive. In the past we tried to manage his anticoagulation with Coumadin but that was very difficult for him. In my opinion, he needs financial support to prevent him from getting sicker. Continue getting Medicaid will be of great help for him.

## 2017-03-31 NOTE — Telephone Encounter (Signed)
Letter printed, awaiting MD signature.

## 2017-04-01 ENCOUNTER — Encounter: Payer: Self-pay | Admitting: *Deleted

## 2017-04-01 ENCOUNTER — Ambulatory Visit: Payer: Medicare HMO | Admitting: Physical Therapy

## 2017-04-01 NOTE — Telephone Encounter (Signed)
Letter on behalf of Dr. Lovena Le completed.  Placed with letter by Dr. Harrington Challenger at front desk for pick up.  Notified Pt letters available for pick up.  Pt indicates understanding.

## 2017-04-01 NOTE — Telephone Encounter (Signed)
Requesting letter from Dr. Harrington Challenger and Dr. Lovena Le that he can send to [facility] office.  His benefits are being cancelled at the end of November, so he is requesting a letter to help support that his benefits are necessary.   Letter sent; will forward to Dr. Harrington Challenger for review.

## 2017-04-02 ENCOUNTER — Encounter: Payer: Self-pay | Admitting: Physical Therapy

## 2017-04-02 NOTE — Therapy (Signed)
[facility] [address] [place], [place], [address] Phone: [phone]   Fax:  [phone]  Patient Details  Name: [name] MRN: [id number] Date of Birth: Nov 08, 1925 Referring Provider: Dorris Carnes, MD  Encounter Date: 04/02/2017  PHYSICAL THERAPY DISCHARGE SUMMARY  Visits from Start of Care: 8  Current functional level related to goals / functional outcomes: Unable to assess due to patient did not return for therapy. He called and stated he will not be back due to financial reasons.   Remaining deficits: See last PN   Education / Equipment: HEP-OTAGO  Plan: Patient agrees to discharge.  Patient goals were not met. Patient is being discharged due to financial reasons.  ?????       Rexanne Mano, PT 04/02/2017, 2:00 PM  [facility] [address] [place], [place], [address] Phone: [phone]   Fax:  [phone]

## 2017-04-08 ENCOUNTER — Ambulatory Visit: Payer: Medicare HMO | Admitting: Physical Therapy

## 2017-04-10 ENCOUNTER — Ambulatory Visit: Payer: Medicare HMO | Admitting: Physical Therapy

## 2017-04-15 ENCOUNTER — Ambulatory Visit: Payer: Medicare HMO | Admitting: Physical Therapy

## 2017-04-16 ENCOUNTER — Other Ambulatory Visit: Payer: Self-pay | Admitting: Internal Medicine

## 2017-04-17 ENCOUNTER — Ambulatory Visit: Payer: Medicare HMO | Admitting: Physical Therapy

## 2017-04-22 ENCOUNTER — Ambulatory Visit: Payer: Medicare HMO | Admitting: Internal Medicine

## 2017-04-23 ENCOUNTER — Ambulatory Visit (INDEPENDENT_AMBULATORY_CARE_PROVIDER_SITE_OTHER): Payer: Medicare HMO | Admitting: Internal Medicine

## 2017-04-23 ENCOUNTER — Encounter: Payer: Self-pay | Admitting: Internal Medicine

## 2017-04-23 VITALS — BP 116/68 | HR 82 | Temp 97.6°F | Resp 14 | Ht 67.0 in | Wt 151.2 lb

## 2017-04-23 DIAGNOSIS — F329 Major depressive disorder, single episode, unspecified: Secondary | ICD-10-CM | POA: Diagnosis not present

## 2017-04-23 DIAGNOSIS — E118 Type 2 diabetes mellitus with unspecified complications: Secondary | ICD-10-CM | POA: Diagnosis not present

## 2017-04-23 MED ORDER — ESCITALOPRAM OXALATE 10 MG PO TABS: 10.0000 mg | ORAL_TABLET | Freq: Every day | ORAL | 2 refills | Status: DC

## 2017-04-23 NOTE — Progress Notes (Signed)
Pre visit review using our clinic review tool, if applicable. No additional management support is needed unless otherwise documented below in the visit note. 

## 2017-04-23 NOTE — Progress Notes (Signed)
Subjective:    Patient ID: [name], male    DOB: 29-Aug-1925, 83 y.o.   MRN: [id number]  DOS:  04/23/2017 Type of visit - description : f/u Interval history: See last visit, he was seen with depression and weight loss. Started Lexapro, good compliance and tolerance.  Mood has significantly improved.  He is eating more, has gained weight. A1c was quite elevated, referred to endocrinology, it failed.  CBGs when check in the 190s range. Taking Lantus 38 units daily  Wt Readings from Last 3 Encounters:  04/23/17 151 lb 4 oz (68.6 kg)  03/13/17 143 lb 2 oz (64.9 kg)  03/11/17 142 lb 6 oz (64.6 kg)     Review of Systems Sleeping well.  No suicidal ideas.  Past Medical History:  Diagnosis Date  . AAA (abdominal aortic aneurysm) ([place])   . Anemia due to chronic blood loss 11/25/2008   Recurrent over the years EGD and colonoscopy x 2 each 2004 and 2010 without cause    . Atrial fibrillation ([place])   . BPH (benign prostatic hyperplasia)    reports aprocedure (TURP) remotely in [place].Marland KitchenNo futher f/u w/ urology  . CAD (coronary artery disease)   . Diabetes mellitus, type 2 ([place])   . Diverticulosis    left colon  . Erosive gastritis   . Fall 08/08/2016   OUTSIDE AT HOME FACIAL TRAUMA   . GERD (gastroesophageal reflux disease)   . Hyperlipidemia   . Hypertension   . Hypothyroidism   . Insomnia    transient  . Osteopenia    dexa 2-11  . Vitamin B12 deficiency     Past Surgical History:  Procedure Laterality Date  . CARDIAC CATHETERIZATION  01/31/06, 09/25/10, 09-2011  . CATARACT EXTRACTION     right  . CHOLECYSTECTOMY, LAPAROSCOPIC    . COLONOSCOPY     multiple  . CORONARY ARTERY BYPASS GRAFT     1999 stents in 2000  . ESOPHAGOGASTRODUODENOSCOPY     multiple  . GIVENS CAPSULE STUDY N/A 11/04/2012   Procedure: GIVENS CAPSULE STUDY;  Surgeon: Gatha Mayer, MD;  Location: [facility] ENDOSCOPY;  Service: Endoscopy;  Laterality: N/A;  . inguinal herniorrhaphies     bilateral  . LEFT  HEART CATHETERIZATION WITH CORONARY ANGIOGRAM N/A 10/04/2011   Procedure: LEFT HEART CATHETERIZATION WITH CORONARY ANGIOGRAM;  Surgeon: Burnell Blanks, MD;  Location: [facility] CATH LAB;  Service: Cardiovascular;  Laterality: N/A;  . pacemaker  [employer], 2013   medtronic minix 8341  . PENILE PROSTHESIS IMPLANT  1992  . PERMANENT PACEMAKER GENERATOR CHANGE N/A 06/05/2011   Procedure: PERMANENT PACEMAKER GENERATOR CHANGE;  Surgeon: Evans Lance, MD;  Location: [facility] CATH LAB;  Service: Cardiovascular;  Laterality: N/A;  . PROSTATECTOMY     transurethral  . right hip replacement    . TRANSTHORACIC ECHOCARDIOGRAM  12/2006    Social History   Socioeconomic History  . Marital status: Legally Separated    Spouse name: Not on file  . Number of children: 5  . Years of education: Not on file  . Highest education level: Not on file  Social Needs  . Financial resource strain: Not on file  . Food insecurity - worry: Not on file  . Food insecurity - inability: Not on file  . Transportation needs - medical: Not on file  . Transportation needs - non-medical: Not on file  Occupational History  . Occupation: retired    Fish farm manager: RETIRED  Tobacco Use  . Smoking status:  Never Smoker  . Smokeless tobacco: Never Used  Substance and Sexual Activity  . Alcohol use: Yes    Alcohol/week: 0.6 - 1.2 oz    Types: 1 - 2 Glasses of wine per week    Comment: socially   . Drug use: No  . Sexual activity: No  Other Topics Concern  . Not on file  Social History Narrative   Lives by self, independent on ADL, retired. Separated from wife.    Daughters:   [name] ( lives in [place]) [phone]   [name] ( lives out of town)  [phone]            Allergies as of 04/23/2017      Reactions   Ace Inhibitors Cough   Codeine Phosphate Nausea Only   Hydrochlorothiazide W-triamterene Other (See Comments)   CRAMPING      Medication List        Accurate as of 04/23/17 11:59 PM. Always use your most  recent med list.          acetaminophen 325 MG tablet Commonly known as:  TYLENOL Take 650 mg by mouth every 6 (six) hours as needed for mild pain or moderate pain.   amoxicillin 500 MG capsule Commonly known as:  AMOXIL Take 4 capsules (2,000 mg total) by mouth See admin instructions. ONE HOUR PRIOR TO EACH DENTAL APPOINTMENT   apixaban 5 MG Tabs tablet Commonly known as:  ELIQUIS Take 1 tablet (5 mg total) by mouth 2 (two) times daily.   digoxin 0.125 MG tablet Commonly known as:  LANOXIN Take 0.5 tablets (0.0625 mg total) by mouth daily.   escitalopram 10 MG tablet Commonly known as:  LEXAPRO Take 1 tablet (10 mg total) by mouth daily.   fenofibrate micronized 134 MG capsule Commonly known as:  LOFIBRA Take 1 capsule (134 mg total) by mouth daily.   insulin glargine 100 UNIT/ML injection Commonly known as:  LANTUS Inject 50 Units into the skin at bedtime.   Insulin Pen Needle 31G X 6 MM Misc To use w/ Lantus   latanoprost 0.005 % ophthalmic solution Commonly known as:  XALATAN Place 1 drop into both eyes at bedtime.   levothyroxine 88 MCG tablet Commonly known as:  SYNTHROID, LEVOTHROID Take 1 tablet (88 mcg total) by mouth daily before breakfast.   metoprolol tartrate 25 MG tablet Commonly known as:  LOPRESSOR Take 25 mg by mouth 2 (two) times daily.   mirtazapine 15 MG tablet Commonly known as:  REMERON Take 1 tablet (15 mg total) by mouth at bedtime.   nitroGLYCERIN 0.4 MG SL tablet Commonly known as:  NITROSTAT Place 1 tablet (0.4 mg total) under the tongue every 5 (five) minutes x 3 doses as needed for chest pain. Then contact 911 or go to ER   omeprazole 40 MG capsule Commonly known as:  PRILOSEC Take 1 capsule (40 mg total) by mouth daily.   pantoprazole 40 MG tablet Commonly known as:  PROTONIX Take 1 tablet (40 mg total) by mouth daily before breakfast.   pravastatin 40 MG tablet Commonly known as:  PRAVACHOL Take 2 tablets (80 mg total)  by mouth daily.   sitaGLIPtin 100 MG tablet Commonly known as:  JANUVIA Take 1 tablet (100 mg total) by mouth daily.   triamcinolone ointment 0.1 % Commonly known as:  KENALOG   vitamin B-12 1000 MCG tablet Commonly known as:  CYANOCOBALAMIN Take 1 tablet (1,000 mcg total) by mouth daily.  Objective:   Physical Exam BP 116/68 (BP Location: Left Arm, Patient Position: Sitting, Cuff Size: Normal)   Pulse 82   Temp 97.6 F (36.4 C) (Oral)   Resp 14   Ht 5\' 7"  (1.702 m)   Wt 151 lb 4 oz (68.6 kg)   SpO2 97%   BMI 23.69 kg/m   General:   Well developed, well nourished . NAD.  HEENT:  Normocephalic . Face symmetric, atraumatic  Skin: Not pale. Not jaundice Neurologic:  alert & oriented X3.  Speech normal, gait appropriate for age and unassisted Psych--  Cognition and judgment appear intact.  Cooperative with normal attention span and concentration.  Behavior appropriate. No anxious or depressed appearing.  Definitely improved compared to last visit     Assessment & Plan:  Assessment DM, + neuropahty per exam 12-2015 CRI Creat ~ 1.3-1.4 HTN Hyperlipidemia Hypothyroidism CV: --CAD -- Dr Harrington Challenger --Atrial fibrillation, PPM --- Dr Lovena Le --AAA: [place] 06-2014 stable, was rec no further imagine  GI: Erosive gastritis, GERD, h/o anemia d/t  chronic blood loss BPH NEURO:  07-2016: Fall, vaso vagal syncope? : SAH, eld asa-warfarin Osteopenia- declined dexa 2015 B 12 deficiency  Plan: DM: Last A1c more than 11, referral to Endo failed (they called the patient but he did not return the call).  See instructions.  Also, reports he is doing Lantus 38 units, increase to 50. Depression: See last visit, started Lexapro, good compliance and tolerance, feeling much improved.  RF meds.  Also continue Remeron for insomnia Weight loss: Gaining weight since last visit, likely problem was related to depression. RTC 4 months

## 2017-04-23 NOTE — Patient Instructions (Signed)
  GO TO THE FRONT DESK Schedule your next appointment for a checkup in 3 or 4 months  Please call endocrinology and set an appointment with them regards your diabetes. [phone]  Take insulin 50 units daily along with all the other medications.

## 2017-04-24 NOTE — Assessment & Plan Note (Signed)
DM: Last A1c more than 11, referral to Endo failed (they called the patient but he did not return the call).  See instructions.  Also, reports he is doing Lantus 38 units, increase to 50. Depression: See last visit, started Lexapro, good compliance and tolerance, feeling much improved.  RF meds.  Also continue Remeron for insomnia Weight loss: Gaining weight since last visit, likely problem was related to depression. RTC 4 months

## 2017-04-28 DIAGNOSIS — H61002 Unspecified perichondritis of left external ear: Secondary | ICD-10-CM | POA: Diagnosis not present

## 2017-04-28 DIAGNOSIS — H61022 Chronic perichondritis of left external ear: Secondary | ICD-10-CM | POA: Diagnosis not present

## 2017-05-20 DIAGNOSIS — H61002 Unspecified perichondritis of left external ear: Secondary | ICD-10-CM | POA: Diagnosis not present

## 2017-05-20 DIAGNOSIS — H61022 Chronic perichondritis of left external ear: Secondary | ICD-10-CM | POA: Diagnosis not present

## 2017-06-02 ENCOUNTER — Other Ambulatory Visit: Payer: Self-pay | Admitting: Internal Medicine

## 2017-07-07 ENCOUNTER — Encounter (HOSPITAL_COMMUNITY): Payer: Self-pay | Admitting: *Deleted

## 2017-07-07 ENCOUNTER — Other Ambulatory Visit: Payer: Self-pay

## 2017-07-07 ENCOUNTER — Emergency Department (HOSPITAL_COMMUNITY): Payer: Medicare HMO

## 2017-07-07 DIAGNOSIS — R079 Chest pain, unspecified: Secondary | ICD-10-CM | POA: Insufficient documentation

## 2017-07-07 DIAGNOSIS — Z5321 Procedure and treatment not carried out due to patient leaving prior to being seen by health care provider: Secondary | ICD-10-CM | POA: Insufficient documentation

## 2017-07-07 LAB — I-STAT TROPONIN, ED: TROPONIN I, POC: 0 ng/mL (ref 0.00–0.08)

## 2017-07-07 LAB — BASIC METABOLIC PANEL
Anion gap: 7 (ref 5–15)
BUN: 14 mg/dL (ref 6–20)
CALCIUM: 8.8 mg/dL — AB (ref 8.9–10.3)
CO2: 24 mmol/L (ref 22–32)
CREATININE: 0.94 mg/dL (ref 0.61–1.24)
Chloride: 101 mmol/L (ref 101–111)
GFR calc non Af Amer: >60 mL/min (ref >60)
Glucose, Bld: 357 mg/dL — ABNORMAL HIGH (ref 65–99)
Potassium: 4.4 mmol/L (ref 3.5–5.1)
SODIUM: 132 mmol/L — AB (ref 135–145)

## 2017-07-07 LAB — CBC
HCT: 43 % (ref 39.0–52.0)
Hemoglobin: 14.7 g/dL (ref 13.0–17.0)
MCH: 32.9 pg (ref 26.0–34.0)
MCHC: 34.2 g/dL (ref 30.0–36.0)
MCV: 96.2 fL (ref 78.0–100.0)
PLATELETS: 155 10*3/uL (ref 150–400)
RBC: 4.47 MIL/uL (ref 4.22–5.81)
RDW: 13.2 % (ref 11.5–15.5)
WBC: 5.7 10*3/uL (ref 4.0–10.5)

## 2017-07-07 NOTE — ED Triage Notes (Signed)
Pt started having an episode of centralized chest pain while in a store this morning. Reports pain stopped once he went and sat in the car. At 9pm tonight, pt had another episode of chest pain, took 2 nitros with relief of pain. Pt did not take nitro. Has a pacemaker and is scheduled to have a checkup on 3/1

## 2017-07-08 ENCOUNTER — Emergency Department (HOSPITAL_COMMUNITY)
Admission: EM | Admit: 2017-07-08 | Discharge: 2017-07-08 | Disposition: A | Discharge status: Home / Self Care | Payer: Medicare HMO | Attending: Emergency Medicine | Admitting: Emergency Medicine

## 2017-07-08 NOTE — ED Notes (Signed)
Patient leaving at this time after attempts made to reassure pt to stay to see doctor. Pt and family member leaving at this time.

## 2017-07-11 ENCOUNTER — Encounter: Payer: Self-pay | Admitting: Internal Medicine

## 2017-07-11 ENCOUNTER — Ambulatory Visit (INDEPENDENT_AMBULATORY_CARE_PROVIDER_SITE_OTHER): Payer: Medicare HMO | Admitting: Internal Medicine

## 2017-07-11 VITALS — BP 130/70 | HR 63 | Ht 67.0 in | Wt 156.0 lb

## 2017-07-11 DIAGNOSIS — I482 Chronic atrial fibrillation: Secondary | ICD-10-CM | POA: Diagnosis not present

## 2017-07-11 DIAGNOSIS — I10 Essential (primary) hypertension: Secondary | ICD-10-CM | POA: Diagnosis not present

## 2017-07-11 DIAGNOSIS — Z950 Presence of cardiac pacemaker: Secondary | ICD-10-CM | POA: Diagnosis not present

## 2017-07-11 DIAGNOSIS — I4821 Permanent atrial fibrillation: Secondary | ICD-10-CM

## 2017-07-11 MED ORDER — DIGOXIN 125 MCG PO TABS: 0.1250 mg | ORAL_TABLET | ORAL | 3 refills | Status: DC

## 2017-07-11 NOTE — Patient Instructions (Addendum)
Medication Instructions:  Your physician has recommended you make the following change in your medication:  1.  Change your digoxin to 0.125 mg one tablet every other day.  Labwork: None ordered.  Testing/Procedures: None ordered.  Follow-Up:  You will follow up with the device clinic in 6 months for a device check.   Your physician wants you to follow-up in: one year with Dr. Lovena Le.   You will receive a reminder letter in the mail two months in advance. If you don't receive a letter, please call our office to schedule the follow-up appointment.  Any Other Special Instructions Will Be Listed Below (If Applicable).  If you need a refill on your cardiac medications before your next appointment, please call your pharmacy.

## 2017-07-11 NOTE — Progress Notes (Signed)
HPI Mr. Couey returns today for followup. He is an 84 year old man with a history of symptomatic bradycardia, status post permanent pacemaker insertion over 30 years ago. He is status post his second replacement approximately 6 years ago. In the interim, no chest pain, shortness of breath, or syncope. No peripheral edema. HIe denies fevers or chills, night sweats, or recurrent nausea or vomiting. He notes that he has had some trouble sleeping and is very sedentary. He gets tired easily and thinks he is less steady on his feet.  Allergies  Allergen Reactions  . Ace Inhibitors Cough  . Codeine Phosphate Nausea Only  . Hydrochlorothiazide W-Triamterene Other (See Comments)    CRAMPING     Current Outpatient Medications  Medication Sig Dispense Refill  . acetaminophen (TYLENOL) 325 MG tablet Take 650 mg by mouth every 6 (six) hours as needed for mild pain or moderate pain.     [name] amoxicillin (AMOXIL) 500 MG capsule Take 4 capsules (2,000 mg total) by mouth See admin instructions. ONE HOUR PRIOR TO EACH DENTAL APPOINTMENT 10 capsule 0  . apixaban (ELIQUIS) 5 MG TABS tablet Take 1 tablet (5 mg total) by mouth 2 (two) times daily. 180 tablet 0  . digoxin (LANOXIN) 0.125 MG tablet Take 0.5 tablets (0.0625 mg total) by mouth daily. 45 tablet 3  . escitalopram (LEXAPRO) 10 MG tablet Take 1 tablet (10 mg total) by mouth daily. 90 tablet 2  . fenofibrate micronized (LOFIBRA) 134 MG capsule Take 1 capsule (134 mg total) by mouth daily. 90 capsule 2  . insulin glargine (LANTUS) 100 UNIT/ML injection Inject 50 Units into the skin at bedtime.    . Insulin Pen Needle 31G X 6 MM MISC To use w/ Lantus 100 each 12  . latanoprost (XALATAN) 0.005 % ophthalmic solution Place 1 drop into both eyes at bedtime.    [name] levothyroxine (SYNTHROID, LEVOTHROID) 88 MCG tablet Take 1 tablet (88 mcg total) by mouth daily before breakfast. 90 tablet 2  . metoprolol tartrate (LOPRESSOR) 25 MG tablet Take 1 tablet (25 mg  total) by mouth 2 (two) times daily. 180 tablet 1  . mirtazapine (REMERON) 15 MG tablet Take 1 tablet (15 mg total) by mouth at bedtime. 90 tablet 1  . nitroGLYCERIN (NITROSTAT) 0.4 MG SL tablet Place 1 tablet (0.4 mg total) under the tongue every 5 (five) minutes x 3 doses as needed for chest pain. Then contact 911 or go to ER 25 tablet 3  . omeprazole (PRILOSEC) 40 MG capsule Take 1 capsule (40 mg total) by mouth daily. 90 capsule 3  . pantoprazole (PROTONIX) 40 MG tablet Take 1 tablet (40 mg total) by mouth daily before breakfast. 90 tablet 3  . pravastatin (PRAVACHOL) 40 MG tablet Take 2 tablets (80 mg total) by mouth daily. 180 tablet 1  . sitaGLIPtin (JANUVIA) 100 MG tablet Take 1 tablet (100 mg total) by mouth daily. 90 tablet 1  . triamcinolone ointment (KENALOG) 0.1 %     . vitamin B-12 (CYANOCOBALAMIN) 1000 MCG tablet Take 1 tablet (1,000 mcg total) by mouth daily. 90 tablet 2   No current facility-administered medications for this visit.      Past Medical History:  Diagnosis Date  . AAA (abdominal aortic aneurysm) ([place])   . Anemia due to chronic blood loss 11/25/2008   Recurrent over the years EGD and colonoscopy x 2 each 2004 and 2010 without cause    . Atrial fibrillation ([place])   . BPH (  benign prostatic hyperplasia)    reports aprocedure (TURP) remotely in [place].Marland KitchenNo futher f/u w/ urology  . CAD (coronary artery disease)   . Diabetes mellitus, type 2 ([place])   . Diverticulosis    left colon  . Erosive gastritis   . Fall 08/08/2016   OUTSIDE AT HOME FACIAL TRAUMA   . GERD (gastroesophageal reflux disease)   . Hyperlipidemia   . Hypertension   . Hypothyroidism   . Insomnia    transient  . Osteopenia    dexa 2-11  . Vitamin B12 deficiency     ROS:   All systems reviewed and negative except as noted in the HPI.   Past Surgical History:  Procedure Laterality Date  . CARDIAC CATHETERIZATION  01/31/06, 09/25/10, 09-2011  . CATARACT EXTRACTION     right  .  CHOLECYSTECTOMY, LAPAROSCOPIC    . COLONOSCOPY     multiple  . CORONARY ARTERY BYPASS GRAFT     1999 stents in 2000  . ESOPHAGOGASTRODUODENOSCOPY     multiple  . GIVENS CAPSULE STUDY N/A 11/04/2012   Procedure: GIVENS CAPSULE STUDY;  Surgeon: Gatha Mayer, MD;  Location: [facility] ENDOSCOPY;  Service: Endoscopy;  Laterality: N/A;  . inguinal herniorrhaphies     bilateral  . LEFT HEART CATHETERIZATION WITH CORONARY ANGIOGRAM N/A 10/04/2011   Procedure: LEFT HEART CATHETERIZATION WITH CORONARY ANGIOGRAM;  Surgeon: Burnell Blanks, MD;  Location: [facility] CATH LAB;  Service: Cardiovascular;  Laterality: N/A;  . pacemaker  [place], 2013   medtronic minix [id number]  . PENILE PROSTHESIS IMPLANT  1992  . PERMANENT PACEMAKER GENERATOR CHANGE N/A 06/05/2011   Procedure: PERMANENT PACEMAKER GENERATOR CHANGE;  Surgeon: Evans Lance, MD;  Location: [facility] CATH LAB;  Service: Cardiovascular;  Laterality: N/A;  . PROSTATECTOMY     transurethral  . right hip replacement    . TRANSTHORACIC ECHOCARDIOGRAM  12/2006     Family History  Problem Relation Age of Onset  . Alzheimer's disease Mother   . Heart failure Father   . CVA Father   . Coronary artery disease Brother        cabg  . Alzheimer's disease Sister   . Mental illness Sister   . Alzheimer's disease Sister   . Other Sister        lung problems  . Diabetes Other        several siblings  . Prostate cancer Neg Hx   . Colon cancer Neg Hx   . Esophageal cancer Neg Hx   . Stomach cancer Neg Hx   . Rectal cancer Neg Hx      Social History   Socioeconomic History  . Marital status: Legally Separated    Spouse name: Not on file  . Number of children: 5  . Years of education: Not on file  . Highest education level: Not on file  Social Needs  . Financial resource strain: Not on file  . Food insecurity - worry: Not on file  . Food insecurity - inability: Not on file  . Transportation needs - medical: Not on file  . Transportation needs -  non-medical: Not on file  Occupational History  . Occupation: retired    Fish farm manager: RETIRED  Tobacco Use  . Smoking status: Never Smoker  . Smokeless tobacco: Never Used  Substance and Sexual Activity  . Alcohol use: Yes    Alcohol/week: 0.6 - 1.2 oz    Types: 1 - 2 Glasses of wine per week    Comment: socially   .  Drug use: No  . Sexual activity: No  Other Topics Concern  . Not on file  Social History Narrative   Lives by self, independent on ADL, retired. Separated from wife.    Daughters:   [name] ( lives in [place]) [phone]   [name] ( lives out of town)  [phone]           BP 130/70 (BP Location: Left Arm, Patient Position: Sitting, Cuff Size: Normal)   Pulse 63   Ht 5\' 7"  (1.702 m)   Wt 156 lb (70.8 kg)   SpO2 98%   BMI 24.43 kg/m   Physical Exam:  Well appearing elderly man, NAD HEENT: Unremarkable Neck:  6 cm JVD, no thyromegally Lymphatics:  No adenopathy Back:  No CVA tenderness Lungs:  Clear except for rare basilar rales. HEART:  Regular rate rhythm, no murmurs, no rubs, no clicks Abd:  soft, positive bowel sounds, no organomegally, no rebound, no guarding Ext:  2 plus pulses, no edema, no cyanosis, no clubbing Skin:  No rashes no nodules Neuro:  CN II through XII intact, motor grossly intact   DEVICE  Normal device function.  See PaceArt for details.   Assess/Plan: 1. CHB - he is asymptomatic, s/p PPM insertion 2. PPM - his Medtronic VVI PM is working normally. 3. Chronic atrial fib - his ventricular rate is well controlled. He is pacing essentially all of the time. 4. Coags - he has not fallen and is not bleeding. Continue current meds.  Mikle Bosworth.D.

## 2017-07-14 LAB — CUP PACEART INCLINIC DEVICE CHECK
Battery Impedance: 3094 Ohm
Brady Statistic RV Percent Paced: 62 %
Date Time Interrogation Session: 20190301204833
Implantable Lead Implant Date: 19950413
Lead Channel Impedance Value: 0 Ohm
Lead Channel Impedance Value: 520 Ohm
Lead Channel Pacing Threshold Amplitude: 0.75 V
Lead Channel Pacing Threshold Pulse Width: 0.4 ms
Lead Channel Sensing Intrinsic Amplitude: 11.2 mV
Lead Channel Setting Sensing Sensitivity: 4 mV
MDC IDC LEAD LOCATION: 753860
MDC IDC MSMT BATTERY REMAINING LONGEVITY: 23 mo
MDC IDC MSMT BATTERY VOLTAGE: 2.71 V
MDC IDC MSMT LEADCHNL RV PACING THRESHOLD AMPLITUDE: 0.625 V
MDC IDC MSMT LEADCHNL RV PACING THRESHOLD PULSEWIDTH: 0.4 ms
MDC IDC PG IMPLANT DT: 20130123
MDC IDC SET LEADCHNL RV PACING AMPLITUDE: 2.5 V
MDC IDC SET LEADCHNL RV PACING PULSEWIDTH: 0.4 ms

## 2017-07-30 ENCOUNTER — Ambulatory Visit (INDEPENDENT_AMBULATORY_CARE_PROVIDER_SITE_OTHER): Payer: Medicare HMO | Admitting: Internal Medicine

## 2017-07-30 ENCOUNTER — Encounter: Payer: Self-pay | Admitting: Internal Medicine

## 2017-07-30 VITALS — BP 124/66 | HR 86 | Temp 97.6°F | Resp 14 | Ht 67.0 in | Wt 163.4 lb

## 2017-07-30 DIAGNOSIS — I4821 Permanent atrial fibrillation: Secondary | ICD-10-CM

## 2017-07-30 DIAGNOSIS — I482 Chronic atrial fibrillation: Secondary | ICD-10-CM

## 2017-07-30 DIAGNOSIS — E118 Type 2 diabetes mellitus with unspecified complications: Secondary | ICD-10-CM

## 2017-07-30 DIAGNOSIS — F329 Major depressive disorder, single episode, unspecified: Secondary | ICD-10-CM | POA: Diagnosis not present

## 2017-07-30 DIAGNOSIS — E039 Hypothyroidism, unspecified: Secondary | ICD-10-CM | POA: Diagnosis not present

## 2017-07-30 LAB — TSH: TSH: 1.66 u[IU]/mL (ref 0.35–4.50)

## 2017-07-30 LAB — HEMOGLOBIN A1C: HEMOGLOBIN A1C: 11.4 % — AB (ref 4.6–6.5)

## 2017-07-30 MED ORDER — AZELASTINE HCL 0.1 % NA SOLN: 2.0000 | Freq: Every evening | NASAL | 12 refills | Status: DC | PRN

## 2017-07-30 NOTE — Progress Notes (Signed)
Subjective:    Patient ID: [name], male    DOB: 06-04-1925, 84 y.o.   MRN: [id number]  DOS:  07/30/2017 Type of visit - description : f/u Interval history: Saw cardiology, note reviewed DM: Good compliance of medication, check his blood sugar sometimes, this morning was 136.  Was unable to give me other readings but states  they are all "about the same". Depression: Good medication compliance.  No apparent side effects. Went to the ER with chest pain, chart reviewed, left without being seen.  Labs were okay.  Wt Readings from Last 3 Encounters:  07/30/17 163 lb 6 oz (74.1 kg)  07/11/17 156 lb (70.8 kg)  04/23/17 151 lb 4 oz (68.6 kg)    Review of Systems Denies nausea, vomiting, diarrhea. No further chest pain. "This is the best I felt in a while"  Past Medical History:  Diagnosis Date  . AAA (abdominal aortic aneurysm) ([place])   . Anemia due to chronic blood loss 11/25/2008   Recurrent over the years EGD and colonoscopy x 2 each 2004 and 2010 without cause    . Atrial fibrillation ([place])   . BPH (benign prostatic hyperplasia)    reports aprocedure (TURP) remotely in [place].Marland KitchenNo futher f/u w/ urology  . CAD (coronary artery disease)   . Diabetes mellitus, type 2 ([place])   . Diverticulosis    left colon  . Erosive gastritis   . Fall 08/08/2016   OUTSIDE AT HOME FACIAL TRAUMA   . GERD (gastroesophageal reflux disease)   . Hyperlipidemia   . Hypertension   . Hypothyroidism   . Insomnia    transient  . Osteopenia    dexa 2-11  . Vitamin B12 deficiency     Past Surgical History:  Procedure Laterality Date  . CARDIAC CATHETERIZATION  01/31/06, 09/25/10, 09-2011  . CATARACT EXTRACTION     right  . CHOLECYSTECTOMY, LAPAROSCOPIC    . COLONOSCOPY     multiple  . CORONARY ARTERY BYPASS GRAFT     1999 stents in 2000  . ESOPHAGOGASTRODUODENOSCOPY     multiple  . GIVENS CAPSULE STUDY N/A 11/04/2012   Procedure: GIVENS CAPSULE STUDY;  Surgeon: Gatha Mayer, MD;  Location: [facility]  ENDOSCOPY;  Service: Endoscopy;  Laterality: N/A;  . inguinal herniorrhaphies     bilateral  . LEFT HEART CATHETERIZATION WITH CORONARY ANGIOGRAM N/A 10/04/2011   Procedure: LEFT HEART CATHETERIZATION WITH CORONARY ANGIOGRAM;  Surgeon: Burnell Blanks, MD;  Location: [facility] CATH LAB;  Service: Cardiovascular;  Laterality: N/A;  . pacemaker  [place], 2013   medtronic minix 8341  . PENILE PROSTHESIS IMPLANT  1992  . PERMANENT PACEMAKER GENERATOR CHANGE N/A 06/05/2011   Procedure: PERMANENT PACEMAKER GENERATOR CHANGE;  Surgeon: Evans Lance, MD;  Location: [facility] CATH LAB;  Service: Cardiovascular;  Laterality: N/A;  . PROSTATECTOMY     transurethral  . right hip replacement    . TRANSTHORACIC ECHOCARDIOGRAM  12/2006    Social History   Socioeconomic History  . Marital status: Legally Separated    Spouse name: Not on file  . Number of children: 5  . Years of education: Not on file  . Highest education level: Not on file  Occupational History  . Occupation: retired    Fish farm manager: RETIRED  Social Needs  . Financial resource strain: Not on file  . Food insecurity:    Worry: Not on file    Inability: Not on file  . Transportation needs:    Medical:  Not on file    Non-medical: Not on file  Tobacco Use  . Smoking status: Never Smoker  . Smokeless tobacco: Never Used  Substance and Sexual Activity  . Alcohol use: Yes    Alcohol/week: 0.6 - 1.2 oz    Types: 1 - 2 Glasses of wine per week    Comment: socially   . Drug use: No  . Sexual activity: Never  Lifestyle  . Physical activity:    Days per week: Not on file    Minutes per session: Not on file  . Stress: Not on file  Relationships  . Social connections:    Talks on phone: Not on file    Gets together: Not on file    Attends religious service: Not on file    Active member of club or organization: Not on file    Attends meetings of clubs or organizations: Not on file    Relationship status: Not on file  . Intimate  partner violence:    Fear of current or ex partner: Not on file    Emotionally abused: Not on file    Physically abused: Not on file    Forced sexual activity: Not on file  Other Topics Concern  . Not on file  Social History Narrative   Lives by self, independent on ADL, retired. Separated from wife.    Daughters:   [name] ( lives in [place]) [phone]   [name] ( lives out of town)  [phone]            Allergies as of 07/30/2017      Reactions   Ace Inhibitors Cough   Codeine Phosphate Nausea Only   Hydrochlorothiazide W-triamterene Other (See Comments)   CRAMPING      Medication List        Accurate as of 07/30/17 11:59 PM. Always use your most recent med list.          acetaminophen 325 MG tablet Commonly known as:  TYLENOL Take 650 mg by mouth every 6 (six) hours as needed for mild pain or moderate pain.   amoxicillin 500 MG capsule Commonly known as:  AMOXIL Take 4 capsules (2,000 mg total) by mouth See admin instructions. ONE HOUR PRIOR TO EACH DENTAL APPOINTMENT   apixaban 5 MG Tabs tablet Commonly known as:  ELIQUIS Take 1 tablet (5 mg total) by mouth 2 (two) times daily.   azelastine 0.1 % nasal spray Commonly known as:  ASTELIN Place 2 sprays into both nostrils at bedtime as needed for rhinitis or allergies. Use in each nostril as directed   digoxin 0.125 MG tablet Commonly known as:  LANOXIN Take 1 tablet (0.125 mg total) by mouth every other day.   escitalopram 10 MG tablet Commonly known as:  LEXAPRO Take 1 tablet (10 mg total) by mouth daily.   fenofibrate micronized 134 MG capsule Commonly known as:  LOFIBRA Take 1 capsule (134 mg total) by mouth daily.   insulin glargine 100 UNIT/ML injection Commonly known as:  LANTUS Inject 50 Units into the skin at bedtime.   Insulin Pen Needle 31G X 6 MM Misc To use w/ Lantus   latanoprost 0.005 % ophthalmic solution Commonly known as:  XALATAN Place 1 drop into both eyes at bedtime.     levothyroxine 88 MCG tablet Commonly known as:  SYNTHROID, LEVOTHROID Take 1 tablet (88 mcg total) by mouth daily before breakfast.   metoprolol tartrate 25 MG tablet Commonly known as:  LOPRESSOR  Take 1 tablet (25 mg total) by mouth 2 (two) times daily.   mirtazapine 15 MG tablet Commonly known as:  REMERON Take 1 tablet (15 mg total) by mouth at bedtime.   nitroGLYCERIN 0.4 MG SL tablet Commonly known as:  NITROSTAT Place 1 tablet (0.4 mg total) under the tongue every 5 (five) minutes x 3 doses as needed for chest pain. Then contact 911 or go to ER   omeprazole 40 MG capsule Commonly known as:  PRILOSEC Take 1 capsule (40 mg total) by mouth daily.   pantoprazole 40 MG tablet Commonly known as:  PROTONIX Take 1 tablet (40 mg total) by mouth daily before breakfast.   pravastatin 40 MG tablet Commonly known as:  PRAVACHOL Take 2 tablets (80 mg total) by mouth daily.   sitaGLIPtin 100 MG tablet Commonly known as:  JANUVIA Take 1 tablet (100 mg total) by mouth daily.   triamcinolone ointment 0.1 % Commonly known as:  KENALOG   vitamin B-12 1000 MCG tablet Commonly known as:  CYANOCOBALAMIN Take 1 tablet (1,000 mcg total) by mouth daily.          Objective:   Physical Exam BP 124/66 (BP Location: Left Arm, Patient Position: Sitting, Cuff Size: Small)   Pulse 86   Temp 97.6 F (36.4 C) (Oral)   Resp 14   Ht 5\' 7"  (1.702 m)   Wt 163 lb 6 oz (74.1 kg)   SpO2 98%   BMI 25.59 kg/m  General:   Well developed, well nourished . NAD.  HEENT:  Normocephalic . Face symmetric, atraumatic Lungs:  CTA B Normal respiratory effort, no intercostal retractions, no accessory muscle use. Heart: Regular.  No pretibial edema bilaterally  Skin: Not pale. Not jaundice Neurologic:  alert & oriented X3.  Speech normal, gait appropriate for age and unassisted Psych--  Cognition and judgment appear intact.  Cooperative with normal attention span and concentration.  Behavior  appropriate. No anxious or depressed appearing.      Assessment & Plan:    Assessment DM, + neuropahty per exam 12-2015 CRI Creat ~ 1.3-1.4 HTN Hyperlipidemia Hypothyroidism CV: --CAD -- Dr Harrington Challenger --Atrial fibrillation, PPM --- Dr Lovena Le --AAA: [place] 06-2014 stable, was rec no further imagine  GI: Erosive gastritis, GERD, h/o anemia d/t  chronic blood loss BPH NEURO:  07-2016: Fall, vaso vagal syncope? : SAH, eld asa-warfarin Osteopenia- declined dexa 2015 B 12 deficiency  Plan: DM: Not well controlled, currently on Lantus 50 and Januvia.  Advised to check CBGs at least once daily, see AVS.  Check a A1c.  Further advised with results.  Consider refer to Endo again if possible. Atrial fibrillation, pacemaker: Saw cardiology, 07/11/2017, felt to be stable Anxiety depression: Well controlled on Lexapro and Remeron. Hypothyroidism  Check a TSH CAD: Had a couple of episodes of chest pain, went to the ER 07/08/2017, left without being seen.  No further events, has nitroglycerin just in case.  Rx observation. Rhinitis: see HPI,has features of allergic and motor rhinitis.  Recommend Flonase and Astelin.   RTC 4 months

## 2017-07-30 NOTE — Progress Notes (Signed)
Pre visit review using our clinic review tool, if applicable. No additional management support is needed unless otherwise documented below in the visit note. 

## 2017-07-30 NOTE — Patient Instructions (Addendum)
GO TO THE LAB : Get the blood work    GO TO THE FRONT DESK Schedule your next appointment for a checkup in 4 months  Flonase: : 2 sprays on each side of the nose in the morning Astelin: 2 sprays on each side of the nose at night  Diabetes: Check your blood sugar  once a day    at different times of the day  GOALS: Fasting before a meal 70- 130 2 hours after a meal less than 180

## 2017-07-31 NOTE — Assessment & Plan Note (Signed)
DM: Not well controlled, currently on Lantus 50 and Januvia.  Advised to check CBGs at least once daily, see AVS.  Check a A1c.  Further advised with results.  Consider refer to Endo again if possible. Atrial fibrillation, pacemaker: Saw cardiology, 07/11/2017, felt to be stable Anxiety depression: Well controlled on Lexapro and Remeron. Hypothyroidism  Check a TSH CAD: Had a couple of episodes of chest pain, went to the ER 07/08/2017, left without being seen.  No further events, has nitroglycerin just in case.  Rx observation. Rhinitis: see HPI,has features of allergic and motor rhinitis.  Recommend Flonase and Astelin.   RTC 4 months

## 2017-08-04 NOTE — Addendum Note (Signed)
Addended byDamita Dunnings D on: 08/04/2017 01:33 PM   Modules accepted: Orders

## 2017-08-05 ENCOUNTER — Other Ambulatory Visit: Payer: Self-pay | Admitting: Internal Medicine

## 2017-08-05 ENCOUNTER — Telehealth: Payer: Self-pay | Admitting: Internal Medicine

## 2017-08-05 NOTE — Telephone Encounter (Signed)
Copied from [place] [id number]. Topic: Quick Communication - Rx Refill/Question >> Aug 05, 2017  3:30 PM Oliver Pila B wrote: Pt called to have the nurse of his pcp to call in a glucose meter for him @ his pharmacy, contact pt to advise

## 2017-08-05 NOTE — Telephone Encounter (Signed)
Pt requesting Rx for glucose meter to be sent to [facility] on file. Glucose meter is not currently listed on pt's medication list.   LOV: 07/30/17  Dr. Madelaine Et[facility]

## 2017-08-06 NOTE — Telephone Encounter (Signed)
Pt to see endo- they will prescribe glucometer.

## 2017-08-06 NOTE — Telephone Encounter (Signed)
Will you discuss need to see endo- I spoke last week w/ Pt's daughter.

## 2017-08-06 NOTE — Telephone Encounter (Signed)
Patient stated he does not see an Endocrinologist.  So he needs the prescription called in by [name].

## 2017-08-08 NOTE — Telephone Encounter (Signed)
I spoke with [name],  patient's daughter.  I explained that his  blood sugar is very high and he needs to see Endo. We agreed that the endocrinologist office will call her directly and she will take her father down there. Please arrange

## 2017-08-11 NOTE — Telephone Encounter (Signed)
Gwen- can you let the ENDO office know that Pt's daughter has agreed to take Pt there. They will need to contact her regarding scheduling appts. Leigh's number is [phone].

## 2017-08-19 ENCOUNTER — Encounter: Payer: Self-pay | Admitting: Internal Medicine

## 2017-08-19 DIAGNOSIS — L570 Actinic keratosis: Secondary | ICD-10-CM | POA: Diagnosis not present

## 2017-08-19 DIAGNOSIS — H61022 Chronic perichondritis of left external ear: Secondary | ICD-10-CM | POA: Diagnosis not present

## 2017-08-25 DIAGNOSIS — L570 Actinic keratosis: Secondary | ICD-10-CM | POA: Diagnosis not present

## 2017-09-20 ENCOUNTER — Other Ambulatory Visit: Payer: Self-pay | Admitting: Internal Medicine

## 2017-10-01 ENCOUNTER — Other Ambulatory Visit: Payer: Self-pay | Admitting: Internal Medicine

## 2017-10-24 ENCOUNTER — Other Ambulatory Visit: Payer: Self-pay | Admitting: Internal Medicine

## 2017-11-05 ENCOUNTER — Ambulatory Visit (INDEPENDENT_AMBULATORY_CARE_PROVIDER_SITE_OTHER): Payer: 59 | Admitting: Endocrinology

## 2017-11-05 ENCOUNTER — Encounter: Payer: Self-pay | Admitting: Endocrinology

## 2017-11-05 VITALS — BP 158/78 | HR 79 | Ht 67.0 in | Wt 169.2 lb

## 2017-11-05 DIAGNOSIS — E118 Type 2 diabetes mellitus with unspecified complications: Secondary | ICD-10-CM | POA: Diagnosis not present

## 2017-11-05 LAB — POCT GLYCOSYLATED HEMOGLOBIN (HGB A1C): HEMOGLOBIN A1C: 9.2 % — AB (ref 4.0–5.6)

## 2017-11-05 MED ORDER — GLUCOSE BLOOD VI STRP: 1.0000 | ORAL_STRIP | Freq: Two times a day (BID) | 3 refills | Status: DC

## 2017-11-05 MED ORDER — INSULIN GLARGINE 100 UNIT/ML SOLOSTAR PEN: 50.0000 [IU] | PEN_INJECTOR | SUBCUTANEOUS | 11 refills | Status: DC

## 2017-11-05 NOTE — Progress Notes (Signed)
Subjective:    Patient ID: [name], male    DOB: 09/19/1925, 84 y.o.   MRN: [id number]  HPI pt is referred by Dr Larose Kells, for diabetes.  Pt states DM was dx'ed in 2008; he has mild if any neuropathy of the lower extremities, but he has associated CAD; he has been on insulin since 2014; pt says his diet is good, but exercise is limited by falls; he has never had pancreatitis, pancreatic surgery, severe hypoglycemia or DKA.  He takes lantus and [place].  He gives his own insulin, but does not check cbg's.   Past Medical History:  Diagnosis Date  . AAA (abdominal aortic aneurysm) ([place])   . Anemia due to chronic blood loss 11/25/2008   Recurrent over the years EGD and colonoscopy x 2 each 2004 and 2010 without cause    . Atrial fibrillation ([place])   . BPH (benign prostatic hyperplasia)    reports aprocedure (TURP) remotely in [place].Marland KitchenNo futher f/u w/ urology  . CAD (coronary artery disease)   . Diabetes mellitus, type 2 ([place])   . Diverticulosis    left colon  . Erosive gastritis   . Fall 08/08/2016   OUTSIDE AT HOME FACIAL TRAUMA   . GERD (gastroesophageal reflux disease)   . Hyperlipidemia   . Hypertension   . Hypothyroidism   . Insomnia    transient  . Osteopenia    dexa 2-11  . Vitamin B12 deficiency     Past Surgical History:  Procedure Laterality Date  . CARDIAC CATHETERIZATION  01/31/06, 09/25/10, 09-2011  . CATARACT EXTRACTION     right  . CHOLECYSTECTOMY, LAPAROSCOPIC    . COLONOSCOPY     multiple  . CORONARY ARTERY BYPASS GRAFT     1999 stents in 2000  . ESOPHAGOGASTRODUODENOSCOPY     multiple  . GIVENS CAPSULE STUDY N/A 11/04/2012   Procedure: GIVENS CAPSULE STUDY;  Surgeon: Gatha Mayer, MD;  Location: [facility] ENDOSCOPY;  Service: Endoscopy;  Laterality: N/A;  . inguinal herniorrhaphies     bilateral  . LEFT HEART CATHETERIZATION WITH CORONARY ANGIOGRAM N/A 10/04/2011   Procedure: LEFT HEART CATHETERIZATION WITH CORONARY ANGIOGRAM;  Surgeon: Burnell Blanks, MD;   Location: [facility] CATH LAB;  Service: Cardiovascular;  Laterality: N/A;  . pacemaker  [place], 2013   medtronic minix 8341  . PENILE PROSTHESIS IMPLANT  1992  . PERMANENT PACEMAKER GENERATOR CHANGE N/A 06/05/2011   Procedure: PERMANENT PACEMAKER GENERATOR CHANGE;  Surgeon: Evans Lance, MD;  Location: [facility] CATH LAB;  Service: Cardiovascular;  Laterality: N/A;  . PROSTATECTOMY     transurethral  . right hip replacement    . TRANSTHORACIC ECHOCARDIOGRAM  12/2006    Social History   Socioeconomic History  . Marital status: Legally Separated    Spouse name: Not on file  . Number of children: 5  . Years of education: Not on file  . Highest education level: Not on file  Occupational History  . Occupation: retired    Fish farm manager: RETIRED  Social Needs  . Financial resource strain: Not on file  . Food insecurity:    Worry: Not on file    Inability: Not on file  . Transportation needs:    Medical: Not on file    Non-medical: Not on file  Tobacco Use  . Smoking status: Never Smoker  . Smokeless tobacco: Never Used  Substance and Sexual Activity  . Alcohol use: Yes    Alcohol/week: 0.6 - 1.2 oz  Types: 1 - 2 Glasses of wine per week    Comment: socially   . Drug use: No  . Sexual activity: Never  Lifestyle  . Physical activity:    Days per week: Not on file    Minutes per session: Not on file  . Stress: Not on file  Relationships  . Social connections:    Talks on phone: Not on file    Gets together: Not on file    Attends religious service: Not on file    Active member of club or organization: Not on file    Attends meetings of clubs or organizations: Not on file    Relationship status: Not on file  . Intimate partner violence:    Fear of current or ex partner: Not on file    Emotionally abused: Not on file    Physically abused: Not on file    Forced sexual activity: Not on file  Other Topics Concern  . Not on file  Social History Narrative   Lives by self, independent  on ADL, retired. Separated from wife.    Daughters:   [name] ( lives in [place]) [phone]   [name] ( lives out of town)  [phone]          Current Outpatient Medications on File Prior to Visit  Medication Sig Dispense Refill  . acetaminophen (TYLENOL) 325 MG tablet Take 650 mg by mouth every 6 (six) hours as needed for mild pain or moderate pain.     [name] amoxicillin (AMOXIL) 500 MG capsule Take 4 capsules (2,000 mg total) by mouth See admin instructions. ONE HOUR PRIOR TO [facility] DENTAL APPOINTMENT (Patient not taking: Reported on 07/30/2017) 10 capsule 0  . apixaban (ELIQUIS) 5 MG TABS tablet Take 1 tablet (5 mg total) by mouth 2 (two) times daily. 180 tablet 1  . azelastine (ASTELIN) 0.1 % nasal spray Place 2 sprays into both nostrils at bedtime as needed for rhinitis or allergies. Use in each nostril as directed 30 mL 12  . digoxin (LANOXIN) 0.125 MG tablet Take 1 tablet (0.125 mg total) by mouth every other day. 45 tablet 3  . escitalopram (LEXAPRO) 10 MG tablet Take 1 tablet (10 mg total) by mouth daily. 90 tablet 2  . fenofibrate micronized (LOFIBRA) 134 MG capsule Take 1 capsule (134 mg total) by mouth daily. 90 capsule 1  . latanoprost (XALATAN) 0.005 % ophthalmic solution Place 1 drop into both eyes at bedtime.    [name] levothyroxine (SYNTHROID, LEVOTHROID) 88 MCG tablet Take 1 tablet (88 mcg total) by mouth daily before breakfast. 90 tablet 2  . metoprolol tartrate (LOPRESSOR) 25 MG tablet Take 1 tablet (25 mg total) by mouth 2 (two) times daily. 180 tablet 1  . mirtazapine (REMERON) 15 MG tablet Take 1 tablet (15 mg total) by mouth at bedtime. 90 tablet 1  . nitroGLYCERIN (NITROSTAT) 0.4 MG SL tablet Place 1 tablet (0.4 mg total) under the tongue every 5 (five) minutes x 3 doses as needed for chest pain. Then contact 911 or go to ER 25 tablet 3  . omeprazole (PRILOSEC) 40 MG capsule Take 1 capsule (40 mg total) by mouth daily. 90 capsule 3  . pantoprazole (PROTONIX) 40 MG tablet Take 1  tablet (40 mg total) by mouth daily before breakfast. 90 tablet 3  . pravastatin (PRAVACHOL) 40 MG tablet Take 2 tablets (80 mg total) by mouth daily. 180 tablet 1  . sitaGLIPtin (JANUVIA) 100 MG tablet Take 1 tablet (  100 mg total) by mouth daily. 90 tablet 1  . triamcinolone ointment (KENALOG) 0.1 %     . vitamin B-12 (CYANOCOBALAMIN) 1000 MCG tablet Take 1 tablet (1,000 mcg total) by mouth daily. 90 tablet 2   No current facility-administered medications on file prior to visit.     Allergies  Allergen Reactions  . Ace Inhibitors Cough  . Codeine Phosphate Nausea Only  . Hydrochlorothiazide W-Triamterene Other (See Comments)    CRAMPING    Family History  Problem Relation Age of Onset  . Alzheimer's disease Mother   . Heart failure Father   . CVA Father   . Coronary artery disease Brother        cabg  . Alzheimer's disease Sister   . Mental illness Sister   . Alzheimer's disease Sister   . Other Sister        lung problems  . Diabetes Other        several siblings  . Prostate cancer Neg Hx   . Colon cancer Neg Hx   . Esophageal cancer Neg Hx   . Stomach cancer Neg Hx   . Rectal cancer Neg Hx     BP (!) 158/78 (BP Location: Left Arm, Patient Position: Sitting, Cuff Size: Normal)   Pulse 79   Ht 5\' 7"  (1.702 m)   Wt 169 lb 3.2 oz (76.7 kg)   SpO2 93%   BMI 26.50 kg/m     Review of Systems denies weight loss, blurry vision, headache, chest pain, sob, n/v, urinary frequency, muscle cramps, excessive diaphoresis, memory loss, depression, and cold intolerance.  He has rhinorrhea and easy bruising.       Objective:   Physical Exam VS: see vs page GEN: no distress HEAD: head: no deformity eyes: no periorbital swelling, no proptosis external nose and ears are normal mouth: no lesion seen NECK: supple, thyroid is not enlarged CHEST WALL: no deformity.  Old healed surgical scar (median sternotomy).  Pacemaker at the right upper ant chest wall.  LUNGS: clear to  auscultation CV: reg rate and rhythm, no murmur ABD: abdomen is soft, nontender.  no hepatosplenomegaly.  not distended.  no hernia MUSCULOSKELETAL: muscle bulk and strength are grossly normal.  no obvious joint swelling.  gait is slow but steady EXTEMITIES: no deformity.  no ulcer on the feet.  feet are of normal color and temp.  no edema.  Old healed surgical scars on the right leg (vein harvest).  There is bilateral onychomycosis of the toenails.  PULSES: dorsalis pedis intact bilat.  no carotid bruit NEURO:  cn 2-12 grossly intact, except for hering loss.   readily moves all 4's.  sensation is intact to touch on the feet SKIN:  Normal texture and temperature.  No rash or suspicious lesion is visible.  Ecchymoses at the forearms.   NODES:  None palpable at the neck.   PSYCH: alert, well-oriented.  Does not appear anxious nor depressed.  Lab Results  Component Value Date   CREATININE 0.94 07/07/2017   BUN 14 07/07/2017   NA 132 (L) 07/07/2017   K 4.4 07/07/2017   CL 101 07/07/2017   CO2 24 07/07/2017     Lab Results  Component Value Date   HGBA1C 9.2 (A) 11/05/2017   I have reviewed outside records, and summarized: Pt was noted to have elevated a1c, and referred here.  Other probs addressed were depression and chest pain      Assessment & Plan:  Insulin-requiring type 2  DM, with CAD, new to me: he needs increased rx CAD: in view of this and advanced age, we'll get some cbg info before before adjusting insulin  Patient Instructions  good diet and exercise significantly improve the control of your diabetes.  please let me know if you wish to be referred to a dietician.  high blood sugar is very risky to your health.  you should see an eye doctor and dentist every year.  It is very important to get all recommended vaccinations.   Controlling your blood pressure and cholesterol drastically reduces the damage diabetes does to your body.  Those who smoke should quit.  Please discuss  these with your doctor.   check your blood sugar twice a day.  vary the time of day when you check, between before the 3 meals, and at bedtime.  also check if you have symptoms of your blood sugar being too high or too low.  please keep a record of the readings and bring it to your next appointment here (or you can bring the meter itself).  You can write it on any piece of paper.  please call us sooner if your blood sugar goes below 70, or if you have a lot of readings over 200. Here is a new meter.  I have sent a prescription to your pharmacy, for strips.   Please call or message [place] next week, to tell us how the blood sugar is doing.   We will need to take this complex situation in stages.   Please come back for a follow-up appointment in 1 month.

## 2017-11-05 NOTE — Patient Instructions (Signed)
good diet and exercise significantly improve the control of your diabetes.  please let me know if you wish to be referred to a dietician.  high blood sugar is very risky to your health.  you should see an eye doctor and dentist every year.  It is very important to get all recommended vaccinations.   Controlling your blood pressure and cholesterol drastically reduces the damage diabetes does to your body.  Those who smoke should quit.  Please discuss these with your doctor.   check your blood sugar twice a day.  vary the time of day when you check, between before the 3 meals, and at bedtime.  also check if you have symptoms of your blood sugar being too high or too low.  please keep a record of the readings and bring it to your next appointment here (or you can bring the meter itself).  You can write it on any piece of paper.  please call us sooner if your blood sugar goes below 70, or if you have a lot of readings over 200. Here is a new meter.  I have sent a prescription to your pharmacy, for strips.   Please call or message [place] next week, to tell us how the blood sugar is doing.   We will need to take this complex situation in stages.   Please come back for a follow-up appointment in 1 month.

## 2017-11-06 ENCOUNTER — Other Ambulatory Visit: Payer: Self-pay

## 2017-11-06 ENCOUNTER — Telehealth: Payer: Self-pay | Admitting: Endocrinology

## 2017-11-06 MED ORDER — GLUCOSE BLOOD VI STRP: 1.0000 | ORAL_STRIP | Freq: Two times a day (BID) | 3 refills | Status: DC

## 2017-11-06 MED ORDER — INSULIN PEN NEEDLE 31G X 6 MM MISC: 12 refills | Status: AC

## 2017-11-06 NOTE — Telephone Encounter (Signed)
I have sent in with DX code.

## 2017-11-06 NOTE — Telephone Encounter (Signed)
[place] ph# [phone] needs to speak with someone re: test strips-she faxed form over yesterday to have filled out/filled in-or if sent electronically it needs a diagnosis code

## 2017-11-06 NOTE — Telephone Encounter (Signed)
Patient need a new prescription for test strips and pen needles for the one touch verio.  [place], [place]

## 2017-11-06 NOTE — Telephone Encounter (Signed)
I have sent to patient;'s pharmacy.  

## 2017-11-07 ENCOUNTER — Other Ambulatory Visit: Payer: Self-pay

## 2017-11-07 MED ORDER — GLUCOSE BLOOD VI STRP: 1.0000 | ORAL_STRIP | Freq: Two times a day (BID) | 3 refills | Status: DC

## 2017-11-10 ENCOUNTER — Telehealth: Payer: Self-pay | Admitting: Endocrinology

## 2017-11-10 ENCOUNTER — Other Ambulatory Visit: Payer: Self-pay

## 2017-11-10 MED ORDER — GLUCOSE BLOOD VI STRP: ORAL_STRIP | 12 refills | Status: AC

## 2017-11-10 MED ORDER — ACCU-CHEK FASTCLIX LANCETS MISC: 11 refills | Status: DC

## 2017-11-10 MED ORDER — ACCU-CHEK GUIDE W/DEVICE KIT: 1.0000 | PACK | Freq: Every day | 0 refills | Status: AC

## 2017-11-10 NOTE — Telephone Encounter (Signed)
I have spoken with [name] sent in new prescription for accu chek which insurance prefers.

## 2017-11-10 NOTE — Telephone Encounter (Signed)
[facility] is requesting a call back to discuss patients test strips  [phone]

## 2017-11-17 ENCOUNTER — Telehealth: Payer: Self-pay | Admitting: Emergency Medicine

## 2017-11-17 NOTE — Telephone Encounter (Signed)
a1c was high, so I need more info about how cbg is doing (range, and any possible trend throughout the day).  Thank you.

## 2017-11-17 NOTE — Telephone Encounter (Signed)
Patient called and waited to inform you his average blood sugar for the week was 150. Thanks.

## 2017-11-17 NOTE — Telephone Encounter (Signed)
I called patient & he stated that he didn't have this information. I asked that he would check & write down readings then all [place] back to let us know in a few days. He stated that he would do so, so we would know BS trends.

## 2017-11-19 ENCOUNTER — Telehealth: Payer: Self-pay | Admitting: Emergency Medicine

## 2017-11-19 NOTE — Telephone Encounter (Signed)
Please ask him to decrease Lantus to 40 units and check back with use is 2-3 days.

## 2017-11-19 NOTE — Telephone Encounter (Signed)
Pt called stating Dr Loanne Drilling wanted his blood sugar readings for a few day. They are as followed:  11/17/17    6:43pm     56 11/18/17    10:19am   212 11/18/17    3:30pm     74 11/18/17    7:51pm     103 11/19/17   6:55am     77

## 2017-11-24 DIAGNOSIS — L430 Hypertrophic lichen planus: Secondary | ICD-10-CM | POA: Diagnosis not present

## 2017-11-24 DIAGNOSIS — D485 Neoplasm of uncertain behavior of skin: Secondary | ICD-10-CM | POA: Diagnosis not present

## 2017-11-24 DIAGNOSIS — L814 Other melanin hyperpigmentation: Secondary | ICD-10-CM | POA: Diagnosis not present

## 2017-11-24 DIAGNOSIS — D225 Melanocytic nevi of trunk: Secondary | ICD-10-CM | POA: Diagnosis not present

## 2017-11-24 DIAGNOSIS — D0462 Carcinoma in situ of skin of left upper limb, including shoulder: Secondary | ICD-10-CM | POA: Diagnosis not present

## 2017-11-24 DIAGNOSIS — L821 Other seborrheic keratosis: Secondary | ICD-10-CM | POA: Diagnosis not present

## 2017-11-24 DIAGNOSIS — L570 Actinic keratosis: Secondary | ICD-10-CM | POA: Diagnosis not present

## 2017-11-25 NOTE — Telephone Encounter (Signed)
Have attempted to reach patient but have LVM requesting patient call back to discuss instructions

## 2017-12-01 DIAGNOSIS — D0462 Carcinoma in situ of skin of left upper limb, including shoulder: Secondary | ICD-10-CM | POA: Diagnosis not present

## 2017-12-03 NOTE — Progress Notes (Addendum)
Subjective:   [name] is a 84 y.o. male who presents for Medicare Annual/Subsequent preventive examination.  Review of Systems: No ROS.  Medicare Wellness Visit. Additional risk factors are reflected in the social history. Cardiac Risk Factors include: advanced age (>5mn, >>69women);diabetes mellitus;dyslipidemia;hypertension;male gender;sedentary lifestyle Sleep patterns:generally no issues. Home Safety/Smoke Alarms: Feels safe in home. Smoke alarms in place.  Living environment[place] Dtr.Lives in the the same community. Wears safety necklace. Eyes- Hx cataract sx. [name] yearly. Has appt Oct 1st  Male:    PSA-  Lab Results  Component Value Date   PSA 2.59 02/28/2014   PSA 2.20 05/23/2010   PSA 2.70 06/07/2009       Objective:    Vitals: BP 126/68 (BP Location: Left Arm, Patient Position: Sitting, Cuff Size: Normal)   Pulse 77   Ht '5\' 7"'  (1.702 m)   Wt 168 lb (76.2 kg)   SpO2 96%   BMI 26.31 kg/m   Body mass index is 26.31 kg/m.  Advanced Directives 12/08/2017 07/07/2017 02/14/2017 12/09/2016 11/29/2016 08/28/2016 08/13/2016  Does Patient Have a Medical Advance Directive? Yes No Yes Yes No No No  Type of [place]amed[place] will - - [place] CenterLiving will - - -  Does patient want to make changes to medical advance directive? No - Patient declined - - - - - -  Copy of [place] Chart? No - copy requested - - No - copy requested - - -  Would patient like information on creating a medical advance directive? - - - - No - Patient declined - -  Pre-existing out of facility DNR order (yellow form or pink MOST form) - - - - - - -    Tobacco Social History   Tobacco Use  Smoking Status Never Smoker  Smokeless Tobacco Never Used     Counseling given: Not Answered   Clinical Intake:     Pain : No/denies pain                 Past  Medical History:  Diagnosis Date  . AAA (abdominal aortic aneurysm) ([place]   . Anemia due to chronic blood loss 11/25/2008   Recurrent over the years EGD and colonoscopy x 2 each 2004 and 2010 without cause    . Atrial fibrillation ([place]   . BPH (benign prostatic hyperplasia)    reports aprocedure (TURP) remotely in [place]..Marland Kitcheno futher f/u w/ urology  . CAD (coronary artery disease)   . Diabetes mellitus, type 2 ([place]   . Diverticulosis    left colon  . Erosive gastritis   . Fall 08/08/2016   OUTSIDE AT HOME FACIAL TRAUMA   . GERD (gastroesophageal reflux disease)   . Hyperlipidemia   . Hypertension   . Hypothyroidism   . Insomnia    transient  . Osteopenia    dexa 2-11  . Vitamin B12 deficiency    Past Surgical History:  Procedure Laterality Date  . CARDIAC CATHETERIZATION  01/31/06, 09/25/10, 09-2011  . CATARACT EXTRACTION     right  . CHOLECYSTECTOMY, LAPAROSCOPIC    . COLONOSCOPY     multiple  . CORONARY ARTERY BYPASS GRAFT     1999 stents in 2000  . ESOPHAGOGASTRODUODENOSCOPY     multiple  . GIVENS CAPSULE STUDY N/A 11/04/2012   Procedure: GIVENS CAPSULE STUDY;  Surgeon: CGatha Mayer MD;  Location: [facility];  Service: Endoscopy;  Laterality: N/A;  . inguinal herniorrhaphies     bilateral  . LEFT HEART CATHETERIZATION WITH CORONARY ANGIOGRAM N/A 10/04/2011   Procedure: LEFT HEART CATHETERIZATION WITH CORONARY ANGIOGRAM;  Surgeon: Burnell Blanks, MD;  Location: [facility] CATH LAB;  Service: Cardiovascular;  Laterality: N/A;  . pacemaker  [place], 2013   medtronic minix [id number]  . PENILE PROSTHESIS IMPLANT  1992  . PERMANENT PACEMAKER GENERATOR CHANGE N/A 06/05/2011   Procedure: PERMANENT PACEMAKER GENERATOR CHANGE;  Surgeon: Evans Lance, MD;  Location: [facility] CATH LAB;  Service: Cardiovascular;  Laterality: N/A;  . PROSTATECTOMY     transurethral  . right hip replacement    . TRANSTHORACIC ECHOCARDIOGRAM  12/2006   Family History  Problem Relation Age of Onset  .  Alzheimer's disease Mother   . Heart failure Father   . CVA Father   . Coronary artery disease Brother        cabg  . Alzheimer's disease Sister   . Mental illness Sister   . Alzheimer's disease Sister   . Other Sister        lung problems  . Diabetes Other        several siblings  . Prostate cancer Neg Hx   . Colon cancer Neg Hx   . Esophageal cancer Neg Hx   . Stomach cancer Neg Hx   . Rectal cancer Neg Hx    Social History   Socioeconomic History  . Marital status: Legally Separated    Spouse name: Not on file  . Number of children: 5  . Years of education: Not on file  . Highest education level: Not on file  Occupational History  . Occupation: retired    Fish farm manager: RETIRED  Social Needs  . Financial resource strain: Not on file  . Food insecurity:    Worry: Not on file    Inability: Not on file  . Transportation needs:    Medical: Not on file    Non-medical: Not on file  Tobacco Use  . Smoking status: Never Smoker  . Smokeless tobacco: Never Used  Substance and Sexual Activity  . Alcohol use: Yes    Alcohol/week: 0.6 - 1.2 oz    Types: 1 - 2 Glasses of wine per week    Comment: socially   . Drug use: No  . Sexual activity: Not Currently  Lifestyle  . Physical activity:    Days per week: Not on file    Minutes per session: Not on file  . Stress: Not on file  Relationships  . Social connections:    Talks on phone: Not on file    Gets together: Not on file    Attends religious service: Not on file    Active member of club or organization: Not on file    Attends meetings of clubs or organizations: Not on file    Relationship status: Not on file  Other Topics Concern  . Not on file  Social History Narrative   Lives by self, independent on ADL, retired. Separated from wife.    Daughters:   [name] ( lives in [place]) [phone]   [name] ( lives out of town)  [phone]          Outpatient Encounter Medications as of 12/08/2017  Medication Sig  .  ACCU-CHEK FASTCLIX LANCETS MISC Used to check blood sugars 2x daily.  [name] acetaminophen (TYLENOL) 325 MG tablet Take 650 mg by mouth every 6 (six) hours as  needed for mild pain or moderate pain.   [name] amoxicillin (AMOXIL) 500 MG capsule Take 4 capsules (2,000 mg total) by mouth See admin instructions. ONE HOUR PRIOR TO [facility] DENTAL APPOINTMENT (Patient not taking: Reported on 07/30/2017)  . apixaban (ELIQUIS) 5 MG TABS tablet Take 1 tablet (5 mg total) by mouth 2 (two) times daily.  [name] azelastine (ASTELIN) 0.1 % nasal spray Place 2 sprays into both nostrils at bedtime as needed for rhinitis or allergies. Use in each nostril as directed  . Blood Glucose Monitoring Suppl (ACCU-CHEK GUIDE) w/Device KIT 1 Device by Does not apply route daily.  . digoxin (LANOXIN) 0.125 MG tablet Take 1 tablet (0.125 mg total) by mouth every other day.  . escitalopram (LEXAPRO) 10 MG tablet Take 1 tablet (10 mg total) by mouth daily.  . fenofibrate micronized (LOFIBRA) 134 MG capsule Take 1 capsule (134 mg total) by mouth daily.  [name] glucose blood (ACCU-CHEK GUIDE) test strip Used to check blood sugars 2x daily.  . Insulin Glargine (LANTUS SOLOSTAR) 100 UNIT/ML Solostar Pen Inject 50 Units into the skin every morning. (Patient taking differently: Inject 40 Units into the skin every morning. )  . Insulin Pen Needle 31G X 6 MM MISC To use w/ Lantus  . latanoprost (XALATAN) 0.005 % ophthalmic solution Place 1 drop into both eyes at bedtime.  [name] levothyroxine (SYNTHROID, LEVOTHROID) 88 MCG tablet Take 1 tablet (88 mcg total) by mouth daily before breakfast.  . metoprolol tartrate (LOPRESSOR) 25 MG tablet Take 1 tablet (25 mg total) by mouth 2 (two) times daily.  . mirtazapine (REMERON) 15 MG tablet Take 1 tablet (15 mg total) by mouth at bedtime.  . nitroGLYCERIN (NITROSTAT) 0.4 MG SL tablet Place 1 tablet (0.4 mg total) under the tongue every 5 (five) minutes x 3 doses as needed for chest pain. Then contact 911 or go to ER (Patient  not taking: Reported on 12/08/2017)  . omeprazole (PRILOSEC) 40 MG capsule Take 1 capsule (40 mg total) by mouth daily.  . pantoprazole (PROTONIX) 40 MG tablet Take 1 tablet (40 mg total) by mouth daily before breakfast.  . pravastatin (PRAVACHOL) 40 MG tablet Take 2 tablets (80 mg total) by mouth daily.  . sitaGLIPtin (JANUVIA) 100 MG tablet Take 1 tablet (100 mg total) by mouth daily.  [name] triamcinolone ointment (KENALOG) 0.1 %   . vitamin B-12 (CYANOCOBALAMIN) 1000 MCG tablet Take 1 tablet (1,000 mcg total) by mouth daily.   No facility-administered encounter medications on file as of 12/08/2017.     Activities of Daily Living In your present state of health, do you have any difficulty performing the following activities: 12/08/2017 12/09/2016  Hearing? N Y  Comment - pt states he has hearing aids  Vision? N N  Difficulty concentrating or making decisions? N N  Walking or climbing stairs? Y Y  Dressing or bathing? Y N  Doing errands, shopping? Y Y  Comment - Daughter drove pt.   Preparing Food and eating ? N N  Using the Toilet? N N  In the past six months, have you accidently leaked urine? N N  Do you have problems with loss of bowel control? N N  Managing your Medications? N N  Managing your Finances? N N  Housekeeping or managing your Housekeeping? Y N  Comment dtr does laundry and trash. -  Some recent data might be hidden    Patient Care Team: Colon Branch, MD as PCP - Arvil Persons, Vernelle Emerald, MD  as Consulting Physician (Internal Medicine) Ditty, Kevan Ny, MD as Consulting Physician (Neurosurgery)   Assessment:   This is a routine wellness examination for Weaver. Physical assessment deferred to PCP.  Exercise Activities and Dietary recommendations Current Exercise Habits: The patient does not participate in regular exercise at present, Exercise limited by: None identified Diet (meal preparation, eat out, water intake, caffeinated beverages, dairy products, fruits and  vegetables): Pt states he normally just eats twice per day. Cereal for breakfast. Dinner varies. Normally has a sandwich and piece of fruit unless someone brings dinner.     Goals    . Maintain independence       Fall Risk Fall Risk  12/08/2017 12/09/2016 09/27/2015 02/28/2014 07/27/2013  Falls in the past year? Yes Yes Yes Yes No  Number falls in past yr: '1 1 1 1 ' -  Injury with Fall? Yes No No No -  Risk for fall due to : - Impaired balance/gait - Impaired balance/gait -  Follow up Education provided;Falls prevention discussed Education provided;Falls prevention discussed Falls evaluation completed - -    Depression Screen PHQ 2/9 Scores 12/09/2016 09/27/2015 02/28/2014 07/27/2013  PHQ - 2 Score 0 0 0 0    Cognitive Function MMSE - Mini Mental State Exam 12/08/2017 12/09/2016  Not completed: - Refused  Orientation to time 5 -  Orientation to Place 5 -  Registration 3 -  Attention/ Calculation 4 -  Recall 3 -  Language- name 2 objects 2 -  Language- repeat 1 -  Language- follow 3 step command 3 -  Language- read & follow direction 1 -  Write a sentence 1 -  Copy design 1 -  Total score 29 -        Immunization History  Administered Date(s) Administered  . H1N1 06/14/2008  . Influenza Split 03/01/2011  . Influenza Whole 03/31/2007, 02/04/2008, 02/03/2009, 04/13/2010  . Influenza, High Dose Seasonal PF 03/15/2013, 04/18/2016, 03/11/2017  . Influenza,inj,Quad PF,6+ Mos 02/28/2014  . PPD Test 08/13/2016  . Pneumococcal Conjugate-13 03/02/2014  . Pneumococcal Polysaccharide-23 03/31/2007, 12/08/2017  . Td 12/15/2007, 03/13/2017    Screening Tests Health Maintenance  Topic Date Due  . OPHTHALMOLOGY EXAM  03/06/1936  . INFLUENZA VACCINE  12/11/2017  . URINE MICROALBUMIN  03/11/2018  . HEMOGLOBIN A1C  05/07/2018  . FOOT EXAM  11/06/2018  . TETANUS/TDAP  03/14/2027  . PNA vac Low Risk Adult  Completed        Plan:    Please schedule your next medicare wellness visit  with me in 1 yr.  Continue to eat heart healthy diet (full of fruits, vegetables, whole grains, lean protein, water--limit salt, fat, and sugar intake) and increase physical activity as tolerated.  Continue doing brain stimulating activities (puzzles, reading, adult coloring books, staying active) to keep memory sharp.   Bring a copy of your living will and/or healthcare power of attorney to your next office visit.   I have personally reviewed and noted the following in the patient's chart:   . Medical and social history . Use of alcohol, tobacco or illicit drugs  . Current medications and supplements . Functional ability and status . Nutritional status . Physical activity . Advanced directives . List of other physicians . Hospitalizations, surgeries, and ER visits in previous 12 months . Vitals . Screenings to include cognitive, depression, and falls . Referrals and appointments  In addition, I have reviewed and discussed with patient certain preventive protocols, quality metrics, and best practice recommendations. A written  personalized care plan for preventive services as well as general preventive health recommendations were provided to patient.     Naaman Plummer [place], [place]  12/08/2017 Kathlene November, MD

## 2017-12-08 ENCOUNTER — Ambulatory Visit: Payer: Medicare HMO | Admitting: *Deleted

## 2017-12-08 ENCOUNTER — Encounter: Payer: Self-pay | Admitting: Internal Medicine

## 2017-12-08 ENCOUNTER — Ambulatory Visit (INDEPENDENT_AMBULATORY_CARE_PROVIDER_SITE_OTHER): Payer: Medicare HMO | Admitting: Internal Medicine

## 2017-12-08 ENCOUNTER — Encounter: Payer: Self-pay | Admitting: *Deleted

## 2017-12-08 VITALS — BP 126/68 | HR 77 | Temp 97.9°F | Resp 16 | Ht 67.0 in | Wt 168.2 lb

## 2017-12-08 VITALS — BP 126/68 | HR 77 | Ht 67.0 in | Wt 168.0 lb

## 2017-12-08 DIAGNOSIS — Z0000 Encounter for general adult medical examination without abnormal findings: Secondary | ICD-10-CM | POA: Diagnosis not present

## 2017-12-08 DIAGNOSIS — Z23 Encounter for immunization: Secondary | ICD-10-CM

## 2017-12-08 DIAGNOSIS — I4821 Permanent atrial fibrillation: Secondary | ICD-10-CM

## 2017-12-08 DIAGNOSIS — E785 Hyperlipidemia, unspecified: Secondary | ICD-10-CM | POA: Diagnosis not present

## 2017-12-08 DIAGNOSIS — E039 Hypothyroidism, unspecified: Secondary | ICD-10-CM

## 2017-12-08 DIAGNOSIS — I482 Chronic atrial fibrillation: Secondary | ICD-10-CM

## 2017-12-08 DIAGNOSIS — N189 Chronic kidney disease, unspecified: Secondary | ICD-10-CM | POA: Diagnosis not present

## 2017-12-08 NOTE — Progress Notes (Signed)
Subjective:    Patient ID: [name], male    DOB: 07/12/1925, 84 y.o.   MRN: [id number]  DOS:  12/08/2017 Type of visit - description : rov, here with his daughter Interval history: no major concerns Good med compliance. Saw endocrinology, CBGs in the 140s when checked.   Review of Systems Denies chest pain, difficulty breathing or lower extremity edema No nausea, vomiting, diarrhea. No blood in the stools  Past Medical History:  Diagnosis Date  . AAA (abdominal aortic aneurysm) ([place])   . Anemia due to chronic blood loss 11/25/2008   Recurrent over the years EGD and colonoscopy x 2 each 2004 and 2010 without cause    . Atrial fibrillation ([place])   . BPH (benign prostatic hyperplasia)    reports aprocedure (TURP) remotely in [place].Marland KitchenNo futher f/u w/ urology  . CAD (coronary artery disease)   . Diabetes mellitus, type 2 ([place])   . Diverticulosis    left colon  . Erosive gastritis   . Fall 08/08/2016   OUTSIDE AT HOME FACIAL TRAUMA   . GERD (gastroesophageal reflux disease)   . Hyperlipidemia   . Hypertension   . Hypothyroidism   . Insomnia    transient  . Osteopenia    dexa 2-11  . Vitamin B12 deficiency     Past Surgical History:  Procedure Laterality Date  . CARDIAC CATHETERIZATION  01/31/06, 09/25/10, 09-2011  . CATARACT EXTRACTION     right  . CHOLECYSTECTOMY, LAPAROSCOPIC    . COLONOSCOPY     multiple  . CORONARY ARTERY BYPASS GRAFT     1999 stents in 2000  . ESOPHAGOGASTRODUODENOSCOPY     multiple  . GIVENS CAPSULE STUDY N/A 11/04/2012   Procedure: GIVENS CAPSULE STUDY;  Surgeon: Gatha Mayer, MD;  Location: [facility];  Service: Endoscopy;  Laterality: N/A;  . inguinal herniorrhaphies     bilateral  . LEFT HEART CATHETERIZATION WITH CORONARY ANGIOGRAM N/A 10/04/2011   Procedure: LEFT HEART CATHETERIZATION WITH CORONARY ANGIOGRAM;  Surgeon: Burnell Blanks, MD;  Location: [facility] CATH LAB;  Service: Cardiovascular;  Laterality: N/A;  . pacemaker  [place], 2013   medtronic minix [id number]  . PENILE PROSTHESIS IMPLANT  1992  . PERMANENT PACEMAKER GENERATOR CHANGE N/A 06/05/2011   Procedure: PERMANENT PACEMAKER GENERATOR CHANGE;  Surgeon: Evans Lance, MD;  Location: [facility] CATH LAB;  Service: Cardiovascular;  Laterality: N/A;  . PROSTATECTOMY     transurethral  . right hip replacement    . TRANSTHORACIC ECHOCARDIOGRAM  12/2006    Social History   Socioeconomic History  . Marital status: Legally Separated    Spouse name: Not on file  . Number of children: 5  . Years of education: Not on file  . Highest education level: Not on file  Occupational History  . Occupation: retired    Fish farm manager: RETIRED  Social Needs  . Financial resource strain: Not on file  . Food insecurity:    Worry: Not on file    Inability: Not on file  . Transportation needs:    Medical: Not on file    Non-medical: Not on file  Tobacco Use  . Smoking status: Never Smoker  . Smokeless tobacco: Never Used  Substance and Sexual Activity  . Alcohol use: Yes    Alcohol/week: 0.6 - 1.2 oz    Types: 1 - 2 Glasses of wine per week    Comment: socially   . Drug use: No  . Sexual activity: Not Currently  Lifestyle  . Physical activity:    Days per week: Not on file    Minutes per session: Not on file  . Stress: Not on file  Relationships  . Social connections:    Talks on phone: Not on file    Gets together: Not on file    Attends religious service: Not on file    Active member of club or organization: Not on file    Attends meetings of clubs or organizations: Not on file    Relationship status: Not on file  . Intimate partner violence:    Fear of current or ex partner: Not on file    Emotionally abused: Not on file    Physically abused: Not on file    Forced sexual activity: Not on file  Other Topics Concern  . Not on file  Social History Narrative   Lives by self, independent on ADL, retired. Separated from wife.    Daughters:   [name] ( lives in  [place]) [phone]   [name] ( lives out of town)  [phone]            Allergies as of 12/08/2017      Reactions   Ace Inhibitors Cough   Codeine Phosphate Nausea Only   Hydrochlorothiazide W-triamterene Other (See Comments)   CRAMPING      Medication List        Accurate as of 12/08/17 11:59 PM. Always use your most recent med list.          ACCU-CHEK FASTCLIX LANCETS Misc Used to check blood sugars 2x daily.   ACCU-CHEK GUIDE w/Device Kit 1 Device by Does not apply route daily.   acetaminophen 325 MG tablet Commonly known as:  TYLENOL Take 650 mg by mouth every 6 (six) hours as needed for mild pain or moderate pain.   amoxicillin 500 MG capsule Commonly known as:  AMOXIL Take 4 capsules (2,000 mg total) by mouth See admin instructions. ONE HOUR PRIOR TO EACH DENTAL APPOINTMENT   apixaban 5 MG Tabs tablet Commonly known as:  ELIQUIS Take 1 tablet (5 mg total) by mouth 2 (two) times daily.   azelastine 0.1 % nasal spray Commonly known as:  ASTELIN Place 2 sprays into both nostrils at bedtime as needed for rhinitis or allergies. Use in each nostril as directed   digoxin 0.125 MG tablet Commonly known as:  LANOXIN Take 1 tablet (0.125 mg total) by mouth every other day.   escitalopram 10 MG tablet Commonly known as:  LEXAPRO Take 1 tablet (10 mg total) by mouth daily.   fenofibrate micronized 134 MG capsule Commonly known as:  LOFIBRA Take 1 capsule (134 mg total) by mouth daily.   glucose blood test strip Commonly known as:  ACCU-CHEK GUIDE Used to check blood sugars 2x daily.   Insulin Glargine 100 UNIT/ML Solostar Pen Commonly known as:  LANTUS SOLOSTAR Inject 50 Units into the skin every morning.   Insulin Pen Needle 31G X 6 MM Misc To use w/ Lantus   latanoprost 0.005 % ophthalmic solution Commonly known as:  XALATAN Place 1 drop into both eyes at bedtime.   levothyroxine 88 MCG tablet Commonly known as:  SYNTHROID, LEVOTHROID Take 1  tablet (88 mcg total) by mouth daily before breakfast.   metoprolol tartrate 25 MG tablet Commonly known as:  LOPRESSOR Take 1 tablet (25 mg total) by mouth 2 (two) times daily.   mirtazapine 15 MG tablet Commonly known as:  REMERON Take 1  tablet (15 mg total) by mouth at bedtime.   nitroGLYCERIN 0.4 MG SL tablet Commonly known as:  NITROSTAT Place 1 tablet (0.4 mg total) under the tongue every 5 (five) minutes x 3 doses as needed for chest pain. Then contact 911 or go to ER   omeprazole 40 MG capsule Commonly known as:  PRILOSEC Take 1 capsule (40 mg total) by mouth daily.   pantoprazole 40 MG tablet Commonly known as:  PROTONIX Take 1 tablet (40 mg total) by mouth daily before breakfast.   pravastatin 40 MG tablet Commonly known as:  PRAVACHOL Take 2 tablets (80 mg total) by mouth daily.   sitaGLIPtin 100 MG tablet Commonly known as:  JANUVIA Take 1 tablet (100 mg total) by mouth daily.   triamcinolone ointment 0.1 % Commonly known as:  KENALOG   vitamin B-12 1000 MCG tablet Commonly known as:  CYANOCOBALAMIN Take 1 tablet (1,000 mcg total) by mouth daily.          Objective:   Physical Exam BP 126/68 (BP Location: Right Arm, Patient Position: Sitting, Cuff Size: Small)   Pulse 77   Temp 97.9 F (36.6 C) (Oral)   Resp 16   Ht '5\' 7"'  (1.702 m)   Wt 168 lb 4 oz (76.3 kg)   SpO2 96%   BMI 26.35 kg/m  General:   Well developed, NAD, see BMI.  HEENT:  Normocephalic . Face symmetric, atraumatic Lungs:  CTA B Normal respiratory effort, no intercostal retractions, no accessory muscle use. Heart: RRR,  no murmur.  No pretibial edema bilaterally  Skin: Not pale. Not jaundice Neurologic:  alert & oriented X3.  Speech normal, gait appropriate for age and unassisted Psych--  Cognition and judgment appear intact.  Cooperative with normal attention span and concentration.  Behavior appropriate. No anxious or depressed appearing.      Assessment & Plan:    Assessment DM, + neuropahty per exam 12-2015 CRI Creat ~ 1.3-1.4 HTN Hyperlipidemia Hypothyroidism CV: --CAD -- Dr Harrington Challenger --Atrial fibrillation, PPM --- Dr Lovena Le --AAA: [place] 06-2014 stable, was rec no further imagine  GI: Erosive gastritis, GERD, h/o anemia d/t  chronic blood loss BPH NEURO:  07-2016: Fall, vaso vagal syncope? : SAH, eld asa-warfarin Osteopenia- declined dexa 2015 B 12 deficiency Skin cancer, sees derm  Plan: DM: On Januvia and Lantus, Per Endo, CBGs in average 140 per patient.  Was recommended to drop insulin to 40 units.  Patient aware. CRI: Check a BMP Hyperlipidemia: Continue lofibra-pravachol, check a FLP, AST, ALT Atrial fibrillation: PPM, anticoagulated, rate controlled, check a digoxin level. Hypothyroidism: On Synthroid, check a TSH. Will RTC fasting for labs Preventive care: PNM 23 today RTC 4-5 months

## 2017-12-08 NOTE — Patient Instructions (Signed)
Please schedule your next medicare wellness visit with me in 1 yr.  Continue to eat heart healthy diet (full of fruits, vegetables, whole grains, lean protein, water--limit salt, fat, and sugar intake) and increase physical activity as tolerated.  Continue doing brain stimulating activities (puzzles, reading, adult coloring books, staying active) to keep memory sharp.   Bring a copy of your living will and/or healthcare power of attorney to your next office visit.   [name] , Thank you for taking time to come for your Medicare Wellness Visit. I appreciate your ongoing commitment to your health goals. Please review the following plan we discussed and let me know if I can assist you in the future.   These are the goals we discussed: Goals    . Maintain independence       This is a list of the screening recommended for you and due dates:  Health Maintenance  Topic Date Due  . Eye exam for diabetics  03/06/1936  . Flu Shot  12/11/2017  . Urine Protein Check  03/11/2018  . Hemoglobin A1C  05/07/2018  . Complete foot exam   11/06/2018  . Tetanus Vaccine  03/14/2027  . Pneumonia vaccines  Completed

## 2017-12-08 NOTE — Progress Notes (Signed)
Pre visit review using our clinic review tool, if applicable. No additional management support is needed unless otherwise documented below in the visit note. 

## 2017-12-08 NOTE — Patient Instructions (Signed)
   GO TO THE FRONT DESK Labs to be done fasting for this week  Schedule your next appointment for a in 4 to 5 months   lantus : 40 units as recommended by endocrinology

## 2017-12-08 NOTE — Telephone Encounter (Signed)
Called pateint and explained note below he stated an understanding

## 2017-12-09 NOTE — Assessment & Plan Note (Signed)
Plan: DM: On Januvia and Lantus, Per Endo, CBGs in average 140 per patient.  Was recommended to drop insulin to 40 units.  Patient aware. CRI: Check a BMP Hyperlipidemia: Continue lofibra-pravachol, check a FLP, AST, ALT Atrial fibrillation: PPM, anticoagulated, rate controlled, check a digoxin level. Hypothyroidism: On Synthroid, check a TSH. Will RTC fasting for labs Preventive care: PNM 23 today RTC 4-5 months

## 2017-12-10 ENCOUNTER — Ambulatory Visit: Payer: Medicare HMO | Admitting: *Deleted

## 2017-12-10 IMAGING — CT CT HEAD W/O CM
1 series · 16 of 30 positions shown, 20 images · non-contrast
Comparison: 08/09/2016.

CLINICAL DATA: Continued surveillance subarachnoid hemorrhage.
Recent head trauma.

EXAM:
CT HEAD WITHOUT CONTRAST
TECHNIQUE: Contiguous axial images were obtained from the base of the skull
through the vertex without intravenous contrast.

[Series 2: head w/(date) · axial · 0.41mm/px · z∈[-157,-22]mm · 16 of 30 slices shown, 20 images]
[im 2/30  brain]
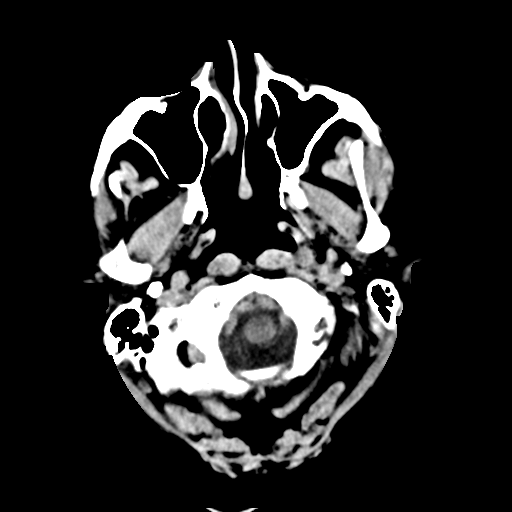
[im 2/30  bone]
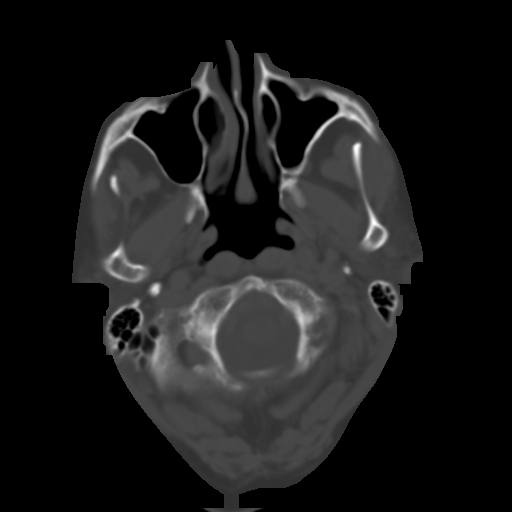
[im 4/30  brain]
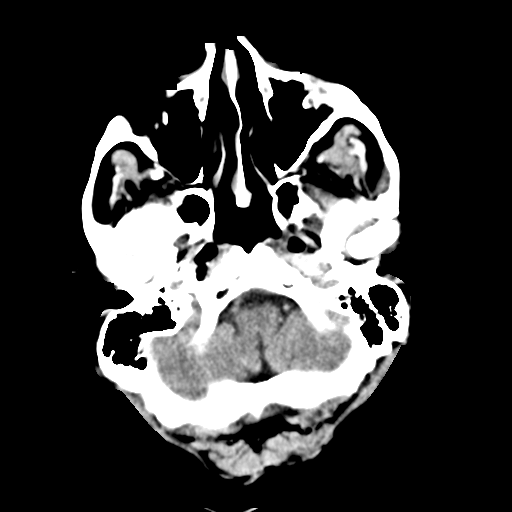
[im 6/30  brain]
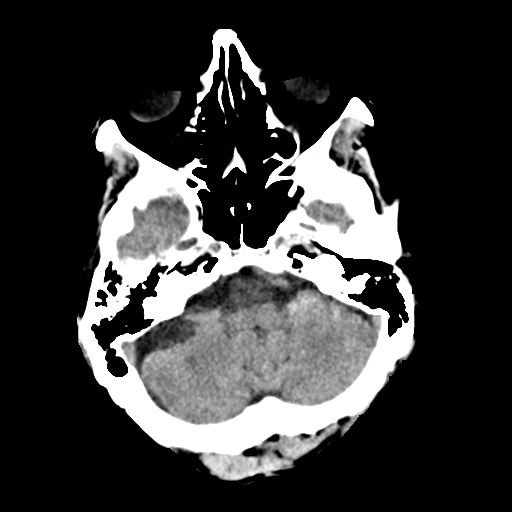
[im 8/30  brain]
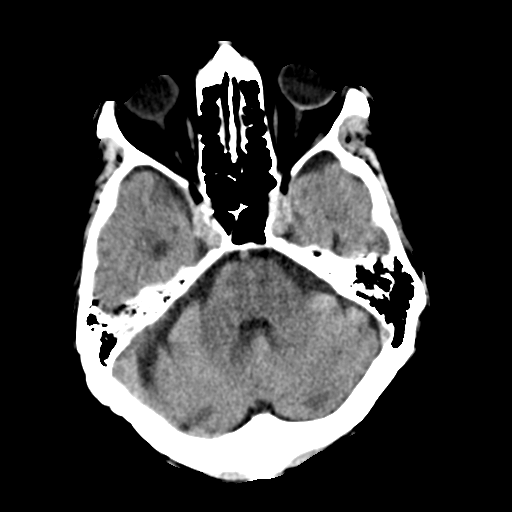
[im 9/30  brain]
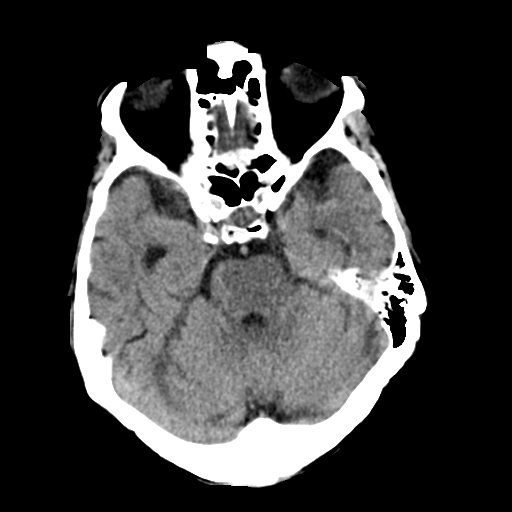
[im 9/30  bone]
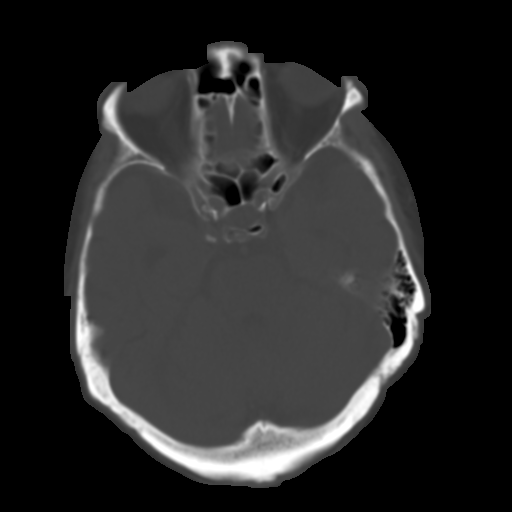
[im 11/30  brain]
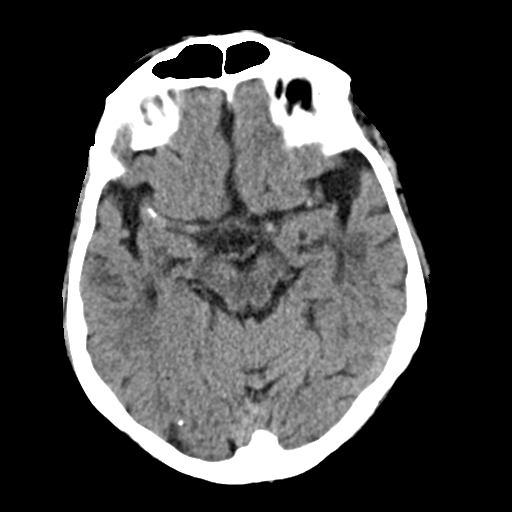
[im 13/30  brain]
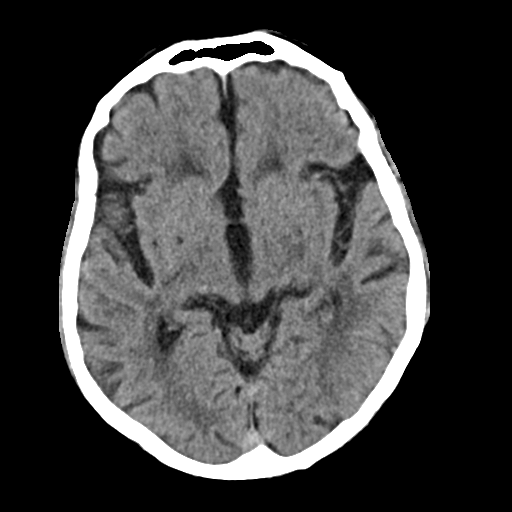
[im 15/30  brain]
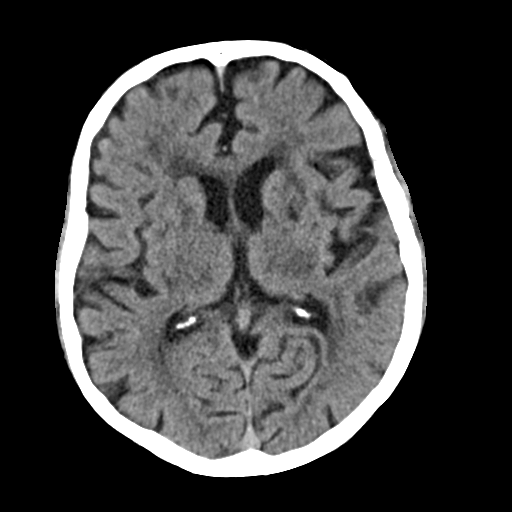
[im 16/30  brain]
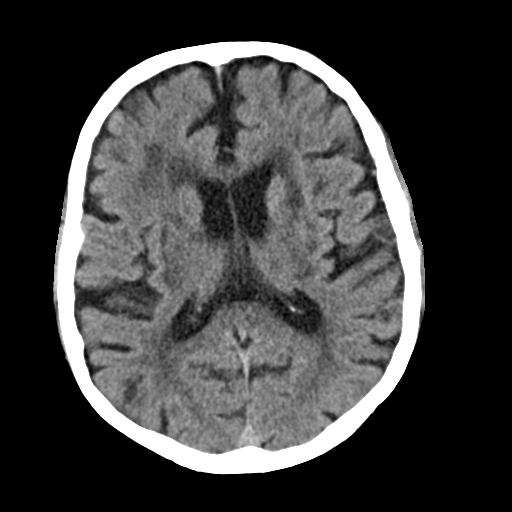
[im 16/30  bone]
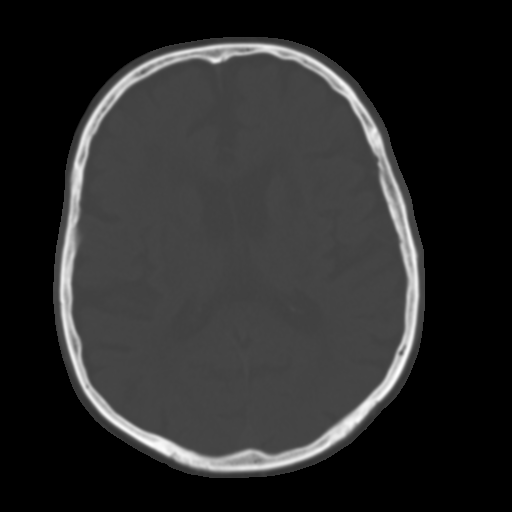
[im 18/30  brain]
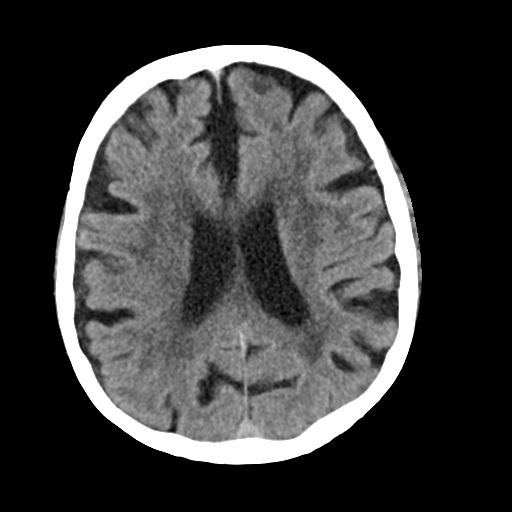
[im 20/30  brain]
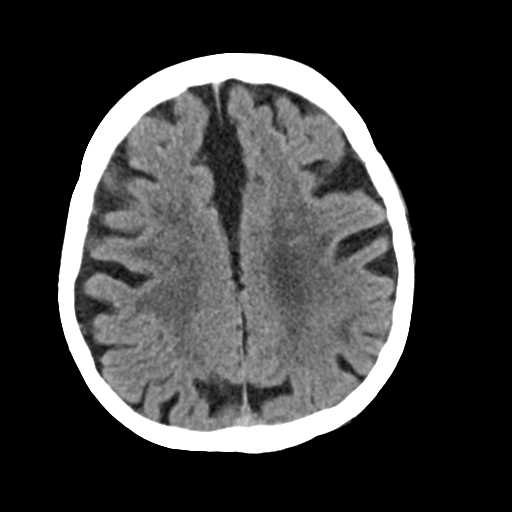
[im 22/30  brain]
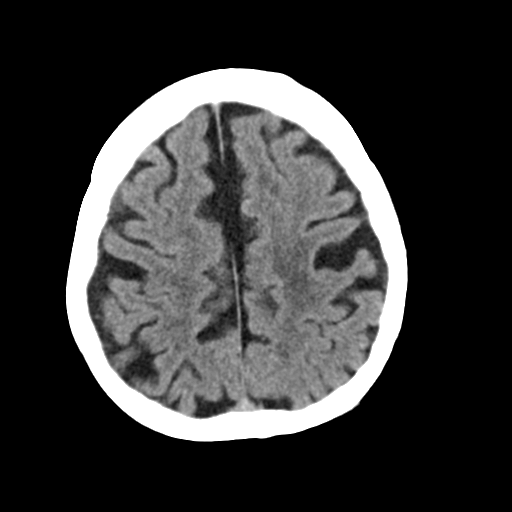
[im 23/30  brain]
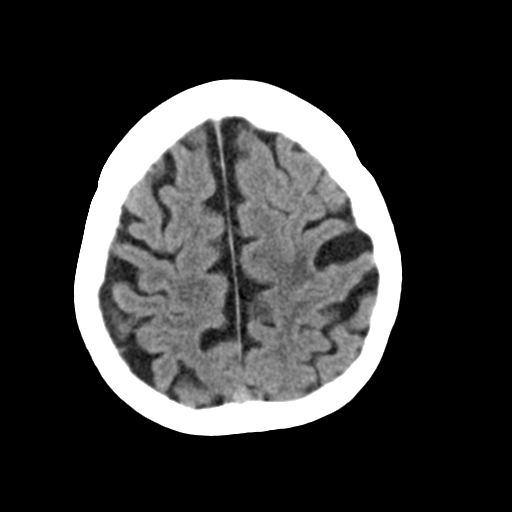
[im 23/30  bone]
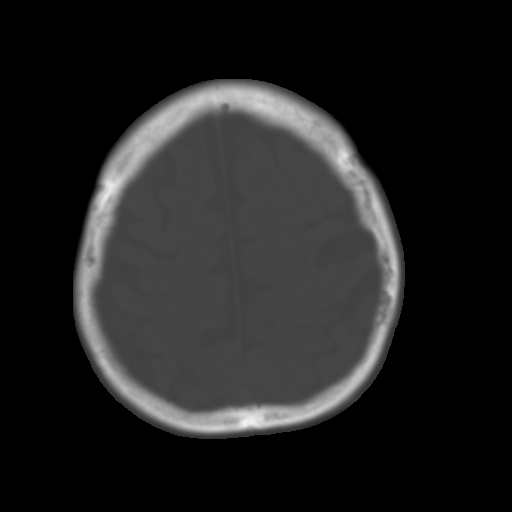
[im 25/30  brain]
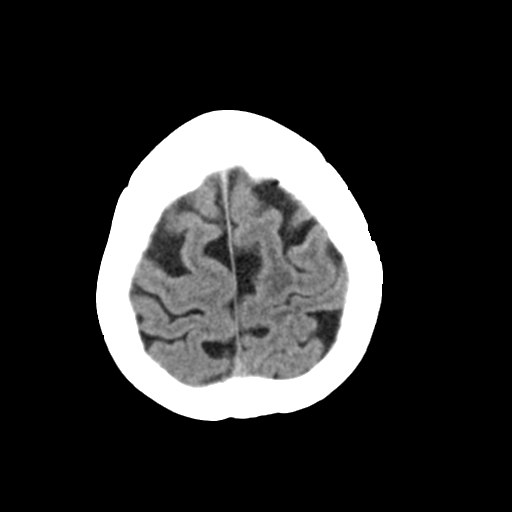
[im 27/30  brain]
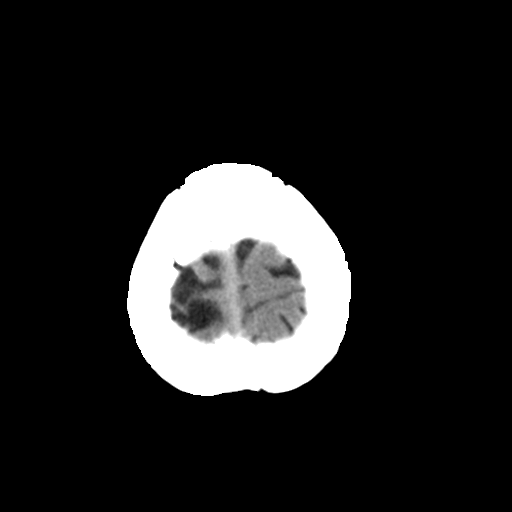
[im 29/30  brain]
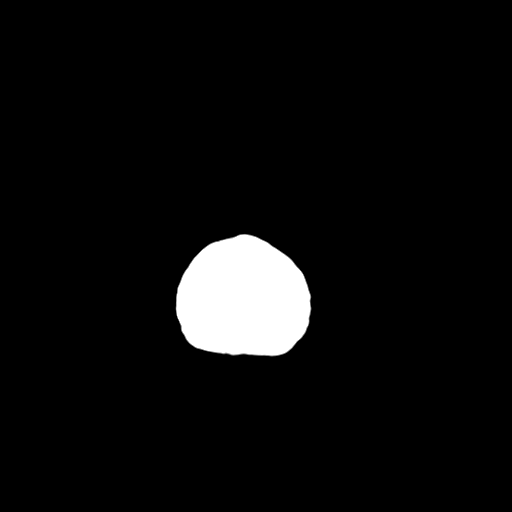

[16 of 30 positions shown; findings below may reference images not displayed]

FINDINGS: Brain: Atrophy with chronic microvascular ischemic change is stable.
No acute stroke or hemorrhage. Previous RIGHT parietal subarachnoid
blood is resolved. No interval hydrocephalus. BILATERAL lacunar
infarcts are stable.

Vascular: Vascular calcification evident advance nature representing
atherosclerosis. No signs of emergent large vessel occlusion.

Skull: No calvarial fracture.  Nasal bone fracture redemonstrated.

Sinuses/Orbits: Trace fluid in the dependent LEFT maxillary sinus,
stable.

Other: Resolving/ resolved scalp hematoma.
IMPRESSION: Resolved subarachnoid hemorrhage, posttraumatic in nature. No
associated skull fracture or acute intracranial process.

## 2017-12-11 ENCOUNTER — Other Ambulatory Visit: Payer: Self-pay | Admitting: Internal Medicine

## 2017-12-12 ENCOUNTER — Ambulatory Visit (INDEPENDENT_AMBULATORY_CARE_PROVIDER_SITE_OTHER): Payer: Medicare HMO | Admitting: Endocrinology

## 2017-12-12 ENCOUNTER — Encounter: Payer: Self-pay | Admitting: Endocrinology

## 2017-12-12 VITALS — BP 134/84 | HR 85 | Temp 98.0°F | Ht 67.0 in | Wt 167.0 lb

## 2017-12-12 DIAGNOSIS — E1159 Type 2 diabetes mellitus with other circulatory complications: Secondary | ICD-10-CM | POA: Diagnosis not present

## 2017-12-12 DIAGNOSIS — Z794 Long term (current) use of insulin: Secondary | ICD-10-CM | POA: Diagnosis not present

## 2017-12-12 DIAGNOSIS — E118 Type 2 diabetes mellitus with unspecified complications: Secondary | ICD-10-CM

## 2017-12-12 LAB — POCT GLYCOSYLATED HEMOGLOBIN (HGB A1C): Hemoglobin A1C: 8.1 % — AB (ref 4.0–5.6)

## 2017-12-12 MED ORDER — INSULIN GLARGINE 100 UNIT/ML SOLOSTAR PEN: 40.0000 [IU] | PEN_INJECTOR | SUBCUTANEOUS | 11 refills | Status: DC

## 2017-12-12 NOTE — Progress Notes (Signed)
Subjective:    Patient ID: [name], male    DOB: 07/10/1925, 84 y.o.   MRN: [id number]  HPI Pt returns for f/u of diabetes mellitus: DM type: Insulin-requiring type 2 Dx'ed: 9476 Complications: CAD Therapy: insulin since 2014, and januvia DKA: never Severe hypoglycemia: never Pancreatitis: never Pancreatic imaging: normal on 2014 [place] Other: He gives his own insulin, but does not check cbg's; due to frail elderly state, he is not a candidate for multiple daily injections. Interval history: no cbg record, but states cbg's vary from 77-200's.  It is in general higher as the day goes on. He takes lantus 40 units qam, and januvia.  However, he took 50 units qd, until a few days ago.  He wants to use up lantus before changing insulin. Past Medical History:  Diagnosis Date  . AAA (abdominal aortic aneurysm) ([place])   . Anemia due to chronic blood loss 11/25/2008   Recurrent over the years EGD and colonoscopy x 2 each 2004 and 2010 without cause    . Atrial fibrillation ([place])   . BPH (benign prostatic hyperplasia)    reports aprocedure (TURP) remotely in [place].Marland KitchenNo futher f/u w/ urology  . CAD (coronary artery disease)   . Diabetes mellitus, type 2 ([place])   . Diverticulosis    left colon  . Erosive gastritis   . Fall 08/08/2016   OUTSIDE AT HOME FACIAL TRAUMA   . GERD (gastroesophageal reflux disease)   . Hyperlipidemia   . Hypertension   . Hypothyroidism   . Insomnia    transient  . Osteopenia    dexa 2-11  . Vitamin B12 deficiency     Past Surgical History:  Procedure Laterality Date  . CARDIAC CATHETERIZATION  01/31/06, 09/25/10, 09-2011  . CATARACT EXTRACTION     right  . CHOLECYSTECTOMY, LAPAROSCOPIC    . COLONOSCOPY     multiple  . CORONARY ARTERY BYPASS GRAFT     1999 stents in 2000  . ESOPHAGOGASTRODUODENOSCOPY     multiple  . GIVENS CAPSULE STUDY N/A 11/04/2012   Procedure: GIVENS CAPSULE STUDY;  Surgeon: Gatha Mayer, MD;  Location: [facility] ENDOSCOPY;  Service:  Endoscopy;  Laterality: N/A;  . inguinal herniorrhaphies     bilateral  . LEFT HEART CATHETERIZATION WITH CORONARY ANGIOGRAM N/A 10/04/2011   Procedure: LEFT HEART CATHETERIZATION WITH CORONARY ANGIOGRAM;  Surgeon: Burnell Blanks, MD;  Location: [facility] CATH LAB;  Service: Cardiovascular;  Laterality: N/A;  . pacemaker  [place], 2013   medtronic minix [id number]  . PENILE PROSTHESIS IMPLANT  1992  . PERMANENT PACEMAKER GENERATOR CHANGE N/A 06/05/2011   Procedure: PERMANENT PACEMAKER GENERATOR CHANGE;  Surgeon: Evans Lance, MD;  Location: [facility] CATH LAB;  Service: Cardiovascular;  Laterality: N/A;  . PROSTATECTOMY     transurethral  . right hip replacement    . TRANSTHORACIC ECHOCARDIOGRAM  12/2006    Social History   Socioeconomic History  . Marital status: Legally Separated    Spouse name: Not on file  . Number of children: 5  . Years of education: Not on file  . Highest education level: Not on file  Occupational History  . Occupation: retired    Fish farm manager: RETIRED  Social Needs  . Financial resource strain: Not on file  . Food insecurity:    Worry: Not on file    Inability: Not on file  . Transportation needs:    Medical: Not on file    Non-medical: Not on file  Tobacco Use  .  Smoking status: Never Smoker  . Smokeless tobacco: Never Used  Substance and Sexual Activity  . Alcohol use: Yes    Alcohol/week: 0.6 - 1.2 oz    Types: 1 - 2 Glasses of wine per week    Comment: socially   . Drug use: No  . Sexual activity: Not Currently  Lifestyle  . Physical activity:    Days per week: Not on file    Minutes per session: Not on file  . Stress: Not on file  Relationships  . Social connections:    Talks on phone: Not on file    Gets together: Not on file    Attends religious service: Not on file    Active member of club or organization: Not on file    Attends meetings of clubs or organizations: Not on file    Relationship status: Not on file  . Intimate partner violence:     Fear of current or ex partner: Not on file    Emotionally abused: Not on file    Physically abused: Not on file    Forced sexual activity: Not on file  Other Topics Concern  . Not on file  Social History Narrative   Lives by self, independent on ADL, retired. Separated from wife.    Daughters:   [name] ( lives in [place]) [phone]   [name] ( lives out of town)  [phone]          Current Outpatient Medications on File Prior to Visit  Medication Sig Dispense Refill  . ACCU-CHEK FASTCLIX LANCETS MISC Used to check blood sugars 2x daily. 102 each 11  . acetaminophen (TYLENOL) 325 MG tablet Take 650 mg by mouth every 6 (six) hours as needed for mild pain or moderate pain.     [name] apixaban (ELIQUIS) 5 MG TABS tablet Take 1 tablet (5 mg total) by mouth 2 (two) times daily. 180 tablet 1  . azelastine (ASTELIN) 0.1 % nasal spray Place 2 sprays into both nostrils at bedtime as needed for rhinitis or allergies. Use in each nostril as directed 30 mL 12  . Blood Glucose Monitoring Suppl (ACCU-CHEK GUIDE) w/Device KIT 1 Device by Does not apply route daily. 1 kit 0  . digoxin (LANOXIN) 0.125 MG tablet Take 1 tablet (0.125 mg total) by mouth every other day. 45 tablet 2  . escitalopram (LEXAPRO) 10 MG tablet Take 1 tablet (10 mg total) by mouth daily. 90 tablet 2  . fenofibrate micronized (LOFIBRA) 134 MG capsule Take 1 capsule (134 mg total) by mouth daily. 90 capsule 1  . glucose blood (ACCU-CHEK GUIDE) test strip Used to check blood sugars 2x daily. 100 each 12  . Insulin Pen Needle 31G X 6 MM MISC To use w/ Lantus 100 each 12  . latanoprost (XALATAN) 0.005 % ophthalmic solution Place 1 drop into both eyes at bedtime.    [name] levothyroxine (SYNTHROID, LEVOTHROID) 88 MCG tablet Take 1 tablet (88 mcg total) by mouth daily before breakfast. 90 tablet 2  . metoprolol tartrate (LOPRESSOR) 25 MG tablet Take 1 tablet (25 mg total) by mouth 2 (two) times daily. 180 tablet 1  . mirtazapine (REMERON)  15 MG tablet Take 1 tablet (15 mg total) by mouth at bedtime. 90 tablet 1  . nitroGLYCERIN (NITROSTAT) 0.4 MG SL tablet Place 1 tablet (0.4 mg total) under the tongue every 5 (five) minutes x 3 doses as needed for chest pain. Then contact 911 or go to ER 25 tablet  3  . omeprazole (PRILOSEC) 40 MG capsule Take 1 capsule (40 mg total) by mouth daily. 90 capsule 3  . pantoprazole (PROTONIX) 40 MG tablet Take 1 tablet (40 mg total) by mouth daily before breakfast. 90 tablet 3  . pravastatin (PRAVACHOL) 40 MG tablet Take 2 tablets (80 mg total) by mouth daily. 180 tablet 1  . sitaGLIPtin (JANUVIA) 100 MG tablet Take 1 tablet (100 mg total) by mouth daily. 90 tablet 1  . triamcinolone ointment (KENALOG) 0.1 %     . vitamin B-12 (CYANOCOBALAMIN) 1000 MCG tablet Take 1 tablet (1,000 mcg total) by mouth daily. 90 tablet 2   No current facility-administered medications on file prior to visit.     Allergies  Allergen Reactions  . Ace Inhibitors Cough  . Codeine Phosphate Nausea Only  . Hydrochlorothiazide W-Triamterene Other (See Comments)    CRAMPING    Family History  Problem Relation Age of Onset  . Alzheimer's disease Mother   . Heart failure Father   . CVA Father   . Coronary artery disease Brother        cabg  . Alzheimer's disease Sister   . Mental illness Sister   . Alzheimer's disease Sister   . Other Sister        lung problems  . Diabetes Other        several siblings  . Prostate cancer Neg Hx   . Colon cancer Neg Hx   . Esophageal cancer Neg Hx   . Stomach cancer Neg Hx   . Rectal cancer Neg Hx     BP 134/84 (BP Location: Right Arm, Patient Position: Sitting, Cuff Size: Normal)   Pulse 85   Temp 98 F (36.7 C) (Oral)   Ht 5' 7" (1.702 m)   Wt 167 lb (75.8 kg)   SpO2 97%   BMI 26.16 kg/m   Review of Systems He denies hypoglycemia.      Objective:   Physical Exam VITAL SIGNS:  See vs page GENERAL: no distress Pulses: foot pulses are intact bilaterally.   MSK:  no deformity of the feet or ankles.  CV: no edema of the legs or ankles Skin:  no ulcer on the feet or ankles.  normal color and temp on the feet and ankles.  Old healed surgical scars on the right leg (vein harvest).   Ext: There is bilateral onychomycosis of the toenails.  Neuro: sensation is intact to touch on the feet and ankles.    Lab Results  Component Value Date   CREATININE 0.94 07/07/2017   BUN 14 07/07/2017   NA 132 (L) 07/07/2017   K 4.4 07/07/2017   CL 101 07/07/2017   CO2 24 07/07/2017   Lab Results  Component Value Date   HGBA1C 8.1 (A) 12/12/2017       Assessment & Plan:  Insulin-requiring type 2 DM, with CAD: he prob needs NPH or levemir.  The pattern of his cbg's indicates he needs a faster-acting qd insulin, but he wants to use up lantus first  Patient Instructions  check your blood sugar twice a day.  vary the time of day when you check, between before the 3 meals, and at bedtime.  also check if you have symptoms of your blood sugar being too high or too low.  please keep a record of the readings and bring it to your next appointment here (or you can bring the meter itself).  You can write it on any piece of  paper.  please call us sooner if your blood sugar goes below 70, or if you have a lot of readings over 200. Please continue lantus 40 units each morning.   Please come back for a follow-up appointment in 2 months.

## 2017-12-12 NOTE — Patient Instructions (Addendum)
check your blood sugar twice a day.  vary the time of day when you check, between before the 3 meals, and at bedtime.  also check if you have symptoms of your blood sugar being too high or too low.  please keep a record of the readings and bring it to your next appointment here (or you can bring the meter itself).  You can write it on any piece of paper.  please call us sooner if your blood sugar goes below 70, or if you have a lot of readings over 200. Please continue lantus 40 units each morning.   Please come back for a follow-up appointment in 2 months.

## 2017-12-15 ENCOUNTER — Other Ambulatory Visit (INDEPENDENT_AMBULATORY_CARE_PROVIDER_SITE_OTHER): Payer: Medicare HMO

## 2017-12-15 DIAGNOSIS — E785 Hyperlipidemia, unspecified: Secondary | ICD-10-CM

## 2017-12-15 DIAGNOSIS — I4821 Permanent atrial fibrillation: Secondary | ICD-10-CM

## 2017-12-15 DIAGNOSIS — E039 Hypothyroidism, unspecified: Secondary | ICD-10-CM

## 2017-12-15 DIAGNOSIS — N189 Chronic kidney disease, unspecified: Secondary | ICD-10-CM

## 2017-12-15 DIAGNOSIS — I482 Chronic atrial fibrillation: Secondary | ICD-10-CM | POA: Diagnosis not present

## 2017-12-15 LAB — BASIC METABOLIC PANEL
BUN: 14 mg/dL (ref 6–23)
CALCIUM: 9 mg/dL (ref 8.4–10.5)
CO2: 25 meq/L (ref 19–32)
Chloride: 107 mEq/L (ref 96–112)
Creatinine, Ser: 1.22 mg/dL (ref 0.40–1.50)
GFR: 59.34 mL/min — ABNORMAL LOW (ref >60.00)
GLUCOSE: 114 mg/dL — AB (ref 70–99)
POTASSIUM: 4.3 meq/L (ref 3.5–5.1)
Sodium: 139 mEq/L (ref 135–145)

## 2017-12-15 LAB — LIPID PANEL
CHOL/HDL RATIO: 5
Cholesterol: 123 mg/dL (ref 0–200)
HDL: 26.1 mg/dL — AB (ref >39.00)
LDL Cholesterol: 71 mg/dL (ref 0–99)
NonHDL: 96.87
Triglycerides: 129 mg/dL (ref 0.0–149.0)
VLDL: 25.8 mg/dL (ref 0.0–40.0)

## 2017-12-15 LAB — TSH: TSH: 4.07 u[IU]/mL (ref 0.35–4.50)

## 2017-12-15 LAB — ALT: ALT: 9 U/L (ref 0–53)

## 2017-12-15 LAB — AST: AST: 12 U/L (ref 0–37)

## 2017-12-16 LAB — DIGOXIN LEVEL: Digoxin Level: <0.5 mcg/L — ABNORMAL LOW (ref 0.8–2.0)

## 2018-01-19 ENCOUNTER — Ambulatory Visit (INDEPENDENT_AMBULATORY_CARE_PROVIDER_SITE_OTHER): Payer: Medicare HMO | Admitting: *Deleted

## 2018-01-19 DIAGNOSIS — I482 Chronic atrial fibrillation: Secondary | ICD-10-CM | POA: Diagnosis not present

## 2018-01-19 DIAGNOSIS — Z950 Presence of cardiac pacemaker: Secondary | ICD-10-CM | POA: Diagnosis not present

## 2018-01-19 DIAGNOSIS — I4821 Permanent atrial fibrillation: Secondary | ICD-10-CM

## 2018-01-19 LAB — CUP PACEART INCLINIC DEVICE CHECK
Date Time Interrogation Session: 20190909154925
Implantable Lead Implant Date: 19950413
Implantable Lead Location: 753860
Implantable Pulse Generator Implant Date: 20130123
Lead Channel Pacing Threshold Amplitude: 0.75 V
Lead Channel Pacing Threshold Amplitude: 0.875 V
Lead Channel Pacing Threshold Pulse Width: 0.4 ms
Lead Channel Pacing Threshold Pulse Width: 0.4 ms
Lead Channel Sensing Intrinsic Amplitude: 11.2 mV
Lead Channel Setting Pacing Amplitude: 2.5 V
MDC IDC MSMT BATTERY IMPEDANCE: 3372 Ohm
MDC IDC MSMT BATTERY REMAINING LONGEVITY: 19 mo
MDC IDC MSMT BATTERY VOLTAGE: 2.71 V
MDC IDC MSMT LEADCHNL RA IMPEDANCE VALUE: 0 Ohm
MDC IDC MSMT LEADCHNL RV IMPEDANCE VALUE: 510 Ohm
MDC IDC SET LEADCHNL RV PACING PULSEWIDTH: 0.4 ms
MDC IDC SET LEADCHNL RV SENSING SENSITIVITY: 4 mV
MDC IDC STAT BRADY RV PERCENT PACED: 88 %

## 2018-01-19 NOTE — Progress Notes (Signed)
Pacemaker check in clinic. Normal device function. Threshold, sensing, impedance consistent with previous measurements. Device programmed to maximize longevity. No high ventricular rates noted. Device programmed at appropriate safety margins. Histogram distribution appropriate for patient activity level. Device programmed to optimize intrinsic conduction. Estimated longevity <5-32 months with an average longevity of 19 months. ROV with DC in 3 months (per pt preference d/t battery) and with GT in March 2020.

## 2018-02-09 ENCOUNTER — Telehealth: Payer: Self-pay | Admitting: Internal Medicine

## 2018-02-09 ENCOUNTER — Telehealth: Payer: Self-pay | Admitting: Endocrinology

## 2018-02-09 NOTE — Telephone Encounter (Signed)
OK, i'll address then

## 2018-02-09 NOTE — Telephone Encounter (Signed)
Pt daughter has stated her dads Medicaid lapsed and needs a paper from Dr. Loanne Drilling stating the pt still needs to treated with him so he can afford his medication with Medicaid. Will be here on Wed. Wanted to see if they could get it then. Please advise. Ph # [phone]

## 2018-02-09 NOTE — Telephone Encounter (Signed)
Copied from [place] [phone]. Topic: General - Other >> Feb 09, 2018  2:26 PM Lennox Solders wrote: Reason for CRM: pt daughter [name] is calling  dr Larose Kells wrote a letter for her dad on nov 19/2018 . The letter was written so that he can still get his medicaid due to patient takes a lot of medication, pt is dm and pt is on eliquis. Pt can not afford eliquis without medicaid financial support.

## 2018-02-09 NOTE — Telephone Encounter (Signed)
Please advise 

## 2018-02-09 NOTE — Telephone Encounter (Signed)
Pt daughter calling back in she is asking for letter to be wrote for father to be kept on medicaid. Please call her.

## 2018-02-09 NOTE — Telephone Encounter (Signed)
Spoke w/ [name], she is needing another letter. Letter printed and placed at front desk for pick up.

## 2018-02-09 NOTE — Telephone Encounter (Signed)
Please contact the patient daughter, seems that he needs a similar letter, I will be happy to write it again if that is the case

## 2018-02-10 DIAGNOSIS — H52203 Unspecified astigmatism, bilateral: Secondary | ICD-10-CM | POA: Diagnosis not present

## 2018-02-10 DIAGNOSIS — H02403 Unspecified ptosis of bilateral eyelids: Secondary | ICD-10-CM | POA: Diagnosis not present

## 2018-02-10 DIAGNOSIS — H401133 Primary open-angle glaucoma, bilateral, severe stage: Secondary | ICD-10-CM | POA: Diagnosis not present

## 2018-02-10 DIAGNOSIS — E119 Type 2 diabetes mellitus without complications: Secondary | ICD-10-CM | POA: Diagnosis not present

## 2018-02-10 LAB — HM DIABETES EYE EXAM

## 2018-02-11 ENCOUNTER — Telehealth: Payer: Self-pay

## 2018-02-11 ENCOUNTER — Encounter: Payer: Self-pay | Admitting: Endocrinology

## 2018-02-11 ENCOUNTER — Ambulatory Visit (INDEPENDENT_AMBULATORY_CARE_PROVIDER_SITE_OTHER): Payer: Medicare HMO | Admitting: Endocrinology

## 2018-02-11 ENCOUNTER — Telehealth: Payer: Self-pay | Admitting: *Deleted

## 2018-02-11 VITALS — BP 130/82 | HR 83 | Ht 67.0 in | Wt 168.2 lb

## 2018-02-11 DIAGNOSIS — E118 Type 2 diabetes mellitus with unspecified complications: Secondary | ICD-10-CM

## 2018-02-11 LAB — POCT GLYCOSYLATED HEMOGLOBIN (HGB A1C): HEMOGLOBIN A1C: 7.6 % — AB (ref 4.0–5.6)

## 2018-02-11 MED ORDER — INSULIN GLARGINE 100 UNIT/ML SOLOSTAR PEN: 38.0000 [IU] | PEN_INJECTOR | SUBCUTANEOUS | 11 refills | Status: DC

## 2018-02-11 NOTE — Progress Notes (Signed)
Subjective:    Patient ID: [name], male    DOB: 1926/03/10, 84 y.o.   MRN: [id number]  HPI Pt returns for f/u of diabetes mellitus: DM type: Insulin-requiring type 2 Dx'ed: 4562 Complications: CAD and renal insuff Therapy: insulin since 2014, and januvia DKA: never Severe hypoglycemia: never Pancreatitis: never Pancreatic imaging: normal on 2014 [place] Other: He gives his own insulin, but does not check cbg's; due to frail elderly state, he is not a candidate for multiple daily injections. Interval history: no cbg record, but states cbg's vary from 65-200's.  It is in general higher as the day goes on. He takes lantus 40 units qam, and januvia.  However, he took 50 units qd, until a few days ago.  He wants to use up lantus before changing insulin.  He says he has 6 vials left.  Past Medical History:  Diagnosis Date  . AAA (abdominal aortic aneurysm) ([place])   . Anemia due to chronic blood loss 11/25/2008   Recurrent over the years EGD and colonoscopy x 2 each 2004 and 2010 without cause    . Atrial fibrillation ([place])   . BPH (benign prostatic hyperplasia)    reports aprocedure (TURP) remotely in [place].Marland KitchenNo futher f/u w/ urology  . CAD (coronary artery disease)   . Diabetes mellitus, type 2 ([place])   . Diverticulosis    left colon  . Erosive gastritis   . Fall 08/08/2016   OUTSIDE AT HOME FACIAL TRAUMA   . GERD (gastroesophageal reflux disease)   . Hyperlipidemia   . Hypertension   . Hypothyroidism   . Insomnia    transient  . Osteopenia    dexa 2-11  . Vitamin B12 deficiency     Past Surgical History:  Procedure Laterality Date  . CARDIAC CATHETERIZATION  01/31/06, 09/25/10, 09-2011  . CATARACT EXTRACTION     right  . CHOLECYSTECTOMY, LAPAROSCOPIC    . COLONOSCOPY     multiple  . CORONARY ARTERY BYPASS GRAFT     1999 stents in 2000  . ESOPHAGOGASTRODUODENOSCOPY     multiple  . GIVENS CAPSULE STUDY N/A 11/04/2012   Procedure: GIVENS CAPSULE STUDY;  Surgeon: Gatha Mayer, MD;  Location: [facility] ENDOSCOPY;  Service: Endoscopy;  Laterality: N/A;  . inguinal herniorrhaphies     bilateral  . LEFT HEART CATHETERIZATION WITH CORONARY ANGIOGRAM N/A 10/04/2011   Procedure: LEFT HEART CATHETERIZATION WITH CORONARY ANGIOGRAM;  Surgeon: Burnell Blanks, MD;  Location: [facility] CATH LAB;  Service: Cardiovascular;  Laterality: N/A;  . pacemaker  [place], 2013   medtronic minix [id number]  . PENILE PROSTHESIS IMPLANT  1992  . PERMANENT PACEMAKER GENERATOR CHANGE N/A 06/05/2011   Procedure: PERMANENT PACEMAKER GENERATOR CHANGE;  Surgeon: Evans Lance, MD;  Location: [facility] CATH LAB;  Service: Cardiovascular;  Laterality: N/A;  . PROSTATECTOMY     transurethral  . right hip replacement    . TRANSTHORACIC ECHOCARDIOGRAM  12/2006    Social History   Socioeconomic History  . Marital status: Legally Separated    Spouse name: Not on file  . Number of children: 5  . Years of education: Not on file  . Highest education level: Not on file  Occupational History  . Occupation: retired    Fish farm manager: RETIRED  Social Needs  . Financial resource strain: Not on file  . Food insecurity:    Worry: Not on file    Inability: Not on file  . Transportation needs:    Medical: Not  on file    Non-medical: Not on file  Tobacco Use  . Smoking status: Never Smoker  . Smokeless tobacco: Never Used  Substance and Sexual Activity  . Alcohol use: Yes    Alcohol/week: 1.0 - 2.0 standard drinks    Types: 1 - 2 Glasses of wine per week    Comment: socially   . Drug use: No  . Sexual activity: Not Currently  Lifestyle  . Physical activity:    Days per week: Not on file    Minutes per session: Not on file  . Stress: Not on file  Relationships  . Social connections:    Talks on phone: Not on file    Gets together: Not on file    Attends religious service: Not on file    Active member of club or organization: Not on file    Attends meetings of clubs or organizations: Not on file     Relationship status: Not on file  . Intimate partner violence:    Fear of current or ex partner: Not on file    Emotionally abused: Not on file    Physically abused: Not on file    Forced sexual activity: Not on file  Other Topics Concern  . Not on file  Social History Narrative   Lives by self, independent on ADL, retired. Separated from wife.    Daughters:   [name] ( lives in [place]) [phone]   [name] ( lives out of town)  [phone]          Current Outpatient Medications on File Prior to Visit  Medication Sig Dispense Refill  . ACCU-CHEK FASTCLIX LANCETS MISC Used to check blood sugars 2x daily. 102 each 11  . acetaminophen (TYLENOL) 325 MG tablet Take 650 mg by mouth every 6 (six) hours as needed for mild pain or moderate pain.     [name] apixaban (ELIQUIS) 5 MG TABS tablet Take 1 tablet (5 mg total) by mouth 2 (two) times daily. 180 tablet 1  . azelastine (ASTELIN) 0.1 % nasal spray Place 2 sprays into both nostrils at bedtime as needed for rhinitis or allergies. Use in each nostril as directed 30 mL 12  . Blood Glucose Monitoring Suppl (ACCU-CHEK GUIDE) w/Device KIT 1 Device by Does not apply route daily. 1 kit 0  . digoxin (LANOXIN) 0.125 MG tablet Take 1 tablet (0.125 mg total) by mouth every other day. 45 tablet 2  . escitalopram (LEXAPRO) 10 MG tablet Take 1 tablet (10 mg total) by mouth daily. 90 tablet 2  . fenofibrate micronized (LOFIBRA) 134 MG capsule Take 1 capsule (134 mg total) by mouth daily. 90 capsule 1  . glucose blood (ACCU-CHEK GUIDE) test strip Used to check blood sugars 2x daily. 100 each 12  . Insulin Pen Needle 31G X 6 MM MISC To use w/ Lantus 100 each 12  . latanoprost (XALATAN) 0.005 % ophthalmic solution Place 1 drop into both eyes at bedtime.    [name] levothyroxine (SYNTHROID, LEVOTHROID) 88 MCG tablet Take 1 tablet (88 mcg total) by mouth daily before breakfast. 90 tablet 2  . metoprolol tartrate (LOPRESSOR) 25 MG tablet Take 1 tablet (25 mg total) by  mouth 2 (two) times daily. 180 tablet 1  . mirtazapine (REMERON) 15 MG tablet Take 1 tablet (15 mg total) by mouth at bedtime. 90 tablet 1  . nitroGLYCERIN (NITROSTAT) 0.4 MG SL tablet Place 1 tablet (0.4 mg total) under the tongue every 5 (five) minutes x 3  doses as needed for chest pain. Then contact 911 or go to ER 25 tablet 3  . omeprazole (PRILOSEC) 40 MG capsule Take 1 capsule (40 mg total) by mouth daily. 90 capsule 3  . pantoprazole (PROTONIX) 40 MG tablet Take 1 tablet (40 mg total) by mouth daily before breakfast. 90 tablet 3  . pravastatin (PRAVACHOL) 40 MG tablet Take 2 tablets (80 mg total) by mouth daily. 180 tablet 1  . sitaGLIPtin (JANUVIA) 100 MG tablet Take 1 tablet (100 mg total) by mouth daily. 90 tablet 1  . triamcinolone ointment (KENALOG) 0.1 %     . vitamin B-12 (CYANOCOBALAMIN) 1000 MCG tablet Take 1 tablet (1,000 mcg total) by mouth daily. 90 tablet 2   No current facility-administered medications on file prior to visit.     Allergies  Allergen Reactions  . Ace Inhibitors Cough  . Codeine Phosphate Nausea Only  . Hydrochlorothiazide W-Triamterene Other (See Comments)    CRAMPING    Family History  Problem Relation Age of Onset  . Alzheimer's disease Mother   . Heart failure Father   . CVA Father   . Coronary artery disease Brother        cabg  . Alzheimer's disease Sister   . Mental illness Sister   . Alzheimer's disease Sister   . Other Sister        lung problems  . Diabetes Other        several siblings  . Prostate cancer Neg Hx   . Colon cancer Neg Hx   . Esophageal cancer Neg Hx   . Stomach cancer Neg Hx   . Rectal cancer Neg Hx     BP 130/82 (BP Location: Right Arm)   Pulse 83   Ht '5\' 7"'  (1.702 m)   Wt 168 lb 3.2 oz (76.3 kg)   SpO2 97%   BMI 26.34 kg/m    Review of Systems He denies hypoglycemia    Objective:   Physical Exam VITAL SIGNS:  See vs page GENERAL: no distress Pulses: foot pulses are intact bilaterally.   MSK: no  deformity of the feet or ankles.  CV: no edema of the legs or ankles Skin:  no ulcer on the feet or ankles.  normal color and temp on the feet and ankles.  Old healed surgical scar (vein harvest) at the right leg.   Neuro: sensation is intact to touch on the feet and ankles.      Lab Results  Component Value Date   HGBA1C 7.6 (A) 02/11/2018   Lab Results  Component Value Date   CREATININE 1.22 12/15/2017   BUN 14 12/15/2017   NA 139 12/15/2017   K 4.3 12/15/2017   CL 107 12/15/2017   CO2 25 12/15/2017       Assessment & Plan:  Insulin-requiring type 2 DM, with renal insuff: he needs a faster-acting qd insulin, but he wants to use up lantus first Hypoglycemia: we need to reduce insulin Frail elderly state: in this setting, he is not a candidate for aggressive glycemic control  Patient Instructions  check your blood sugar twice a day.  vary the time of day when you check, between before the 3 meals, and at bedtime.  also check if you have symptoms of your blood sugar being too high or too low.  please keep a record of the readings and bring it to your next appointment here (or you can bring the meter itself).  You can write it on  any piece of paper.  please call us sooner if your blood sugar goes below 70, or if you have a lot of readings over 200. Please reduce the lantus to 38 units each morning.   Please come back for a follow-up appointment in 3-4 months.

## 2018-02-11 NOTE — Telephone Encounter (Signed)
Please see phone note from 02/09/18

## 2018-02-11 NOTE — Telephone Encounter (Signed)
   [facility] Pre-operative Risk Assessment    Request for surgical clearance:  1. What type of surgery is being performed?  EXTRACTION OF 8 TEETH WITH LOCAL ANESTHETIC    2. When is this surgery scheduled? TBD   3. What type of clearance is required (medical clearance vs. Pharmacy clearance to hold med vs. Both)?  BOTH  4. Are there any medications that need to be held prior to surgery and how long? NOT SPECIFIED--ALSO WANTS TO KNOW IF PT WILL NEED ANTIBIOTIC PROPHYLAXIS    5. Practice name and name of physician performing surgery?  [facility]   6. What is your office phone number [phone]    7.   What is your office fax number [phone]  8.   Anesthesia type (None, local, MAC, general) ? LOCAL   Winifred Olive M 02/11/2018, 2:20 PM  _________________________________________________________________   (provider comments below)

## 2018-02-11 NOTE — Telephone Encounter (Signed)
Please advise 

## 2018-02-11 NOTE — Telephone Encounter (Signed)
I need to know what he wants the letter to say

## 2018-02-11 NOTE — Patient Instructions (Addendum)
check your blood sugar twice a day.  vary the time of day when you check, between before the 3 meals, and at bedtime.  also check if you have symptoms of your blood sugar being too high or too low.  please keep a record of the readings and bring it to your next appointment here (or you can bring the meter itself).  You can write it on any piece of paper.  please call us sooner if your blood sugar goes below 70, or if you have a lot of readings over 200. Please reduce the lantus to 38 units each morning.   Please come back for a follow-up appointment in 3-4 months.

## 2018-02-11 NOTE — Telephone Encounter (Signed)
Patient was seen today and at the checkout he stated he needs a letter of medical necessity to be prepared by the MD so that he can send it into medicare- patient stated medicare needs this ASAP and would like a call once this has been mailed- please mail to [address], [place] [id number]

## 2018-02-12 NOTE — Telephone Encounter (Signed)
I printed letter.

## 2018-02-12 NOTE — Telephone Encounter (Signed)
Daughter informed and will pick up

## 2018-02-13 ENCOUNTER — Telehealth: Payer: Self-pay | Admitting: *Deleted

## 2018-02-13 NOTE — Telephone Encounter (Signed)
I was unable to get in contact with the patient- "mailbox full". I was able to contact the pt's daughter. She tells me he has been doing well, no complaints of chest pain. His LOV was march 2019 and he was doing well then. He is having multiple tooth extractions and will need to hold Eliquis, will route to pharmacy for their recomendations. I can see no reason why he would need SBE prophylaxis.   Kerin Ransom PA-C 02/13/2018 12:15 PM

## 2018-02-13 NOTE — Telephone Encounter (Signed)
Received Medical/Surgical Clearance Form from [facility] for extraction of [8] teeth w/local anesthetic; forwarded to provider wirh requested last OV notes & medication list/SLS

## 2018-02-13 NOTE — Telephone Encounter (Signed)
-  He needs a dental procedure, he is clear from my side, they need  clearence  from cardiology as he uses Eliquis. - Amoxicillin predental procedure is in his med list, I do not see a reason why he would need that;  I called the patient's daughter [name],  she does not recall that being recommended  -We will fax the form back

## 2018-02-16 ENCOUNTER — Encounter: Payer: Self-pay | Admitting: Internal Medicine

## 2018-02-16 NOTE — Telephone Encounter (Signed)
   Primary Cardiologist: Cristopher Peru, MD  Chart reviewed as part of pre-operative protocol coverage. Given past medical history and time since last visit, based on ACC/AHA guidelines, [name] would be at acceptable risk for the planned procedure without further cardiovascular testing.  He has no chest pain or SOB.  He does have a pacemaker.  No need for pre-antibiotics.  See attached for eliquis instructions.   I will route this recommendation to the requesting party via Epic fax function and remove from pre-op pool.  Please call with questions.  Cecilie Kicks, NP 02/16/2018, 3:12 PM

## 2018-02-16 NOTE — Telephone Encounter (Signed)
Pt takes Eliquis for afib with CHADS2VASc score of 5 (age x2, HTN, CAD, DM). CrCl is 35mL/min. Ok to hold Eliquis for 2 days prior to multiple extractions. Agree pt does not require pre op antibiotics.

## 2018-02-16 NOTE — Telephone Encounter (Signed)
Form faxed to [facility] at [phone]. Form sent for scanning.

## 2018-02-25 ENCOUNTER — Ambulatory Visit (INDEPENDENT_AMBULATORY_CARE_PROVIDER_SITE_OTHER): Payer: Medicare HMO | Admitting: *Deleted

## 2018-02-25 DIAGNOSIS — I4821 Permanent atrial fibrillation: Secondary | ICD-10-CM

## 2018-02-25 DIAGNOSIS — Z950 Presence of cardiac pacemaker: Secondary | ICD-10-CM

## 2018-02-25 LAB — CUP PACEART INCLINIC DEVICE CHECK
Battery Remaining Longevity: 15 mo
Brady Statistic RV Percent Paced: 89 %
Date Time Interrogation Session: 20191016121527
Implantable Lead Implant Date: 19950413
Implantable Lead Location: 753860
Lead Channel Impedance Value: 495 Ohm
Lead Channel Pacing Threshold Pulse Width: 0.4 ms
Lead Channel Pacing Threshold Pulse Width: 0.4 ms
Lead Channel Sensing Intrinsic Amplitude: 11.2 mV
Lead Channel Setting Pacing Pulse Width: 0.4 ms
MDC IDC MSMT BATTERY IMPEDANCE: 3818 Ohm
MDC IDC MSMT BATTERY VOLTAGE: 2.68 V
MDC IDC MSMT LEADCHNL RA IMPEDANCE VALUE: 0 Ohm
MDC IDC MSMT LEADCHNL RV PACING THRESHOLD AMPLITUDE: 0.5 V
MDC IDC MSMT LEADCHNL RV PACING THRESHOLD AMPLITUDE: 0.75 V
MDC IDC PG IMPLANT DT: 20130123
MDC IDC SET LEADCHNL RV PACING AMPLITUDE: 2.5 V
MDC IDC SET LEADCHNL RV SENSING SENSITIVITY: 2.8 mV

## 2018-02-25 NOTE — Progress Notes (Signed)
Pacemaker check in clinic per patient request. He reports having anxiety related to an extensive dental procedure tomorrow and wants to be sure that his PPM is functioning normally. Normal device function. Threshold, sensing, impedance consistent with previous measurements. Device programmed to maximize longevity. No high ventricular rates noted. Device programmed at appropriate safety margins. Histogram distribution appropriate for patient activity level. Device programmed to optimize intrinsic conduction. Estimated longevity <2-28 months with an avg longevity of 15 months. ROV with Device Clinic 04/22/18.

## 2018-02-26 DIAGNOSIS — R69 Illness, unspecified: Secondary | ICD-10-CM | POA: Diagnosis not present

## 2018-04-06 DIAGNOSIS — L814 Other melanin hyperpigmentation: Secondary | ICD-10-CM | POA: Diagnosis not present

## 2018-04-06 DIAGNOSIS — L821 Other seborrheic keratosis: Secondary | ICD-10-CM | POA: Diagnosis not present

## 2018-04-06 DIAGNOSIS — D1801 Hemangioma of skin and subcutaneous tissue: Secondary | ICD-10-CM | POA: Diagnosis not present

## 2018-04-06 DIAGNOSIS — Z85828 Personal history of other malignant neoplasm of skin: Secondary | ICD-10-CM | POA: Diagnosis not present

## 2018-04-06 DIAGNOSIS — L308 Other specified dermatitis: Secondary | ICD-10-CM | POA: Diagnosis not present

## 2018-04-06 DIAGNOSIS — L570 Actinic keratosis: Secondary | ICD-10-CM | POA: Diagnosis not present

## 2018-04-06 DIAGNOSIS — D225 Melanocytic nevi of trunk: Secondary | ICD-10-CM | POA: Diagnosis not present

## 2018-04-07 DIAGNOSIS — H401133 Primary open-angle glaucoma, bilateral, severe stage: Secondary | ICD-10-CM | POA: Diagnosis not present

## 2018-04-13 ENCOUNTER — Encounter: Payer: Self-pay | Admitting: Internal Medicine

## 2018-04-13 ENCOUNTER — Ambulatory Visit (INDEPENDENT_AMBULATORY_CARE_PROVIDER_SITE_OTHER): Payer: Medicare HMO | Admitting: Internal Medicine

## 2018-04-13 VITALS — BP 124/64 | HR 68 | Temp 98.1°F | Resp 16 | Ht 67.0 in | Wt 167.2 lb

## 2018-04-13 DIAGNOSIS — E118 Type 2 diabetes mellitus with unspecified complications: Secondary | ICD-10-CM | POA: Diagnosis not present

## 2018-04-13 DIAGNOSIS — E039 Hypothyroidism, unspecified: Secondary | ICD-10-CM | POA: Diagnosis not present

## 2018-04-13 DIAGNOSIS — E785 Hyperlipidemia, unspecified: Secondary | ICD-10-CM

## 2018-04-13 DIAGNOSIS — I10 Essential (primary) hypertension: Secondary | ICD-10-CM | POA: Diagnosis not present

## 2018-04-13 MED ORDER — ZOSTER VAC RECOMB ADJUVANTED 50 MCG/0.5ML IM SUSR: 0.5000 mL | Freq: Once | INTRAMUSCULAR | 1 refills | Status: AC

## 2018-04-13 NOTE — Patient Instructions (Addendum)
GO TO THE LAB : Get the blood work     GO TO THE FRONT DESK Schedule your next appointment for a   checkup in 6 months 

## 2018-04-13 NOTE — Progress Notes (Signed)
Pre visit review using our clinic review tool, if applicable. No additional management support is needed unless otherwise documented below in the visit note. 

## 2018-04-13 NOTE — Progress Notes (Signed)
Subjective:    Patient ID: [name], male    DOB: 1926/02/03, 84 y.o.   MRN: [id number]  DOS:  04/13/2018 Type of visit - description : rov Here with his daughter. Reports no concerns. No recent falls or problems.   Review of Systems Denies chest pain or difficulty breathing No nausea, vomiting, diarrhea.  No blood in the stools  Past Medical History:  Diagnosis Date  . AAA (abdominal aortic aneurysm) ([place])   . Anemia due to chronic blood loss 11/25/2008   Recurrent over the years EGD and colonoscopy x 2 each 2004 and 2010 without cause    . Atrial fibrillation ([place])   . BPH (benign prostatic hyperplasia)    reports aprocedure (TURP) remotely in [place].Marland KitchenNo futher f/u w/ urology  . CAD (coronary artery disease)   . Diabetes mellitus, type 2 ([place])   . Diverticulosis    left colon  . Erosive gastritis   . Fall 08/08/2016   OUTSIDE AT HOME FACIAL TRAUMA   . GERD (gastroesophageal reflux disease)   . Hyperlipidemia   . Hypertension   . Hypothyroidism   . Insomnia    transient  . Osteopenia    dexa 2-11  . Vitamin B12 deficiency     Past Surgical History:  Procedure Laterality Date  . CARDIAC CATHETERIZATION  01/31/06, 09/25/10, 09-2011  . CATARACT EXTRACTION     right  . CHOLECYSTECTOMY, LAPAROSCOPIC    . COLONOSCOPY     multiple  . CORONARY ARTERY BYPASS GRAFT     1999 stents in 2000  . ESOPHAGOGASTRODUODENOSCOPY     multiple  . GIVENS CAPSULE STUDY N/A 11/04/2012   Procedure: GIVENS CAPSULE STUDY;  Surgeon: Gatha Mayer, MD;  Location: [facility] ENDOSCOPY;  Service: Endoscopy;  Laterality: N/A;  . inguinal herniorrhaphies     bilateral  . LEFT HEART CATHETERIZATION WITH CORONARY ANGIOGRAM N/A 10/04/2011   Procedure: LEFT HEART CATHETERIZATION WITH CORONARY ANGIOGRAM;  Surgeon: Burnell Blanks, MD;  Location: [facility] CATH LAB;  Service: Cardiovascular;  Laterality: N/A;  . pacemaker  [place], 2013   medtronic minix 8341  . PENILE PROSTHESIS IMPLANT  1992  .  PERMANENT PACEMAKER GENERATOR CHANGE N/A 06/05/2011   Procedure: PERMANENT PACEMAKER GENERATOR CHANGE;  Surgeon: Evans Lance, MD;  Location: [facility] CATH LAB;  Service: Cardiovascular;  Laterality: N/A;  . PROSTATECTOMY     transurethral  . right hip replacement    . TRANSTHORACIC ECHOCARDIOGRAM  12/2006    Social History   Socioeconomic History  . Marital status: Legally Separated    Spouse name: Not on file  . Number of children: 5  . Years of education: Not on file  . Highest education level: Not on file  Occupational History  . Occupation: retired    Fish farm manager: RETIRED  Social Needs  . Financial resource strain: Not on file  . Food insecurity:    Worry: Not on file    Inability: Not on file  . Transportation needs:    Medical: Not on file    Non-medical: Not on file  Tobacco Use  . Smoking status: Never Smoker  . Smokeless tobacco: Never Used  Substance and Sexual Activity  . Alcohol use: Yes    Alcohol/week: 1.0 - 2.0 standard drinks    Types: 1 - 2 Glasses of wine per week    Comment: socially   . Drug use: No  . Sexual activity: Not Currently  Lifestyle  . Physical activity:  Days per week: Not on file    Minutes per session: Not on file  . Stress: Not on file  Relationships  . Social connections:    Talks on phone: Not on file    Gets together: Not on file    Attends religious service: Not on file    Active member of club or organization: Not on file    Attends meetings of clubs or organizations: Not on file    Relationship status: Not on file  . Intimate partner violence:    Fear of current or ex partner: Not on file    Emotionally abused: Not on file    Physically abused: Not on file    Forced sexual activity: Not on file  Other Topics Concern  . Not on file  Social History Narrative   Lives by self, independent on ADL, retired. Separated from wife.    Daughters:   [name] ( lives in [place]) [phone]   [name] ( lives out of town)  [phone]             Allergies as of 04/13/2018      Reactions   Ace Inhibitors Cough   Codeine Phosphate Nausea Only   Hydrochlorothiazide W-triamterene Other (See Comments)   CRAMPING      Medication List        Accurate as of 04/13/18 11:59 PM. Always use your most recent med list.          ACCU-CHEK FASTCLIX LANCETS Misc Used to check blood sugars 2x daily.   ACCU-CHEK GUIDE w/Device Kit 1 Device by Does not apply route daily.   acetaminophen 325 MG tablet Commonly known as:  TYLENOL Take 650 mg by mouth every 6 (six) hours as needed for mild pain or moderate pain.   apixaban 5 MG Tabs tablet Commonly known as:  ELIQUIS Take 1 tablet (5 mg total) by mouth 2 (two) times daily.   azelastine 0.1 % nasal spray Commonly known as:  ASTELIN Place 2 sprays into both nostrils at bedtime as needed for rhinitis or allergies. Use in each nostril as directed   digoxin 0.125 MG tablet Commonly known as:  LANOXIN Take 1 tablet (0.125 mg total) by mouth every other day.   dorzolamide-timolol 22.3-6.8 MG/ML ophthalmic solution Commonly known as:  COSOPT Place 1 drop into both eyes 2 (two) times daily.   escitalopram 10 MG tablet Commonly known as:  LEXAPRO Take 1 tablet (10 mg total) by mouth daily.   fenofibrate micronized 134 MG capsule Commonly known as:  LOFIBRA Take 1 capsule (134 mg total) by mouth daily.   glucose blood test strip Used to check blood sugars 2x daily.   Insulin Glargine 100 UNIT/ML Solostar Pen Commonly known as:  LANTUS Inject 38 Units into the skin every morning.   Insulin Pen Needle 31G X 6 MM Misc To use w/ Lantus   latanoprost 0.005 % ophthalmic solution Commonly known as:  XALATAN Place 1 drop into both eyes at bedtime.   levothyroxine 88 MCG tablet Commonly known as:  SYNTHROID, LEVOTHROID Take 1 tablet (88 mcg total) by mouth daily before breakfast.   metoprolol tartrate 25 MG tablet Commonly known as:  LOPRESSOR Take 1 tablet (25 mg  total) by mouth 2 (two) times daily.   mirtazapine 15 MG tablet Commonly known as:  REMERON Take 1 tablet (15 mg total) by mouth at bedtime.   nitroGLYCERIN 0.4 MG SL tablet Commonly known as:  NITROSTAT Place 1  tablet (0.4 mg total) under the tongue every 5 (five) minutes x 3 doses as needed for chest pain. Then contact 911 or go to ER   omeprazole 40 MG capsule Commonly known as:  PRILOSEC Take 1 capsule (40 mg total) by mouth daily.   pantoprazole 40 MG tablet Commonly known as:  PROTONIX Take 1 tablet (40 mg total) by mouth daily before breakfast.   pravastatin 40 MG tablet Commonly known as:  PRAVACHOL Take 2 tablets (80 mg total) by mouth daily.   sitaGLIPtin 100 MG tablet Commonly known as:  JANUVIA Take 1 tablet (100 mg total) by mouth daily.   triamcinolone ointment 0.1 % Commonly known as:  KENALOG   vitamin B-12 1000 MCG tablet Commonly known as:  CYANOCOBALAMIN Take 1 tablet (1,000 mcg total) by mouth daily.   Zoster Vaccine Adjuvanted injection Commonly known as:  SHINGRIX Inject 0.5 mLs into the muscle once for 1 dose.           Objective:   Physical Exam BP 124/64 (BP Location: Left Arm, Patient Position: Sitting, Cuff Size: Small)   Pulse 68   Temp 98.1 F (36.7 C) (Oral)   Resp 16   Ht '5\' 7"'  (1.702 m)   Wt 167 lb 4 oz (75.9 kg)   SpO2 94%   BMI 26.20 kg/m  General:   Well developed, NAD, see BMI.  HEENT:  Normocephalic . Face symmetric, atraumatic Lungs:  CTA B Normal respiratory effort, no intercostal retractions, no accessory muscle use. Heart: seems regular   No pretibial edema bilaterally  Skin: Not pale. Not jaundice Neurologic:  alert & oriented X3.  Speech normal, gait appropriate for age and unassisted Psych--  Cognition and judgment appear intact.  Cooperative with normal attention span and concentration.  Behavior appropriate. No anxious or depressed appearing.    Assessment & Plan:    Assessment DM, + neuropahty  per exam 12-2015 CRI Creat ~ 1.3-1.4 HTN Hyperlipidemia Hypothyroidism Anxiety and depression CV: --CAD -- Dr Harrington Challenger --Atrial fibrillation, PPM --- Dr Lovena Le --AAA: [place] 06-2014 stable, was rec no further imagine  GI: Erosive gastritis, GERD, h/o anemia d/t  chronic blood loss BPH NEURO:  07-2016: Fall, vaso vagal syncope? : SAH, eld asa-warfarin Osteopenia- declined dexa 2015 B 12 deficiency Skin cancer, sees derm  Plan: DM: Per endo, average CBGs 132 HTN: Last BMP at baseline, continue with Metoprolol. Hypothyroidism: Last TSH 4.07, on Synthroid, checking labs Anxiety depression: Continue Lexapro. Preventive care: Shingrix pros and cons discussed, prescription printed RTC 6 months

## 2018-04-14 LAB — TSH: TSH: 5.88 u[IU]/mL — AB (ref 0.35–4.50)

## 2018-04-14 NOTE — Assessment & Plan Note (Signed)
DM: Per endo, average CBGs 132 HTN: Last BMP at baseline, continue with Metoprolol. Hypothyroidism: Last TSH 4.07, on Synthroid, checking labs Anxiety depression: Continue Lexapro. Preventive care: Shingrix pros and cons discussed, prescription printed RTC 6 months

## 2018-04-17 MED ORDER — LEVOTHYROXINE SODIUM 100 MCG PO TABS: 100.0000 ug | ORAL_TABLET | Freq: Every day | ORAL | 1 refills | Status: DC

## 2018-04-17 NOTE — Addendum Note (Signed)
Addended byDamita Dunnings D on: 04/17/2018 10:15 AM   Modules accepted: Orders

## 2018-04-22 ENCOUNTER — Ambulatory Visit (INDEPENDENT_AMBULATORY_CARE_PROVIDER_SITE_OTHER): Payer: Medicare HMO | Admitting: *Deleted

## 2018-04-22 DIAGNOSIS — I4821 Permanent atrial fibrillation: Secondary | ICD-10-CM

## 2018-04-24 LAB — CUP PACEART INCLINIC DEVICE CHECK
Brady Statistic RV Percent Paced: 90 %
Implantable Lead Implant Date: 19950413
Implantable Pulse Generator Implant Date: 20130123
Lead Channel Impedance Value: 552 Ohm
Lead Channel Pacing Threshold Amplitude: 0.75 V
Lead Channel Pacing Threshold Pulse Width: 0.4 ms
Lead Channel Setting Pacing Amplitude: 2.5 V
Lead Channel Setting Pacing Pulse Width: 0.4 ms
MDC IDC LEAD LOCATION: 753860
MDC IDC MSMT BATTERY IMPEDANCE: 4064 Ohm
MDC IDC MSMT BATTERY REMAINING LONGEVITY: 14 mo
MDC IDC MSMT BATTERY VOLTAGE: 2.69 V
MDC IDC MSMT LEADCHNL RA IMPEDANCE VALUE: 0 Ohm
MDC IDC SESS DTM: 20191211194026
MDC IDC SET LEADCHNL RV SENSING SENSITIVITY: 4 mV

## 2018-04-24 NOTE — Progress Notes (Signed)
Pacemaker battery check Estimated longevity 14 months, <2-27 months. ROV 07/01/2018

## 2018-04-29 DIAGNOSIS — R69 Illness, unspecified: Secondary | ICD-10-CM | POA: Diagnosis not present

## 2018-05-14 ENCOUNTER — Other Ambulatory Visit: Payer: Self-pay | Admitting: Internal Medicine

## 2018-05-18 ENCOUNTER — Ambulatory Visit (INDEPENDENT_AMBULATORY_CARE_PROVIDER_SITE_OTHER): Payer: Medicare HMO | Admitting: Endocrinology

## 2018-05-18 ENCOUNTER — Encounter: Payer: Self-pay | Admitting: Endocrinology

## 2018-05-18 VITALS — BP 128/72 | HR 87 | Ht 67.0 in | Wt 168.0 lb

## 2018-05-18 DIAGNOSIS — E118 Type 2 diabetes mellitus with unspecified complications: Secondary | ICD-10-CM

## 2018-05-18 LAB — POCT GLYCOSYLATED HEMOGLOBIN (HGB A1C): Hemoglobin A1C: 7.7 % — AB (ref 4.0–5.6)

## 2018-05-18 NOTE — Progress Notes (Signed)
Subjective:    Patient ID: [name], male    DOB: 1925-10-31, 85 y.o.   MRN: [id number]  HPI Pt returns for f/u of diabetes mellitus: DM type: Insulin-requiring type 2 Dx'ed: 6294 Complications: CAD and renal insuff Therapy: insulin since 2014, and januvia DKA: never Severe hypoglycemia: never Pancreatitis: never Pancreatic imaging: normal on 2014 [place] Other: He gives his own insulin, but does not check cbg's; due to frail elderly state, he is not a candidate for multiple daily injections. Interval history: no cbg record, but states cbg's are in the low to mid-100's.  It is in general higher as the day goes on. He takes lantus, 38/d.   Past Medical History:  Diagnosis Date  . AAA (abdominal aortic aneurysm) ([place])   . Anemia due to chronic blood loss 11/25/2008   Recurrent over the years EGD and colonoscopy x 2 each 2004 and 2010 without cause    . Atrial fibrillation ([place])   . BPH (benign prostatic hyperplasia)    reports aprocedure (TURP) remotely in [place].Marland KitchenNo futher f/u w/ urology  . CAD (coronary artery disease)   . Diabetes mellitus, type 2 ([place])   . Diverticulosis    left colon  . Erosive gastritis   . Fall 08/08/2016   OUTSIDE AT HOME FACIAL TRAUMA   . GERD (gastroesophageal reflux disease)   . Hyperlipidemia   . Hypertension   . Hypothyroidism   . Insomnia    transient  . Osteopenia    dexa 2-11  . Vitamin B12 deficiency     Past Surgical History:  Procedure Laterality Date  . CARDIAC CATHETERIZATION  01/31/06, 09/25/10, 09-2011  . CATARACT EXTRACTION     right  . CHOLECYSTECTOMY, LAPAROSCOPIC    . COLONOSCOPY     multiple  . CORONARY ARTERY BYPASS GRAFT     1999 stents in 2000  . ESOPHAGOGASTRODUODENOSCOPY     multiple  . GIVENS CAPSULE STUDY N/A 11/04/2012   Procedure: GIVENS CAPSULE STUDY;  Surgeon: Gatha Mayer, MD;  Location: [facility] ENDOSCOPY;  Service: Endoscopy;  Laterality: N/A;  . inguinal herniorrhaphies     bilateral  . LEFT HEART CATHETERIZATION  WITH CORONARY ANGIOGRAM N/A 10/04/2011   Procedure: LEFT HEART CATHETERIZATION WITH CORONARY ANGIOGRAM;  Surgeon: Burnell Blanks, MD;  Location: [facility] CATH LAB;  Service: Cardiovascular;  Laterality: N/A;  . pacemaker  [place], 2013   medtronic minix 8341  . PENILE PROSTHESIS IMPLANT  1992  . PERMANENT PACEMAKER GENERATOR CHANGE N/A 06/05/2011   Procedure: PERMANENT PACEMAKER GENERATOR CHANGE;  Surgeon: Evans Lance, MD;  Location: [facility] CATH LAB;  Service: Cardiovascular;  Laterality: N/A;  . PROSTATECTOMY     transurethral  . right hip replacement    . TRANSTHORACIC ECHOCARDIOGRAM  12/2006    Social History   Socioeconomic History  . Marital status: Legally Separated    Spouse name: Not on file  . Number of children: 5  . Years of education: Not on file  . Highest education level: Not on file  Occupational History  . Occupation: retired    Fish farm manager: RETIRED  Social Needs  . Financial resource strain: Not on file  . Food insecurity:    Worry: Not on file    Inability: Not on file  . Transportation needs:    Medical: Not on file    Non-medical: Not on file  Tobacco Use  . Smoking status: Never Smoker  . Smokeless tobacco: Never Used  Substance and Sexual Activity  .  Alcohol use: Yes    Alcohol/week: 1.0 - 2.0 standard drinks    Types: 1 - 2 Glasses of wine per week    Comment: socially   . Drug use: No  . Sexual activity: Not Currently  Lifestyle  . Physical activity:    Days per week: Not on file    Minutes per session: Not on file  . Stress: Not on file  Relationships  . Social connections:    Talks on phone: Not on file    Gets together: Not on file    Attends religious service: Not on file    Active member of club or organization: Not on file    Attends meetings of clubs or organizations: Not on file    Relationship status: Not on file  . Intimate partner violence:    Fear of current or ex partner: Not on file    Emotionally abused: Not on file     Physically abused: Not on file    Forced sexual activity: Not on file  Other Topics Concern  . Not on file  Social History Narrative   Lives by self, independent on ADL, retired. Separated from wife.    Daughters:   [name] ( lives in [place]) [phone]   [name] ( lives out of town)  [phone]          Current Outpatient Medications on File Prior to Visit  Medication Sig Dispense Refill  . ACCU-CHEK FASTCLIX LANCETS MISC Used to check blood sugars 2x daily. 102 each 11  . acetaminophen (TYLENOL) 325 MG tablet Take 650 mg by mouth every 6 (six) hours as needed for mild pain or moderate pain.     [name] apixaban (ELIQUIS) 5 MG TABS tablet Take 1 tablet (5 mg total) by mouth 2 (two) times daily. 180 tablet 1  . azelastine (ASTELIN) 0.1 % nasal spray Place 2 sprays into both nostrils at bedtime as needed for rhinitis or allergies. Use in each nostril as directed 30 mL 12  . Blood Glucose Monitoring Suppl (ACCU-CHEK GUIDE) w/Device KIT 1 Device by Does not apply route daily. 1 kit 0  . digoxin (LANOXIN) 0.125 MG tablet Take 1 tablet (0.125 mg total) by mouth every other day. 45 tablet 2  . dorzolamide-timolol (COSOPT) 22.3-6.8 MG/ML ophthalmic solution Place 1 drop into both eyes 2 (two) times daily.    [name] escitalopram (LEXAPRO) 10 MG tablet Take 1 tablet (10 mg total) by mouth daily. 90 tablet 1  . fenofibrate micronized (LOFIBRA) 134 MG capsule Take 1 capsule (134 mg total) by mouth daily. 90 capsule 1  . glucose blood (ACCU-CHEK GUIDE) test strip Used to check blood sugars 2x daily. 100 each 12  . Insulin Glargine (LANTUS SOLOSTAR) 100 UNIT/ML Solostar Pen Inject 38 Units into the skin every morning. 5 pen 11  . Insulin Pen Needle 31G X 6 MM MISC To use w/ Lantus 100 each 12  . latanoprost (XALATAN) 0.005 % ophthalmic solution Place 1 drop into both eyes at bedtime.    [name] levothyroxine (SYNTHROID) 100 MCG tablet Take 1 tablet (100 mcg total) by mouth daily before breakfast. 30 tablet 1  .  metoprolol tartrate (LOPRESSOR) 25 MG tablet Take 1 tablet (25 mg total) by mouth 2 (two) times daily. 180 tablet 1  . mirtazapine (REMERON) 15 MG tablet Take 1 tablet (15 mg total) by mouth at bedtime. 90 tablet 1  . nitroGLYCERIN (NITROSTAT) 0.4 MG SL tablet Place 1 tablet (0.4 mg total)  under the tongue every 5 (five) minutes x 3 doses as needed for chest pain. Then contact 911 or go to ER 25 tablet 3  . omeprazole (PRILOSEC) 40 MG capsule Take 1 capsule (40 mg total) by mouth daily. 90 capsule 3  . pantoprazole (PROTONIX) 40 MG tablet Take 1 tablet (40 mg total) by mouth daily before breakfast. 90 tablet 3  . pravastatin (PRAVACHOL) 40 MG tablet Take 2 tablets (80 mg total) by mouth daily. 180 tablet 1  . sitaGLIPtin (JANUVIA) 100 MG tablet Take 1 tablet (100 mg total) by mouth daily. 90 tablet 1  . triamcinolone ointment (KENALOG) 0.1 %     . vitamin B-12 (CYANOCOBALAMIN) 1000 MCG tablet Take 1 tablet (1,000 mcg total) by mouth daily. 90 tablet 2   No current facility-administered medications on file prior to visit.     Allergies  Allergen Reactions  . Ace Inhibitors Cough  . Codeine Phosphate Nausea Only  . Hydrochlorothiazide W-Triamterene Other (See Comments)    CRAMPING    Family History  Problem Relation Age of Onset  . Alzheimer's disease Mother   . Heart failure Father   . CVA Father   . Coronary artery disease Brother        cabg  . Alzheimer's disease Sister   . Mental illness Sister   . Alzheimer's disease Sister   . Other Sister        lung problems  . Diabetes Other        several siblings  . Prostate cancer Neg Hx   . Colon cancer Neg Hx   . Esophageal cancer Neg Hx   . Stomach cancer Neg Hx   . Rectal cancer Neg Hx     BP 128/72 (BP Location: Right Arm, Patient Position: Sitting, Cuff Size: Normal)   Pulse 87   Ht '5\' 7"'  (1.702 m)   Wt 168 lb (76.2 kg)   SpO2 93%   BMI 26.31 kg/m    Review of Systems He denies hypoglycemia.      Objective:    Physical Exam VITAL SIGNS:  See vs page GENERAL: no distress Pulses: foot pulses are intact bilaterally.   MSK: no deformity of the feet or ankles.  CV: no edema of the legs or ankles Skin:  no ulcer on the feet or ankles.  normal temp on the feet and ankles, but there is cyanosis bilaterally.  Old healed surgical scar (vein harvest) at the right leg.  Neuro: sensation is intact to touch on the feet and ankles.  Ext: long toenails  Lab Results  Component Value Date   HGBA1C 7.7 (A) 05/18/2018   Lab Results  Component Value Date   CREATININE 1.22 12/15/2017   BUN 14 12/15/2017   NA 139 12/15/2017   K 4.3 12/15/2017   CL 107 12/15/2017   CO2 25 12/15/2017        Assessment & Plan:  Insulin-requiring type 2 DM, with renal insuff: this is the best control this pt should aim for, given this regimen, which does match insulin to his changing needs throughout the day. Frail elderly state: he is not a candidate for aggressive glycemic control. Long toenails: pt says he cannot trim these himself.     Patient Instructions  check your blood sugar twice a day.  vary the time of day when you check, between before the 3 meals, and at bedtime.  also check if you have symptoms of your blood sugar being too high or  too low.  please keep a record of the readings and bring it to your next appointment here (or you can bring the meter itself).  You can write it on any piece of paper.  please call us sooner if your blood sugar goes below 70, or if you have a lot of readings over 200. Please continue the same medications.  Please see a foot specialist.  you will receive a phone call, about a day and time for an appointment Please come back for a follow-up appointment in 3-4 months.

## 2018-05-18 NOTE — Patient Instructions (Addendum)
check your blood sugar twice a day.  vary the time of day when you check, between before the 3 meals, and at bedtime.  also check if you have symptoms of your blood sugar being too high or too low.  please keep a record of the readings and bring it to your next appointment here (or you can bring the meter itself).  You can write it on any piece of paper.  please call us sooner if your blood sugar goes below 70, or if you have a lot of readings over 200. Please continue the same medications.  Please see a foot specialist.  you will receive a phone call, about a day and time for an appointment Please come back for a follow-up appointment in 3-4 months.

## 2018-07-01 ENCOUNTER — Encounter: Payer: Self-pay | Admitting: Internal Medicine

## 2018-07-01 ENCOUNTER — Ambulatory Visit (INDEPENDENT_AMBULATORY_CARE_PROVIDER_SITE_OTHER): Payer: Medicare HMO | Admitting: Internal Medicine

## 2018-07-01 VITALS — BP 156/78 | HR 80 | Ht 67.0 in | Wt 164.4 lb

## 2018-07-01 DIAGNOSIS — I4821 Permanent atrial fibrillation: Secondary | ICD-10-CM

## 2018-07-01 DIAGNOSIS — I442 Atrioventricular block, complete: Secondary | ICD-10-CM

## 2018-07-01 NOTE — Patient Instructions (Signed)
Medication Instructions:  Your physician recommends that you continue on your current medications as directed. Please refer to the Current Medication list given to you today.  * If you need a refill on your cardiac medications before your next appointment, please call your pharmacy.   Labwork: None ordered  Testing/Procedures: None ordered  Follow-Up: We will enroll you in home pacemaker monitoring.  Your physician wants you to follow-up in: 1 year with Dr. Lovena Le. You will receive a reminder letter in the mail two months in advance. If you don't receive a letter, please call our office to schedule the follow-up appointment.   Thank you for choosing [facility]!!

## 2018-07-01 NOTE — Progress Notes (Signed)
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  Since last being seen in our clinic, the patient reports doing very well.  Today, he denies symptoms of palpitations, chest pain, shortness of breath,  lower extremity edema, dizziness, presyncope, or syncope.  The patient is otherwise without complaint today.   Past Medical History:  Diagnosis Date  . AAA (abdominal aortic aneurysm) ([place])   . Anemia due to chronic blood loss 11/25/2008   Recurrent over the years EGD and colonoscopy x 2 each 2004 and 2010 without cause    . Atrial fibrillation ([place])   . BPH (benign prostatic hyperplasia)    reports aprocedure (TURP) remotely in [place].Marland KitchenNo futher f/u w/ urology  . CAD (coronary artery disease)   . Diabetes mellitus, type 2 ([place])   . Diverticulosis    left colon  . Erosive gastritis   . Fall 08/08/2016   OUTSIDE AT HOME FACIAL TRAUMA   . GERD (gastroesophageal reflux disease)   . Hyperlipidemia   . Hypertension   . Hypothyroidism   . Insomnia    transient  . Osteopenia    dexa 2-11  . Vitamin B12 deficiency    Past Surgical History:  Procedure Laterality Date  . CARDIAC CATHETERIZATION  01/31/06, 09/25/10, 09-2011  . CATARACT EXTRACTION     right  . CHOLECYSTECTOMY, LAPAROSCOPIC    . COLONOSCOPY     multiple  . CORONARY ARTERY BYPASS GRAFT     1999 stents in 2000  . ESOPHAGOGASTRODUODENOSCOPY     multiple  . GIVENS CAPSULE STUDY N/A 11/04/2012   Procedure: GIVENS CAPSULE STUDY;  Surgeon: Gatha Mayer, MD;  Location: [facility];  Service: Endoscopy;  Laterality: N/A;  . inguinal herniorrhaphies     bilateral  . LEFT HEART CATHETERIZATION WITH CORONARY ANGIOGRAM N/A 10/04/2011   Procedure: LEFT HEART CATHETERIZATION WITH CORONARY ANGIOGRAM;  Surgeon: Burnell Blanks, MD;  Location: [facility] CATH LAB;  Service: Cardiovascular;  Laterality: N/A;  . pacemaker  [place], 2013   medtronic  minix [id number]  . PENILE PROSTHESIS IMPLANT  1992  . PERMANENT PACEMAKER GENERATOR CHANGE N/A 06/05/2011   Procedure: PERMANENT PACEMAKER GENERATOR CHANGE;  Surgeon: Evans Lance, MD;  Location: [facility] CATH LAB;  Service: Cardiovascular;  Laterality: N/A;  . PROSTATECTOMY     transurethral  . right hip replacement    . TRANSTHORACIC ECHOCARDIOGRAM  12/2006    ROS- all systems are reviewed and negative except as per HPI above  Current Outpatient Medications  Medication Sig Dispense Refill  . ACCU-CHEK FASTCLIX LANCETS MISC Used to check blood sugars 2x daily. 102 each 11  . acetaminophen (TYLENOL) 325 MG tablet Take 650 mg by mouth every 6 (six) hours as needed for mild pain or moderate pain.     [name] apixaban (ELIQUIS) 5 MG TABS tablet Take 1 tablet (5 mg total) by mouth 2 (two) times daily. 180 tablet 1  . azelastine (ASTELIN) 0.1 % nasal spray Place 2 sprays into both nostrils at bedtime as needed for rhinitis or allergies. Use in each nostril as directed 30 mL 12  . Blood Glucose Monitoring Suppl (ACCU-CHEK GUIDE) w/Device KIT 1 Device by Does not apply route daily. 1 kit 0  . digoxin (LANOXIN) 0.125 MG tablet Take 1 tablet (0.125 mg total) by mouth every other day. 45 tablet 2  . dorzolamide-timolol (COSOPT) 22.3-6.8 MG/ML ophthalmic  solution Place 1 drop into both eyes 2 (two) times daily.    [name] escitalopram (LEXAPRO) 10 MG tablet Take 1 tablet (10 mg total) by mouth daily. 90 tablet 1  . fenofibrate micronized (LOFIBRA) 134 MG capsule Take 1 capsule (134 mg total) by mouth daily. 90 capsule 1  . glucose blood (ACCU-CHEK GUIDE) test strip Used to check blood sugars 2x daily. 100 each 12  . Insulin Glargine (LANTUS SOLOSTAR) 100 UNIT/ML Solostar Pen Inject 38 Units into the skin every morning. 5 pen 11  . Insulin Pen Needle 31G X 6 MM MISC To use w/ Lantus 100 each 12  . latanoprost (XALATAN) 0.005 % ophthalmic solution Place 1 drop into both eyes at bedtime.    [name] levothyroxine (SYNTHROID) 100 MCG  tablet Take 1 tablet (100 mcg total) by mouth daily before breakfast. 30 tablet 1  . metoprolol tartrate (LOPRESSOR) 25 MG tablet Take 1 tablet (25 mg total) by mouth 2 (two) times daily. 180 tablet 1  . mirtazapine (REMERON) 15 MG tablet Take 1 tablet (15 mg total) by mouth at bedtime. 90 tablet 1  . nitroGLYCERIN (NITROSTAT) 0.4 MG SL tablet Place 1 tablet (0.4 mg total) under the tongue every 5 (five) minutes x 3 doses as needed for chest pain. Then contact 911 or go to ER 25 tablet 3  . omeprazole (PRILOSEC) 40 MG capsule Take 1 capsule (40 mg total) by mouth daily. 90 capsule 3  . pantoprazole (PROTONIX) 40 MG tablet Take 1 tablet (40 mg total) by mouth daily before breakfast. 90 tablet 3  . pravastatin (PRAVACHOL) 40 MG tablet Take 2 tablets (80 mg total) by mouth daily. 180 tablet 1  . sitaGLIPtin (JANUVIA) 100 MG tablet Take 1 tablet (100 mg total) by mouth daily. 90 tablet 1  . triamcinolone ointment (KENALOG) 0.1 %     . vitamin B-12 (CYANOCOBALAMIN) 1000 MCG tablet Take 1 tablet (1,000 mcg total) by mouth daily. 90 tablet 2   No current facility-administered medications for this visit.     Physical Exam: Vitals:   07/01/18 1618  BP: (!) 156/78  Pulse: 80  SpO2: 97%  Weight: 164 lb 6.4 oz (74.6 kg)  Height: '5\' 7"'  (1.702 m)    GEN- The patient is well appearing, alert and oriented x 3 today.   Head- normocephalic, atraumatic Eyes-  Sclera clear, conjunctiva pink Ears- hearing intact Oropharynx- clear Lungs- Clear to ausculation bilaterally, normal work of breathing Chest- pacemaker pocket is well healed Heart- Regular rate and rhythm (paced) GI- soft, NT, ND, + BS Extremities- no clubbing, cyanosis, or edema  Pacemaker interrogation- reviewed in detail today,  See PACEART report  ekg tracing ordered today is personally reviewed and shows afib, V paced  Assessment and Plan:  1. Symptomatic complete heart block Normal pacemaker function See Pace Art report No  changes today He has about 13 months estimated battery remaining.  Will enroll in remote monitoring and follow closely.  He has an 06 header which will be necessary to know at time of generator change.  2. Permanent afib Rate controlled chads2vasc score is at least 5 He is on eliquis  [facility]link Return to see Dr Lovena Le in a year  Thompson Grayer MD, [facility] 07/01/2018 4:33 PM

## 2018-07-03 LAB — CUP PACEART INCLINIC DEVICE CHECK
Battery Impedance: 4181 Ohm
Battery Remaining Longevity: 13 mo
Battery Voltage: 2.69 V
Brady Statistic RV Percent Paced: 89 %
Date Time Interrogation Session: 20200219221220
Implantable Lead Implant Date: 19950413
Implantable Lead Location: 753860
Lead Channel Impedance Value: 0 Ohm
Lead Channel Impedance Value: 504 Ohm
Lead Channel Pacing Threshold Amplitude: 0.75 V
Lead Channel Pacing Threshold Amplitude: 0.75 V
Lead Channel Pacing Threshold Pulse Width: 0.4 ms
Lead Channel Pacing Threshold Pulse Width: 0.4 ms
Lead Channel Setting Pacing Amplitude: 2.5 V
Lead Channel Setting Sensing Sensitivity: 2.8 mV
MDC IDC MSMT LEADCHNL RV SENSING INTR AMPL: 11.2 mV
MDC IDC PG IMPLANT DT: 20130123
MDC IDC SET LEADCHNL RV PACING PULSEWIDTH: 0.4 ms

## 2018-07-08 DIAGNOSIS — H401133 Primary open-angle glaucoma, bilateral, severe stage: Secondary | ICD-10-CM | POA: Diagnosis not present

## 2018-07-13 DIAGNOSIS — R69 Illness, unspecified: Secondary | ICD-10-CM | POA: Diagnosis not present

## 2018-07-15 ENCOUNTER — Telehealth: Payer: Self-pay | Admitting: *Deleted

## 2018-07-15 NOTE — Telephone Encounter (Signed)
-----   Message from Thompson Grayer, MD sent at 07/01/2018  4:43 PM EST ----- Erlene Quan gave a remote transmitter and we discussed importance of remotes as his battery is approaching ERI. Please make sure remotes are coming through

## 2018-07-15 NOTE — Telephone Encounter (Signed)
Per [facility], new monitor was shipped on 07/02/18.  Attempted to reach patient to discuss monitor setup. No answer, mailbox full.

## 2018-07-16 ENCOUNTER — Encounter: Payer: Self-pay | Admitting: Internal Medicine

## 2018-07-17 NOTE — Telephone Encounter (Signed)
I called but did not get an answer and patient mailbox is full.

## 2018-07-23 NOTE — Telephone Encounter (Signed)
Spoke w/ pt and attempted to help him send a manual transmission about 2 minutes into the phone call pt stated "ma'am let me call you back" and he disconnected the call.

## 2018-07-27 NOTE — Telephone Encounter (Signed)
Left a voicemail for the pt to send a manual transmission with his home monitor. This is the 4th attempt to contact the patient to send a manual transmission with his home monitor.

## 2018-08-06 ENCOUNTER — Encounter: Payer: Self-pay | Admitting: Cardiology

## 2018-08-06 NOTE — Telephone Encounter (Signed)
Letter printed to be mailed.  °

## 2018-08-06 NOTE — Telephone Encounter (Signed)
Letter sent certified.  Chanetta Marshall, NP 08/06/2018 1:53 PM

## 2018-08-31 ENCOUNTER — Other Ambulatory Visit: Payer: Self-pay | Admitting: Internal Medicine

## 2018-09-01 ENCOUNTER — Telehealth: Payer: Self-pay | Admitting: Internal Medicine

## 2018-09-01 NOTE — Telephone Encounter (Signed)
(  He does not meet criteria to take a reduced dose based on age, weight and kidney function) To my knowledge, the patient has always taken Eliquis 5 mg twice a day.  That is my recommendation.

## 2018-09-01 NOTE — Telephone Encounter (Signed)
[facility] called and stated that they need clarification. The patient is needing a refill on Eliquis but he told the pharmacy he takes this only once daily and the prior RX states BID. Please contact pharmacy

## 2018-09-01 NOTE — Telephone Encounter (Signed)
Please advise 

## 2018-09-02 NOTE — Telephone Encounter (Signed)
Spoke w/ [facility]- informed Pt should be taking bid.

## 2018-09-03 ENCOUNTER — Telehealth: Payer: Self-pay | Admitting: *Deleted

## 2018-09-03 NOTE — Telephone Encounter (Signed)
Spoke with patient. Advised that [facility]link transmission he sent this morning has not come through. Pt received check mark on his end. Asked patient to confirm monitor SN. He agrees to call us back once he has a chance to look at the monitor.

## 2018-09-03 NOTE — Telephone Encounter (Signed)
Spoke with patient's daughter, [name] (DPR). Advised of recommendations to start remote monitoring to monitor battery more closely. [name] does see the patient and will try to get a transmission sent within the next week. Gave direct DC number for assistance. Leigh requests I also call patient--phone number has been updated on file.  Able to reach patient at new number. Assisted him with sending a transmission. Advised next remote will be scheduled for 11/2018 (depending on battery). Pt verbalizes understanding and thanked me for my call.

## 2018-09-04 NOTE — Telephone Encounter (Signed)
Spoke w/ pt and got the serial number to his home monitor. Instructed pt to send manual transmission w/ the monitor. Instructed pt that on 10/08/2018 he needs to send a manual transmission like we just done any time that day. Pt verbalized understanding. Transmission has not been received I will continue to look for the transmission. If it is not received by Monday I will call tech support to figure out why it has not sent.

## 2018-09-07 NOTE — Telephone Encounter (Signed)
Patient's device SN was incorrect in [facility]. Updated SN, transmissions available, including most recent transmissions from 4/23 and 4/24. Normal device function, no alerts noted.

## 2018-09-16 ENCOUNTER — Encounter: Payer: Self-pay | Admitting: Endocrinology

## 2018-09-16 ENCOUNTER — Other Ambulatory Visit: Payer: Self-pay

## 2018-09-16 ENCOUNTER — Ambulatory Visit (INDEPENDENT_AMBULATORY_CARE_PROVIDER_SITE_OTHER): Payer: Medicare HMO | Admitting: Endocrinology

## 2018-09-16 VITALS — BP 152/82 | HR 89 | Ht 67.0 in | Wt 161.6 lb

## 2018-09-16 DIAGNOSIS — E118 Type 2 diabetes mellitus with unspecified complications: Secondary | ICD-10-CM | POA: Diagnosis not present

## 2018-09-16 LAB — POCT GLYCOSYLATED HEMOGLOBIN (HGB A1C): Hemoglobin A1C: 8.2 % — AB (ref 4.0–5.6)

## 2018-09-16 NOTE — Patient Instructions (Addendum)
check your blood sugar twice a day.  vary the time of day when you check, between before the 3 meals, and at bedtime.  also check if you have symptoms of your blood sugar being too high or too low.  please keep a record of the readings and bring it to your next appointment here (or you can bring the meter itself).  You can write it on any piece of paper.  please call us sooner if your blood sugar goes below 70, or if you have a lot of readings over 200. Please continue the same medications.  Please come back for a follow-up appointment in 3-4 months.   When you see Dr Larose Kells next month, you should have your blood pressure rechecked.

## 2018-09-16 NOTE — Progress Notes (Signed)
Subjective:    Patient ID: [name], male    DOB: 10-23-1925, 85 y.o.   MRN: [id number]  HPI Pt returns for f/u of diabetes mellitus: DM type: Insulin-requiring type 2 Dx'ed: 0100 Complications: CAD and renal insuff Therapy: insulin since 2014, and januvia DKA: never Severe hypoglycemia: never Pancreatitis: never Pancreatic imaging: normal on 2014 [place] Other: He gives his own insulin, and checks his own cbg's; due to frail elderly state, he is not a candidate for multiple daily injections.  Interval history: no cbg record, but states cbg varies from 120-160.  He checks fasting only.  pt states he feels well in general.    Past Medical History:  Diagnosis Date  . AAA (abdominal aortic aneurysm) ([place])   . Anemia due to chronic blood loss 11/25/2008   Recurrent over the years EGD and colonoscopy x 2 each 2004 and 2010 without cause    . Atrial fibrillation ([place])   . BPH (benign prostatic hyperplasia)    reports aprocedure (TURP) remotely in [place].Marland KitchenNo futher f/u w/ urology  . CAD (coronary artery disease)   . Diabetes mellitus, type 2 ([place])   . Diverticulosis    left colon  . Erosive gastritis   . Fall 08/08/2016   OUTSIDE AT HOME FACIAL TRAUMA   . GERD (gastroesophageal reflux disease)   . Hyperlipidemia   . Hypertension   . Hypothyroidism   . Insomnia    transient  . Osteopenia    dexa 2-11  . Vitamin B12 deficiency     Past Surgical History:  Procedure Laterality Date  . CARDIAC CATHETERIZATION  01/31/06, 09/25/10, 09-2011  . CATARACT EXTRACTION     right  . CHOLECYSTECTOMY, LAPAROSCOPIC    . COLONOSCOPY     multiple  . CORONARY ARTERY BYPASS GRAFT     1999 stents in 2000  . ESOPHAGOGASTRODUODENOSCOPY     multiple  . GIVENS CAPSULE STUDY N/A 11/04/2012   Procedure: GIVENS CAPSULE STUDY;  Surgeon: Gatha Mayer, MD;  Location: [facility] ENDOS[facility];  Service: Endoscopy;  Laterality: N/A;  . inguinal herniorrhaphies     bilateral  . LEFT HEART CATHETERIZATION WITH CORONARY  ANGIOGRAM N/A 10/04/2011   Procedure: LEFT HEART CATHETERIZATION WITH CORONARY ANGIOGRAM;  Surgeon: Burnell Blanks, MD;  Location: [facility] CATH LAB;  Service: Cardiovascular;  Laterality: N/A;  . pacemaker  [place], 2013   medtronic minix [id number]  . PENILE PROSTHESIS IMPLANT  1992  . PERMANENT PACEMAKER GENERATOR CHANGE N/A 06/05/2011   Procedure: PERMANENT PACEMAKER GENERATOR CHANGE;  Surgeon: Evans Lance, MD;  Location: [facility] CATH LAB;  Service: Cardiovascular;  Laterality: N/A;  . PROSTATECTOMY     transurethral  . right hip replacement    . TRANSTHORACIC ECHOCARDIOGRAM  12/2006    Social History   Socioeconomic History  . Marital status: Legally Separated    Spouse name: Not on file  . Number of children: 5  . Years of education: Not on file  . Highest education level: Not on file  Occupational History  . Occupation: retired    Fish farm manager: RETIRED  Social Needs  . Financial resource strain: Not on file  . Food insecurity:    Worry: Not on file    Inability: Not on file  . Transportation needs:    Medical: Not on file    Non-medical: Not on file  Tobacco Use  . Smoking status: Never Smoker  . Smokeless tobacco: Never Used  Substance and Sexual Activity  . Alcohol use:  Yes    Alcohol/week: 1.0 - 2.0 standard drinks    Types: 1 - 2 Glasses of wine per week    Comment: socially   . Drug use: No  . Sexual activity: Not Currently  Lifestyle  . Physical activity:    Days per week: Not on file    Minutes per session: Not on file  . Stress: Not on file  Relationships  . Social connections:    Talks on phone: Not on file    Gets together: Not on file    Attends religious service: Not on file    Active member of club or organization: Not on file    Attends meetings of clubs or organizations: Not on file    Relationship status: Not on file  . Intimate partner violence:    Fear of current or ex partner: Not on file    Emotionally abused: Not on file    Physically  abused: Not on file    Forced sexual activity: Not on file  Other Topics Concern  . Not on file  Social History Narrative   Lives by self, independent on ADL, retired. Separated from wife.    Daughters:   [name] ( lives in [place]) [phone]   [name] ( lives out of town)  [phone]          Current Outpatient Medications on File Prior to Visit  Medication Sig Dispense Refill  . ACCU-CHEK FASTCLIX LANCETS MISC Used to check blood sugars 2x daily. 102 each 11  . acetaminophen (TYLENOL) 325 MG tablet Take 650 mg by mouth every 6 (six) hours as needed for mild pain or moderate pain.     [name] apixaban (ELIQUIS) 5 MG TABS tablet Take 1 tablet (5 mg total) by mouth 2 (two) times daily. 180 tablet 1  . azelastine (ASTELIN) 0.1 % nasal spray Place 2 sprays into both nostrils at bedtime as needed for rhinitis or allergies. Use in each nostril as directed 30 mL 12  . Blood Glucose Monitoring Suppl (ACCU-CHEK GUIDE) w/Device KIT 1 Device by Does not apply route daily. 1 kit 0  . digoxin (LANOXIN) 0.125 MG tablet Take 1 tablet (0.125 mg total) by mouth every other day. 45 tablet 2  . dorzolamide-timolol (COSOPT) 22.3-6.8 MG/ML ophthalmic solution Place 1 drop into both eyes 2 (two) times daily.    [name] escitalopram (LEXAPRO) 10 MG tablet Take 1 tablet (10 mg total) by mouth daily. 90 tablet 1  . fenofibrate micronized (LOFIBRA) 134 MG capsule Take 1 capsule (134 mg total) by mouth daily. 90 capsule 1  . glucose blood (ACCU-CHEK GUIDE) test strip Used to check blood sugars 2x daily. 100 each 12  . Insulin Glargine (LANTUS SOLOSTAR) 100 UNIT/ML Solostar Pen Inject 38 Units into the skin every morning. 5 pen 11  . Insulin Pen Needle 31G X 6 MM MISC To use w/ Lantus 100 each 12  . latanoprost (XALATAN) 0.005 % ophthalmic solution Place 1 drop into both eyes at bedtime.    [name] levothyroxine (SYNTHROID) 100 MCG tablet Take 1 tablet (100 mcg total) by mouth daily before breakfast. 30 tablet 1  . metoprolol  tartrate (LOPRESSOR) 25 MG tablet Take 1 tablet (25 mg total) by mouth 2 (two) times daily. 180 tablet 1  . mirtazapine (REMERON) 15 MG tablet Take 1 tablet (15 mg total) by mouth at bedtime. 90 tablet 1  . nitroGLYCERIN (NITROSTAT) 0.4 MG SL tablet Place 1 tablet (0.4 mg total) under the  tongue every 5 (five) minutes x 3 doses as needed for chest pain. Then contact 911 or go to ER 25 tablet 3  . omeprazole (PRILOSEC) 40 MG capsule Take 1 capsule (40 mg total) by mouth daily. 90 capsule 3  . pantoprazole (PROTONIX) 40 MG tablet Take 1 tablet (40 mg total) by mouth daily before breakfast. 90 tablet 3  . pravastatin (PRAVACHOL) 40 MG tablet Take 2 tablets (80 mg total) by mouth daily. 180 tablet 1  . sitaGLIPtin (JANUVIA) 100 MG tablet Take 1 tablet (100 mg total) by mouth daily. 90 tablet 1  . triamcinolone ointment (KENALOG) 0.1 %     . vitamin B-12 (CYANOCOBALAMIN) 1000 MCG tablet Take 1 tablet (1,000 mcg total) by mouth daily. 90 tablet 2   No current facility-administered medications on file prior to visit.     Allergies  Allergen Reactions  . Ace Inhibitors Cough  . Codeine Phosphate Nausea Only  . Hydrochlorothiazide W-Triamterene Other (See Comments)    CRAMPING    Family History  Problem Relation Age of Onset  . Alzheimer's disease Mother   . Heart failure Father   . CVA Father   . Coronary artery disease Brother        cabg  . Alzheimer's disease Sister   . Mental illness Sister   . Alzheimer's disease Sister   . Other Sister        lung problems  . Diabetes Other        several siblings  . Prostate cancer Neg Hx   . Colon cancer Neg Hx   . Esophageal cancer Neg Hx   . Stomach cancer Neg Hx   . Rectal cancer Neg Hx     BP (!) 152/82 (BP Location: Left Arm, Patient Position: Sitting, Cuff Size: Normal)   Pulse 89   Ht _0  (1.702 m)   Wt 161 lb 9.6 oz (73.3 kg)   SpO2 93%   BMI 25.31 kg/m   Review of Systems He denies hypoglycemia.     Objective:    Physical Exam VITAL SIGNS:  See vs page GENERAL: no distress Pulses: foot pulses are intact bilaterally.   MSK: no deformity of the feet or ankles.  CV: no edema of the legs or ankles Skin:  no ulcer on the feet or ankles.  normal temp on the feet and ankles.  Old healed surgical scar (vein harvest) at the right leg.  Neuro: sensation is intact to touch on the feet and ankles.  Ext: long toenails are again noted, but less than at last ov.    Lab Results  Component Value Date   HGBA1C 8.2 (A) 09/16/2018      Assessment & Plan:  HTN: is noted today Insulin-requiring type 2 DM: this is the best control this pt should aim for, given lack of cbg info.  Frail elderly state: he is not a candidate for multiple daily injections.    Patient Instructions  check your blood sugar twice a day.  vary the time of day when you check, between before the 3 meals, and at bedtime.  also check if you have symptoms of your blood sugar being too high or too low.  please keep a record of the readings and bring it to your next appointment here (or you can bring the meter itself).  You can write it on any piece of paper.  please call us sooner if your blood sugar goes below 70, or if you have a  lot of readings over 200. Please continue the same medications.  Please come back for a follow-up appointment in 3-4 months.   When you see Dr Larose Kells next month, you should have your blood pressure rechecked.

## 2018-10-07 ENCOUNTER — Other Ambulatory Visit: Payer: Self-pay | Admitting: Internal Medicine

## 2018-10-08 ENCOUNTER — Encounter: Payer: Medicare HMO | Admitting: *Deleted

## 2018-10-09 ENCOUNTER — Telehealth: Payer: Self-pay

## 2018-10-09 NOTE — Telephone Encounter (Signed)
Left message for patient to remind of missed remote transmission.  

## 2018-10-10 IMAGING — CR DG CHEST 2V
2 series · 2 of 2 positions shown · non-contrast
Comparison: Chest radiograph dated 11/29/2016

CLINICAL DATA: 89-year-old male with chest pain.

EXAM:
CHEST  2 VIEW

[chest pa]
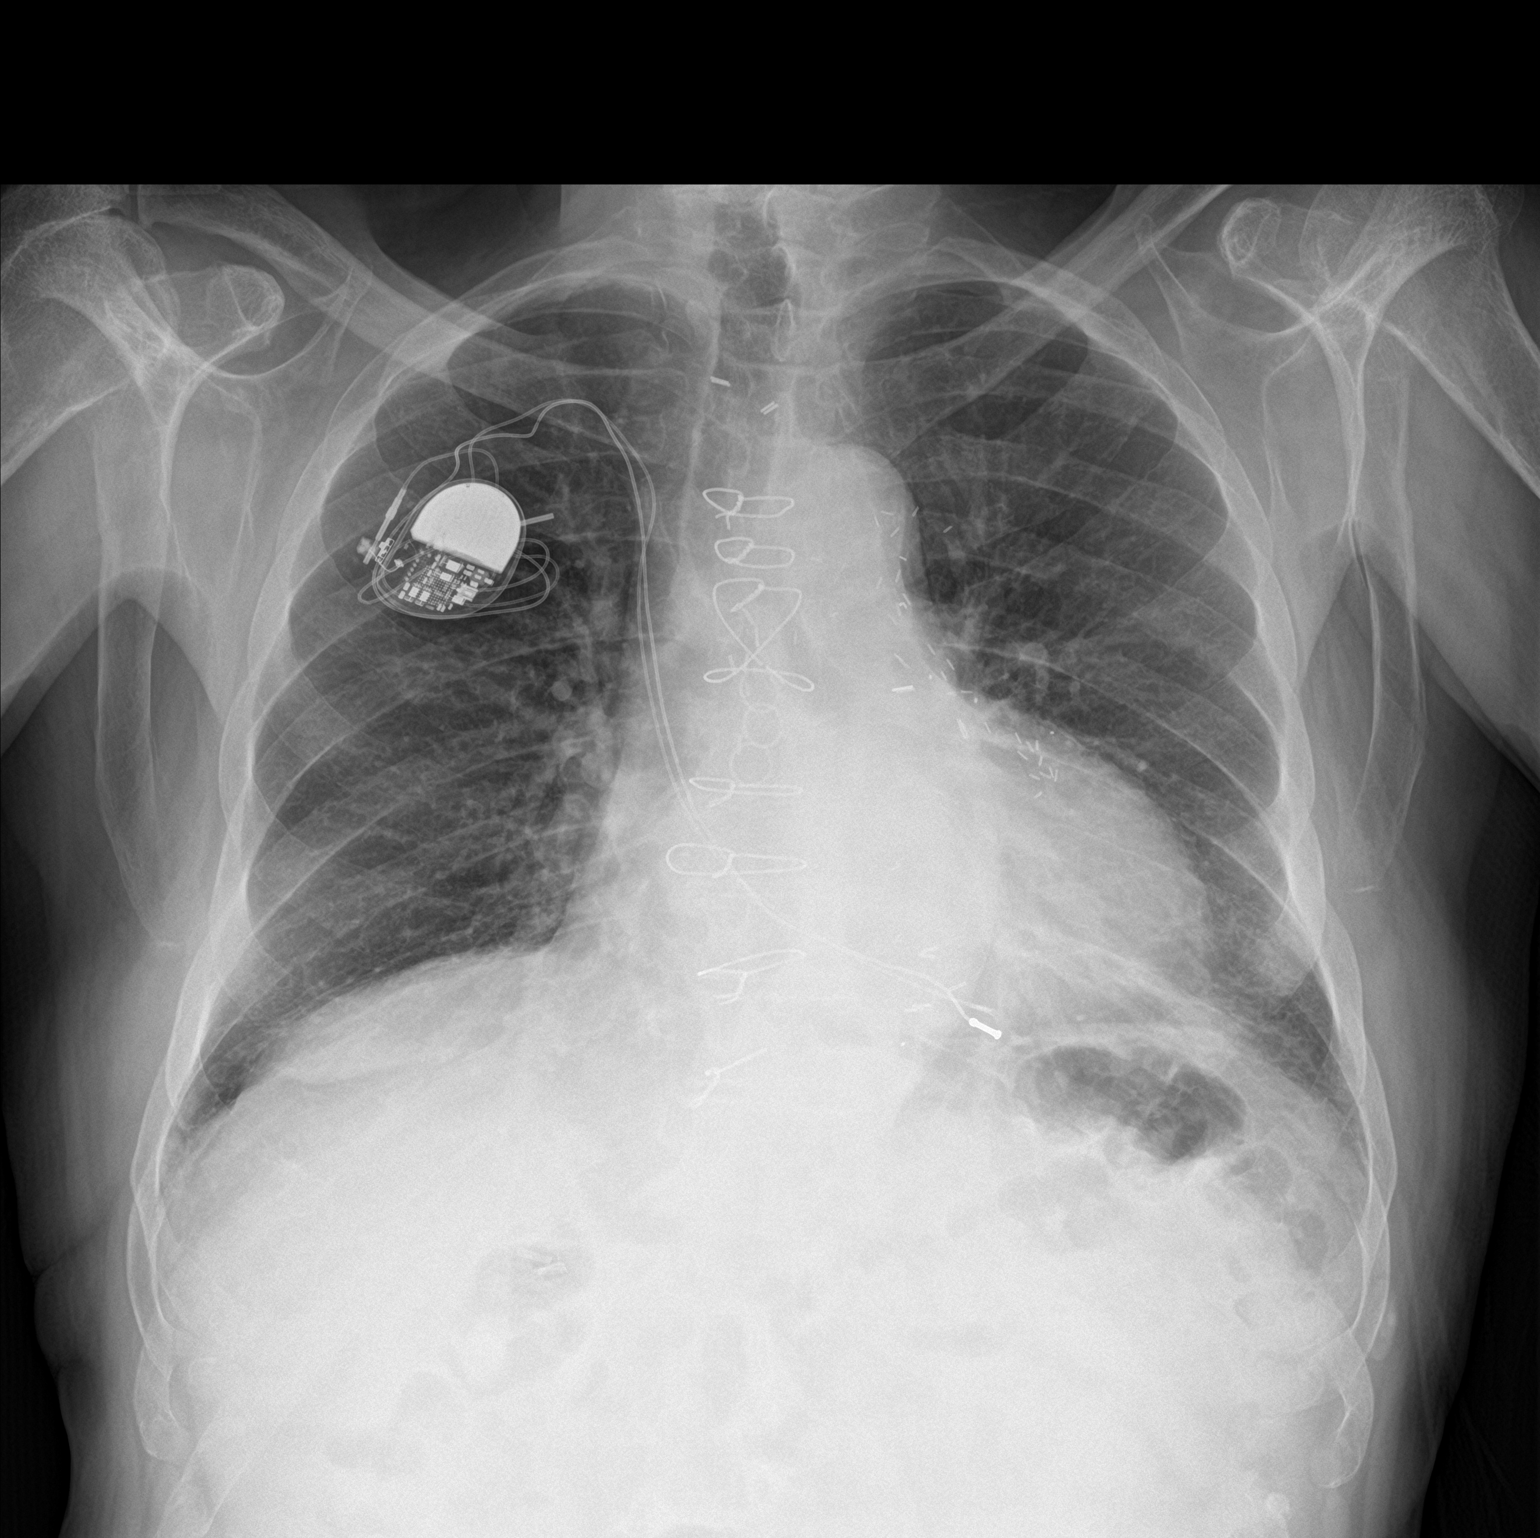

[chest lat]
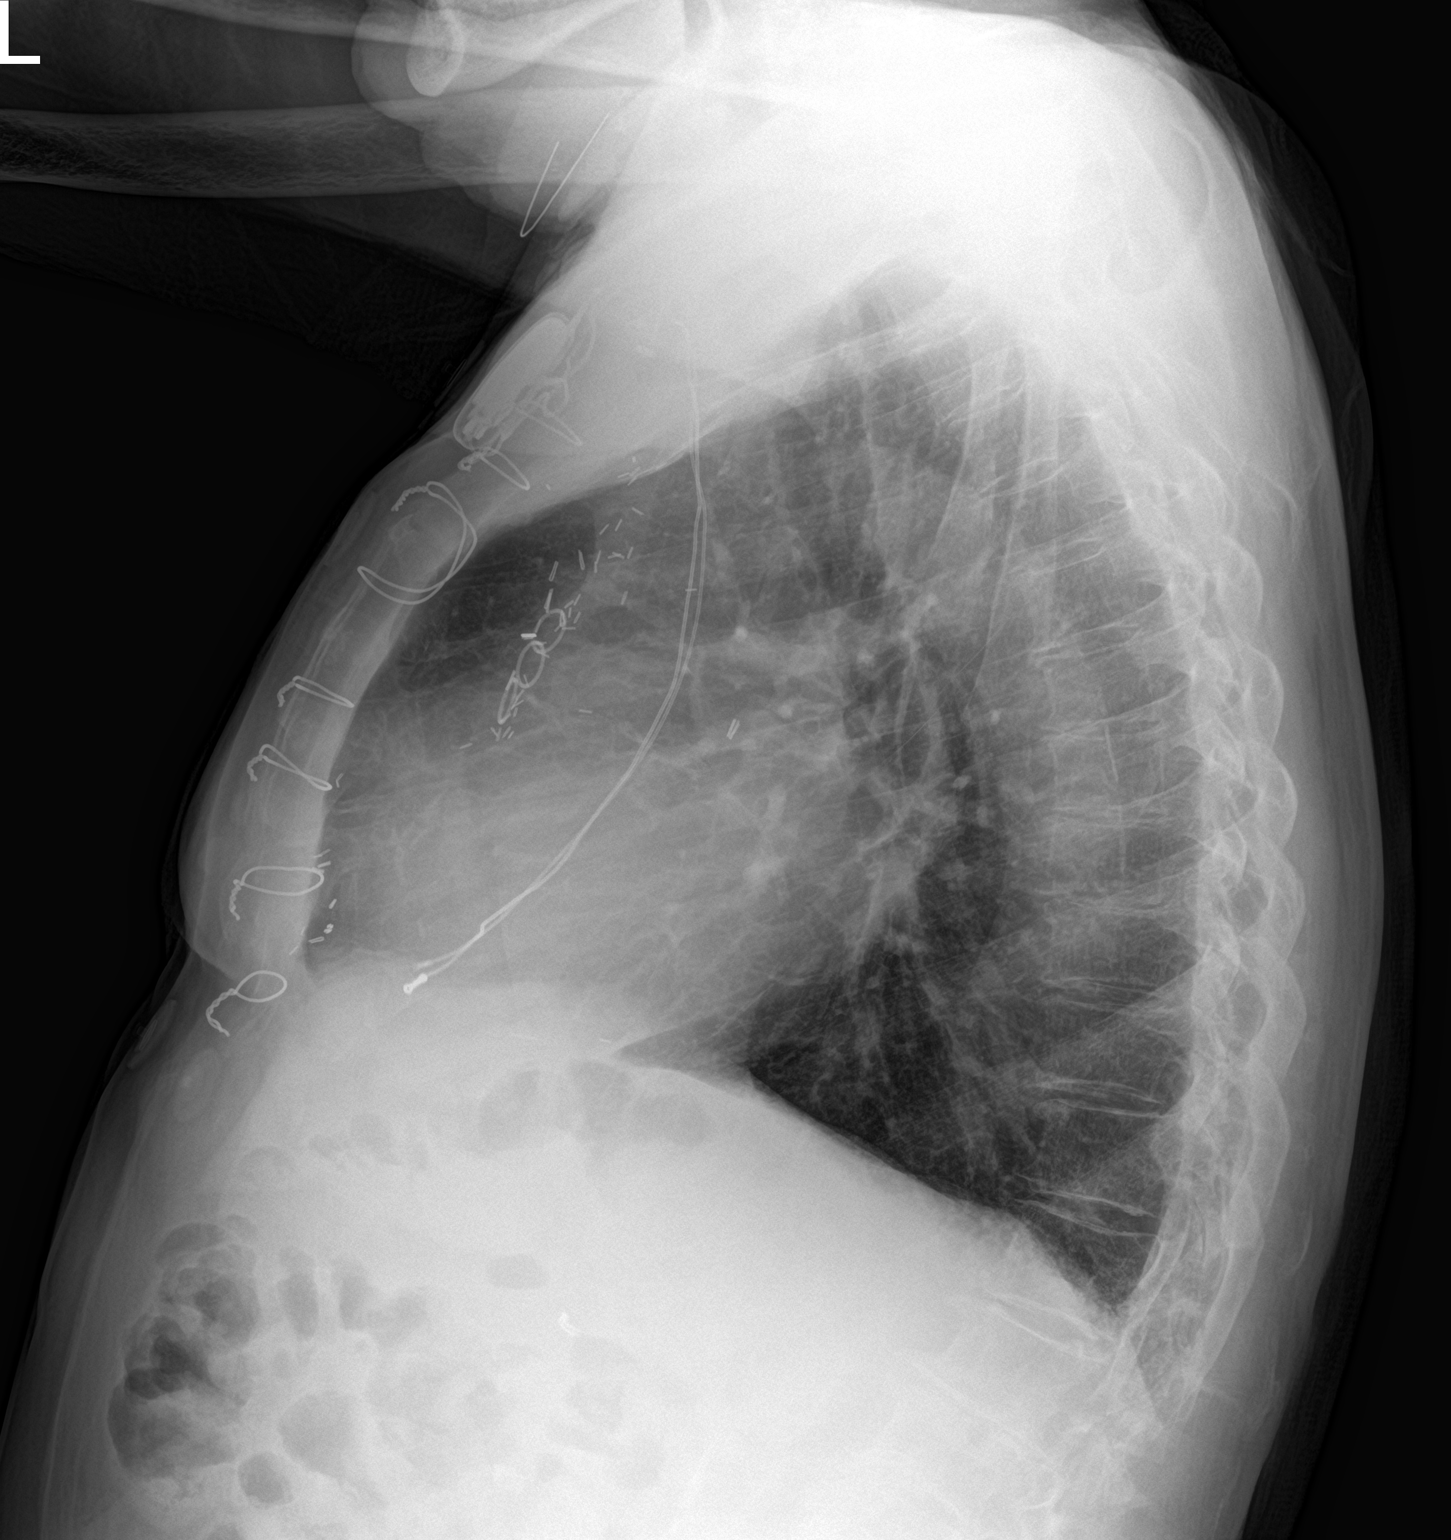

[2 of 2 positions shown; findings below may reference images not displayed]

FINDINGS: There is shallow inspiration with bibasilar subsegmental
atelectasis. Mild interstitial coarsening and chronic bronchitic
changes. No focal consolidation, pleural effusion, or pneumothorax.
Stable cardiac silhouette. The thoracic aorta is tortuous. Median
sternotomy wires and CABG vascular clips. Right pectoral pacemaker
device. No acute osseous pathology.
IMPRESSION: No active cardiopulmonary disease.

## 2018-10-13 ENCOUNTER — Ambulatory Visit: Payer: Medicare HMO | Admitting: Internal Medicine

## 2018-10-14 ENCOUNTER — Encounter: Payer: Self-pay | Admitting: Internal Medicine

## 2018-10-14 ENCOUNTER — Ambulatory Visit (INDEPENDENT_AMBULATORY_CARE_PROVIDER_SITE_OTHER): Payer: Medicare HMO | Admitting: Internal Medicine

## 2018-10-14 DIAGNOSIS — I4821 Permanent atrial fibrillation: Secondary | ICD-10-CM

## 2018-10-14 DIAGNOSIS — E118 Type 2 diabetes mellitus with unspecified complications: Secondary | ICD-10-CM | POA: Diagnosis not present

## 2018-10-14 DIAGNOSIS — N189 Chronic kidney disease, unspecified: Secondary | ICD-10-CM

## 2018-10-14 DIAGNOSIS — E785 Hyperlipidemia, unspecified: Secondary | ICD-10-CM

## 2018-10-14 DIAGNOSIS — I10 Essential (primary) hypertension: Secondary | ICD-10-CM | POA: Diagnosis not present

## 2018-10-14 DIAGNOSIS — E039 Hypothyroidism, unspecified: Secondary | ICD-10-CM

## 2018-10-14 DIAGNOSIS — I714 Abdominal aortic aneurysm, without rupture: Secondary | ICD-10-CM | POA: Diagnosis not present

## 2018-10-14 NOTE — Progress Notes (Signed)
Subjective:    Patient ID: [name], male    DOB: Dec 25, 1925, 85 y.o.   MRN: [id number]  DOS:  10/14/2018 Type of visit - description: Virtual Visit via Video Note  I connected with@ on 10/15/18 at  3:00 PM EDT by a video enabled telemedicine application and verified that I am speaking with the correct person using two identifiers.   THIS ENCOUNTER IS A VIRTUAL VISIT DUE TO COVID-19 - PATIENT WAS NOT SEEN IN THE OFFICE. PATIENT HAS CONSENTED TO VIRTUAL VISIT / TELEMEDICINE VISIT   Location of patient: home  Location of provider: office  I discussed the limitations of evaluation and management by telemedicine and the availability of in person appointments. The patient expressed understanding and agreed to proceed.  History of Present Illness: Routine office visit In general the patient feels well. Has no major concerns, we reviewed his medications, reports good compliance with all of them CAD: Seen by cardiology, note reviewed. HTN: Good compliance with medications, no ambulatory BPs CRI: Due for labs High cholesterol: Good med compliance without apparent side effects    BP Readings from Last 3 Encounters:  09/16/18 (!) 152/82  07/01/18 (!) 156/78  05/18/18 128/72      Review of Systems Denies fever chills Following all the COVID-19 precautions Denies chest pain or difficulty breathing.  No edema No nausea, vomiting, diarrhea No cough  Past Medical History:  Diagnosis Date   AAA (abdominal aortic aneurysm) ([place])    Anemia due to chronic blood loss 11/25/2008   Recurrent over the years EGD and colonoscopy x 2 each 2004 and 2010 without cause     Atrial fibrillation (HCC)    BPH (benign prostatic hyperplasia)    reports aprocedure (TURP) remotely in [place] KitchenNo futher f/u w/ urology   CAD (coronary artery disease)    Diabetes mellitus, type 2 ([place])    Diverticulosis    left colon   Erosive gastritis    Fall 08/08/2016   OUTSIDE AT HOME FACIAL TRAUMA     GERD (gastroesophageal reflux disease)    Hyperlipidemia    Hypertension    Hypothyroidism    Insomnia    transient   Osteopenia    dexa 2-11   Vitamin B12 deficiency     Past Surgical History:  Procedure Laterality Date   CARDIAC CATHETERIZATION  01/31/06, 09/25/10, 09-2011   CATARACT EXTRACTION     right   CHOLECYSTECTOMY, LAPAROSCOPIC     COLONOSCOPY     multiple   CORONARY ARTERY BYPASS GRAFT     1999 stents in 2000   ESOPHAGOGASTRODUODENOSCOPY     multiple   GIVENS CAPSULE STUDY N/A 11/04/2012   Procedure: GIVENS CAPSULE STUDY;  Surgeon: Gatha Mayer, MD;  Location: [facility] ENDOSCOPY;  Service: Endoscopy;  Laterality: N/A;   inguinal herniorrhaphies     bilateral   LEFT HEART CATHETERIZATION WITH CORONARY ANGIOGRAM N/A 10/04/2011   Procedure: LEFT HEART CATHETERIZATION WITH CORONARY ANGIOGRAM;  Surgeon: Burnell Blanks, MD;  Location: [facility] CATH LAB;  Service: Cardiovascular;  Laterality: N/A;   pacemaker  1980, 1995, 2013   medtronic minix 8341   PENILE PROSTHESIS IMPLANT  1992   PERMANENT PACEMAKER GENERATOR CHANGE N/A 06/05/2011   Procedure: PERMANENT PACEMAKER GENERATOR CHANGE;  Surgeon: Evans Lance, MD;  Location: [facility] CATH LAB;  Service: Cardiovascular;  Laterality: N/A;   PROSTATECTOMY     transurethral   right hip replacement     TRANSTHORACIC ECHOCARDIOGRAM  12/2006    Social  History   Socioeconomic History   Marital status: Legally Separated    Spouse name: Not on file   Number of children: 5   Years of education: Not on file   Highest education level: Not on file  Occupational History   Occupation: retired    Fish farm manager: RETIRED  Scientist, product/process development strain: Not on file   Food insecurity:    Worry: Not on file    Inability: Not on file   Transportation needs:    Medical: Not on file    Non-medical: Not on file  Tobacco Use   Smoking status: Never Smoker   Smokeless tobacco: Never Used  Substance and  Sexual Activity   Alcohol use: Yes    Alcohol/week: 1.0 - 2.0 standard drinks    Types: 1 - 2 Glasses of wine per week    Comment: socially    Drug use: No   Sexual activity: Not Currently  Lifestyle   Physical activity:    Days per week: Not on file    Minutes per session: Not on file   Stress: Not on file  Relationships   Social connections:    Talks on phone: Not on file    Gets together: Not on file    Attends religious service: Not on file    Active member of club or organization: Not on file    Attends meetings of clubs or organizations: Not on file    Relationship status: Not on file   Intimate partner violence:    Fear of current or ex partner: Not on file    Emotionally abused: Not on file    Physically abused: Not on file    Forced sexual activity: Not on file  Other Topics Concern   Not on file  Social History Narrative   Lives by self, independent on ADL, retired. Separated from wife.    Daughters:   [name] ( lives in [place]) [phone]   [name] ( lives out of town)  [phone]            Allergies as of 10/14/2018      Reactions   Ace Inhibitors Cough   Codeine Phosphate Nausea Only   Hydrochlorothiazide W-triamterene Other (See Comments)   CRAMPING      Medication List       Accurate as of October 14, 2018 11:59 PM. If you have any questions, ask your nurse or doctor.        Accu-Chek FastClix Lancets Misc Used to check blood sugars 2x daily.   Accu-Chek Guide w/Device Kit 1 Device by Does not apply route daily.   acetaminophen 325 MG tablet Commonly known as:  TYLENOL Take 650 mg by mouth every 6 (six) hours as needed for mild pain or moderate pain.   apixaban 5 MG Tabs tablet Commonly known as:  Eliquis Take 1 tablet (5 mg total) by mouth 2 (two) times daily.   azelastine 0.1 % nasal spray Commonly known as:  ASTELIN Place 2 sprays into both nostrils at bedtime as needed for rhinitis or allergies. Use in each nostril as  directed   digoxin 0.125 MG tablet Commonly known as:  LANOXIN Take 1 tablet (0.125 mg total) by mouth every other day.   dorzolamide-timolol 22.3-6.8 MG/ML ophthalmic solution Commonly known as:  COSOPT Place 1 drop into both eyes 2 (two) times daily.   escitalopram 10 MG tablet Commonly known as:  LEXAPRO Take 1 tablet (10 mg total)  by mouth daily.   fenofibrate micronized 134 MG capsule Commonly known as:  LOFIBRA Take 1 capsule (134 mg total) by mouth daily.   glucose blood test strip Commonly known as:  Accu-Chek Guide Used to check blood sugars 2x daily.   Insulin Glargine 100 UNIT/ML Solostar Pen Commonly known as:  Lantus SoloStar Inject 38 Units into the skin every morning.   Insulin Pen Needle 31G X 6 MM Misc To use w/ Lantus   latanoprost 0.005 % ophthalmic solution Commonly known as:  XALATAN Place 1 drop into both eyes at bedtime.   levothyroxine 100 MCG tablet Commonly known as:  Synthroid Take 1 tablet (100 mcg total) by mouth daily before breakfast.   metoprolol tartrate 25 MG tablet Commonly known as:  LOPRESSOR Take 1 tablet (25 mg total) by mouth 2 (two) times daily.   mirtazapine 15 MG tablet Commonly known as:  REMERON Take 1 tablet (15 mg total) by mouth at bedtime.   nitroGLYCERIN 0.4 MG SL tablet Commonly known as:  NITROSTAT Place 1 tablet (0.4 mg total) under the tongue every 5 (five) minutes x 3 doses as needed for chest pain. Then contact 911 or go to ER   omeprazole 40 MG capsule Commonly known as:  PRILOSEC Take 1 capsule (40 mg total) by mouth daily.   pantoprazole 40 MG tablet Commonly known as:  PROTONIX Take 1 tablet (40 mg total) by mouth daily before breakfast.   pravastatin 40 MG tablet Commonly known as:  PRAVACHOL Take 2 tablets (80 mg total) by mouth daily.   sitaGLIPtin 100 MG tablet Commonly known as:  Januvia Take 1 tablet (100 mg total) by mouth daily.   triamcinolone ointment 0.1 % Commonly known as:   KENALOG   vitamin B-12 1000 MCG tablet Commonly known as:  CYANOCOBALAMIN Take 1 tablet (1,000 mcg total) by mouth daily.           Objective:   Physical Exam There were no vitals taken for this visit. This is a virtual visit, the patient is alert oriented x3, no apparent distress.  His daughter is with him.    Assessment     Assessment DM, + neuropahty per exam 12-2015 CRI Creat ~ 1.3-1.4 HTN Hyperlipidemia Hypothyroidism Anxiety and depression CV: --CAD -- Dr Harrington Challenger --Atrial fibrillation, complete heart block, PPM --- Dr Lovena Le --AAA: [place] 06-2014 stable, was rec no further imagine  GI: Erosive gastritis, GERD, h/o anemia d/t  chronic blood loss BPH NEURO:  07-2016: Fall, vaso vagal syncope? : SAH, eld asa-warfarin Osteopenia- declined dexa 2015 B 12 deficiency Skin cancer, sees derm  Plan: DM: Per endocrinology Chronic renal insufficiency: Check a CMP [id number] on metoprolol, no ambulatory BPs, BPs at the office slightly elevated, will do a nurse visit at the time of labs and check his blood pressure here. Hyperlipidemia: Currently on fenofibrate and Pravachol, check a FLP. Hypothyroidism: Reports good compliance with meds, check a TSH Anxiety and depression: On Lexapro, reportedly well controlled CAD, atrial fibrillation: Seen by cardiology 06-2018.  Felt to be stable.  Anticoagulated, rate controlled, check a CBC Med compliance: Reports is taking "all medications", could not give me more details Plan: Nurse visit for labs and BP check RTC 4 months  Time spent more than 25 minutes   I discussed the assessment and treatment plan with the patient. The patient was provided an opportunity to ask questions and all were answered. The patient agreed with the plan and demonstrated an understanding of the instructions.  The patient was advised to call back or seek an in-person evaluation if the symptoms worsen or if the condition fails to improve as anticipated.

## 2018-10-15 ENCOUNTER — Encounter: Payer: Self-pay | Admitting: Cardiology

## 2018-10-15 ENCOUNTER — Other Ambulatory Visit: Payer: Self-pay

## 2018-10-15 DIAGNOSIS — N189 Chronic kidney disease, unspecified: Secondary | ICD-10-CM

## 2018-10-15 DIAGNOSIS — I4891 Unspecified atrial fibrillation: Secondary | ICD-10-CM

## 2018-10-15 DIAGNOSIS — E039 Hypothyroidism, unspecified: Secondary | ICD-10-CM

## 2018-10-15 DIAGNOSIS — E785 Hyperlipidemia, unspecified: Secondary | ICD-10-CM

## 2018-10-15 DIAGNOSIS — I4821 Permanent atrial fibrillation: Secondary | ICD-10-CM

## 2018-10-15 NOTE — Assessment & Plan Note (Signed)
DM: Per endocrinology Chronic renal insufficiency: Check a CMP [id number] on metoprolol, no ambulatory BPs, BPs at the office slightly elevated, will do a nurse visit at the time of labs and check his blood pressure here. Hyperlipidemia: Currently on fenofibrate and Pravachol, check a FLP. Hypothyroidism: Reports good compliance with meds, check a TSH Anxiety and depression: On Lexapro, reportedly well controlled CAD, atrial fibrillation: Seen by cardiology 06-2018.  Felt to be stable.  Anticoagulated, rate controlled, check a CBC Med compliance: Reports is taking "all medications", could not give me more details Plan: Nurse visit for labs and BP check RTC 4 months

## 2018-10-16 ENCOUNTER — Telehealth: Payer: Self-pay | Admitting: Internal Medicine

## 2018-10-16 NOTE — Telephone Encounter (Signed)
-----   Message from Colon Branch, MD sent at 10/14/2018  4:53 PM EDT ----- Regarding: Labs and  nurse visit Heather:  Enter future labs CMP: Chronic renal insufficiency FLP: Hyperlipidemia TSH: Hypothyroidism CBC: Atrial fibrillation  Sherri: Schedule a nurse visit for BP check and labs. Next visit face-to-face 4 months

## 2018-10-16 NOTE — Telephone Encounter (Signed)
Called pt left msg to call back face to face in 4 months Nurse for BP and labs

## 2018-10-23 ENCOUNTER — Ambulatory Visit (INDEPENDENT_AMBULATORY_CARE_PROVIDER_SITE_OTHER): Payer: Medicare HMO | Admitting: *Deleted

## 2018-10-23 DIAGNOSIS — I442 Atrioventricular block, complete: Secondary | ICD-10-CM

## 2018-10-23 LAB — CUP PACEART REMOTE DEVICE CHECK
Battery Impedance: 4503 Ohm
Battery Remaining Longevity: 11 mo
Battery Voltage: 2.68 V
Brady Statistic RV Percent Paced: 89 %
Date Time Interrogation Session: 20200612130646
Implantable Lead Implant Date: 19950413
Implantable Lead Location: 753860
Implantable Pulse Generator Implant Date: 20130123
Lead Channel Impedance Value: 0 Ohm
Lead Channel Impedance Value: 523 Ohm
Lead Channel Pacing Threshold Amplitude: 0.875 V
Lead Channel Pacing Threshold Pulse Width: 0.4 ms
Lead Channel Setting Pacing Amplitude: 2.5 V
Lead Channel Setting Pacing Pulse Width: 0.4 ms
Lead Channel Setting Sensing Sensitivity: 4 mV

## 2018-10-26 LAB — CUP PACEART REMOTE DEVICE CHECK
Date Time Interrogation Session: 20200615090841
Implantable Lead Implant Date: 19950413
Implantable Lead Location: 753860
Implantable Pulse Generator Implant Date: 20130123

## 2018-10-27 ENCOUNTER — Encounter: Payer: Self-pay | Admitting: Cardiology

## 2018-10-27 NOTE — Progress Notes (Signed)
Remote pacemaker transmission.   

## 2018-11-18 ENCOUNTER — Telehealth: Payer: Self-pay

## 2018-11-18 MED ORDER — AZELASTINE HCL 0.1 % NA SOLN: 2.0000 | Freq: Every evening | NASAL | 12 refills | Status: AC | PRN

## 2018-11-18 NOTE — Telephone Encounter (Signed)
Astelin refilled 

## 2018-11-18 NOTE — Telephone Encounter (Signed)
Copied from [place] [phone]. Topic: General - Other >> Nov 18, 2018 12:58 PM Wynetta Emery, [place] C wrote: Reason for CRM: pt is requesting a refill on nasal medication. Pt/daughter is unsure of the name of the medication.   Pharmacy: [place], [place] - [place] [phone] (Phone) [phone] (Fax)

## 2018-11-25 ENCOUNTER — Telehealth: Payer: Self-pay | Admitting: Internal Medicine

## 2018-11-25 NOTE — Telephone Encounter (Signed)
New message:    Patient calling to get a appt with Dr. Lovena Le. Please call patient.

## 2018-12-04 ENCOUNTER — Other Ambulatory Visit: Payer: Self-pay

## 2018-12-04 ENCOUNTER — Encounter: Payer: Self-pay | Admitting: Internal Medicine

## 2018-12-04 ENCOUNTER — Ambulatory Visit (INDEPENDENT_AMBULATORY_CARE_PROVIDER_SITE_OTHER): Payer: Medicare HMO | Admitting: Internal Medicine

## 2018-12-04 DIAGNOSIS — J300 Vasomotor rhinitis: Secondary | ICD-10-CM

## 2018-12-04 MED ORDER — IPRATROPIUM BROMIDE 0.06 % NA SOLN: 2.0000 | Freq: Three times a day (TID) | NASAL | 6 refills | Status: DC

## 2018-12-04 NOTE — Progress Notes (Signed)
Subjective:    Patient ID: [name], male    DOB: 1926-05-11, 85 y.o.   MRN: [id number]  DOS:  12/04/2018 Type of visit - description: Attempted  to make this a video visit, due to technical difficulties from the patient side it was not possible  thus we proceeded with a Virtual Visit via Telephone    I connected with@   by telephone and verified that I am speaking with the correct person using two identifiers.  THIS ENCOUNTER IS A VIRTUAL VISIT DUE TO COVID-19 - PATIENT WAS NOT SEEN IN THE OFFICE. PATIENT HAS CONSENTED TO VIRTUAL VISIT / TELEMEDICINE VISIT   Location of patient: home  Location of provider: office  I discussed the limitations, risks, security and privacy concerns of performing an evaluation and management service by telephone and the availability of in person appointments. I also discussed with the patient that there may be a patient responsible charge related to this service. The patient expressed understanding and agreed to proceed.   History of Present Illness: Acute: 1 year history of clear nasal discharge triggered by eating. Occasionally, white mucus accumulating his throat while eating.   Review of Systems Denies fever chills No sneezing No itchy eyes or nose No chest pain, difficulty breathing or cough  Past Medical History:  Diagnosis Date  . AAA (abdominal aortic aneurysm) ([place])   . Anemia due to chronic blood loss 11/25/2008   Recurrent over the years EGD and colonoscopy x 2 each 2004 and 2010 without cause    . Atrial fibrillation ([place])   . BPH (benign prostatic hyperplasia)    reports aprocedure (TURP) remotely in [place].Mar[place] KitchenNo futher f/u w/ urology  . CAD (coronary artery disease)   . Diabetes mellitus, type 2 ([place])   . Diverticulosis    left colon  . Erosive gastritis   . Fall 08/08/2016   OUTSIDE AT HOME FACIAL TRAUMA   . GERD (gastroesophageal reflux disease)   . Hyperlipidemia   . Hypertension   . Hypothyroidism   . Insomnia    transient  . Osteopenia    dexa 2-11  . Vitamin B12 deficiency     Past Surgical History:  Procedure Laterality Date  . CARDIAC CATHETERIZATION  01/31/06, 09/25/10, 09-2011  . CATARACT EXTRACTION     right  . CHOLECYSTECTOMY, LAPAROSCOPIC    . COLONOSCOPY     multiple  . CORONARY ARTERY BYPASS GRAFT     1999 stents in 2000  . ESOPHAGOGASTRODUODENOSCOPY     multiple  . GIVENS CAPSULE STUDY N/A 11/04/2012   Procedure: GIVENS CAPSULE STUDY;  Surgeon: Gatha Mayer, MD;  Location: [facility] ENDOSCOPY;  Service: Endoscopy;  Laterality: N/A;  . inguinal herniorrhaphies     bilateral  . LEFT HEART CATHETERIZATION WITH CORONARY ANGIOGRAM N/A 10/04/2011   Procedure: LEFT HEART CATHETERIZATION WITH CORONARY ANGIOGRAM;  Surgeon: Burnell Blanks, MD;  Location: [facility] CATH LAB;  Service: Cardiovascular;  Laterality: N/A;  . pacemaker  [employer], 2013   medtronic minix 8341  . PENILE PROSTHESIS IMPLANT  1992  . PERMANENT PACEMAKER GENERATOR CHANGE N/A 06/05/2011   Procedure: PERMANENT PACEMAKER GENERATOR CHANGE;  Surgeon: Evans Lance, MD;  Location: [facility] CATH LAB;  Service: Cardiovascular;  Laterality: N/A;  . PROSTATECTOMY     transurethral  . right hip replacement    . TRANSTHORACIC ECHOCARDIOGRAM  12/2006    Social History   Socioeconomic History  . Marital status: Legally Separated    Spouse name: Not on  file  . Number of children: 5  . Years of education: Not on file  . Highest education level: Not on file  Occupational History  . Occupation: retired    Fish farm manager: RETIRED  Social Needs  . Financial resource strain: Not on file  . Food insecurity    Worry: Not on file    Inability: Not on file  . Transportation needs    Medical: Not on file    Non-medical: Not on file  Tobacco Use  . Smoking status: Never Smoker  . Smokeless tobacco: Never Used  Substance and Sexual Activity  . Alcohol use: Yes    Alcohol/week: 1.0 - 2.0 standard drinks    Types: 1 - 2 Glasses of wine per  week    Comment: socially   . Drug use: No  . Sexual activity: Not Currently  Lifestyle  . Physical activity    Days per week: Not on file    Minutes per session: Not on file  . Stress: Not on file  Relationships  . Social Herbalist on phone: Not on file    Gets together: Not on file    Attends religious service: Not on file    Active member of club or organization: Not on file    Attends meetings of clubs or organizations: Not on file    Relationship status: Not on file  . Intimate partner violence    Fear of current or ex partner: Not on file    Emotionally abused: Not on file    Physically abused: Not on file    Forced sexual activity: Not on file  Other Topics Concern  . Not on file  Social History Narrative   Lives by self, independent on ADL, retired. Separated from wife.    Daughters:   [name] ( lives in [place]) [phone]   [name] ( lives out of town)  [phone]            Allergies as of 12/04/2018      Reactions   Ace Inhibitors Cough   Codeine Phosphate Nausea Only   Hydrochlorothiazide W-triamterene Other (See Comments)   CRAMPING      Medication List       Accurate as of December 04, 2018 11:59 PM. If you have any questions, ask your nurse or doctor.        Accu-Chek FastClix Lancets Misc Used to check blood sugars 2x daily.   Accu-Chek Guide w/Device Kit 1 Device by Does not apply route daily.   acetaminophen 325 MG tablet Commonly known as: TYLENOL Take 650 mg by mouth every 6 (six) hours as needed for mild pain or moderate pain.   apixaban 5 MG Tabs tablet Commonly known as: Eliquis Take 1 tablet (5 mg total) by mouth 2 (two) times daily.   azelastine 0.1 % nasal spray Commonly known as: ASTELIN Place 2 sprays into both nostrils at bedtime as needed for rhinitis or allergies. Use in each nostril as directed   digoxin 0.125 MG tablet Commonly known as: LANOXIN Take 1 tablet (0.125 mg total) by mouth every other day.    dorzolamide-timolol 22.3-6.8 MG/ML ophthalmic solution Commonly known as: COSOPT Place 1 drop into both eyes 2 (two) times daily.   escitalopram 10 MG tablet Commonly known as: LEXAPRO Take 1 tablet (10 mg total) by mouth daily.   fenofibrate micronized 134 MG capsule Commonly known as: LOFIBRA Take 1 capsule (134 mg total) by mouth daily.  glucose blood test strip Commonly known as: Accu-Chek Guide Used to check blood sugars 2x daily.   Insulin Glargine 100 UNIT/ML Solostar Pen Commonly known as: Lantus SoloStar Inject 38 Units into the skin every morning.   Insulin Pen Needle 31G X 6 MM Misc To use w/ Lantus   ipratropium 0.06 % nasal spray Commonly known as: ATROVENT Place 2 sprays into both nostrils 3 (three) times daily. Started by: Kathlene November, MD   latanoprost 0.005 % ophthalmic solution Commonly known as: XALATAN Place 1 drop into both eyes at bedtime.   levothyroxine 100 MCG tablet Commonly known as: Synthroid Take 1 tablet (100 mcg total) by mouth daily before breakfast.   metoprolol tartrate 25 MG tablet Commonly known as: LOPRESSOR Take 1 tablet (25 mg total) by mouth 2 (two) times daily.   mirtazapine 15 MG tablet Commonly known as: REMERON Take 1 tablet (15 mg total) by mouth at bedtime.   nitroGLYCERIN 0.4 MG SL tablet Commonly known as: NITROSTAT Place 1 tablet (0.4 mg total) under the tongue every 5 (five) minutes x 3 doses as needed for chest pain. Then contact 911 or go to ER   omeprazole 40 MG capsule Commonly known as: PRILOSEC Take 1 capsule (40 mg total) by mouth daily.   pantoprazole 40 MG tablet Commonly known as: PROTONIX Take 1 tablet (40 mg total) by mouth daily before breakfast.   pravastatin 40 MG tablet Commonly known as: PRAVACHOL Take 2 tablets (80 mg total) by mouth daily.   sitaGLIPtin 100 MG tablet Commonly known as: Januvia Take 1 tablet (100 mg total) by mouth daily.   triamcinolone ointment 0.1 % Commonly known as:  KENALOG   vitamin B-12 1000 MCG tablet Commonly known as: CYANOCOBALAMIN Take 1 tablet (1,000 mcg total) by mouth daily.           Objective:   Physical Exam There were no vitals taken for this visit. This is a virtual phone visit.  He sounds well.  Alert oriented x3      Assessment     Assessment DM, + neuropahty per exam 12-2015 CRI Creat ~ 1.3-1.4 HTN Hyperlipidemia Hypothyroidism Anxiety and depression CV: --CAD -- Dr Harrington Challenger --Atrial fibrillation, complete heart block, PPM --- Dr Lovena Le --AAA: [place] 06-2014 stable, was rec no further imagine  GI: Erosive gastritis, GERD, h/o anemia d/t  chronic blood loss BPH NEURO:  07-2016: Fall, vaso vagal syncope? 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  Apparently that did not help much.  Will try ipratropium 3 times a day. He will let me know if that is not effective.   I discussed the assessment and treatment plan with the patient. The patient was provided an opportunity to ask questions and all were answered. The patient agreed with the plan and demonstrated an understanding of the instructions.   The patient was advised to call back or seek an in-person evaluation if the symptoms worsen or if the condition fails to improve as anticipated.  I provided 12 minutes of non-face-to-face time during this encounter.  Kathlene November, MD

## 2018-12-06 NOTE — Assessment & Plan Note (Signed)
Vasomotor rhinitis: Symptoms consistent with vasomotor rhinitis, on chart review he receive Astelin few years ago.  Apparently that did not help much.  Will try ipratropium 3 times a day. He will let me know if that is not effective.

## 2018-12-30 ENCOUNTER — Ambulatory Visit: Payer: Medicare HMO | Admitting: Endocrinology

## 2018-12-30 ENCOUNTER — Emergency Department (HOSPITAL_COMMUNITY)
Admission: EM | Admit: 2018-12-30 | Discharge: 2018-12-31 | Disposition: A | Discharge status: Home / Self Care | Payer: Medicare HMO | Attending: Emergency Medicine | Admitting: Emergency Medicine

## 2018-12-30 ENCOUNTER — Encounter (HOSPITAL_COMMUNITY): Payer: Self-pay | Admitting: Emergency Medicine

## 2018-12-30 ENCOUNTER — Emergency Department (HOSPITAL_COMMUNITY): Payer: Medicare HMO

## 2018-12-30 DIAGNOSIS — W1830XA Fall on same level, unspecified, initial encounter: Secondary | ICD-10-CM | POA: Insufficient documentation

## 2018-12-30 DIAGNOSIS — R102 Pelvic and perineal pain: Secondary | ICD-10-CM | POA: Diagnosis not present

## 2018-12-30 DIAGNOSIS — E119 Type 2 diabetes mellitus without complications: Secondary | ICD-10-CM | POA: Insufficient documentation

## 2018-12-30 DIAGNOSIS — I10 Essential (primary) hypertension: Secondary | ICD-10-CM | POA: Insufficient documentation

## 2018-12-30 DIAGNOSIS — Y999 Unspecified external cause status: Secondary | ICD-10-CM | POA: Insufficient documentation

## 2018-12-30 DIAGNOSIS — I2510 Atherosclerotic heart disease of native coronary artery without angina pectoris: Secondary | ICD-10-CM | POA: Insufficient documentation

## 2018-12-30 DIAGNOSIS — Z950 Presence of cardiac pacemaker: Secondary | ICD-10-CM | POA: Diagnosis not present

## 2018-12-30 DIAGNOSIS — Z794 Long term (current) use of insulin: Secondary | ICD-10-CM | POA: Insufficient documentation

## 2018-12-30 DIAGNOSIS — S01112A Laceration without foreign body of left eyelid and periocular area, initial encounter: Secondary | ICD-10-CM

## 2018-12-30 DIAGNOSIS — S0081XA Abrasion of other part of head, initial encounter: Secondary | ICD-10-CM

## 2018-12-30 DIAGNOSIS — S3993XA Unspecified injury of pelvis, initial encounter: Secondary | ICD-10-CM | POA: Diagnosis not present

## 2018-12-30 DIAGNOSIS — S0181XA Laceration without foreign body of other part of head, initial encounter: Secondary | ICD-10-CM | POA: Diagnosis not present

## 2018-12-30 DIAGNOSIS — S0993XA Unspecified injury of face, initial encounter: Secondary | ICD-10-CM | POA: Diagnosis not present

## 2018-12-30 DIAGNOSIS — M11262 Other chondrocalcinosis, left knee: Secondary | ICD-10-CM | POA: Diagnosis not present

## 2018-12-30 DIAGNOSIS — Y929 Unspecified place or not applicable: Secondary | ICD-10-CM | POA: Diagnosis not present

## 2018-12-30 DIAGNOSIS — S8991XA Unspecified injury of right lower leg, initial encounter: Secondary | ICD-10-CM | POA: Diagnosis not present

## 2018-12-30 DIAGNOSIS — S0990XA Unspecified injury of head, initial encounter: Secondary | ICD-10-CM

## 2018-12-30 DIAGNOSIS — W19XXXA Unspecified fall, initial encounter: Secondary | ICD-10-CM

## 2018-12-30 DIAGNOSIS — S199XXA Unspecified injury of neck, initial encounter: Secondary | ICD-10-CM | POA: Diagnosis not present

## 2018-12-30 DIAGNOSIS — Y939 Activity, unspecified: Secondary | ICD-10-CM | POA: Insufficient documentation

## 2018-12-30 DIAGNOSIS — Z7901 Long term (current) use of anticoagulants: Secondary | ICD-10-CM | POA: Diagnosis not present

## 2018-12-30 DIAGNOSIS — M1712 Unilateral primary osteoarthritis, left knee: Secondary | ICD-10-CM | POA: Diagnosis not present

## 2018-12-30 DIAGNOSIS — R404 Transient alteration of awareness: Secondary | ICD-10-CM | POA: Diagnosis not present

## 2018-12-30 DIAGNOSIS — R0902 Hypoxemia: Secondary | ICD-10-CM | POA: Diagnosis not present

## 2018-12-30 DIAGNOSIS — M25762 Osteophyte, left knee: Secondary | ICD-10-CM | POA: Diagnosis not present

## 2018-12-30 DIAGNOSIS — R410 Disorientation, unspecified: Secondary | ICD-10-CM | POA: Diagnosis not present

## 2018-12-30 DIAGNOSIS — I7090 Unspecified atherosclerosis: Secondary | ICD-10-CM | POA: Diagnosis not present

## 2018-12-30 DIAGNOSIS — E039 Hypothyroidism, unspecified: Secondary | ICD-10-CM | POA: Diagnosis not present

## 2018-12-30 DIAGNOSIS — T07XXXA Unspecified multiple injuries, initial encounter: Secondary | ICD-10-CM | POA: Diagnosis present

## 2018-12-30 DIAGNOSIS — M25561 Pain in right knee: Secondary | ICD-10-CM | POA: Diagnosis not present

## 2018-12-30 DIAGNOSIS — I4891 Unspecified atrial fibrillation: Secondary | ICD-10-CM | POA: Insufficient documentation

## 2018-12-30 DIAGNOSIS — R9431 Abnormal electrocardiogram [ECG] [EKG]: Secondary | ICD-10-CM | POA: Diagnosis not present

## 2018-12-30 MED ORDER — FENTANYL CITRATE (PF) 100 MCG/2ML IJ SOLN
50.0000 ug | Freq: Once | INTRAMUSCULAR | Status: AC
  Administered 2018-12-30: 50 ug via INTRAVENOUS
  Filled 2018-12-30: qty 2

## 2018-12-30 NOTE — Progress Notes (Signed)
Consult request has been received. CSW attempting to follow up at present time  Luismanuel Corman M. Aeralyn Barna LCSWA Transitions of Care  Clinical Social Worker  Ph: [phone] 

## 2018-12-30 NOTE — ED Notes (Signed)
Patient transported to X-ray 

## 2018-12-30 NOTE — ED Triage Notes (Signed)
Pt arrives via EMS w/co fall getting out of the car & hit his left side of head on the cement. Abrasions to both  knees and palms. Pt takes Eliquis. Pt is confused to yr. & date @ baseline. C collar applied by EMS.

## 2018-12-30 NOTE — ED Provider Notes (Signed)
[facility] EMERGENCY DEPARTMENT Provider Note   CSN: [id number] Arrival date & time: 12/30/18  1812     History   Chief Complaint Chief Complaint  Patient presents with   Fall    HPI [name] is a 85 y.o. male with history of AAA, A. fib, CAD, diabetes mellitus, GERD, hypertension, hyperlipidemia, hypothyroidism, osteopenia presenting for evaluation of acute onset, persistent wound secondary to mechanical fall just prior to arrival.  He reports he was stepping out of a vehicle when he misstepped and landed on his left side, striking his head, both hands, and both knees.  He denies loss of consciousness.  He denies headache, vision changes, eye pain, numbness or weakness of his extremities, neck pain, low back pain, chest pain, shortness of breath, abdominal pain, nausea, or vomiting.  He is complaining of aching pain to his hands and knees where he sustained superficial abrasions. Pain does not radiate.  He is currently anticoagulated on Eliquis.  He is alert and oriented x4.     The history is provided by the patient.    Past Medical History:  Diagnosis Date   AAA (abdominal aortic aneurysm) ([place])    Anemia due to chronic blood loss 11/25/2008   Recurrent over the years EGD and colonoscopy x 2 each 2004 and 2010 without cause     Atrial fibrillation (HCC)    BPH (benign prostatic hyperplasia)    reports aprocedure (TURP) remotely in [place].Marland KitchenNo futher f/u w/ urology   CAD (coronary artery disease)    Diabetes mellitus, type 2 ([place])    Diverticulosis    left colon   Erosive gastritis    Fall 08/08/2016   OUTSIDE AT HOME FACIAL TRAUMA    GERD (gastroesophageal reflux disease)    Hyperlipidemia    Hypertension    Hypothyroidism    Insomnia    transient   Osteopenia    dexa 2-11   Vitamin B12 deficiency     Patient Active Problem List   Diagnosis Date Noted   Depression 03/12/2017   Rotator cuff tear, right 08/18/2016   HLD  (hyperlipidemia) 08/18/2016   Nasal bone fracture 08/18/2016   SAH (subarachnoid hemorrhage) ([place]) 08/08/2016   PCP NOTES >>>>>>>>>>>>>>>>>>>> 09/28/2015   Vasomotor rhinitis 09/14/2014   Hiatal hernia 01/28/2013   Erosive gastritis 01/28/2013   BPH (benign prostatic hyperplasia) 06/07/2009   Osteopenia 06/07/2009   Anemia due to chronic blood loss 11/25/2008   Vitamin B 12 deficiency 06/01/2008   HTN (hypertension) 06/01/2008   PACEMAKER, PERMANENT 06/01/2008   GERD 03/31/2007   Hypothyroidism 01/26/2007   DM (diabetes mellitus) with complications ([place]) 88/41/6606   Permanent atrial fibrillation 01/26/2007   Dyslipidemia 10/30/2006   Coronary artery disease involving native coronary artery of native heart without angina pectoris 10/02/2006   HIP REPLACEMENT, RIGHT, HX OF 02/04/2003    Past Surgical History:  Procedure Laterality Date   CARDIAC CATHETERIZATION  01/31/06, 09/25/10, 09-2011   CATARACT EXTRACTION     right   CHOLECYSTECTOMY, LAPAROSCOPIC     COLONOSCOPY     multiple   CORONARY ARTERY BYPASS GRAFT     1999 stents in 2000   ESOPHAGOGASTRODUODENOSCOPY     multiple   GIVENS CAPSULE STUDY N/A 11/04/2012   Procedure: GIVENS CAPSULE STUDY;  Surgeon: Gatha Mayer, MD;  Location: [facility];  Service: Endoscopy;  Laterality: N/A;   inguinal herniorrhaphies     bilateral   LEFT HEART CATHETERIZATION WITH CORONARY ANGIOGRAM N/A 10/04/2011  Procedure: LEFT HEART CATHETERIZATION WITH CORONARY ANGIOGRAM;  Surgeon: Burnell Blanks, MD;  Location: [facility] CATH LAB;  Service: Cardiovascular;  Laterality: N/A;   pacemaker  1980, 1995, 2013   medtronic minix 8341   PENILE PROSTHESIS IMPLANT  1992   PERMANENT PACEMAKER GENERATOR CHANGE N/A 06/05/2011   Procedure: PERMANENT PACEMAKER GENERATOR CHANGE;  Surgeon: Evans Lance, MD;  Location: [facility] CATH LAB;  Service: Cardiovascular;  Laterality: N/A;   PROSTATECTOMY     transurethral   right hip  replacement     TRANSTHORACIC ECHOCARDIOGRAM  12/2006        Home Medications    Prior to Admission medications   Medication Sig Start Date End Date Taking? Authorizing Provider  ACCU-CHEK FASTCLIX LANCETS MISC Used to check blood sugars 2x daily. 11/10/17   Renato Shin, MD  acetaminophen (TYLENOL) 325 MG tablet Take 650 mg by mouth every 6 (six) hours as needed for mild pain or moderate pain.     [provider]  apixaban (ELIQUIS) 5 MG TABS tablet Take 1 tablet (5 mg total) by mouth 2 (two) times daily. 09/22/17   Colon Branch, MD  azelastine (ASTELIN) 0.1 % nasal spray Place 2 sprays into both nostrils at bedtime as needed for rhinitis or allergies. Use in each nostril as directed 11/18/18   Colon Branch, MD  Blood Glucose Monitoring Suppl (ACCU-CHEK GUIDE) w/Device KIT 1 Device by Does not apply route daily. 11/10/17   Renato Shin, MD  digoxin (LANOXIN) 0.125 MG tablet Take 1 tablet (0.125 mg total) by mouth every other day. 12/11/17   Evans Lance, MD  dorzolamide-timolol (COSOPT) 22.3-6.8 MG/ML ophthalmic solution Place 1 drop into both eyes 2 (two) times daily.    [provider]  escitalopram (LEXAPRO) 10 MG tablet Take 1 tablet (10 mg total) by mouth daily. 05/14/18   Colon Branch, MD  fenofibrate micronized (LOFIBRA) 134 MG capsule Take 1 capsule (134 mg total) by mouth daily. 08/31/18   Colon Branch, MD  glucose blood (ACCU-CHEK GUIDE) test strip Used to check blood sugars 2x daily. 11/10/17   Renato Shin, MD  Insulin Glargine (LANTUS SOLOSTAR) 100 UNIT/ML Solostar Pen Inject 38 Units into the skin every morning. 02/11/18   Renato Shin, MD  Insulin Pen Needle 31G X 6 MM MISC To use w/ Lantus 11/06/17   Renato Shin, MD  ipratropium (ATROVENT) 0.06 % nasal spray Place 2 sprays into both nostrils 3 (three) times daily. 12/04/18   Colon Branch, MD  latanoprost (XALATAN) 0.005 % ophthalmic solution Place 1 drop into both eyes at bedtime. 06/07/16   [provider]    levothyroxine (SYNTHROID) 100 MCG tablet Take 1 tablet (100 mcg total) by mouth daily before breakfast. 04/17/18   Colon Branch, MD  metoprolol tartrate (LOPRESSOR) 25 MG tablet Take 1 tablet (25 mg total) by mouth 2 (two) times daily. 06/02/17   Colon Branch, MD  mirtazapine (REMERON) 15 MG tablet Take 1 tablet (15 mg total) by mouth at bedtime. 10/24/17   Colon Branch, MD  nitroGLYCERIN (NITROSTAT) 0.4 MG SL tablet Place 1 tablet (0.4 mg total) under the tongue every 5 (five) minutes x 3 doses as needed for chest pain. Then contact 911 or go to ER Patient not taking: Reported on 10/14/2018 12/09/16   Colon Branch, MD  omeprazole (PRILOSEC) 40 MG capsule Take 1 capsule (40 mg total) by mouth daily. 04/16/17   Colon Branch, MD  pantoprazole (PROTONIX) 40 MG tablet Take 1 tablet (40 mg total) by mouth daily before breakfast. 05/14/18   Colon Branch, MD  pravastatin (PRAVACHOL) 40 MG tablet Take 2 tablets (80 mg total) by mouth daily. 10/07/18   Colon Branch, MD  sitaGLIPtin (JANUVIA) 100 MG tablet Take 1 tablet (100 mg total) by mouth daily. 05/14/18   Colon Branch, MD  triamcinolone ointment (KENALOG) 0.1 %  01/16/17   [provider]  vitamin B-12 (CYANOCOBALAMIN) 1000 MCG tablet Take 1 tablet (1,000 mcg total) by mouth daily. 10/02/15   Colon Branch, MD    Family History Family History  Problem Relation Age of Onset   Alzheimer's disease Mother    Heart failure Father    CVA Father    Coronary artery disease Brother        cabg   Alzheimer's disease Sister    Mental illness Sister    Alzheimer's disease Sister    Other Sister        lung problems   Diabetes Other        several siblings   Prostate cancer Neg Hx    Colon cancer Neg Hx    Esophageal cancer Neg Hx    Stomach cancer Neg Hx    Rectal cancer Neg Hx     Social History Social History   Tobacco Use   Smoking status: Never Smoker   Smokeless tobacco: Never Used  Substance Use Topics   Alcohol use: Yes     Alcohol/week: 1.0 - 2.0 standard drinks    Types: 1 - 2 Glasses of wine per week    Comment: socially    Drug use: No     Allergies   Ace inhibitors, Codeine phosphate, and Hydrochlorothiazide w-triamterene   Review of Systems Review of Systems  Constitutional: Negative for chills and fever.  Eyes: Negative for photophobia and visual disturbance.  Respiratory: Negative for chest tightness and shortness of breath.   Gastrointestinal: Negative for abdominal pain, nausea and vomiting.  Musculoskeletal: Positive for arthralgias. Negative for back pain and neck pain.  Skin: Positive for wound.  Neurological: Negative for syncope, weakness, numbness and headaches.  All other systems reviewed and are negative.    Physical Exam Updated Vital Signs BP 116/63 (BP Location: Left Arm) Comment: Simultaneous filing. User may not have seen previous data.   Pulse 66    Temp 98.1 F (36.7 C) (Oral)    Resp 18    SpO2 98%   Physical Exam Vitals signs and nursing note reviewed.  Constitutional:      General: He is not in acute distress.    Appearance: He is well-developed.  HENT:     Head: Normocephalic.     Comments: Superficial abrasion measuring 6cm noted to the left forehead, bleeding controlled.   1 cm laceration of the left lower eyelid, bleeding controlled.  Some surrounding ecchymosis and swelling noted.  No hemotympanum bilaterally.  No battle signs, no rhinorrhea.  No tenderness to palpation of the face or skull outside of the abrasions listed above.  No jaw malocclusion. Eyes:     General:        Right eye: No discharge.        Left eye: No discharge.     Extraocular Movements: Extraocular movements intact.     Conjunctiva/sclera: Conjunctivae normal.     Pupils: Pupils are equal, round, and reactive to light.     Comments: No pain or restriction with  EOMs.  No chemosis, proptosis, or consensual photophobia.  Neck:     Vascular: No JVD.     Trachea: No tracheal deviation.      Comments: C-collar in place Cardiovascular:     Rate and Rhythm: Normal rate.     Pulses: Normal pulses.     Comments: 2+ radial and DP/PT pulses bilaterally, no lower extremity edema, no palpable cords, compartments are soft  Pulmonary:     Effort: Pulmonary effort is normal.     Breath sounds: Normal breath sounds.     Comments: Atraumatic examination with no crepitus, deformity, ecchymosis, or flail segment Chest:     Chest wall: No tenderness.  Abdominal:     General: Abdomen is flat. Bowel sounds are normal. There is no distension.     Palpations: Abdomen is soft.     Tenderness: There is no abdominal tenderness. There is no guarding or rebound.     Comments: Atraumatic examination.  Musculoskeletal:        General: Tenderness and signs of injury present. No swelling.     Comments: Superficial abrasions to the dorsum of the bilateral hands along the distal third fourth and fifth digits.  Bleeding controlled.  Abrasion to the left shoulder anteriorly. No pain with range of motion of the shoulder joint. No crepitus or deformity.   Superficial abrasions to the bilateral knees.  Diffuse generalized tenderness to palpation of the knees bilaterally but no restricted range of motion and he is able to flex and extend the knees without difficulty.  He is able to extend the lower extremities against gravity and resistance bilaterally without difficulty.  5/5 strength of BUE and BLE major muscle groups.No midline spine TTP, no paraspinal muscle tenderness, no deformity, crepitus, or step-off noted.  Pelvis is stable on palpation.   Skin:    General: Skin is warm and dry.     Findings: No erythema.  Neurological:     Mental Status: He is alert and oriented to person, place, and time.     Comments: Mental Status:  Alert, thought content appropriate, able to give a coherent history. Speech fluent without evidence of aphasia. Able to follow 2 step commands without difficulty.  Cranial  Nerves:  II:  Peripheral visual fields grossly normal, pupils equal, round, reactive to light III,IV, VI: ptosis not present, extra-ocular motions intact bilaterally  V,VII: smile symmetric, facial light touch sensation equal VIII: hearing grossly normal to voice  X: uvula elevates symmetrically  XI: bilateral shoulder shrug symmetric and strong XII: midline tongue extension without fassiculations Motor:  Normal tone. 5/5 strength of BUE and BLE major muscle groups including strong and equal grip strength and dorsiflexion/plantar flexion Sensory: light touch normal in all extremities.   Psychiatric:        Behavior: Behavior normal.      ED Treatments / Results  Labs (all labs ordered are listed, but only abnormal results are displayed) Labs Reviewed  CBG MONITORING, ED - Abnormal; Notable for the following components:      Result Value   Glucose-Capillary 191 (*)    All other components within normal limits    EKG EKG Interpretation  Date/Time:  Wednesday December 30 2018 18:24:49 EDT Ventricular Rate:  65 PR Interval:    QRS Duration: 196 QT Interval:  514 QTC Calculation: 534 R Axis:   -148 Text Interpretation:  Ventricular-paced rhythm with occasional Premature ventricular complexes Abnormal ECG Confirmed by Lennice Sites [phone]) on 12/30/2018 7:00:41 PM  Radiology Dg Pelvis 1-2 Views  Result Date: 01/01/2019 CLINICAL DATA:  Fall.  Generalized pelvic pain. EXAM: Pelvis-one view: COMPARISON:  None. FINDINGS: Prior right hip replacement. No hardware or bony complicating feature. Penile implant noted. No acute fracture, subluxation or dislocation. IMPRESSION: No acute bony abnormality.  Prior right hip replacement. Electronically Signed   By: Rolm Baptise M.D.   On: 12/31/2018 16:11   Ct Head Wo Contrast  Result Date: 12/31/2018 CLINICAL DATA:  Fall.  On anticoagulation EXAM: CT HEAD WITHOUT CONTRAST CT MAXILLOFACIAL WITHOUT CONTRAST CT CERVICAL SPINE WITHOUT CONTRAST  TECHNIQUE: Multidetector CT imaging of the head, cervical spine, and maxillofacial structures were performed using the standard protocol without intravenous contrast. Multiplanar CT image reconstructions of the cervical spine and maxillofacial structures were also generated. COMPARISON:  CT head 09/06/2016 FINDINGS: CT HEAD FINDINGS Brain: Moderate to advanced atrophy. Extensive chronic microvascular ischemic change in the white matter and basal ganglia bilaterally. Negative for acute infarct, hemorrhage, mass Vascular: Atherosclerotic calcification of the cavernous carotid and middle cerebral arteries bilaterally. Negative for hyperdense vessel Skull: Negative Other: None CT MAXILLOFACIAL FINDINGS Osseous: Chronic nasal bone fracture, displaced to the right, unchanged from prior study. No acute facial fracture. Orbits: Bilateral cataract surgery.  No orbital mass or edema Sinuses: Mild mucosal edema paranasal sinuses.  No air-fluid level. Soft tissues: Small contusion left superolateral orbit. CT CERVICAL SPINE FINDINGS Alignment: Mild anterolisthesis C3-4. Mild retrolisthesis C6-7. Straightening of the cervical lordosis. Skull base and vertebrae: Negative for cervical spine fracture Soft tissues and spinal canal: Negative Disc levels: Multilevel disc and facet degeneration throughout the cervical spine. Multilevel spinal and foraminal stenosis due to spurring Upper chest: Negative Other: None IMPRESSION: 1. Atrophy and chronic microvascular ischemia. No acute intracranial abnormality 2. Negative for acute facial fracture. Chronic nasal bone fracture displaced to the right. 3. Cervical spondylosis.  Negative for cervical fracture. Electronically Signed   By: Franchot Gallo M.D.   On: 12/31/2018 16:26   Ct Cervical Spine Wo Contrast  Result Date: 12/31/2018 CLINICAL DATA:  Fall.  On anticoagulation EXAM: CT HEAD WITHOUT CONTRAST CT MAXILLOFACIAL WITHOUT CONTRAST CT CERVICAL SPINE WITHOUT CONTRAST TECHNIQUE:  Multidetector CT imaging of the head, cervical spine, and maxillofacial structures were performed using the standard protocol without intravenous contrast. Multiplanar CT image reconstructions of the cervical spine and maxillofacial structures were also generated. COMPARISON:  CT head 09/06/2016 FINDINGS: CT HEAD FINDINGS Brain: Moderate to advanced atrophy. Extensive chronic microvascular ischemic change in the white matter and basal ganglia bilaterally. Negative for acute infarct, hemorrhage, mass Vascular: Atherosclerotic calcification of the cavernous carotid and middle cerebral arteries bilaterally. Negative for hyperdense vessel Skull: Negative Other: None CT MAXILLOFACIAL FINDINGS Osseous: Chronic nasal bone fracture, displaced to the right, unchanged from prior study. No acute facial fracture. Orbits: Bilateral cataract surgery.  No orbital mass or edema Sinuses: Mild mucosal edema paranasal sinuses.  No air-fluid level. Soft tissues: Small contusion left superolateral orbit. CT CERVICAL SPINE FINDINGS Alignment: Mild anterolisthesis C3-4. Mild retrolisthesis C6-7. Straightening of the cervical lordosis. Skull base and vertebrae: Negative for cervical spine fracture Soft tissues and spinal canal: Negative Disc levels: Multilevel disc and facet degeneration throughout the cervical spine. Multilevel spinal and foraminal stenosis due to spurring Upper chest: Negative Other: None IMPRESSION: 1. Atrophy and chronic microvascular ischemia. No acute intracranial abnormality 2. Negative for acute facial fracture. Chronic nasal bone fracture displaced to the right. 3. Cervical spondylosis.  Negative for cervical fracture. Electronically Signed   By: Franchot Gallo  M.D.   On: 12/31/2018 16:26   Dg Knee Complete 4 Views Left  Result Date: 01/01/2019 CLINICAL DATA:  Bilateral knee since fall 12/30/2018. Initial encounter. EXAM: LEFT KNEE - COMPLETE 4+ VIEW COMPARISON:  None. FINDINGS: No evidence of fracture,  dislocation, or joint effusion. Mild osteophytosis is noted. Joint spaces are preserved. Chondrocalcinosis noted. Atherosclerosis is seen. IMPRESSION: No acute abnormality. Mild osteoarthritis and chondrocalcinosis. Electronically Signed   By: Inge Rise M.D.   On: 01/01/2019 10:09   Dg Knee Complete 4 Views Right  Result Date: 12/31/2018 CLINICAL DATA:  Recent fall with knee pain, initial encounter EXAM: RIGHT KNEE - COMPLETE 4+ VIEW COMPARISON:  None. FINDINGS: Mild medial joint space narrowing is noted. No acute fracture or dislocation is noted. Patellofemoral degenerative changes are noted as well. Changes consistent with prior vein harvest medially are seen. IMPRESSION: Chronic changes without acute abnormality. Electronically Signed   By: Inez Catalina M.D.   On: 12/31/2018 11:43   Ct Maxillofacial Wo Cm  Result Date: 12/31/2018 CLINICAL DATA:  Fall.  On anticoagulation EXAM: CT HEAD WITHOUT CONTRAST CT MAXILLOFACIAL WITHOUT CONTRAST CT CERVICAL SPINE WITHOUT CONTRAST TECHNIQUE: Multidetector CT imaging of the head, cervical spine, and maxillofacial structures were performed using the standard protocol without intravenous contrast. Multiplanar CT image reconstructions of the cervical spine and maxillofacial structures were also generated. COMPARISON:  CT head 09/06/2016 FINDINGS: CT HEAD FINDINGS Brain: Moderate to advanced atrophy. Extensive chronic microvascular ischemic change in the white matter and basal ganglia bilaterally. Negative for acute infarct, hemorrhage, mass Vascular: Atherosclerotic calcification of the cavernous carotid and middle cerebral arteries bilaterally. Negative for hyperdense vessel Skull: Negative Other: None CT MAXILLOFACIAL FINDINGS Osseous: Chronic nasal bone fracture, displaced to the right, unchanged from prior study. No acute facial fracture. Orbits: Bilateral cataract surgery.  No orbital mass or edema Sinuses: Mild mucosal edema paranasal sinuses.  No air-fluid  level. Soft tissues: Small contusion left superolateral orbit. CT CERVICAL SPINE FINDINGS Alignment: Mild anterolisthesis C3-4. Mild retrolisthesis C6-7. Straightening of the cervical lordosis. Skull base and vertebrae: Negative for cervical spine fracture Soft tissues and spinal canal: Negative Disc levels: Multilevel disc and facet degeneration throughout the cervical spine. Multilevel spinal and foraminal stenosis due to spurring Upper chest: Negative Other: None IMPRESSION: 1. Atrophy and chronic microvascular ischemia. No acute intracranial abnormality 2. Negative for acute facial fracture. Chronic nasal bone fracture displaced to the right. 3. Cervical spondylosis.  Negative for cervical fracture. Electronically Signed   By: Franchot Gallo M.D.   On: 12/31/2018 16:26    Procedures .Marland KitchenLaceration Repair  Date/Time: 12/30/2018 11:37 PM Performed by: Renita Papa, PA-C Authorized by: Renita Papa, PA-C   Consent:    Consent obtained:  Verbal   Consent given by:  Patient   Risks discussed:  Infection, need for additional repair, pain, poor cosmetic result and poor wound healing   Alternatives discussed:  No treatment and delayed treatment Universal protocol:    Procedure explained and questions answered to patient or proxy's satisfaction: yes     Relevant documents present and verified: yes     Test results available and properly labeled: yes     Imaging studies available: yes     Required blood products, implants, devices, and special equipment available: yes     Site/side marked: yes     Immediately prior to procedure, a time out was called: yes     Patient identity confirmed:  Verbally with patient Anesthesia (see MAR for exact dosages):  Anesthesia method:  None Laceration details:    Location:  Face   Face location:  Forehead   Length (cm):  6   Depth (mm):  1 Repair type:    Repair type:  Simple Pre-procedure details:    Preparation:  Imaging obtained to evaluate for  foreign bodies Exploration:    Hemostasis achieved with:  Direct pressure   Wound exploration: wound explored through full range of motion and entire depth of wound probed and visualized     Wound extent: no underlying fracture noted     Contaminated: yes   Treatment:    Area cleansed with:  Saline and Shur-Clens   Amount of cleaning:  Extensive   Irrigation solution:  Sterile saline   Irrigation method:  Syringe   Visualized foreign bodies/material removed: no   Skin repair:    Repair method:  Steri-Strips   Number of Steri-Strips:  4 Approximation:    Approximation:  Close Post-procedure details:    Dressing:  Open (no dressing)   Patient tolerance of procedure:  Tolerated well, no immediate complications .Marland KitchenLaceration Repair  Date/Time: 12/30/2018 11:38 PM Performed by: Renita Papa, PA-C Authorized by: Renita Papa, PA-C   Consent:    Consent obtained:  Verbal   Consent given by:  Patient   Risks discussed:  Infection, pain, poor cosmetic result and poor wound healing   Alternatives discussed:  No treatment and delayed treatment Laceration details:    Location:  Face   Face location:  L lower eyelid   Extent:  Superficial   Length (cm):  1   Depth (mm):  1 Repair type:    Repair type:  Simple Pre-procedure details:    Preparation:  Patient was prepped and draped in usual sterile fashion and imaging obtained to evaluate for foreign bodies Exploration:    Hemostasis achieved with:  Direct pressure   Wound exploration: wound explored through full range of motion and entire depth of wound probed and visualized     Wound extent: no underlying fracture noted     Contaminated: yes   Treatment:    Area cleansed with:  Shur-Clens and saline   Amount of cleaning:  Extensive   Irrigation solution:  Sterile water   Irrigation method:  Syringe Skin repair:    Repair method:  Steri-Strips   Number of Steri-Strips:  4 Approximation:    Approximation:  Close Post-procedure  details:    Dressing:  Open (no dressing)   Patient tolerance of procedure:  Tolerated well, no immediate complications   (including critical care time)  Medications Ordered in ED Medications  fentaNYL (SUBLIMAZE) injection 50 mcg (50 mcg Intravenous Given 12/30/18 1953)     Initial Impression / Assessment and Plan / ED Course  I have reviewed the triage vital signs and the nursing notes.  Pertinent labs & imaging results that were available during my care of the patient were reviewed by me and considered in my medical decision making (see chart for details).        Patient presenting for evaluation status post mechanical fall.  There was no prodrome leading up to the fall and I have a low suspicion of CVA, TIA, MI, or PE.  He is afebrile, vital signs are stable.  He is neurovascularly intact with no focal neurologic deficits on examination.  He sustained multiple superficial abrasions including the left forehead, dorsum of the digits of both hands, and the anterior aspect of both knees however no evidence of  serious extremity injury.  He has a small superficial laceration to the left lower eyelid that was a amenable to repair with Steri-Strips after extensive cleaning and visualization of the base of the wound in a bloodless field.  His tetanus is up-to-date.  His imaging shows no evidence of skull fracture, cervical spine injury, intracranial hemorrhage, or other acute osseous abnormality.  Remainder of his physical examination is atraumatic with no evidence of intrathoracic or intra-abdominal injury.  He is ambulatory with steady gait with minimal assistance and typically ambulates at home with the aid of a walker.  He is requesting home health services so our case manager was consulted and will follow-up.  On reevaluation patient resting comfortably no apparent distress.  He reports that he is feeling well and would like to go home.  He is tolerating p.o. without difficulty.  Discussed wound  care and strict ED return precautions.  Recommend follow-up with PCP for reevaluation of symptoms.  Patient verbalized understanding of and agreement with plan and patient stable for discharge home at this time.  I also spoke with the patient's daughter on the phone with his permission and updated her regarding the patient's care and condition.  Patient was seen and evaluated by Dr. Ronnald Nian who agrees with assessment and plan at this time.  Final Clinical Impressions(s) / ED Diagnoses   Final diagnoses:  Fall, initial encounter  Injury of head, initial encounter  Left eyelid laceration, initial encounter  Abrasion of forehead, initial encounter    ED Discharge Orders         [place]     12/30/18 2333    Face-to-face encounter (required for Medicare/Medicaid patients)    Comments: [place] certify that this patient is under my care and that I, or a nurse practitioner or physician's assistant working with me, had a face-to-face encounter that meets the physician face-to-face encounter requirements with this patient on 12/30/2018. The encounter with the patient was in whole, or in part for the following medical condition(s) which is the primary reason for home health care (List medical condition): cervical spondylosis, frequent falls   12/30/18 2333           Renita Papa, PA-C 01/01/19 [place], [place], DO 01/08/19 1225

## 2018-12-30 NOTE — ED Notes (Signed)
Cleaned pts head wound.

## 2018-12-30 NOTE — ED Provider Notes (Signed)
Medical screening examination/treatment/procedure(s) were conducted as a shared visit with non-physician practitioner(s) and myself.  I personally evaluated the patient during the encounter. Briefly, the patient is a 85 y.o. male with history of CAD, hypertension, high cholesterol, atrial fibrillation on blood thinner who presents the ED after mechanical fall.  Patient with abrasion over the left side of his forehead, left side of his face.  Neurologically he is intact.  No chest pain, no shortness of breath.  Has bruising to both knees.  No upper extremity pain.  Clear breath sounds.  Typically ambulates with a walker.  Will get CT images and x-rays to rule out injury.  Patient lives at home by himself.  Has had slow physical decline over the last several weeks to months.  Will engage case management for some assistance at home.  Anticipate discharge to home if imaging is unremarkable.   EKG Interpretation  Date/Time:  Wednesday December 30 2018 18:24:49 EDT Ventricular Rate:  65 PR Interval:    QRS Duration: 196 QT Interval:  514 QTC Calculation: 534 R Axis:   -148 Text Interpretation:  Ventricular-paced rhythm with occasional Premature ventricular complexes Abnormal ECG Confirmed by Lennice Sites [phone]) on 12/30/2018 7:00:41 PM           Lennice Sites, DO 12/30/18 2126

## 2018-12-30 NOTE — ED Notes (Signed)
Meal given to pt per provider  

## 2018-12-30 NOTE — TOC Initial Note (Signed)
Transition of Care [facility]) - Initial/Assessment Note    Patient Details  Name: [name] MRN: [id number] Date of Birth: 20-Jan-1926  [facility] CM/SW Contact:    Kennette Cuthrell Dimitri Ped, LCSW Phone Number: 12/30/2018, 10:45 PM  Clinical Narrative:     CSW at bedside to address consult. CSW completed TOC assessment. Per pt report, pt resides at home alone with the assistance of a daughter who also lives in the same neighborhood [name] [place], [place]: [phone]). [name] assists with things such as meals, transportation to medical appointments and house hold chores. Pt explains that as of lately, its become more difficult to care for himself and completing ADLS. Pt goes into detail and explains that he has a hx of falls and has fallen 3x within the past day or so.   Pt states that he has participated in short term rehab before at a SNF, [facility].  Pt is agreeable to Home health or SNF.   CSW in contact with Marlane Mingle to update her on the status of his care. [name] is also agreeable that patient needs more assistance in the home. [name] is receptive to the idea of Home health or SNF as well.   TOC team will continue to follow pt for any discharge needs.   [place] Transitions of Care  Clinical Social Worker  Ph: [phone]           Expected Discharge Plan: [place] Services Barriers to Discharge: Continued Medical Work up   Patient Goals and CMS Choice Patient states their goals for this hospitalization and ongoing recovery are:: Pt is unsure at this time but agrees that he needs more assistance with ADLs.      Expected Discharge Plan and Services Expected Discharge Plan: [place] In-house Referral: Clinical Social Work Discharge Planning Services: CM Consult Post Acute Care Choice: [place], Home Health(Pt and family are agreeable to either) Living arrangements for the past 2 months: Single Family  Home                                      Prior Living Arrangements/Services Living arrangements for the past 2 months: Single Family Home Lives with:: Self Patient language and need for interpreter reviewed:: Yes Do you feel safe going back to the place where you live?: Yes      Need for Family Participation in Patient Care: Yes (Comment) Care giver support system in place?: Yes (comment) Current home services: DME Criminal Activity/Legal Involvement Pertinent to Current Situation/Hospitalization: No - Comment as needed  Activities of Daily Living      Permission Sought/Granted Permission sought to share information with : Case Manager, Family Supports Permission granted to share information with : Yes, Verbal Permission Granted  Share Information with NAME: Marlane Mingle     Permission granted to share info w Relationship: Daughter  Permission granted to share info w Contact Information: PH: [phone]  Emotional Assessment Appearance:: Appears stated age Attitude/Demeanor/Rapport: Gracious, Engaged Affect (typically observed): Accepting, Calm Orientation: : Oriented to Self, Oriented to Place, Oriented to  Time, Oriented to Situation Alcohol / Substance Use: Not Applicable Psych Involvement: No (comment)  Admission diagnosis:  fall on blood thinners Patient Active Problem List   Diagnosis Date Noted  . Depression 03/12/2017  . Rotator cuff tear, right 08/18/2016  . HLD (hyperlipidemia)  08/18/2016  . Nasal bone fracture 08/18/2016  . SAH (subarachnoid hemorrhage) ([place]) 08/08/2016  . PCP NOTES >>>>>>>>>>>>>>>>>>>> 09/28/2015  . Vasomotor rhinitis 09/14/2014  . Hiatal hernia 01/28/2013  . Erosive gastritis 01/28/2013  . BPH (benign prostatic hyperplasia) 06/07/2009  . Osteopenia 06/07/2009  . Anemia due to chronic blood loss 11/25/2008  . Vitamin B 12 deficiency 06/01/2008  . HTN (hypertension) 06/01/2008  . PACEMAKER, PERMANENT 06/01/2008  . GERD  03/31/2007  . Hypothyroidism 01/26/2007  . DM (diabetes mellitus) with complications ([place]) 40/02/2724  . Permanent atrial fibrillation 01/26/2007  . Dyslipidemia 10/30/2006  . Coronary artery disease involving native coronary artery of native heart without angina pectoris 10/02/2006  . HIP REPLACEMENT, RIGHT, HX OF 02/04/2003   PCP:  Colon Branch, MD Pharmacy:   [facility], [address] [place] [address] Phone: [phone] Fax: [phone]     Social Determinants of Health (SDOH) Interventions    Readmission Risk Interventions No flowsheet data found.

## 2018-12-30 NOTE — ED Notes (Signed)
Ambulated pt to hallway and back

## 2018-12-30 NOTE — ED Notes (Signed)
Patient transported to CT 

## 2018-12-31 ENCOUNTER — Telehealth: Payer: Self-pay | Admitting: *Deleted

## 2018-12-31 LAB — CBG MONITORING, ED: Glucose-Capillary: 191 mg/dL — ABNORMAL HIGH (ref 70–99)

## 2018-12-31 NOTE — ED Notes (Signed)
Family Updated, OTW for pickup.

## 2018-12-31 NOTE — Discharge Instructions (Signed)
You can take 1 to 2 tablets of Tylenol (350mg -1000mg  depending on the dose) every 6 hours as needed for pain.  Do not exceed 4000 mg of Tylenol daily.  You can apply ice or heat to areas of soreness, whichever feels best.  20 minutes at a time 2-3 times daily.  Our case managers will help arrange for home health services.  Doing plenty of fluids and get plenty of rest.  Continue to use the aid of a walker to help you ambulate around her home.  Keep your wounds clean and dry.  You can wash the wounds on your extremities with antibacterial soap and warm water or apply antibiotic ointment.  The Steri-Strips on your face will fall off in about 5 days or so on their own.  Follow-up with your primary care provider for reevaluation of symptoms.  Return to the emergency department if any concerning signs or symptoms develop such as severe headaches, altered mental status, high fevers, weakness, loss of consciousness, persistent vomiting, or any signs of infection.

## 2018-12-31 NOTE — Telephone Encounter (Signed)
Desiraye Rolfson J. Clydene Laming, RN, BSN, [employer] [phone] Spoke with pt via telephone regarding discharge planning for [facility]. Offered pt list of home health agencies to choose from.  Pt chose [facility] to render services. Joen Laura of [facility]  notified. Patient made aware that [facility]  will be in contact in 24-48 hours.  No DME needs identified at this time.

## 2019-01-04 ENCOUNTER — Other Ambulatory Visit: Payer: Self-pay | Admitting: Internal Medicine

## 2019-01-05 ENCOUNTER — Telehealth: Payer: Self-pay | Admitting: *Deleted

## 2019-01-05 NOTE — Telephone Encounter (Signed)
Darlina Guys ([facility]) called to notify PCP that they will begin skilled nursing care for pt on 01/08/2019.   Darlina GuysSK:[phone]

## 2019-01-05 NOTE — Telephone Encounter (Signed)
Is he at home?  If that is the case, please asked Glenard Haring to do a TCM for his recent admission

## 2019-01-05 NOTE — Telephone Encounter (Signed)
Transition Care Management Follow-up Telephone Call   Date discharged? 12/31/18   How have you been since you were released from the hospital? "doing okay"   Do you understand why you were in the hospital? "I fell"   Do you understand the discharge instructions? Yes   Where were you discharged to? Home. My daughters check on me often. I use walker. Lives alone.  Items Reviewed:  Medications reviewed:pt states he doesn't have list on hand  Allergies reviewed: pt states no changes  Dietary changes reviewed: no  Referrals reviewed: no   Functional Questionnaire:   Activities of Daily Living (ADLs):   He states they are independent in the following: ambulation, bathing and hygiene, feeding, continence, grooming, toileting and dressing States they require assistance with the following: states his dtr supervises his activity   Any transportation issues/concerns?: no   Any patient concerns? no   Pt states he will have his daughter call back to office to schedule follow up appt. She is not there at this time.  Confirmed with patient if condition begins to worsen call PCP or go to the ER.  Patient was given the office number and encouraged to call back with question or concerns.  : yes

## 2019-01-05 NOTE — Telephone Encounter (Signed)
FYI

## 2019-01-07 ENCOUNTER — Telehealth: Payer: Self-pay | Admitting: Internal Medicine

## 2019-01-07 NOTE — Telephone Encounter (Signed)
Just an FYI

## 2019-01-07 NOTE — Telephone Encounter (Signed)
Darlina Guys from [facility] is calling to notify [name] that there will be a delay of care because the patient would like [facility] to come out on 01/14/2019.  [phone]

## 2019-01-20 ENCOUNTER — Telehealth: Payer: Self-pay | Admitting: Internal Medicine

## 2019-01-20 NOTE — Telephone Encounter (Signed)
New message   Patient needs to know if he needs to come in for a check of his device (wants to know hen his batteries will run out). Please advise.

## 2019-01-20 NOTE — Telephone Encounter (Signed)
Question concerning battery life on pacemaker. July 15th, 2020 showed battery life of 10 months.Transmission scheduled for 01/22/19 and will schedule for monthly battery checks after receive transmission.

## 2019-01-22 ENCOUNTER — Ambulatory Visit (INDEPENDENT_AMBULATORY_CARE_PROVIDER_SITE_OTHER): Payer: Medicare HMO | Admitting: *Deleted

## 2019-01-22 DIAGNOSIS — I4821 Permanent atrial fibrillation: Secondary | ICD-10-CM

## 2019-01-22 LAB — CUP PACEART REMOTE DEVICE CHECK
Date Time Interrogation Session: 20200911071802
Implantable Lead Implant Date: 19950413
Implantable Lead Location: 753860
Implantable Pulse Generator Implant Date: 20130123

## 2019-01-29 NOTE — Progress Notes (Signed)
Remote pacemaker transmission.   

## 2019-04-07 ENCOUNTER — Other Ambulatory Visit: Payer: Self-pay | Admitting: Internal Medicine

## 2019-04-23 ENCOUNTER — Ambulatory Visit (INDEPENDENT_AMBULATORY_CARE_PROVIDER_SITE_OTHER): Payer: Medicare HMO | Admitting: *Deleted

## 2019-04-23 DIAGNOSIS — Z950 Presence of cardiac pacemaker: Secondary | ICD-10-CM

## 2019-04-23 LAB — CUP PACEART REMOTE DEVICE CHECK
Date Time Interrogation Session: 20201211090140
Implantable Lead Implant Date: 19950413
Implantable Lead Location: 753860
Implantable Pulse Generator Implant Date: 20130123

## 2019-05-20 ENCOUNTER — Other Ambulatory Visit: Payer: Self-pay

## 2019-05-20 ENCOUNTER — Ambulatory Visit (INDEPENDENT_AMBULATORY_CARE_PROVIDER_SITE_OTHER): Payer: Medicare HMO | Admitting: Physician Assistant

## 2019-05-20 VITALS — BP 126/60 | HR 72 | Ht 67.0 in | Wt 148.0 lb

## 2019-05-20 DIAGNOSIS — I442 Atrioventricular block, complete: Secondary | ICD-10-CM | POA: Diagnosis not present

## 2019-05-20 DIAGNOSIS — I4821 Permanent atrial fibrillation: Secondary | ICD-10-CM

## 2019-05-20 DIAGNOSIS — I2510 Atherosclerotic heart disease of native coronary artery without angina pectoris: Secondary | ICD-10-CM | POA: Diagnosis not present

## 2019-05-20 DIAGNOSIS — I10 Essential (primary) hypertension: Secondary | ICD-10-CM | POA: Diagnosis not present

## 2019-05-20 DIAGNOSIS — Z950 Presence of cardiac pacemaker: Secondary | ICD-10-CM | POA: Diagnosis not present

## 2019-05-20 NOTE — Patient Instructions (Signed)
Medication Instructions:    Your physician recommends that you continue on your current medications as directed. Please refer to the Current Medication list given to you today.  *If you need a refill on your cardiac medications before your next appointment, please call your pharmacy*  Lab Work:  BMET  DIG AND CBC LEVEL    If you have labs (blood work) drawn today and your tests are completely normal, you will receive your results only by: [name] MyChart Message (if you have MyChart) OR . A paper copy in the mail If you have any lab test that is abnormal or we need to change your treatment, we will call you to review the results.  Testing/Procedures:  NONE ORDERED  TODAY   Follow-Up: At [facility], you and your health needs are our priority.  As part of our continuing mission to provide you with exceptional heart care, we have created designated Provider Care Teams.  These Care Teams include your primary Cardiologist (physician) and Advanced Practice Providers (APPs -  Physician Assistants and Nurse Practitioners) who all work together to provide you with the care you need, when you need it.  Your next appointment:   6 month(s)  The format for your next appointment:   In Person  Provider:    You may see   Tommye Standard, PA-C    Other Instructions

## 2019-05-20 NOTE — Progress Notes (Signed)
Cardiology Office Note Date:  05/20/2019  Patient ID:  [name], [name] 1925-09-04, MRN [id number] PCP:  Colon Branch, MD  Cardiologist:  Dr. Harrington Challenger Electrophysiologist: Dr. Lovena Le    Chief Complaint: over due visit  History of Present Illness: [name] is a 86 y.o. male with history of HTN, HLD, DM, CHB w/PPM, AFib >> permanent, CAD (CABG), AAA.  He last saw cardiology, Dr. Harrington Challenger Sept 2018, no ischemic symptoms, no recurrent syncope, mentioned AAA was small.  He comes in today to be seen for Dr. Lovena Le.  Last seen by EP service by Dr. Rayann Heman Feb 2020, was doing well, battery was about a year from ERI, planned for closer remote monitoring, and Dr. Lovena Le f/u in a year.  He is accompanied by his daughter with poor hearing.  He is doing very well, denies any kind of CP, palpitations or cardiac awareness, no dizzy spells, near syncope or syncope.  No SOB or DOE with his ADLs.  He reports compliance with his Eliquis and denies any bleeding, signs of bleeding.  He is quite worried that we won't get his pacer changed before it runs out, explains his heart was stopping when he got it placed and worries that his battery will run out and his heart will stop.   Device information MDT single chamber PPM, current device 06/05/2011 (3rd device) Original device 1995, current [employer] lead, CXR notes another abandoned RV lead as well    Past Medical History:  Diagnosis Date  . AAA (abdominal aortic aneurysm) ([place])   . Anemia due to chronic blood loss 11/25/2008   Recurrent over the years EGD and colonoscopy x 2 each 2004 and 2010 without cause    . Atrial fibrillation ([place])   . BPH (benign prostatic hyperplasia)    reports aprocedure (TURP) remotely in [place].Marland KitchenNo futher f/u w/ urology  . CAD (coronary artery disease)   . Diabetes mellitus, type 2 ([place])   . Diverticulosis    left colon  . Erosive gastritis   . Fall 08/08/2016   OUTSIDE AT HOME FACIAL TRAUMA   . GERD (gastroesophageal reflux  disease)   . Hyperlipidemia   . Hypertension   . Hypothyroidism   . Insomnia    transient  . Osteopenia    dexa 2-11  . Vitamin B12 deficiency     Past Surgical History:  Procedure Laterality Date  . CARDIAC CATHETERIZATION  01/31/06, 09/25/10, 09-2011  . CATARACT EXTRACTION     right  . CHOLECYSTECTOMY, LAPAROSCOPIC    . COLONOSCOPY     multiple  . CORONARY ARTERY BYPASS GRAFT     1999 stents in 2000  . ESOPHAGOGASTRODUODENOSCOPY     multiple  . GIVENS CAPSULE STUDY N/A 11/04/2012   Procedure: GIVENS CAPSULE STUDY;  Surgeon: Gatha Mayer, MD;  Location: [facility] ENDOSCOPY;  Service: Endoscopy;  Laterality: N/A;  . inguinal herniorrhaphies     bilateral  . LEFT HEART CATHETERIZATION WITH CORONARY ANGIOGRAM N/A 10/04/2011   Procedure: LEFT HEART CATHETERIZATION WITH CORONARY ANGIOGRAM;  Surgeon: Burnell Blanks, MD;  Location: [facility] CATH LAB;  Service: Cardiovascular;  Laterality: N/A;  . pacemaker  [place], 2013   medtronic minix [id number]  . PENILE PROSTHESIS IMPLANT  1992  . PERMANENT PACEMAKER GENERATOR CHANGE N/A 06/05/2011   Procedure: PERMANENT PACEMAKER GENERATOR CHANGE;  Surgeon: Evans Lance, MD;  Location: [facility] CATH LAB;  Service: Cardiovascular;  Laterality: N/A;  . PROSTATECTOMY     transurethral  . right  hip replacement    . TRANSTHORACIC ECHOCARDIOGRAM  12/2006    Current Outpatient Medications  Medication Sig Dispense Refill  . ACCU-CHEK FASTCLIX LANCETS MISC Used to check blood sugars 2x daily. 102 each 11  . acetaminophen (TYLENOL) 325 MG tablet Take 650 mg by mouth every 6 (six) hours as needed for mild pain or moderate pain.     [name] apixaban (ELIQUIS) 5 MG TABS tablet Take 1 tablet (5 mg total) by mouth 2 (two) times daily. 180 tablet 1  . azelastine (ASTELIN) 0.1 % nasal spray Place 2 sprays into both nostrils at bedtime as needed for rhinitis or allergies. Use in each nostril as directed 30 mL 12  . Blood Glucose Monitoring Suppl (ACCU-CHEK GUIDE) w/Device KIT  1 Device by Does not apply route daily. 1 kit 0  . digoxin (LANOXIN) 0.125 MG tablet TAKE 1 TABLET BY MOUTH EVERY OTHER DAY 45 tablet 6  . dorzolamide-timolol (COSOPT) 22.3-6.8 MG/ML ophthalmic solution Place 1 drop into both eyes 2 (two) times daily.    [name] escitalopram (LEXAPRO) 10 MG tablet Take 1 tablet (10 mg total) by mouth daily. 90 tablet 1  . fenofibrate micronized (LOFIBRA) 134 MG capsule Take 1 capsule (134 mg total) by mouth daily. 90 capsule 1  . glucose blood (ACCU-CHEK GUIDE) test strip Used to check blood sugars 2x daily. 100 each 12  . Insulin Glargine (LANTUS SOLOSTAR) 100 UNIT/ML Solostar Pen Inject 38 Units into the skin every morning. 5 pen 11  . Insulin Pen Needle 31G X 6 MM MISC To use w/ Lantus 100 each 12  . ipratropium (ATROVENT) 0.06 % nasal spray Place 2 sprays into both nostrils 3 (three) times daily. 15 mL 6  . latanoprost (XALATAN) 0.005 % ophthalmic solution Place 1 drop into both eyes at bedtime.    [name] levothyroxine (SYNTHROID) 100 MCG tablet Take 1 tablet (100 mcg total) by mouth daily before breakfast. 90 tablet 1  . metoprolol tartrate (LOPRESSOR) 25 MG tablet Take 1 tablet (25 mg total) by mouth 2 (two) times daily. 60 tablet 0  . mirtazapine (REMERON) 15 MG tablet Take 1 tablet (15 mg total) by mouth at bedtime. 90 tablet 1  . nitroGLYCERIN (NITROSTAT) 0.4 MG SL tablet Place 1 tablet (0.4 mg total) under the tongue every 5 (five) minutes x 3 doses as needed for chest pain. Then contact 911 or go to ER (Patient not taking: Reported on 10/14/2018) 25 tablet 3  . omeprazole (PRILOSEC) 40 MG capsule Take 1 capsule (40 mg total) by mouth daily. 90 capsule 3  . pantoprazole (PROTONIX) 40 MG tablet Take 1 tablet (40 mg total) by mouth daily before breakfast. 90 tablet 3  . pravastatin (PRAVACHOL) 40 MG tablet Take 2 tablets (80 mg total) by mouth daily. 180 tablet 1  . sitaGLIPtin (JANUVIA) 100 MG tablet Take 1 tablet (100 mg total) by mouth daily. 90 tablet 1  .  triamcinolone ointment (KENALOG) 0.1 %     . vitamin B-12 (CYANOCOBALAMIN) 1000 MCG tablet Take 1 tablet (1,000 mcg total) by mouth daily. 90 tablet 2   No current facility-administered medications for this visit.    Allergies:   Ace inhibitors, Codeine phosphate, and Hydrochlorothiazide w-triamterene   Social History:  The patient  reports that he has never smoked. He has never used smokeless tobacco. He reports current alcohol use of about 1.0 - 2.0 standard drinks of alcohol per week. He reports that he does not use drugs.   Family History:  The patient's family history includes Alzheimer's disease in his mother, sister, and sister; CVA in his father; Coronary artery disease in his brother; Diabetes in an other family member; Heart failure in his father; Mental illness in his sister; Other in his sister.  ROS:  Please see the history of present illness.    All other systems are reviewed and otherwise negative.   PHYSICAL EXAM:  VS:  BP 126/60   Pulse 72   Ht _0  (1.702 m)   Wt 148 lb (67.1 kg)   SpO2 99%   BMI 23.18 kg/m  BMI: Body mass index is 23.18 kg/m. Well nourished, well developed, in no acute distress  HEENT: normocephalic, atraumatic  Neck: no JVD, carotid bruits or masses Cardiac:  RRR; (paced)no significant murmurs, no rubs, or gallops Lungs:  CTA b/l, no wheezing, rhonchi or rales  Abd: soft, nontender MS: no deformity, thin body habitus, age appropriate atrophy Ext: no edema  Skin: warm and dry, no rash Neuro:  No gross deficits appreciated Psych: euthymic mood, full affect  PPM site (R) is stable, no tethering or discomfort   EKG:  Not done today  PPM interrogation done today and reviewed by myself:  Battery estimate is 8 months to ERI Lead measurements are good VP 80.2% No HVR    07/13/2016: TTE Study Conclusions - Left ventricle: The cavity size was normal. There was moderate   concentric hypertrophy. Systolic function was normal. The    estimated ejection fraction was in the range of 60% to 65%. Wall   motion was normal; there were no regional wall motion   abnormalities. Features are consistent with a pseudonormal left   ventricular filling pattern, with concomitant abnormal relaxation   and increased filling pressure (grade 2 diastolic dysfunction).   Doppler parameters are consistent with elevated ventricular   end-diastolic filling pressure. - Aortic valve: There was mild regurgitation. - Mitral valve: Structurally normal valve. There was mild   regurgitation. - Left atrium: The atrium was moderately dilated. - Right ventricle: Pacer wire or catheter noted in right ventricle.   Systolic function was normal. - Right atrium: The atrium was mildly dilated. Pacer wire or   catheter noted in right atrium. - Tricuspid valve: There was mild regurgitation. - Pulmonary arteries: Systolic pressure was mildly increased. PA   peak pressure: 39 mm Hg (S). - Inferior vena cava: The vessel was normal in size.     Recent Labs: No results found for requested labs within last 8760 hours.  No results found for requested labs within last 8760 hours.   CrCl cannot be calculated (Patient's most recent lab result is older than the maximum 21 days allowed.).   Wt Readings from Last 3 Encounters:  05/20/19 148 lb (67.1 kg)  09/16/18 161 lb 9.6 oz (73.3 kg)  07/01/18 164 lb 6.4 oz (74.6 kg)     Other studies reviewed: Additional studies/records reviewed today include: summarized above  ASSESSMENT AND PLAN:  1. PPM     Nearing ERI, I have asked device clinic to change him to monthly remote battery checks     Will see him back in 6 months    2. CAD     No anginal complaints     On BB, statin, no ASA with Eliquis     C/w Dr. Harrington Challenger  He is not fasting today, usually has labs done annually with his PMD, including llipids, but missed 2020. Lipids profile should be done fasting, will defer for  now  3. HTN     No  changes   4. Permanent AFib     CHA2DS2Vasc is 5, on Eliquis, appropriately dosed for last labs,weight     BMET, CBC, and dig level today (he has not taken yet)     Disposition: F/u with as above  Current medicines are reviewed at length with the patient today.  The patient did not have any concerns regarding medicines.  Venetia Night, PA-C 05/20/2019 2:50 PM     [address] [place] [address] [phone] (office)  [phone] (fax)

## 2019-05-21 LAB — BASIC METABOLIC PANEL
BUN/Creatinine Ratio: 20 (ref 10–24)
BUN: 21 mg/dL (ref 10–36)
CO2: 24 mmol/L (ref 20–29)
Calcium: 9.2 mg/dL (ref 8.6–10.2)
Chloride: 104 mmol/L (ref 96–106)
Creatinine, Ser: 1.05 mg/dL (ref 0.76–1.27)
GFR calc Af Amer: 71 mL/min/{1.73_m2} (ref >59)
GFR calc non Af Amer: 62 mL/min/{1.73_m2} (ref >59)
Glucose: 186 mg/dL — ABNORMAL HIGH (ref 65–99)
Potassium: 4.9 mmol/L (ref 3.5–5.2)
Sodium: 139 mmol/L (ref 134–144)

## 2019-05-21 LAB — CBC
Hematocrit: 42 % (ref 37.5–51.0)
Hemoglobin: 14.3 g/dL (ref 13.0–17.7)
MCH: 32.4 pg (ref 26.6–33.0)
MCHC: 34 g/dL (ref 31.5–35.7)
MCV: 95 fL (ref 79–97)
Platelets: 170 10*3/uL (ref 150–450)
RBC: 4.42 x10E6/uL (ref 4.14–5.80)
RDW: 12.6 % (ref 11.6–15.4)
WBC: 7.3 10*3/uL (ref 3.4–10.8)

## 2019-05-21 LAB — DIGOXIN LEVEL: Digoxin, Serum: <0.4 ng/mL — ABNORMAL LOW (ref 0.5–0.9)

## 2019-05-27 ENCOUNTER — Telehealth: Payer: Self-pay | Admitting: Internal Medicine

## 2019-05-27 NOTE — Telephone Encounter (Signed)
Copied from [place] [phone]. Topic: General - Call Back - No Documentation >> May 27, 2019 10:37 AM Erick Blinks wrote: Reason for CRM: Pt's daughter called requesting advice from PCP regarding covid vaccine, please advise  Best contact: [phone]

## 2019-05-27 NOTE — Telephone Encounter (Signed)
Advise patient: I encouraged him to get the vaccine as soon as it is available to him. Please give him the information from the "COVID19 vaccine" dot phrase regards to how to get that scheduled.

## 2019-05-29 NOTE — Progress Notes (Signed)
PPM Remote  

## 2019-05-31 ENCOUNTER — Telehealth: Payer: Self-pay

## 2019-05-31 NOTE — Telephone Encounter (Signed)
Spoke w/ [name]- informed of PCP recommendations. Leigh verbalized understanding.

## 2019-05-31 NOTE — Telephone Encounter (Signed)
Please advise 

## 2019-05-31 NOTE — Telephone Encounter (Signed)
Copied from [place] [phone]. Topic: General - Other >> May 28, 2019  4:27 PM Nils Flack wrote: Reason for CRM: daughter Marlane Mingle [phone] Pt is scheduled for covid vaccine next week and daughter is asking if it ok for pt to take.

## 2019-05-31 NOTE — Telephone Encounter (Signed)
Yes, I recommend him to proceed

## 2019-06-01 NOTE — Telephone Encounter (Signed)
Left detailed message on machine of [name] note and to let us know if she needs information on where to schedule vaccine.

## 2019-06-21 ENCOUNTER — Ambulatory Visit (INDEPENDENT_AMBULATORY_CARE_PROVIDER_SITE_OTHER): Payer: Medicare HMO | Admitting: *Deleted

## 2019-06-21 DIAGNOSIS — Z950 Presence of cardiac pacemaker: Secondary | ICD-10-CM

## 2019-06-21 LAB — CUP PACEART REMOTE DEVICE CHECK
Battery Impedance: 5404 Ohm
Battery Remaining Longevity: 7 mo
Battery Voltage: 2.67 V
Brady Statistic RV Percent Paced: 82 %
Date Time Interrogation Session: 20210208104022
Implantable Lead Implant Date: 19950413
Implantable Lead Location: 753860
Implantable Pulse Generator Implant Date: 20130123
Lead Channel Impedance Value: 0 Ohm
Lead Channel Impedance Value: 476 Ohm
Lead Channel Pacing Threshold Amplitude: 0.875 V
Lead Channel Pacing Threshold Pulse Width: 0.4 ms
Lead Channel Setting Pacing Amplitude: 2.5 V
Lead Channel Setting Pacing Pulse Width: 0.4 ms
Lead Channel Setting Sensing Sensitivity: 2.8 mV

## 2019-06-23 ENCOUNTER — Other Ambulatory Visit: Payer: Self-pay | Admitting: Internal Medicine

## 2019-07-22 ENCOUNTER — Ambulatory Visit (INDEPENDENT_AMBULATORY_CARE_PROVIDER_SITE_OTHER): Payer: Medicare HMO | Admitting: *Deleted

## 2019-07-22 DIAGNOSIS — Z950 Presence of cardiac pacemaker: Secondary | ICD-10-CM

## 2019-07-23 LAB — CUP PACEART REMOTE DEVICE CHECK
Battery Impedance: 5505 Ohm
Battery Remaining Longevity: 7 mo
Battery Voltage: 2.66 V
Brady Statistic RV Percent Paced: 83 %
Date Time Interrogation Session: 20210312141115
Implantable Lead Implant Date: 19950413
Implantable Lead Location: 753860
Implantable Pulse Generator Implant Date: 20130123
Lead Channel Impedance Value: 0 Ohm
Lead Channel Impedance Value: 487 Ohm
Lead Channel Pacing Threshold Amplitude: 0.625 V
Lead Channel Pacing Threshold Pulse Width: 0.4 ms
Lead Channel Setting Pacing Amplitude: 2.5 V
Lead Channel Setting Pacing Pulse Width: 0.4 ms
Lead Channel Setting Sensing Sensitivity: 4 mV

## 2019-08-04 ENCOUNTER — Other Ambulatory Visit: Payer: Self-pay | Admitting: Internal Medicine

## 2019-08-12 ENCOUNTER — Inpatient Hospital Stay (HOSPITAL_COMMUNITY)
Admission: EM | Admit: 2019-08-12 | Discharge: 2019-08-19 | DRG: 065 | Disposition: A | Discharge status: Skilled Nursing Facility | Payer: Medicare HMO | Attending: Internal Medicine | Admitting: Internal Medicine

## 2019-08-12 ENCOUNTER — Emergency Department (HOSPITAL_COMMUNITY): Payer: Medicare HMO

## 2019-08-12 ENCOUNTER — Telehealth: Payer: Self-pay | Admitting: Internal Medicine

## 2019-08-12 ENCOUNTER — Telehealth: Payer: Self-pay

## 2019-08-12 ENCOUNTER — Other Ambulatory Visit: Payer: Self-pay

## 2019-08-12 DIAGNOSIS — F0390 Unspecified dementia without behavioral disturbance: Secondary | ICD-10-CM | POA: Diagnosis present

## 2019-08-12 DIAGNOSIS — S7001XA Contusion of right hip, initial encounter: Secondary | ICD-10-CM | POA: Diagnosis not present

## 2019-08-12 DIAGNOSIS — M25551 Pain in right hip: Secondary | ICD-10-CM | POA: Diagnosis not present

## 2019-08-12 DIAGNOSIS — R296 Repeated falls: Secondary | ICD-10-CM | POA: Diagnosis present

## 2019-08-12 DIAGNOSIS — R9431 Abnormal electrocardiogram [ECG] [EKG]: Secondary | ICD-10-CM | POA: Diagnosis not present

## 2019-08-12 DIAGNOSIS — Y92230 Patient room in hospital as the place of occurrence of the external cause: Secondary | ICD-10-CM | POA: Diagnosis not present

## 2019-08-12 DIAGNOSIS — I442 Atrioventricular block, complete: Secondary | ICD-10-CM | POA: Diagnosis not present

## 2019-08-12 DIAGNOSIS — Z79890 Hormone replacement therapy (postmenopausal): Secondary | ICD-10-CM

## 2019-08-12 DIAGNOSIS — Z7901 Long term (current) use of anticoagulants: Secondary | ICD-10-CM

## 2019-08-12 DIAGNOSIS — S0990XA Unspecified injury of head, initial encounter: Secondary | ICD-10-CM | POA: Diagnosis not present

## 2019-08-12 DIAGNOSIS — M2550 Pain in unspecified joint: Secondary | ICD-10-CM | POA: Diagnosis not present

## 2019-08-12 DIAGNOSIS — Z951 Presence of aortocoronary bypass graft: Secondary | ICD-10-CM | POA: Diagnosis not present

## 2019-08-12 DIAGNOSIS — F39 Unspecified mood [affective] disorder: Secondary | ICD-10-CM | POA: Diagnosis present

## 2019-08-12 DIAGNOSIS — Z888 Allergy status to other drugs, medicaments and biological substances status: Secondary | ICD-10-CM

## 2019-08-12 DIAGNOSIS — Z823 Family history of stroke: Secondary | ICD-10-CM

## 2019-08-12 DIAGNOSIS — Z03818 Encounter for observation for suspected exposure to other biological agents ruled out: Secondary | ICD-10-CM | POA: Diagnosis not present

## 2019-08-12 DIAGNOSIS — N400 Enlarged prostate without lower urinary tract symptoms: Secondary | ICD-10-CM | POA: Diagnosis present

## 2019-08-12 DIAGNOSIS — Z833 Family history of diabetes mellitus: Secondary | ICD-10-CM

## 2019-08-12 DIAGNOSIS — R42 Dizziness and giddiness: Secondary | ICD-10-CM

## 2019-08-12 DIAGNOSIS — R404 Transient alteration of awareness: Secondary | ICD-10-CM | POA: Diagnosis not present

## 2019-08-12 DIAGNOSIS — I951 Orthostatic hypotension: Secondary | ICD-10-CM | POA: Diagnosis not present

## 2019-08-12 DIAGNOSIS — K219 Gastro-esophageal reflux disease without esophagitis: Secondary | ICD-10-CM | POA: Diagnosis present

## 2019-08-12 DIAGNOSIS — R2689 Other abnormalities of gait and mobility: Secondary | ICD-10-CM | POA: Diagnosis present

## 2019-08-12 DIAGNOSIS — E785 Hyperlipidemia, unspecified: Secondary | ICD-10-CM | POA: Diagnosis not present

## 2019-08-12 DIAGNOSIS — H9190 Unspecified hearing loss, unspecified ear: Secondary | ICD-10-CM | POA: Diagnosis present

## 2019-08-12 DIAGNOSIS — W06XXXA Fall from bed, initial encounter: Secondary | ICD-10-CM | POA: Diagnosis not present

## 2019-08-12 DIAGNOSIS — I714 Abdominal aortic aneurysm, without rupture: Secondary | ICD-10-CM | POA: Diagnosis present

## 2019-08-12 DIAGNOSIS — E039 Hypothyroidism, unspecified: Secondary | ICD-10-CM | POA: Diagnosis present

## 2019-08-12 DIAGNOSIS — I4821 Permanent atrial fibrillation: Secondary | ICD-10-CM | POA: Diagnosis not present

## 2019-08-12 DIAGNOSIS — Z820 Family history of epilepsy and other diseases of the nervous system: Secondary | ICD-10-CM

## 2019-08-12 DIAGNOSIS — Z96641 Presence of right artificial hip joint: Secondary | ICD-10-CM | POA: Diagnosis present

## 2019-08-12 DIAGNOSIS — S0101XA Laceration without foreign body of scalp, initial encounter: Secondary | ICD-10-CM | POA: Diagnosis not present

## 2019-08-12 DIAGNOSIS — R4781 Slurred speech: Secondary | ICD-10-CM | POA: Diagnosis present

## 2019-08-12 DIAGNOSIS — E118 Type 2 diabetes mellitus with unspecified complications: Secondary | ICD-10-CM | POA: Diagnosis present

## 2019-08-12 DIAGNOSIS — Z8249 Family history of ischemic heart disease and other diseases of the circulatory system: Secondary | ICD-10-CM

## 2019-08-12 DIAGNOSIS — I63441 Cerebral infarction due to embolism of right cerebellar artery: Principal | ICD-10-CM | POA: Diagnosis present

## 2019-08-12 DIAGNOSIS — E669 Obesity, unspecified: Secondary | ICD-10-CM | POA: Diagnosis present

## 2019-08-12 DIAGNOSIS — F05 Delirium due to known physiological condition: Secondary | ICD-10-CM | POA: Diagnosis not present

## 2019-08-12 DIAGNOSIS — R29703 NIHSS score 3: Secondary | ICD-10-CM | POA: Diagnosis present

## 2019-08-12 DIAGNOSIS — Z7401 Bed confinement status: Secondary | ICD-10-CM | POA: Diagnosis not present

## 2019-08-12 DIAGNOSIS — N390 Urinary tract infection, site not specified: Secondary | ICD-10-CM | POA: Diagnosis not present

## 2019-08-12 DIAGNOSIS — S79911A Unspecified injury of right hip, initial encounter: Secondary | ICD-10-CM | POA: Diagnosis not present

## 2019-08-12 DIAGNOSIS — Z6824 Body mass index (BMI) 24.0-24.9, adult: Secondary | ICD-10-CM | POA: Diagnosis not present

## 2019-08-12 DIAGNOSIS — S299XXA Unspecified injury of thorax, initial encounter: Secondary | ICD-10-CM | POA: Diagnosis not present

## 2019-08-12 DIAGNOSIS — I639 Cerebral infarction, unspecified: Secondary | ICD-10-CM

## 2019-08-12 DIAGNOSIS — R2681 Unsteadiness on feet: Secondary | ICD-10-CM | POA: Diagnosis not present

## 2019-08-12 DIAGNOSIS — Z885 Allergy status to narcotic agent status: Secondary | ICD-10-CM

## 2019-08-12 DIAGNOSIS — M6281 Muscle weakness (generalized): Secondary | ICD-10-CM | POA: Diagnosis not present

## 2019-08-12 DIAGNOSIS — F0392 Unspecified dementia, unspecified severity, with psychotic disturbance: Secondary | ICD-10-CM | POA: Diagnosis present

## 2019-08-12 DIAGNOSIS — Z794 Long term (current) use of insulin: Secondary | ICD-10-CM

## 2019-08-12 DIAGNOSIS — I10 Essential (primary) hypertension: Secondary | ICD-10-CM | POA: Diagnosis present

## 2019-08-12 DIAGNOSIS — Z950 Presence of cardiac pacemaker: Secondary | ICD-10-CM

## 2019-08-12 DIAGNOSIS — R41841 Cognitive communication deficit: Secondary | ICD-10-CM | POA: Diagnosis not present

## 2019-08-12 DIAGNOSIS — I2510 Atherosclerotic heart disease of native coronary artery without angina pectoris: Secondary | ICD-10-CM | POA: Diagnosis present

## 2019-08-12 DIAGNOSIS — E119 Type 2 diabetes mellitus without complications: Secondary | ICD-10-CM | POA: Diagnosis not present

## 2019-08-12 DIAGNOSIS — T45516A Underdosing of anticoagulants, initial encounter: Secondary | ICD-10-CM | POA: Diagnosis present

## 2019-08-12 DIAGNOSIS — I63233 Cerebral infarction due to unspecified occlusion or stenosis of bilateral carotid arteries: Secondary | ICD-10-CM | POA: Diagnosis not present

## 2019-08-12 DIAGNOSIS — Z955 Presence of coronary angioplasty implant and graft: Secondary | ICD-10-CM

## 2019-08-12 DIAGNOSIS — E1151 Type 2 diabetes mellitus with diabetic peripheral angiopathy without gangrene: Secondary | ICD-10-CM | POA: Diagnosis not present

## 2019-08-12 DIAGNOSIS — Z960 Presence of urogenital implants: Secondary | ICD-10-CM | POA: Diagnosis present

## 2019-08-12 DIAGNOSIS — Z20822 Contact with and (suspected) exposure to covid-19: Secondary | ICD-10-CM | POA: Diagnosis present

## 2019-08-12 DIAGNOSIS — Z79899 Other long term (current) drug therapy: Secondary | ICD-10-CM

## 2019-08-12 DIAGNOSIS — I351 Nonrheumatic aortic (valve) insufficiency: Secondary | ICD-10-CM | POA: Diagnosis not present

## 2019-08-12 DIAGNOSIS — Z9181 History of falling: Secondary | ICD-10-CM

## 2019-08-12 DIAGNOSIS — Z91130 Patient's unintentional underdosing of medication regimen due to age-related debility: Secondary | ICD-10-CM

## 2019-08-12 DIAGNOSIS — D638 Anemia in other chronic diseases classified elsewhere: Secondary | ICD-10-CM | POA: Diagnosis present

## 2019-08-12 LAB — CBC
HCT: 43.6 % (ref 39.0–52.0)
Hemoglobin: 14.5 g/dL (ref 13.0–17.0)
MCH: 31.9 pg (ref 26.0–34.0)
MCHC: 33.3 g/dL (ref 30.0–36.0)
MCV: 96 fL (ref 80.0–100.0)
Platelets: 182 10*3/uL (ref 150–400)
RBC: 4.54 MIL/uL (ref 4.22–5.81)
RDW: 13.3 % (ref 11.5–15.5)
WBC: 10.7 10*3/uL — ABNORMAL HIGH (ref 4.0–10.5)
nRBC: 0 % (ref 0.0–0.2)

## 2019-08-12 LAB — BASIC METABOLIC PANEL
Anion gap: 10 (ref 5–15)
BUN: 18 mg/dL (ref 8–23)
CO2: 22 mmol/L (ref 22–32)
Calcium: 9 mg/dL (ref 8.9–10.3)
Chloride: 103 mmol/L (ref 98–111)
Creatinine, Ser: 1.23 mg/dL (ref 0.61–1.24)
GFR calc Af Amer: 59 mL/min — ABNORMAL LOW (ref >60)
GFR calc non Af Amer: 51 mL/min — ABNORMAL LOW (ref >60)
Glucose, Bld: 244 mg/dL — ABNORMAL HIGH (ref 70–99)
Potassium: 4.4 mmol/L (ref 3.5–5.1)
Sodium: 135 mmol/L (ref 135–145)

## 2019-08-12 LAB — URINALYSIS, ROUTINE W REFLEX MICROSCOPIC
Bilirubin Urine: NEGATIVE
Glucose, UA: 150 mg/dL — AB
Hgb urine dipstick: NEGATIVE
Ketones, ur: NEGATIVE mg/dL
Leukocytes,Ua: NEGATIVE
Nitrite: NEGATIVE
Protein, ur: NEGATIVE mg/dL
Specific Gravity, Urine: 1.024 (ref 1.005–1.030)
pH: 6 (ref 5.0–8.0)

## 2019-08-12 LAB — TSH: TSH: 0.367 u[IU]/mL (ref 0.350–4.500)

## 2019-08-12 LAB — GLUCOSE, CAPILLARY: Glucose-Capillary: 284 mg/dL — ABNORMAL HIGH (ref 70–99)

## 2019-08-12 LAB — CBG MONITORING, ED: Glucose-Capillary: 221 mg/dL — ABNORMAL HIGH (ref 70–99)

## 2019-08-12 LAB — PHOSPHORUS: Phosphorus: 2.6 mg/dL (ref 2.5–4.6)

## 2019-08-12 LAB — TROPONIN I (HIGH SENSITIVITY)
Troponin I (High Sensitivity): 9 ng/L (ref <18)
Troponin I (High Sensitivity): 10 ng/L (ref <18)

## 2019-08-12 LAB — MAGNESIUM: Magnesium: 2 mg/dL (ref 1.7–2.4)

## 2019-08-12 MED ORDER — INSULIN ASPART 100 UNIT/ML ~~LOC~~ SOLN
0.0000 [IU] | Freq: Three times a day (TID) | SUBCUTANEOUS | Status: DC
  Administered 2019-08-13: 3 [IU] via SUBCUTANEOUS
  Administered 2019-08-13 – 2019-08-14 (×2): 2 [IU] via SUBCUTANEOUS
  Administered 2019-08-14: 3 [IU] via SUBCUTANEOUS
  Administered 2019-08-15 (×2): 2 [IU] via SUBCUTANEOUS
  Administered 2019-08-16: 18:00:00 1 [IU] via SUBCUTANEOUS
  Administered 2019-08-16: 5 [IU] via SUBCUTANEOUS
  Administered 2019-08-17 (×2): 1 [IU] via SUBCUTANEOUS
  Administered 2019-08-17 – 2019-08-18 (×2): 3 [IU] via SUBCUTANEOUS
  Administered 2019-08-18 – 2019-08-19 (×3): 1 [IU] via SUBCUTANEOUS
  Administered 2019-08-19: 13:00:00 2 [IU] via SUBCUTANEOUS

## 2019-08-12 MED ORDER — STROKE: EARLY STAGES OF RECOVERY BOOK
Freq: Once | Status: AC
  Filled 2019-08-12: qty 1

## 2019-08-12 MED ORDER — MECLIZINE HCL 25 MG PO TABS
25.0000 mg | ORAL_TABLET | Freq: Once | ORAL | Status: AC
  Administered 2019-08-12: 25 mg via ORAL
  Filled 2019-08-12: qty 1

## 2019-08-12 MED ORDER — METOPROLOL TARTRATE 12.5 MG HALF TABLET
12.5000 mg | ORAL_TABLET | Freq: Two times a day (BID) | ORAL | Status: DC
  Administered 2019-08-12 – 2019-08-18 (×10): 12.5 mg via ORAL
  Filled 2019-08-12 (×12): qty 1

## 2019-08-12 MED ORDER — MIRTAZAPINE 15 MG PO TABS
15.0000 mg | ORAL_TABLET | Freq: Every day | ORAL | Status: DC
  Administered 2019-08-12 – 2019-08-18 (×7): 15 mg via ORAL
  Filled 2019-08-12 (×8): qty 1

## 2019-08-12 MED ORDER — ACETAMINOPHEN 325 MG PO TABS
650.0000 mg | ORAL_TABLET | ORAL | Status: DC | PRN
  Administered 2019-08-14 – 2019-08-15 (×2): 650 mg via ORAL
  Filled 2019-08-12 (×2): qty 2

## 2019-08-12 MED ORDER — ACETAMINOPHEN 650 MG RE SUPP: 650.0000 mg | RECTAL | Status: DC | PRN

## 2019-08-12 MED ORDER — SENNOSIDES-DOCUSATE SODIUM 8.6-50 MG PO TABS
1.0000 | ORAL_TABLET | Freq: Every evening | ORAL | Status: DC | PRN
  Administered 2019-08-15: 09:00:00 1 via ORAL
  Filled 2019-08-12: qty 1

## 2019-08-12 MED ORDER — FENOFIBRATE 54 MG PO TABS
54.0000 mg | ORAL_TABLET | Freq: Every day | ORAL | Status: DC
  Administered 2019-08-13 – 2019-08-19 (×7): 54 mg via ORAL
  Filled 2019-08-12 (×8): qty 1

## 2019-08-12 MED ORDER — LORATADINE 10 MG PO TABS
10.0000 mg | ORAL_TABLET | Freq: Every day | ORAL | Status: DC
  Administered 2019-08-12 – 2019-08-19 (×8): 10 mg via ORAL
  Filled 2019-08-12 (×8): qty 1

## 2019-08-12 MED ORDER — INSULIN ASPART 100 UNIT/ML ~~LOC~~ SOLN
0.0000 [IU] | Freq: Every day | SUBCUTANEOUS | Status: DC
  Administered 2019-08-13: 5 [IU] via SUBCUTANEOUS

## 2019-08-12 MED ORDER — PRAVASTATIN SODIUM 40 MG PO TABS
80.0000 mg | ORAL_TABLET | Freq: Every day | ORAL | Status: DC
  Administered 2019-08-12 – 2019-08-19 (×8): 80 mg via ORAL
  Filled 2019-08-12 (×8): qty 2

## 2019-08-12 MED ORDER — SODIUM CHLORIDE 0.9% FLUSH
3.0000 mL | Freq: Once | INTRAVENOUS | Status: AC
  Administered 2019-08-12: 3 mL via INTRAVENOUS

## 2019-08-12 MED ORDER — AZELASTINE HCL 0.1 % NA SOLN
2.0000 | Freq: Every evening | NASAL | Status: DC | PRN
  Filled 2019-08-12: qty 30

## 2019-08-12 MED ORDER — LEVOTHYROXINE SODIUM 100 MCG PO TABS
100.0000 ug | ORAL_TABLET | Freq: Every day | ORAL | Status: DC
  Administered 2019-08-13 – 2019-08-19 (×7): 100 ug via ORAL
  Filled 2019-08-12 (×7): qty 1

## 2019-08-12 MED ORDER — DIGOXIN 125 MCG PO TABS
0.1250 mg | ORAL_TABLET | ORAL | Status: DC
  Administered 2019-08-12 – 2019-08-18 (×4): 0.125 mg via ORAL
  Filled 2019-08-12 (×7): qty 1

## 2019-08-12 MED ORDER — PANTOPRAZOLE SODIUM 40 MG PO TBEC
40.0000 mg | DELAYED_RELEASE_TABLET | Freq: Every day | ORAL | Status: DC
  Administered 2019-08-13 – 2019-08-19 (×7): 40 mg via ORAL
  Filled 2019-08-12 (×7): qty 1

## 2019-08-12 MED ORDER — LINAGLIPTIN 5 MG PO TABS
5.0000 mg | ORAL_TABLET | Freq: Every day | ORAL | Status: DC
  Administered 2019-08-12 – 2019-08-16 (×5): 5 mg via ORAL
  Filled 2019-08-12 (×5): qty 1

## 2019-08-12 MED ORDER — ACETAMINOPHEN 160 MG/5ML PO SOLN: 650.0000 mg | ORAL | Status: DC | PRN

## 2019-08-12 MED ORDER — ESCITALOPRAM OXALATE 10 MG PO TABS
10.0000 mg | ORAL_TABLET | Freq: Every day | ORAL | Status: DC
  Administered 2019-08-12 – 2019-08-19 (×8): 10 mg via ORAL
  Filled 2019-08-12 (×8): qty 1

## 2019-08-12 NOTE — Telephone Encounter (Signed)
FYI

## 2019-08-12 NOTE — ED Notes (Signed)
Urine culture sent with urine to lab

## 2019-08-12 NOTE — Progress Notes (Signed)
PT has MR UNSAFE pacer. RN aware. Attempted secure chat w/ physician to relay this same message. Unable to scan pt.

## 2019-08-12 NOTE — Telephone Encounter (Signed)
Daughter called informing that patient fell yesterday twice and shes taking him to [place] .

## 2019-08-12 NOTE — ED Provider Notes (Addendum)
[facility] EMERGENCY DEPARTMENT Provider Note   CSN: [id number] Arrival date & time: 08/12/19  1308     History Chief Complaint  Patient presents with  . Dizziness    [name] is a 86 y.o. male.  HPI Patient was having some dizziness over the past few weeks.  He lives independently and takes care of himself.  He reports the dizziness does have a spinning quality to it.  Over the past 2 days is gotten very severe.  He reports that he fell twice today in the hallway trying to go to the bathroom.  He denies focal weakness numbness or tingling of his extremity reports he gets very off balance.  He denies he has pain from his falls.  His daughter who is present however notes that he was complaining of a headache yesterday.  Patient denies acute visual changes.  He is not endorsing double vision or loss of vision.  His daughter reports that he did vomit at some point in the past 24 hours.  No fever.  No cough.  Other than the dizziness, the patient reports he is feeling well.  He has not been suffering from chest pain or shortness of breath.    Past Medical History:  Diagnosis Date  . AAA (abdominal aortic aneurysm) ([place])   . Anemia due to chronic blood loss 11/25/2008   Recurrent over the years EGD and colonoscopy x 2 each 2004 and 2010 without cause    . Atrial fibrillation ([place])   . BPH (benign prostatic hyperplasia)    reports aprocedure (TURP) remotely in [place] KitchenNo futher f/u w/ urology  . CAD (coronary artery disease)   . Diabetes mellitus, type 2 ([place])   . Diverticulosis    left colon  . Erosive gastritis   . Fall 08/08/2016   OUTSIDE AT HOME FACIAL TRAUMA   . GERD (gastroesophageal reflux disease)   . Hyperlipidemia   . Hypertension   . Hypothyroidism   . Insomnia    transient  . Osteopenia    dexa 2-11  . Vitamin B12 deficiency     Patient Active Problem List   Diagnosis Date Noted  . Depression 03/12/2017  . Rotator cuff tear, right 08/18/2016  .  HLD (hyperlipidemia) 08/18/2016  . Nasal bone fracture 08/18/2016  . SAH (subarachnoid hemorrhage) ([place]) 08/08/2016  . PCP NOTES >>>>>>>>>>>>>>>>>>>> 09/28/2015  . Vasomotor rhinitis 09/14/2014  . Hiatal hernia 01/28/2013  . Erosive gastritis 01/28/2013  . BPH (benign prostatic hyperplasia) 06/07/2009  . Osteopenia 06/07/2009  . Anemia due to chronic blood loss 11/25/2008  . Vitamin B 12 deficiency 06/01/2008  . HTN (hypertension) 06/01/2008  . PACEMAKER, PERMANENT 06/01/2008  . GERD 03/31/2007  . Hypothyroidism 01/26/2007  . DM (diabetes mellitus) with complications ([place]) 30/01/2329  . Permanent atrial fibrillation ([place]) 01/26/2007  . Dyslipidemia 10/30/2006  . Coronary artery disease involving native coronary artery of native heart without angina pectoris 10/02/2006  . HIP REPLACEMENT, RIGHT, HX OF 02/04/2003    Past Surgical History:  Procedure Laterality Date  . CARDIAC CATHETERIZATION  01/31/06, 09/25/10, 09-2011  . CATARACT EXTRACTION     right  . CHOLECYSTECTOMY, LAPAROSCOPIC    . COLONOSCOPY     multiple  . CORONARY ARTERY BYPASS GRAFT     1999 stents in 2000  . ESOPHAGOGASTRODUODENOSCOPY     multiple  . GIVENS CAPSULE STUDY N/A 11/04/2012   Procedure: GIVENS CAPSULE STUDY;  Surgeon: Gatha Mayer, MD;  Location: [facility] ENDOSCOPY;  Service:  Endoscopy;  Laterality: N/A;  . inguinal herniorrhaphies     bilateral  . LEFT HEART CATHETERIZATION WITH CORONARY ANGIOGRAM N/A 10/04/2011   Procedure: LEFT HEART CATHETERIZATION WITH CORONARY ANGIOGRAM;  Surgeon: Burnell Blanks, MD;  Location: [facility] CATH LAB;  Service: Cardiovascular;  Laterality: N/A;  . pacemaker  [place], 2013   medtronic minix 8341  . PENILE PROSTHESIS IMPLANT  1992  . PERMANENT PACEMAKER GENERATOR CHANGE N/A 06/05/2011   Procedure: PERMANENT PACEMAKER GENERATOR CHANGE;  Surgeon: Evans Lance, MD;  Location: [facility] CATH LAB;  Service: Cardiovascular;  Laterality: N/A;  . PROSTATECTOMY     transurethral  .  right hip replacement    . TRANSTHORACIC ECHOCARDIOGRAM  12/2006       Family History  Problem Relation Age of Onset  . Alzheimer's disease Mother   . Heart failure Father   . CVA Father   . Coronary artery disease Brother        cabg  . Alzheimer's disease Sister   . Mental illness Sister   . Alzheimer's disease Sister   . Other Sister        lung problems  . Diabetes Other        several siblings  . Prostate cancer Neg Hx   . Colon cancer Neg Hx   . Esophageal cancer Neg Hx   . Stomach cancer Neg Hx   . Rectal cancer Neg Hx     Social History   Tobacco Use  . Smoking status: Never Smoker  . Smokeless tobacco: Never Used  Substance Use Topics  . Alcohol use: Yes    Alcohol/week: 1.0 - 2.0 standard drinks    Types: 1 - 2 Glasses of wine per week    Comment: socially   . Drug use: No    Home Medications Prior to Admission medications   Medication Sig Start Date End Date Taking? Authorizing Provider  apixaban (ELIQUIS) 5 MG TABS tablet Take 1 tablet (5 mg total) by mouth 2 (two) times daily. 01/04/19  Yes Paz, Alda Berthold, MD  azelastine (ASTELIN) 0.1 % nasal spray Place 2 sprays into both nostrils at bedtime as needed for rhinitis or allergies. Use in each nostril as directed 11/18/18  Yes Paz, Alda Berthold, MD  digoxin (LANOXIN) 0.125 MG tablet TAKE 1 TABLET BY MOUTH EVERY OTHER DAY Patient taking differently: Take 0.125 mg by mouth every other day.  04/07/19  Yes Evans Lance, MD  escitalopram (LEXAPRO) 10 MG tablet Take 1 tablet (10 mg total) by mouth daily. 05/14/18  Yes Paz, Alda Berthold, MD  fenofibrate micronized (LOFIBRA) 134 MG capsule Take 1 capsule (134 mg total) by mouth daily. 08/31/18  Yes Paz, Alda Berthold, MD  Insulin Glargine (LANTUS SOLOSTAR) 100 UNIT/ML Solostar Pen Inject 38 Units into the skin every morning. 02/11/18  Yes Renato Shin, MD  Insulin Pen Needle 31G X 6 MM MISC To use w/ Lantus Patient taking differently: 1 each by Other route daily. For Lantus administration.  11/06/17  Yes Renato Shin, MD  levothyroxine (SYNTHROID) 100 MCG tablet Take 1 tablet (100 mcg total) by mouth daily before breakfast. 01/04/19  Yes Paz, Alda Berthold, MD  loratadine (CLARITIN) 10 MG tablet Take 10 mg by mouth daily.   Yes [provider]  metoprolol tartrate (LOPRESSOR) 25 MG tablet Take 1 tablet (25 mg total) by mouth 2 (two) times daily. 04/07/19  Yes Paz, Alda Berthold, MD  mirtazapine (REMERON) 15 MG tablet Take 1 tablet (15 mg  total) by mouth at bedtime. 06/23/19  Yes Paz, Alda Berthold, MD  nitroGLYCERIN (NITROSTAT) 0.4 MG SL tablet Place 1 tablet (0.4 mg total) under the tongue every 5 (five) minutes x 3 doses as needed for chest pain. Then contact 911 or go to ER 12/09/16  Yes Colon Branch, MD  pantoprazole (PROTONIX) 40 MG tablet Take 1 tablet (40 mg total) by mouth daily before breakfast. 05/14/18  Yes Colon Branch, MD  pravastatin (PRAVACHOL) 40 MG tablet Take 2 tablets (80 mg total) by mouth daily. 10/07/18  Yes Paz, Alda Berthold, MD  sitaGLIPtin (JANUVIA) 100 MG tablet Take 1 tablet (100 mg total) by mouth daily. 05/14/18  Yes Paz, Alda Berthold, MD  ACCU-CHEK FASTCLIX LANCETS MISC Used to check blood sugars 2x daily. Patient not taking: Reported on 08/12/2019 11/10/17   Renato Shin, MD  Blood Glucose Monitoring Suppl (ACCU-CHEK GUIDE) w/Device KIT 1 Device by Does not apply route daily. Patient not taking: Reported on 08/12/2019 11/10/17   Renato Shin, MD  glucose blood (ACCU-CHEK GUIDE) test strip Used to check blood sugars 2x daily. Patient not taking: Reported on 08/12/2019 11/10/17   Renato Shin, MD  ipratropium (ATROVENT) 0.06 % nasal spray Place 2 sprays into both nostrils 3 (three) times daily. Patient not taking: Reported on 08/12/2019 12/04/18   Colon Branch, MD  omeprazole (PRILOSEC) 40 MG capsule Take 1 capsule (40 mg total) by mouth daily. Patient not taking: Reported on 08/12/2019 04/16/17   Colon Branch, MD  vitamin B-12 (CYANOCOBALAMIN) 1000 MCG tablet Take 1 tablet (1,000 mcg total) by mouth  daily. Patient not taking: Reported on 08/12/2019 10/02/15   Colon Branch, MD    Allergies    Ace inhibitors, Codeine phosphate, and Hydrochlorothiazide w-triamterene  Review of Systems   Review of Systems 10 Systems reviewed and are negative for acute change except as noted in the HPI.  Physical Exam Updated Vital Signs BP 123/63   Pulse 60   Temp 98.2 F (36.8 C) (Oral)   Resp (!) 26   SpO2 98%   Physical Exam Constitutional:      Comments: Patient is alert and nontoxic.  Excellent physical condition for age.  Speech is slightly hard to understand but content is normal and appropriate.  HENT:     Head: Normocephalic and atraumatic.     Ears:     Comments: Moderate amount of cerumen in the right ear canal.  TM is still visible without any apparent erythema or bulging.  Left ear canal clear except for a couple of hairs.  TM normal.  Posterior oropharynx widely patent.  Patient's tongue appears slightly dry with some plaque on it. Eyes:     Extraocular Movements: Extraocular movements intact.     Conjunctiva/sclera: Conjunctivae normal.     Pupils: Pupils are equal, round, and reactive to light.     Comments: Patient does have lateral nystagmus most pronounced to the right.  Cardiovascular:     Rate and Rhythm: Normal rate and regular rhythm.  Pulmonary:     Effort: Pulmonary effort is normal.     Breath sounds: Normal breath sounds.  Abdominal:     General: There is no distension.     Palpations: Abdomen is soft.     Tenderness: There is no abdominal tenderness. There is no guarding.  Musculoskeletal:        General: No swelling or tenderness. Normal range of motion.     Cervical back: Neck supple.  Right lower leg: No edema.     Left lower leg: No edema.  Skin:    General: Skin is warm and dry.  Neurological:     General: No focal deficit present.     Mental Status: He is oriented to person, place, and time.     Motor: No weakness.     Comments: Grip strength is  symmetric 5+.  Patient can elevate each lower extremity off the bed independently and hold it.  No pronator drift.  Pupils are symmetric.  Positive for right nystagmus.  Psychiatric:        Mood and Affect: Mood normal.     ED Results / Procedures / Treatments   Labs (all labs ordered are listed, but only abnormal results are displayed) Labs Reviewed  BASIC METABOLIC PANEL - Abnormal; Notable for the following components:      Result Value   Glucose, Bld 244 (*)    GFR calc non Af Amer 51 (*)    GFR calc Af Amer 59 (*)    All other components within normal limits  CBC - Abnormal; Notable for the following components:   WBC 10.7 (*)    All other components within normal limits  URINALYSIS, ROUTINE W REFLEX MICROSCOPIC - Abnormal; Notable for the following components:   Glucose, UA 150 (*)    All other components within normal limits  CBG MONITORING, ED - Abnormal; Notable for the following components:   Glucose-Capillary 221 (*)    All other components within normal limits  TSH  MAGNESIUM  PHOSPHORUS  TROPONIN I (HIGH SENSITIVITY)  TROPONIN I (HIGH SENSITIVITY)    EKG EKG Interpretation  Date/Time:  Thursday August 12 2019 13:10:09 EDT Ventricular Rate:  64 PR Interval:    QRS Duration: 188 QT Interval:  498 QTC Calculation: 513 R Axis:   -144 Text Interpretation: Ventricular-paced rhythm with occasional Premature ventricular complexes Abnormal ECG paced. Confirmed by Charlesetta Shanks [phone]) on 08/12/2019 3:03:52 PM   Radiology CT Head Wo Contrast  Result Date: 08/12/2019 CLINICAL DATA:  Dizziness for a few weeks which has worsened over the last few days. No known injury. EXAM: CT HEAD WITHOUT CONTRAST TECHNIQUE: Contiguous axial images were obtained from the base of the skull through the vertex without intravenous contrast. COMPARISON:  Head CT scan 09/07/2015. FINDINGS: Brain: There is a large area of hypoattenuation in the right cerebellar hemisphere extending to the  cortex most consistent with an infarct. Atrophy and extensive chronic microvascular ischemic change have progressed since the prior MRI. No hemorrhage, midline shift or abnormal extra-axial fluid collection. No hydrocephalus or pneumocephalus. Vascular: No hyperdense vessel is identified. Atherosclerosis is noted. Skull: Intact.  No focal lesion. Sinuses/Orbits: Status post cataract surgery.  Otherwise negative. Other: None. IMPRESSION: Larger of hypoattenuation in the right cerebellar hemisphere extending to the cortex is most consistent with an acute or early subacute infarct. Atrophy and extensive chronic microvascular ischemic change have markedly worsened since the prior head CT. Atherosclerosis. Electronically Signed   By: Inge Rise M.D.   On: 08/12/2019 16:45    Procedures Procedures (including critical care time) CRITICAL CARE Performed by: Charlesetta Shanks   Total critical care time: 30 minutes  Critical care time was exclusive of separately billable procedures and treating other patients.  Critical care was necessary to treat or prevent imminent or life-threatening deterioration.  Critical care was time spent personally by me on the following activities: development of treatment plan with patient and/or surrogate as well as  nursing, discussions with consultants, evaluation of patient's response to treatment, examination of patient, obtaining history from patient or surrogate, ordering and performing treatments and interventions, ordering and review of laboratory studies, ordering and review of radiographic studies, pulse oximetry and re-evaluation of patient's condition. Medications Ordered in ED Medications  sodium chloride flush (NS) 0.9 % injection 3 mL (3 mLs Intravenous Given 08/12/19 1511)  meclizine (ANTIVERT) tablet 25 mg (25 mg Oral Given 08/12/19 1511)    ED Course  I have reviewed the triage vital signs and the nursing notes.  Pertinent labs & imaging results that were  available during my care of the patient were reviewed by me and considered in my medical decision making (see chart for details).  Clinical Course as of Aug 18 2334  Thu Aug 12, 2019  1707 Consult:   [MP]    Clinical Course User Index [MP] Charlesetta Shanks, MD   MDM Rules/Calculators/A&P                     Patient has had some fairly debilitating vertigo develop over the past week to 2 days.  Today he took 2 falls.  CT shows early or subacute infarct.  Patient cannot do MRI based on his current pacemaker.  He is alert with good mental status.  Cognitive function is intact.  He follows commands appropriately.  There is no focal motor deficit.  Will admit for cerebellar infarct with severe vertigo and gait instability. Final Clinical Impression(s) / ED Diagnoses Final diagnoses:  Vertigo  Cerebrovascular accident (CVA), unspecified mechanism [facility])    Rx / [place] Orders ED Discharge Orders    None       Charlesetta Shanks, MD 08/12/19 1704    Charlesetta Shanks, MD 08/18/19 2336

## 2019-08-12 NOTE — Telephone Encounter (Signed)
Thank you, I will review the records when available

## 2019-08-12 NOTE — Telephone Encounter (Signed)
The pt daughter [name] states the pt is in the hospital and they need his ppm serial/model number. I gave it to her.

## 2019-08-12 NOTE — H&P (Signed)
History and Physical    [name]:[id number] DOB: 02/05/1926 DOA: 08/12/2019  I have briefly reviewed the patient's prior medical records in [place]  PCP: Colon Branch, MD  Patient coming from: Home  Chief Complaint: Progressive dizziness  HPI: [name] is a 86 y.o. male with medical history significant of permanent A. fib/complete heart block status post pacemaker, on chronic anticoagulation with Eliquis, CAD, hypertension, hyperlipidemia, hypothyroidism, insulin-dependent diabetes mellitus, who presents to the hospital with complaints of progressive dizziness over the last several days.  Patient was in his normal state of health up until _0 99 stents in 2000  . ESOPHAGOGASTRODUODENOSCOPY     multiple  . GIVENS CAPSULE STUDY N/A 11/04/2012   Procedure: GIVENS CAPSULE STUDY;  Surgeon: Gatha Mayer, MD;  Location: [facility] ENDOSCOPY;  Service: Endoscopy;  Laterality: N/A;  . inguinal herniorrhaphies     bilateral  . LEFT HEART CATHETERIZATION WITH CORONARY ANGIOGRAM N/A 10/04/2011   Procedure: LEFT HEART CATHETERIZATION WITH CORONARY ANGIOGRAM;  Surgeon: Burnell Blanks, MD;  Location: [facility] CATH LAB;  Service: Cardiovascular;  Laterality: N/A;  . pacemaker  1980, 1995, 2013  medtronic minix [id number]  . PENILE PROSTHESIS IMPLANT  1992  . PERMANENT PACEMAKER GENERATOR CHANGE N/A 06/05/2011   Procedure: PERMANENT PACEMAKER GENERATOR CHANGE;  Surgeon: Evans Lance, MD;  Location: [facility] CATH LAB;  Service: Cardiovascular;  Laterality: N/A;  . PROSTATECTOMY     transurethral  . right hip replacement    . TRANSTHORACIC ECHOCARDIOGRAM  12/2006     reports that he has never smoked. He has never used smokeless tobacco. He reports current alcohol use of about 1.0 - 2.0 standard drinks of alcohol per week. He reports that he  does not use drugs.  Allergies  Allergen Reactions  . Ace Inhibitors Cough  . Codeine Phosphate Nausea Only  . Hydrochlorothiazide W-Triamterene Other (See Comments)    CRAMPING    Family History  Problem Relation Age of Onset  . Alzheimer's disease Mother   . Heart failure Father   . CVA Father   . Coronary artery disease Brother        cabg  . Alzheimer's disease Sister   . Mental illness Sister   . Alzheimer's disease Sister   . Other Sister        lung problems  . Diabetes Other        several siblings  . Prostate cancer Neg Hx   . Colon cancer Neg Hx   . Esophageal cancer Neg Hx   . Stomach cancer Neg Hx   . Rectal cancer Neg Hx     Prior to Admission medications   Medication Sig Start Date End Date Taking? Authorizing Provider  apixaban (ELIQUIS) 5 MG TABS tablet Take 1 tablet (5 mg total) by mouth 2 (two) times daily. 01/04/19  Yes Paz, Alda Berthold, MD  azelastine (ASTELIN) 0.1 % nasal spray Place 2 sprays into both nostrils at bedtime as needed for rhinitis or allergies. Use in each nostril as directed 11/18/18  Yes Paz, Alda Berthold, MD  digoxin (LANOXIN) 0.125 MG tablet TAKE 1 TABLET BY MOUTH EVERY OTHER DAY Patient taking differently: Take 0.125 mg by mouth every other day.  04/07/19  Yes Evans Lance, MD  escitalopram (LEXAPRO) 10 MG tablet Take 1 tablet (10 mg total) by mouth daily. 05/14/18  Yes Paz, Alda Berthold, MD  fenofibrate micronized (LOFIBRA) 134 MG capsule Take 1 capsule (134 mg total) by mouth daily. 08/31/18  Yes Paz, Alda Berthold, MD  Insulin Glargine (LANTUS SOLOSTAR) 100 UNIT/ML Solostar Pen Inject 38 Units into the skin every morning. 02/11/18  Yes Renato Shin, MD  Insulin Pen Needle 31G X 6 MM MISC To use w/ Lantus Patient taking differently: 1 each by Other route daily. For Lantus administration. 11/06/17  Yes Renato Shin, MD  levothyroxine (SYNTHROID) 100 MCG tablet Take 1 tablet (100 mcg total) by mouth daily before breakfast. 01/04/19  Yes Paz, Alda Berthold, MD   loratadine (CLARITIN) 10 MG tablet Take 10 mg by mouth daily.   Yes [provider]  metoprolol tartrate (LOPRESSOR) 25 MG tablet Take 1 tablet (25 mg total) by mouth 2 (two) times daily. 04/07/19  Yes Paz, Alda Berthold, MD  mirtazapine (REMERON) 15 MG tablet Take 1 tablet (15 mg total) by mouth at bedtime. 06/23/19  Yes Paz, Alda Berthold, MD  nitroGLYCERIN (NITROSTAT) 0.4 MG SL tablet Place 1 tablet (0.4 mg total) under the tongue every 5 (five) minutes x 3 doses as needed for chest pain. Then contact 911 or go to ER 12/09/16  Yes Colon Branch, MD  pantoprazole (PROTONIX) 40  MG tablet Take 1 tablet (40 mg total) by mouth daily before breakfast. 05/14/18  Yes Paz, Alda Berthold, MD  pravastatin (PRAVACHOL) 40 MG tablet Take 2 tablets (80 mg total) by mouth daily. 10/07/18  Yes Paz, Alda Berthold, MD  sitaGLIPtin (JANUVIA) 100 MG tablet Take 1 tablet (100 mg total) by mouth daily. 05/14/18  Yes Paz, Alda Berthold, MD  ACCU-CHEK FASTCLIX LANCETS MISC Used to check blood sugars 2x daily. Patient not taking: Reported on 08/12/2019 11/10/17   Renato Shin, MD  Blood Glucose Monitoring Suppl (ACCU-CHEK GUIDE) w/Device KIT 1 Device by Does not apply route daily. Patient not taking: Reported on 08/12/2019 11/10/17   Renato Shin, MD  glucose blood (ACCU-CHEK GUIDE) test strip Used to check blood sugars 2x daily. Patient not taking: Reported on 08/12/2019 11/10/17   Renato Shin, MD  ipratropium (ATROVENT) 0.06 % nasal spray Place 2 sprays into both nostrils 3 (three) times daily. Patient not taking: Reported on 08/12/2019 12/04/18   Colon Branch, MD  omeprazole (PRILOSEC) 40 MG capsule Take 1 capsule (40 mg total) by mouth daily. Patient not taking: Reported on 08/12/2019 04/16/17   Colon Branch, MD  vitamin B-12 (CYANOCOBALAMIN) 1000 MCG tablet Take 1 tablet (1,000 mcg total) by mouth daily. Patient not taking: Reported on 08/12/2019 10/02/15   Colon Branch, MD    Physical Exam: Vitals:   08/12/19 1430 08/12/19 1445 08/12/19 1500 08/12/19 1515  BP:  (!) 102/56 (!) 122/58 (!) 125/59 123/63  Pulse: 60 60 62 60  Resp: (!) 23 (!) 23 (!) 26 (!) 26  Temp:      TempSrc:      SpO2: 98% 98% 97% 98%      Constitutional: NAD, calm, comfortable Eyes: PERRL, lids and conjunctivae normal ENMT: Mucous membranes are moist.  Neck: normal, supple Respiratory: clear to auscultation bilaterally, no wheezing, no crackles. Normal respiratory effort. No accessory muscle use.  Cardiovascular: Regular rate and rhythm. No extremity edema. 2+ pedal pulses.  Abdomen: no tenderness, no masses palpated. Bowel sounds positive.  Musculoskeletal: no clubbing / cyanosis. Normal muscle tone.  Skin: no rashes, lesions, ulcers. No induration Neurologic: CN 2-12 grossly intact / horizontal nystagmus present. Strength 5/5 in all 4.  Psychiatric: Normal judgment and insight. Alert and oriented x 3. Normal mood.   Labs on Admission: I have personally reviewed following labs and imaging studies  CBC: Recent Labs  Lab 08/12/19 1320  WBC 10.7*  HGB 14.5  HCT 43.6  MCV 96.0  PLT 643   Basic Metabolic Panel: Recent Labs  Lab 08/12/19 1320 08/12/19 1520  NA 135  --   K 4.4  --   CL 103  --   CO2 22  --   GLUCOSE 244*  --   BUN 18  --   CREATININE 1.23  --   CALCIUM 9.0  --   MG  --  2.0  PHOS  --  2.6   Liver Function Tests: No results for input(s): AST, ALT, ALKPHOS, BILITOT, PROT, ALBUMIN in the last 168 hours. Coagulation Profile: No results for input(s): INR, PROTIME in the last 168 hours. BNP (last 3 results) No results for input(s): PROBNP in the last 8760 hours. CBG: Recent Labs  Lab 08/12/19 1419  GLUCAP 221*   Thyroid Function Tests: Recent Labs    08/12/19 1520  TSH 0.367   Urine analysis:    Component Value Date/Time   COLORURINE YELLOW 08/12/2019 [place] 08/12/2019 1557  LABSPEC 1.024 08/12/2019 1557   PHURINE 6.0 08/12/2019 1557   GLUCOSEU 150 (A) 08/12/2019 1557   HGBUR NEGATIVE 08/12/2019 1557    [place] 08/12/2019 1557   KETONESUR NEGATIVE 08/12/2019 1557   PROTEINUR NEGATIVE 08/12/2019 1557   UROBILINOGEN 0.2 08/29/2012 1129   NITRITE NEGATIVE 08/12/2019 1557   LEUKOCYTESUR NEGATIVE 08/12/2019 1557     Radiological Exams on Admission: CT Head Wo Contrast  Result Date: 08/12/2019 CLINICAL DATA:  Dizziness for a few weeks which has worsened over the last few days. No known injury. EXAM: CT HEAD WITHOUT CONTRAST TECHNIQUE: Contiguous axial images were obtained from the base of the skull through the vertex without intravenous contrast. COMPARISON:  Head CT scan 09/07/2015. FINDINGS: Brain: There is a large area of hypoattenuation in the right cerebellar hemisphere extending to the cortex most consistent with an infarct. Atrophy and extensive chronic microvascular ischemic change have progressed since the prior MRI. No hemorrhage, midline shift or abnormal extra-axial fluid collection. No hydrocephalus or pneumocephalus. Vascular: No hyperdense vessel is identified. Atherosclerosis is noted. Skull: Intact.  No focal lesion. Sinuses/Orbits: Status post cataract surgery.  Otherwise negative. Other: None. IMPRESSION: Larger of hypoattenuation in the right cerebellar hemisphere extending to the cortex is most consistent with an acute or early subacute infarct. Atrophy and extensive chronic microvascular ischemic change have markedly worsened since the prior head CT. Atherosclerosis. Electronically Signed   By: Inge Rise M.D.   On: 08/12/2019 16:45    EKG: Independently reviewed.  Paced rhythm  Assessment/Plan  Principal Problem Acute CVA -most likely diagnosis as unable to obtain MRI, CT scan does show findings concerning for acute early subacute CVA in the right cerebellar hemisphere extending to the cortex -Neurology consulted, appreciate input -Obtain 2D echo, carotids, lipid panel, hemoglobin A1c, PT/OT  Active Problems Insulin-dependent type 2 diabetes mellitus  -Daughter tells me that he is not regularly checking his CBGs and she does not think he has been taking his insulin recently -Hold off Lantus and placed on sliding scale, obtain A1c as above  Permanent A. fib/complete heart block/pacemaker present -Monitor on telemetry, hold Eliquis until neurology sees patient, continue lower dose beta-blocker to allow degree of permissive hypertension  Hypothyroidism -Resume home Synthroid starting tomorrow morning  Hyperlipidemia -continue home statin, lipid panel in the morning  Essential hypertension -We will lower the dose of metoprolol to allow a degree of permissive hypertension as above   DVT prophylaxis: resume Eliquis after neuro evaluation  Code Status: Full code, confirmed with patient and daughter at bedside Family Communication: Daughter at bedside Disposition Plan: Admit to telemetry, likely will need home versus SNF Consults called: Neurology Obs/Inp: Observation  Marzetta Board, MD, PhD [facility]  Contact via [email or web address]  08/12/2019, 5:40 PM

## 2019-08-12 NOTE — ED Triage Notes (Signed)
Pt here with co of dizziness for the last few weeks and has worsened over the last few days. Pt has been unable to schedule an appt with his cardiologist. He has a pacemaker. Pt states he has had a few falls over the last few weeksat home but denies pain anywhere.

## 2019-08-13 ENCOUNTER — Observation Stay (HOSPITAL_COMMUNITY): Payer: Medicare HMO

## 2019-08-13 DIAGNOSIS — Z20822 Contact with and (suspected) exposure to covid-19: Secondary | ICD-10-CM | POA: Diagnosis present

## 2019-08-13 DIAGNOSIS — I4821 Permanent atrial fibrillation: Secondary | ICD-10-CM | POA: Diagnosis present

## 2019-08-13 DIAGNOSIS — I714 Abdominal aortic aneurysm, without rupture: Secondary | ICD-10-CM | POA: Diagnosis present

## 2019-08-13 DIAGNOSIS — R296 Repeated falls: Secondary | ICD-10-CM | POA: Diagnosis present

## 2019-08-13 DIAGNOSIS — H9190 Unspecified hearing loss, unspecified ear: Secondary | ICD-10-CM | POA: Diagnosis present

## 2019-08-13 DIAGNOSIS — I351 Nonrheumatic aortic (valve) insufficiency: Secondary | ICD-10-CM

## 2019-08-13 DIAGNOSIS — E669 Obesity, unspecified: Secondary | ICD-10-CM | POA: Diagnosis present

## 2019-08-13 DIAGNOSIS — R29703 NIHSS score 3: Secondary | ICD-10-CM | POA: Diagnosis present

## 2019-08-13 DIAGNOSIS — W06XXXA Fall from bed, initial encounter: Secondary | ICD-10-CM | POA: Diagnosis not present

## 2019-08-13 DIAGNOSIS — F0390 Unspecified dementia without behavioral disturbance: Secondary | ICD-10-CM | POA: Diagnosis present

## 2019-08-13 DIAGNOSIS — K219 Gastro-esophageal reflux disease without esophagitis: Secondary | ICD-10-CM | POA: Diagnosis present

## 2019-08-13 DIAGNOSIS — I2510 Atherosclerotic heart disease of native coronary artery without angina pectoris: Secondary | ICD-10-CM | POA: Diagnosis present

## 2019-08-13 DIAGNOSIS — E118 Type 2 diabetes mellitus with unspecified complications: Secondary | ICD-10-CM | POA: Diagnosis not present

## 2019-08-13 DIAGNOSIS — F05 Delirium due to known physiological condition: Secondary | ICD-10-CM | POA: Diagnosis present

## 2019-08-13 DIAGNOSIS — R2689 Other abnormalities of gait and mobility: Secondary | ICD-10-CM | POA: Diagnosis present

## 2019-08-13 DIAGNOSIS — S79911A Unspecified injury of right hip, initial encounter: Secondary | ICD-10-CM | POA: Diagnosis not present

## 2019-08-13 DIAGNOSIS — E785 Hyperlipidemia, unspecified: Secondary | ICD-10-CM | POA: Diagnosis present

## 2019-08-13 DIAGNOSIS — I442 Atrioventricular block, complete: Secondary | ICD-10-CM | POA: Diagnosis present

## 2019-08-13 DIAGNOSIS — I63233 Cerebral infarction due to unspecified occlusion or stenosis of bilateral carotid arteries: Secondary | ICD-10-CM | POA: Diagnosis not present

## 2019-08-13 DIAGNOSIS — E039 Hypothyroidism, unspecified: Secondary | ICD-10-CM | POA: Diagnosis present

## 2019-08-13 DIAGNOSIS — Y92230 Patient room in hospital as the place of occurrence of the external cause: Secondary | ICD-10-CM | POA: Diagnosis not present

## 2019-08-13 DIAGNOSIS — E1151 Type 2 diabetes mellitus with diabetic peripheral angiopathy without gangrene: Secondary | ICD-10-CM | POA: Diagnosis present

## 2019-08-13 DIAGNOSIS — Z951 Presence of aortocoronary bypass graft: Secondary | ICD-10-CM | POA: Diagnosis not present

## 2019-08-13 DIAGNOSIS — Z6824 Body mass index (BMI) 24.0-24.9, adult: Secondary | ICD-10-CM | POA: Diagnosis not present

## 2019-08-13 DIAGNOSIS — I63111 Cerebral infarction due to embolism of right vertebral artery: Secondary | ICD-10-CM

## 2019-08-13 DIAGNOSIS — I639 Cerebral infarction, unspecified: Secondary | ICD-10-CM | POA: Diagnosis present

## 2019-08-13 DIAGNOSIS — I10 Essential (primary) hypertension: Secondary | ICD-10-CM | POA: Diagnosis present

## 2019-08-13 DIAGNOSIS — N390 Urinary tract infection, site not specified: Secondary | ICD-10-CM | POA: Diagnosis not present

## 2019-08-13 DIAGNOSIS — N400 Enlarged prostate without lower urinary tract symptoms: Secondary | ICD-10-CM | POA: Diagnosis present

## 2019-08-13 DIAGNOSIS — R4781 Slurred speech: Secondary | ICD-10-CM | POA: Diagnosis present

## 2019-08-13 DIAGNOSIS — I63441 Cerebral infarction due to embolism of right cerebellar artery: Secondary | ICD-10-CM | POA: Diagnosis present

## 2019-08-13 DIAGNOSIS — S0990XA Unspecified injury of head, initial encounter: Secondary | ICD-10-CM | POA: Diagnosis not present

## 2019-08-13 DIAGNOSIS — S299XXA Unspecified injury of thorax, initial encounter: Secondary | ICD-10-CM | POA: Diagnosis not present

## 2019-08-13 DIAGNOSIS — M25551 Pain in right hip: Secondary | ICD-10-CM | POA: Diagnosis not present

## 2019-08-13 DIAGNOSIS — S0101XA Laceration without foreign body of scalp, initial encounter: Secondary | ICD-10-CM | POA: Diagnosis not present

## 2019-08-13 LAB — HEMOGLOBIN A1C
Hgb A1c MFr Bld: 7.6 % — ABNORMAL HIGH (ref 4.8–5.6)
Mean Plasma Glucose: 171.42 mg/dL

## 2019-08-13 LAB — GLUCOSE, CAPILLARY
Glucose-Capillary: 104 mg/dL — ABNORMAL HIGH (ref 70–99)
Glucose-Capillary: 201 mg/dL — ABNORMAL HIGH (ref 70–99)
Glucose-Capillary: 177 mg/dL — ABNORMAL HIGH (ref 70–99)
Glucose-Capillary: 155 mg/dL — ABNORMAL HIGH (ref 70–99)

## 2019-08-13 LAB — LIPID PANEL
Cholesterol: 136 mg/dL (ref 0–200)
HDL: 34 mg/dL — ABNORMAL LOW (ref >40)
LDL Cholesterol: 84 mg/dL (ref 0–99)
Total CHOL/HDL Ratio: 4 RATIO
Triglycerides: 91 mg/dL (ref <150)
VLDL: 18 mg/dL (ref 0–40)

## 2019-08-13 LAB — SARS CORONAVIRUS 2 (TAT 6-24 HRS): SARS Coronavirus 2: NEGATIVE

## 2019-08-13 LAB — ECHOCARDIOGRAM COMPLETE

## 2019-08-13 MED ORDER — APIXABAN 5 MG PO TABS
5.0000 mg | ORAL_TABLET | Freq: Two times a day (BID) | ORAL | Status: DC
  Administered 2019-08-13 – 2019-08-19 (×12): 5 mg via ORAL
  Filled 2019-08-13 (×12): qty 1

## 2019-08-13 MED ORDER — IOHEXOL 350 MG/ML SOLN
75.0000 mL | Freq: Once | INTRAVENOUS | Status: AC | PRN
  Administered 2019-08-13: 75 mL via INTRAVENOUS

## 2019-08-13 MED ORDER — ASPIRIN EC 325 MG PO TBEC
325.0000 mg | DELAYED_RELEASE_TABLET | Freq: Every day | ORAL | Status: DC
  Administered 2019-08-13: 09:00:00 325 mg via ORAL
  Filled 2019-08-13: qty 1

## 2019-08-13 NOTE — Progress Notes (Signed)
PROGRESS NOTE  [name] [id number] DOB: 09-11-1925 DOA: 08/12/2019 PCP: Nicholas Branch, MD  HPI/Recap of past 67 hours: 86 year old male with past medical history of permanent A. fib and complete heart block status post pacemaker on chronic anticoagulation with Eliquis plus hypertension, hypothyroidism and diabetes mellitus who presented to the emergency room with complaints of progressive dizziness over the last few days.  Patient has also had a number of falls.  Patient's daughters clarified that he lives at home alone and he is actually not been very compliant with his insulin and possibly other medications.  Patient stated he was in his usual state of health up and about 4 days ago he started having mild headaches.  In the emergency room, he was noted to have some slurred speech and a CT scan of the head noted concern for hypoattenuation in the right cerebellar hemisphere extending to the cortex consistent with acute to early subacute infarct.  Unable to get MRI because of pacemaker.  Neurology consulted.  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  I suspect that his CVA occurred due to noncompliance with his medications.  Patient apparently has been getting confused by this.  Despite his advanced age, he lives alone and still drives.  Symptoms are still persistent with slurred speech, diminished attention impaired gait and some dizziness.  Awaiting for speech to see.  Seen by PT and OT who are recommending inpatient rehab.  Would continue Eliquis at this time.  Discussed with daughters and he likely will need a long-term alternative living arrangement Active Problems:   Hypothyroidism: Continue Synthroid    DM (diabetes mellitus) with complications [facility]): Patient has not been taking his diabetic medications properly.  Follow with insulin sliding scale here.  HTN (hypertension): Allowing for permissive  hypertension.    Permanent atrial fibrillation ([place]): Status post pacemaker    BPH (benign prostatic hyperplasia)  Hyperlipidemia: Patient would benefit from statin to decrease LDL to goal below 70  Code Status: Full code  Family Communication: Daughter's at the bedside  Disposition Plan: Awaiting inpatient rehab evaluation   Consultants:  Inpatient rehab  Neurology  Procedures:  Echocardiogram done 4/2 notes preserved ejection fraction, no evidence of valvular dysfunction, indeterminate diastolic function  Antimicrobials:  None  DVT prophylaxis: SCDs   Objective: Vitals:   08/13/19 1138 08/13/19 1634  BP: 122/64 133/66  Pulse: (!) 59 64  Resp: 18 18  Temp: 98.1 F (36.7 C) 98 F (36.7 C)  SpO2: 97% 98%    Intake/Output Summary (Last 24 hours) at 08/13/2019 1814 Last data filed at 08/13/2019 1300 Gross per 24 hour  Intake 560 ml  Output --  Net 560 ml   There were no vitals filed for this visit. There is no height or weight on file to calculate BMI.  Exam:   General: Alert and oriented x2, no acute distress  HEENT: Cephalic and atraumatic, mucous membranes are slightly dry  Neck is supple, no JVD  Cardiovascular: Regular rate and rhythm, S1-S2  Respiratory: Clear to auscultation bilaterally  Abdomen: Soft, nontender, nondistended, positive bowel sounds  Musculoskeletal: No clubbing or cyanosis or edema  Skin: No skin breaks, tears or lesions  Psychiatry: Appropriate, but sometimes easily distracted  Neuro: Strength appears symmetric, but globally weak.  Did not test balance to get out of bed to risk for fall   Data Reviewed: CBC: Recent Labs  Lab 08/12/19 1320  WBC  10.7*  HGB 14.5  HCT 43.6  MCV 96.0  PLT [id number]   Basic Metabolic Panel: Recent Labs  Lab 08/12/19 1320 08/12/19 1520  NA 135  --   K 4.4  --   CL 103  --   CO2 22  --   GLUCOSE 244*  --   BUN 18  --   CREATININE 1.23  --   CALCIUM 9.0  --   MG  --  2.0  PHOS   --  2.6   GFR: CrCl cannot be calculated (Unknown ideal weight.). Liver Function Tests: No results for input(s): AST, ALT, ALKPHOS, BILITOT, PROT, ALBUMIN in the last 168 hours. No results for input(s): LIPASE, AMYLASE in the last 168 hours. No results for input(s): AMMONIA in the last 168 hours. Coagulation Profile: No results for input(s): INR, PROTIME in the last 168 hours. Cardiac Enzymes: No results for input(s): CKTOTAL, CKMB, CKMBINDEX, TROPONINI in the last 168 hours. BNP (last 3 results) No results for input(s): PROBNP in the last 8760 hours. HbA1C: Recent Labs    08/12/19 0913  HGBA1C 7.6*   CBG: Recent Labs  Lab 08/12/19 1419 08/12/19 2332 08/13/19 0656 08/13/19 1137 08/13/19 1706  GLUCAP 221* 284* 104* 201* 177*   Lipid Profile: Recent Labs    08/13/19 0600  CHOL 136  HDL 34*  LDLCALC 84  TRIG 91  CHOLHDL 4.0   Thyroid Function Tests: Recent Labs    08/12/19 1520  TSH 0.367   Anemia Panel: No results for input(s): VITAMINB12, FOLATE, FERRITIN, TIBC, IRON, RETICCTPCT in the last 72 hours. Urine analysis:    Component Value Date/Time   COLORURINE YELLOW 08/12/2019 1557   APPEARANCEUR CLEAR 08/12/2019 1557   LABSPEC 1.024 08/12/2019 1557   PHURINE 6.0 08/12/2019 1557   GLUCOSEU 150 (A) 08/12/2019 1557   HGBUR NEGATIVE 08/12/2019 1557   BILIRUBINUR NEGATIVE 08/12/2019 1557   KETONESUR NEGATIVE 08/12/2019 1557   PROTEINUR NEGATIVE 08/12/2019 1557   UROBILINOGEN 0.2 08/29/2012 1129   NITRITE NEGATIVE 08/12/2019 1557   LEUKOCYTESUR NEGATIVE 08/12/2019 1557   Sepsis Labs: @LABRCNTIP (procalcitonin:4,lacticidven:4)  ) Recent Results (from the past 240 hour(s))  SARS CORONAVIRUS 2 (TAT 6-24 HRS) Nasopharyngeal Nasopharyngeal Swab     Status: None   Collection Time: 08/12/19  6:55 PM   Specimen: Nasopharyngeal Swab  Result Value Ref Range Status   SARS Coronavirus 2 NEGATIVE NEGATIVE Final    Comment: (NOTE) SARS-CoV-2 target nucleic acids  are NOT DETECTED. The SARS-CoV-2 RNA is generally detectable in upper and lower respiratory specimens during the acute phase of infection. Negative results do not preclude SARS-CoV-2 infection, do not rule out co-infections with other pathogens, and should not be used as the sole basis for treatment or other patient management decisions. Negative results must be combined with clinical observations, patient history, and epidemiological information. The expected result is Negative. Fact Sheet for Patients: SugarRoll.be Fact Sheet for Healthcare Providers: https://www.woods-mathews.com/ This test is not yet approved or cleared by the [place] FDA and  has been authorized for detection and/or diagnosis of SARS-CoV-2 by FDA under an Emergency Use Authorization (EUA). This EUA will remain  in effect (meaning this test can be used) for the duration of the COVID-19 declaration under Section 56 4(b)(1) of the Act, 21 U.S.C. section 360bbb-3(b)(1), unless the authorization is terminated or revoked sooner. Performed at [facility], [place] [address] [place], [place] [address]       Studies: CT ANGIO HEAD W OR WO CONTRAST  Result Date: 08/13/2019 CLINICAL DATA:  Stroke follow-up EXAM: CT ANGIOGRAPHY HEAD AND NECK TECHNIQUE: Multidetector CT imaging of the head and neck was performed using the standard protocol during bolus administration of intravenous contrast. Multiplanar CT image reconstructions and MIPs were obtained to evaluate the vascular anatomy. Carotid stenosis measurements (when applicable) are obtained utilizing NASCET criteria, using the distal internal carotid diameter as the denominator. CONTRAST:  1mL OMNIPAQUE IOHEXOL 350 MG/ML SOLN COMPARISON:  Head CT from yesterday FINDINGS: CTA NECK FINDINGS Aortic arch: Multifocal atherosclerotic plaque. No aneurysm or dissection. Two vessel arch branching. Partially covered CABG including the  LIMA. Right carotid system: Calcified plaque at the bifurcation primarily affecting the ECA origin. No flow limiting stenosis in the common or internal carotid arteries. No ulceration. Subtle but convincing mid ICA beading. Left carotid system: Calcified plaque at the bifurcation primarily on the posterior wall. No flow limiting stenosis or ulceration. There is distal ICA tortuosity with looping. Mild beaded appearance to the mid left ICA. Vertebral arteries: Proximal subclavian atherosclerosis or ulceration without flow limiting stenosis.Vertebral arteries are smooth and widely patent. Skeleton: Advanced cervical spine degeneration with multilevel ankylosis. Other neck: No evidence of mass or adenopathy. Deformity of the thyroid cartilage and larynx which appears posttraumatic. Upper chest: Negative Review of the MIP images confirms the above findings CTA HEAD FINDINGS Anterior circulation: Atherosclerotic plaque on the carotid siphons without focal or high-grade stenosis. 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 Extensive atheromatous irregularity of medium size vessels. Negative for aneurysm or vascular malformation. Posterior circulation: Slight dominance of the right vertebral artery. Both picas are symmetrically patent. Basilar is sufficiently patent. Fetal type right PCA flow. Moderate atheromatous irregularity of posterior cerebral arteries. Negative for aneurysm Venous sinuses: Unremarkable as permitted by contrast timing Anatomic variants: None significant. A noncontrast phase was not acquired. There is a known right PICA territory infarct with fourth ventricular narrowing inferiorly. Ventricular volume appears stable Review of the MIP images confirms the above findings IMPRESSION: 1. Acute right PICA territory infarct with lower fourth ventricular narrowing but no hydrocephalus currently. 2. No acute vascular finding. There is symmetric flow in both picas and no underlying  atheromatous flow limiting stenosis or ulceration. 3. Extensive intracranial atherosclerosis affecting medium size vessels. 4. Mild fibromuscular dysplasia seen at the cervical ICAs. Electronically Signed   By: Monte Fantasia M.D.   On: 08/13/2019 07:16   CT ANGIO NECK W OR WO CONTRAST  Result Date: 08/13/2019 CLINICAL DATA:  Stroke follow-up EXAM: CT ANGIOGRAPHY HEAD AND NECK TECHNIQUE: Multidetector CT imaging of the head and neck was performed using the standard protocol during bolus administration of intravenous contrast. Multiplanar CT image reconstructions and MIPs were obtained to evaluate the vascular anatomy. Carotid stenosis measurements (when applicable) are obtained utilizing NASCET criteria, using the distal internal carotid diameter as the denominator. CONTRAST:  50mL OMNIPAQUE IOHEXOL 350 MG/ML SOLN COMPARISON:  Head CT from yesterday FINDINGS: CTA NECK FINDINGS Aortic arch: Multifocal atherosclerotic plaque. No aneurysm or dissection. Two vessel arch branching. Partially covered CABG including the LIMA. Right carotid system: Calcified plaque at the bifurcation primarily affecting the ECA origin. No flow limiting stenosis in the common or internal carotid arteries. No ulceration. Subtle but convincing mid ICA beading. Left carotid system: Calcified plaque at the bifurcation primarily on the posterior wall. No flow limiting stenosis or ulceration. There is distal ICA tortuosity with looping. Mild beaded appearance to the mid left ICA. Vertebral arteries: Proximal subclavian atherosclerosis or ulceration  without flow limiting stenosis.Vertebral arteries are smooth and widely patent. Skeleton: Advanced cervical spine degeneration with multilevel ankylosis. Other neck: No evidence of mass or adenopathy. Deformity of the thyroid cartilage and larynx which appears posttraumatic. Upper chest: Negative Review of the MIP images confirms the above findings CTA HEAD FINDINGS Anterior circulation:  Atherosclerotic plaque on the carotid siphons without focal or high-grade stenosis. 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 Extensive atheromatous irregularity of medium size vessels. Negative for aneurysm or vascular malformation. Posterior circulation: Slight dominance of the right vertebral artery. Both picas are symmetrically patent. Basilar is sufficiently patent. Fetal type right PCA flow. Moderate atheromatous irregularity of posterior cerebral arteries. Negative for aneurysm Venous sinuses: Unremarkable as permitted by contrast timing Anatomic variants: None significant. A noncontrast phase was not acquired. There is a known right PICA territory infarct with fourth ventricular narrowing inferiorly. Ventricular volume appears stable Review of the MIP images confirms the above findings IMPRESSION: 1. Acute right PICA territory infarct with lower fourth ventricular narrowing but no hydrocephalus currently. 2. No acute vascular finding. There is symmetric flow in both picas and no underlying atheromatous flow limiting stenosis or ulceration. 3. Extensive intracranial atherosclerosis affecting medium size vessels. 4. Mild fibromuscular dysplasia seen at the cervical ICAs. Electronically Signed   By: Monte Fantasia M.D.   On: 08/13/2019 07:16   ECHOCARDIOGRAM COMPLETE  Result Date: 08/13/2019    ECHOCARDIOGRAM REPORT   Patient Name:   [name] [facility] Date of Exam: 08/13/2019 Medical Rec #:  [id number]   Height:       67.0 in Accession #:    [id number]  Weight:       148.0 lb Date of Birth:  [birth year]  BSA:          1.779 m Patient Age:    8 years    BP:           129/64 mmHg Patient Gender: M           HR:           59 bpm. Exam Location:  Inpatient Procedure: 2D Echo Indications:    Stroke 434.91 / I163.9  History:        Patient has prior history of Echocardiogram examinations, most                 recent 08/09/2016. Pacemaker, Arrythmias:Atrial Fibrillation;                 Risk  Factors:Diabetes, Hypertension and Dyslipidemia.  Sonographer:    Vikki Ports Turrentine Referring Phys: [place]  Sonographer Comments: Suboptimal subcostal window. Image acquisition challenging due to patient body habitus and Image acquisition challenging due to uncooperative patient. IMPRESSIONS  1. Normal LV systolic function; moderate LVH; mild AI; mild LAE.  2. Left ventricular ejection fraction, by estimation, is 60 to 65%. The left ventricle has normal function. The left ventricle has no regional wall motion abnormalities. There is moderate left ventricular hypertrophy. Left ventricular diastolic parameters are indeterminate.  3. Right ventricular systolic function is normal. The right ventricular size is normal.  4. Left atrial size was mildly dilated.  5. The mitral valve is normal in structure. Trivial mitral valve regurgitation. No evidence of mitral stenosis.  6. The aortic valve is tricuspid. Aortic valve regurgitation is mild. Mild to moderate aortic valve sclerosis/calcification is present, without any evidence of aortic stenosis. FINDINGS  Left Ventricle: Left ventricular ejection fraction, by estimation, is 60  to 65%. The left ventricle has normal function. The left ventricle has no regional wall motion abnormalities. The left ventricular internal cavity size was normal in size. There is  moderate left ventricular hypertrophy. Left ventricular diastolic parameters are indeterminate. Right Ventricle: The right ventricular size is normal. Right ventricular systolic function is normal. Left Atrium: Left atrial size was mildly dilated. Right Atrium: Right atrial size was normal in size. Pericardium: There is no evidence of pericardial effusion. Mitral Valve: The mitral valve is normal in structure. Normal mobility of the mitral valve leaflets. Mild mitral annular calcification. Trivial mitral valve regurgitation. No evidence of mitral valve stenosis. Tricuspid Valve: The tricuspid valve is  normal in structure. Tricuspid valve regurgitation is trivial. No evidence of tricuspid stenosis. Aortic Valve: The aortic valve is tricuspid. Aortic valve regurgitation is mild. Mild to moderate aortic valve sclerosis/calcification is present, without any evidence of aortic stenosis. Aortic valve mean gradient measures 4.0 mmHg. Aortic valve peak gradient measures 7.8 mmHg. Aortic valve area, by VTI measures 2.01 cm. Pulmonic Valve: The pulmonic valve was not well visualized. Pulmonic valve regurgitation is not visualized. No evidence of pulmonic stenosis. Aorta: The aortic root is normal in size and structure. Venous: The inferior vena cava was not well visualized.  Additional Comments: Normal LV systolic function; moderate LVH; mild AI; mild LAE. A pacer wire is visualized.  LEFT VENTRICLE PLAX 2D LVIDd:         4.70 cm LVIDs:         3.20 cm LV PW:         1.40 cm LV IVS:        1.40 cm LVOT diam:     2.00 cm LV SV:         47 LV SV Index:   27 LVOT Area:     3.14 cm  RIGHT VENTRICLE TAPSE (M-mode): 1.2 cm LEFT ATRIUM             Index LA diam:        5.50 cm 3.09 cm/m LA Vol (A2C):   61.6 ml 34.62 ml/m LA Vol (A4C):   73.8 ml 41.48 ml/m LA Biplane Vol: 69.3 ml 38.95 ml/m  AORTIC VALVE AV Area (Vmax):    1.68 cm AV Area (Vmean):   1.83 cm AV Area (VTI):     2.01 cm AV Vmax:           140.00 cm/s AV Vmean:          90.000 cm/s AV VTI:            0.236 m AV Peak Grad:      7.8 mmHg AV Mean Grad:      4.0 mmHg LVOT Vmax:         74.80 cm/s LVOT Vmean:        52.500 cm/s LVOT VTI:          0.151 m LVOT/AV VTI ratio: 0.64  AORTA Ao Root diam: 3.20 cm MITRAL VALVE               TRICUSPID VALVE MV Area (PHT): 3.50 cm    TR Peak grad:   15.1 mmHg MV Decel Time: 217 msec    TR Vmax:        194.00 cm/s MV E velocity: 74.80 cm/s MV A velocity: 63.30 cm/s  SHUNTS MV E/A ratio:  1.18        Systemic VTI:  0.15 m  Systemic Diam: 2.00 cm Kirk Ruths MD Electronically signed by Kirk Ruths MD Signature Date/Time: 08/13/2019/1:28:37 PM    Final     Scheduled Meds: . apixaban  5 mg Oral BID  . digoxin  0.125 mg Oral QODAY  . escitalopram  10 mg Oral Daily  . fenofibrate  54 mg Oral Daily  . insulin aspart  0-5 Units Subcutaneous QHS  . insulin aspart  0-9 Units Subcutaneous TID WC  . levothyroxine  100 mcg Oral QAC breakfast  . linagliptin  5 mg Oral Daily  . loratadine  10 mg Oral Daily  . metoprolol tartrate  12.5 mg Oral BID  . mirtazapine  15 mg Oral QHS  . pantoprazole  40 mg Oral QAC breakfast  . pravastatin  80 mg Oral Daily    Continuous Infusions:   LOS: 0 days     Annita Brod, MD [facility]   08/13/2019, 6:14 PM

## 2019-08-13 NOTE — Evaluation (Signed)
Occupational Therapy Evaluation Patient Details Name: [name] MRN: [id number] DOB: [birth year] Today's Date: 08/13/2019    History of Present Illness 86 yo male s/p fall with dizziness MRI fairly large cerebellar infarct. PMH afib on eliquis, AAA, CAD, DM, Diverticulosis, Fall 2018 with facial trauma, HTN,    Clinical Impression   PT admitted with cerebellar infarct. Pt currently with functional limitiations due to the deficits listed below (see OT problem list). Pt demonstrates balance deficits that affect all adls at this time. Recommending 7-10 days on CIR for return to independent level for d/c.  Pt will benefit from skilled OT to increase their independence and safety with adls and balance to allow discharge CIR.     Follow Up Recommendations  CIR    Equipment Recommendations  3 in 1 bedside commode    Recommendations for Other Services Rehab consult     Precautions / Restrictions Precautions Precautions: Fall      Mobility Bed Mobility Overal bed mobility: Needs Assistance Bed Mobility: Supine to Sit;Sit to Supine     Supine to sit: Min assist Sit to supine: Min assist   General bed mobility comments: pt requires (A) to progress to EOB pullin with bil UE on bed sheets. pt requires (A) to bring bil LE onto bed surface  Transfers Overall transfer level: Needs assistance Equipment used: Rolling walker (2 wheeled) Transfers: Sit to/from Stand Sit to Stand: Min assist         General transfer comment: cues for safety with RW and to remain in RW     Balance Overall balance assessment: Mild deficits observed, not formally tested                                         ADL either performed or assessed with clinical judgement   ADL Overall ADL's : Needs assistance/impaired Eating/Feeding: Supervision/ safety;Sitting   Grooming: Supervision/safety;Sitting   Upper Body Bathing: Min guard;Sitting   Lower Body Bathing: Moderate  assistance;Sit to/from stand Lower Body Bathing Details (indicate cue type and reason): able to wash min (A) sitting Upper Body Dressing : Min guard;Sitting   Lower Body Dressing: Moderate assistance;Sit to/from stand Lower Body Dressing Details (indicate cue type and reason): attempting to transfer with pants falling. pt says my pants are off and continues to attempt transfer Toilet Transfer: Minimal assistance;BSC;RW;Stand-pivot           Functional mobility during ADLs: Minimal assistance;Rolling walker General ADL Comments: pt needed safety cues throughout session. pt remained seated with cues wihtout need to have it repeated.      Vision   Vision Assessment?: Yes Additional Comments: pt noted to have beating nystagmus with upward gaze. pt able to track in all quadrants denies diplopia and L eye convergence more than R eye      Perception     Praxis      Pertinent Vitals/Pain Pain Assessment: No/denies pain     Hand Dominance Right   Extremity/Trunk Assessment Upper Extremity Assessment Upper Extremity Assessment: Generalized weakness   Lower Extremity Assessment Lower Extremity Assessment: Defer to PT evaluation   Cervical / Trunk Assessment Cervical / Trunk Assessment: Normal   Communication Communication Communication: No difficulties   Cognition Arousal/Alertness: Awake/alert Behavior During Therapy: WFL for tasks assessed/performed Overall Cognitive Status: Impaired/Different from baseline Area of Impairment: Safety/judgement  Safety/Judgement: Decreased awareness of safety     General Comments: pt attempting to exit bed on arrival with wet pants. pt appropriately asking to take off pant due to wetness. pt asking appropriate questions   General Comments       Exercises     Shoulder Instructions      Home Living Family/patient expects to be discharged to:: Private residence Living Arrangements: Alone Available  Help at Discharge: Available PRN/intermittently;Family;Friend(s) Type of Home: House Home Access: Stairs to enter [employer] of Steps: 1 Entrance Stairs-Rails: None Home Layout: Two level;Able to live on main level with bedroom/bathroom     Bathroom Shower/Tub: Occupational psychologist: Standard     Home Equipment: Cane - single point(no animals /)   Additional Comments: drives to daughter and friend that live within 2 miles but will also drive to grocery store, 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 doctor etc      Prior Functioning/Environment Level of Independence: Independent        Comments: does not cook, uses microwave for all meals, daughter brings him groceries        OT Problem List: Decreased strength;Impaired balance (sitting and/or standing);Decreased safety awareness;Decreased knowledge of use of DME or AE;Decreased knowledge of precautions      OT Treatment/Interventions: Self-care/ADL training;Therapeutic exercise;Neuromuscular education;Energy conservation;Manual therapy;DME and/or AE instruction;Therapeutic activities;Cognitive remediation/compensation;Patient/family education;Balance training    OT Goals(Current goals can be found in the care plan section) Acute Rehab OT Goals Patient Stated Goal: to be able to return home OT Goal Formulation: With patient Time For Goal Achievement: 08/27/19 Potential to Achieve Goals: Good  OT Frequency: Min 2X/week   Barriers to D/C:            Co-evaluation              AM-PAC OT "6 Clicks" Daily Activity     Outcome Measure Help from another person eating meals?: A Little Help from another person taking care of personal grooming?: A Little Help from another person toileting, which includes using toliet, bedpan, or urinal?: A Little Help from another person bathing (including washing, rinsing, drying)?: A Little Help from another person to put on and taking off regular upper body clothing?: A Little Help  from another person to put on and taking off regular lower body clothing?: A Lot 6 Click Score: 17   End of Session Equipment Utilized During Treatment: Gait belt;Rolling walker Nurse Communication: Mobility status;Precautions  Activity Tolerance: Patient tolerated treatment well Patient left: in bed;with call bell/phone within reach;with bed alarm set  OT Visit Diagnosis: Unsteadiness on feet (R26.81);Muscle weakness (generalized) (M62.81)                Time: [id number] OT Time Calculation (min): 31 min Charges:  OT General Charges $OT Visit: 1 Visit OT Evaluation $OT Eval Moderate Complexity: 1 Mod OT Treatments $Self Care/Home Management : 8-22 mins   Brynn, OTR/L  Acute Rehabilitation Services Pager: [phone] Office: [phone] .   Jeri Modena 08/13/2019, 2:09 PM

## 2019-08-13 NOTE — Progress Notes (Signed)
STROKE TEAM PROGRESS NOTE   INTERVAL HISTORY I have personally reviewed history of presenting illness with the patient in detail, reviewed electronic medical records and imaging films in PACS and answered questions.  He presented with nearly 1 week history of gait imbalance and falls due to subacute right cerebellar infarct likely cardioembolic from his atrial fibrillation despite being on anticoagulation with Eliquis. Vitals:   08/13/19 0000 08/13/19 0400 08/13/19 0749 08/13/19 0828  BP: (!) 126/53 130/78 (!) 148/71 (!) 160/71  Pulse: 63 66 62 60  Resp: 20 20 20 18   Temp: 97.9 F (36.6 C) 98 F (36.7 C) 98.4 F (36.9 C) 99.2 F (37.3 C)  TempSrc: Oral Oral Oral Oral  SpO2: 98% 98% 96% 100%    CBC:  Recent Labs  Lab 08/12/19 1320  WBC 10.7*  HGB 14.5  HCT 43.6  MCV 96.0  PLT [id number]    Basic Metabolic Panel:  Recent Labs  Lab 08/12/19 1320 08/12/19 1520  NA 135  --   K 4.4  --   CL 103  --   CO2 22  --   GLUCOSE 244*  --   BUN 18  --   CREATININE 1.23  --   CALCIUM 9.0  --   MG  --  2.0  PHOS  --  2.6   Lipid Panel:     Component Value Date/Time   CHOL 136 08/13/2019 0600   TRIG 91 08/13/2019 0600   TRIG 113 03/26/2006 1118   HDL 34 (L) 08/13/2019 0600   CHOLHDL 4.0 08/13/2019 0600   VLDL 18 08/13/2019 0600   LDLCALC 84 08/13/2019 0600   HgbA1c:  Lab Results  Component Value Date   HGBA1C 7.6 (H) 08/12/2019   Urine Drug Screen: No results found for: LABOPIA, COCAINSCRNUR, LABBENZ, AMPHETMU, THCU, LABBARB  Alcohol Level No results found for: ETH  IMAGING past 24 hours CT ANGIO HEAD W OR WO CONTRAST  Result Date: 08/13/2019 CLINICAL DATA:  Stroke follow-up EXAM: CT ANGIOGRAPHY HEAD AND NECK TECHNIQUE: Multidetector CT imaging of the head and neck was performed using the standard protocol during bolus administration of intravenous contrast. Multiplanar CT image reconstructions and MIPs were obtained to evaluate the vascular anatomy. Carotid stenosis  measurements (when applicable) are obtained utilizing NASCET criteria, using the distal internal carotid diameter as the denominator. CONTRAST:  28mL OMNIPAQUE IOHEXOL 350 MG/ML SOLN COMPARISON:  Head CT from yesterday FINDINGS: CTA NECK FINDINGS Aortic arch: Multifocal atherosclerotic plaque. No aneurysm or dissection. Two vessel arch branching. Partially covered CABG including the LIMA. Right carotid system: Calcified plaque at the bifurcation primarily affecting the ECA origin. No flow limiting stenosis in the common or internal carotid arteries. No ulceration. Subtle but convincing mid ICA beading. Left carotid system: Calcified plaque at the bifurcation primarily on the posterior wall. No flow limiting stenosis or ulceration. There is distal ICA tortuosity with looping. Mild beaded appearance to the mid left ICA. Vertebral arteries: Proximal subclavian atherosclerosis or ulceration without flow limiting stenosis.Vertebral arteries are smooth and widely patent. Skeleton: Advanced cervical spine degeneration with multilevel ankylosis. Other neck: No evidence of mass or adenopathy. Deformity of the thyroid cartilage and larynx which appears posttraumatic. Upper chest: Negative Review of the MIP images confirms the above findings CTA HEAD FINDINGS Anterior circulation: Atherosclerotic plaque on the carotid siphons without focal or high-grade stenosis. 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 Extensive atheromatous irregularity of medium size vessels. Negative for aneurysm or vascular malformation.  Posterior circulation: Slight dominance of the right vertebral artery. Both picas are symmetrically patent. Basilar is sufficiently patent. Fetal type right PCA flow. Moderate atheromatous irregularity of posterior cerebral arteries. Negative for aneurysm Venous sinuses: Unremarkable as permitted by contrast timing Anatomic variants: None significant. A noncontrast phase was not acquired.  There is a known right PICA territory infarct with fourth ventricular narrowing inferiorly. Ventricular volume appears stable Review of the MIP images confirms the above findings IMPRESSION: 1. Acute right PICA territory infarct with lower fourth ventricular narrowing but no hydrocephalus currently. 2. No acute vascular finding. There is symmetric flow in both picas and no underlying atheromatous flow limiting stenosis or ulceration. 3. Extensive intracranial atherosclerosis affecting medium size vessels. 4. Mild fibromuscular dysplasia seen at the cervical ICAs. Electronically Signed   By: Monte Fantasia M.D.   On: 08/13/2019 07:16   CT Head Wo Contrast  Result Date: 08/12/2019 CLINICAL DATA:  Dizziness for a few weeks which has worsened over the last few days. No known injury. EXAM: CT HEAD WITHOUT CONTRAST TECHNIQUE: Contiguous axial images were obtained from the base of the skull through the vertex without intravenous contrast. COMPARISON:  Head CT scan 09/07/2015. FINDINGS: Brain: There is a large area of hypoattenuation in the right cerebellar hemisphere extending to the cortex most consistent with an infarct. Atrophy and extensive chronic microvascular ischemic change have progressed since the prior MRI. No hemorrhage, midline shift or abnormal extra-axial fluid collection. No hydrocephalus or pneumocephalus. Vascular: No hyperdense vessel is identified. Atherosclerosis is noted. Skull: Intact.  No focal lesion. Sinuses/Orbits: Status post cataract surgery.  Otherwise negative. Other: None. IMPRESSION: Larger of hypoattenuation in the right cerebellar hemisphere extending to the cortex is most consistent with an acute or early subacute infarct. Atrophy and extensive chronic microvascular ischemic change have markedly worsened since the prior head CT. Atherosclerosis. Electronically Signed   By: Inge Rise M.D.   On: 08/12/2019 16:45   CT ANGIO NECK W OR WO CONTRAST  Result Date:  08/13/2019 CLINICAL DATA:  Stroke follow-up EXAM: CT ANGIOGRAPHY HEAD AND NECK TECHNIQUE: Multidetector CT imaging of the head and neck was performed using the standard protocol during bolus administration of intravenous contrast. Multiplanar CT image reconstructions and MIPs were obtained to evaluate the vascular anatomy. Carotid stenosis measurements (when applicable) are obtained utilizing NASCET criteria, using the distal internal carotid diameter as the denominator. CONTRAST:  11mL OMNIPAQUE IOHEXOL 350 MG/ML SOLN COMPARISON:  Head CT from yesterday FINDINGS: CTA NECK FINDINGS Aortic arch: Multifocal atherosclerotic plaque. No aneurysm or dissection. Two vessel arch branching. Partially covered CABG including the LIMA. Right carotid system: Calcified plaque at the bifurcation primarily affecting the ECA origin. No flow limiting stenosis in the common or internal carotid arteries. No ulceration. Subtle but convincing mid ICA beading. Left carotid system: Calcified plaque at the bifurcation primarily on the posterior wall. No flow limiting stenosis or ulceration. There is distal ICA tortuosity with looping. Mild beaded appearance to the mid left ICA. Vertebral arteries: Proximal subclavian atherosclerosis or ulceration without flow limiting stenosis.Vertebral arteries are smooth and widely patent. Skeleton: Advanced cervical spine degeneration with multilevel ankylosis. Other neck: No evidence of mass or adenopathy. Deformity of the thyroid cartilage and larynx which appears posttraumatic. Upper chest: Negative Review of the MIP images confirms the above findings CTA HEAD FINDINGS Anterior circulation: Atherosclerotic plaque on the carotid siphons without focal or high-grade stenosis. 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 Extensive atheromatous irregularity of medium  size vessels. Negative for aneurysm or vascular malformation. Posterior circulation: Slight dominance of the right  vertebral artery. Both picas are symmetrically patent. Basilar is sufficiently patent. Fetal type right PCA flow. Moderate atheromatous irregularity of posterior cerebral arteries. Negative for aneurysm Venous sinuses: Unremarkable as permitted by contrast timing Anatomic variants: None significant. A noncontrast phase was not acquired. There is a known right PICA territory infarct with fourth ventricular narrowing inferiorly. Ventricular volume appears stable Review of the MIP images confirms the above findings IMPRESSION: 1. Acute right PICA territory infarct with lower fourth ventricular narrowing but no hydrocephalus currently. 2. No acute vascular finding. There is symmetric flow in both picas and no underlying atheromatous flow limiting stenosis or ulceration. 3. Extensive intracranial atherosclerosis affecting medium size vessels. 4. Mild fibromuscular dysplasia seen at the cervical ICAs. Electronically Signed   By: Monte Fantasia M.D.   On: 08/13/2019 07:16    PHYSICAL EXAM Pleasant mildly obese elderly Caucasian male not in distress. . Afebrile. Head is nontraumatic. Neck is supple without bruit.    Cardiac exam no murmur or gallop. Lungs are clear to auscultation. Distal pulses are well felt. Neurological Exam :  He is awake alert oriented to time place and person.  Slightly diminished attention registration and recall.  He cannot tell me the name of the president.  Follows simple midline and one-step commands.  Extraocular movements are full range but he has some right beating nystagmus and saccadic dysmetria on right gaze.  Face is symmetric without weakness.  Tongue is midline.  Motor system exam shows symmetric upper and lower extremity strength without focal weakness.  Sensation is intact bilaterally plantars are downgoing.  Gait not tested. ASSESSMENT/PLAN [name] is a 86 y.o. male with history of AF presenting with dizziness since Sunday w/ several falls past few days.    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    CT head hypoattenuation R cerebellar c/w infarct. Worsened Small vessel disease & Atrophy since last CT. Atherosclerosis   CTA head & neck R PICA territory infarct w/ lower 4th ventricle narrowing. No hydrocephalus. Extensive intracranial atherosclerosis. No vascular finding. Mild FMD cervical ICAs.  2D Echo EF 60-65%. No source of embolus   LDL 84  HgbA1c 7.6  Eliquis for VTE prophylaxis  Eliquis (apixaban) daily prior to admission, now on aspirin 325 mg daily. Resume home Eliquis. No additional aspirin. Orders placed.  Therapy recommendations:  CIR  Disposition:  pending   Atrial Fibrillation/CHB/Pacer  Home anticoagulation:  Eliquis (apixaban) daily  . Continue Eliquis (apixaban) daily in hospital and at discharge   Hypertension  Stable . Permissive hypertension (OK if < 220/120) but gradually normalize in 5-7 days . Long-term BP goal normotensive  Hyperlipidemia  Home meds:  pravachol 40  Now on Pravachol 80  LDL 84, goal < 70  Continue statin at discharge  Diabetes type II Uncontrolled  HgbA1c 7.6, goal < 7.0  Other Stroke Risk Factors  Advanced age  ETOH use, advised to drink no more than 2 drink(s) a day  Family hx stroke (father)  Coronary artery disease  AAA  Other Active Problems  Hypothyroidism    Hospital day # 0 Patient presented with with nearly 1 week history of imbalance and falls due to subacute right PICA infarct from atrial fibrillation despite anticoagulation with Eliquis.  I discussed with patient alternatives to Eliquis and lack of definitive  data suggesting switching to Xarelto, Pradaxa or Sayvasa being  any superior also adding aspirin has not been shown to be necessarily superior but may increase risk of bleeding.  Patient wants to stay on Eliquis and I am in  agreement.  Resume Eliquis.  Aggressive risk factor modification.  Continue ongoing therapies.  Transfer back to assisted living or skilled nursing as per therapy needs.  Discussed with Dr. [place] Pink.  Greater than 50% time during this 35-minute visit was spent on counseling and coordination of care and discussion with care team.  Stroke team will sign off.  Kindly call for questions. Antony Contras, MD To contact Stroke Continuity provider, please refer to [email or web address] After hours, contact General Neurology

## 2019-08-13 NOTE — Evaluation (Signed)
Physical Therapy Evaluation Patient Details Name: [name] MRN: [id number] DOB: 26-Jul-1925 Today's Date: 08/13/2019   History of Present Illness  86 yo male s/p fall with dizziness MRI fairly large cerebellar infarct. PMH afib on eliquis, AAA, CAD, DM, Diverticulosis, Fall 2018 with facial trauma, HTN,   Clinical Impression  Prior to admission, pt lives alone and is independent. Has good family support and daughter who lives close by. Pt presents with decreased functional mobility secondary to dynamic balance impairments, weakness and decreased activity tolerance. Pt ambulating 65 feet with a walker at a min assist level. Presents as a high fall risk based on decreased gait speed and safety awareness. Recommending CIR to maximize functional independence and decrease caregiver burden. Suspect good progress based on PLOF and motivation.    Follow Up Recommendations CIR    Equipment Recommendations  Rolling walker with 5" wheels;3in1 (PT);Wheelchair (measurements PT);Wheelchair cushion (measurements PT)    Recommendations for Other Services       Precautions / Restrictions Precautions Precautions: Fall Restrictions Weight Bearing Restrictions: No      Mobility  Bed Mobility Overal bed mobility: Needs Assistance Bed Mobility: Supine to Sit;Sit to Supine     Supine to sit: Min assist Sit to supine: Min assist   General bed mobility comments: MinA for trunk elevation to upright and LE management back into bed  Transfers Overall transfer level: Needs assistance Equipment used: Rolling walker (2 wheeled) Transfers: Sit to/from Stand Sit to Stand: Min assist         General transfer comment: cues for safety with RW and to remain in RW   Ambulation/Gait Ambulation/Gait assistance: Min assist Gait Distance (Feet): 65 Feet Assistive device: Rolling walker (2 wheeled) Gait Pattern/deviations: Step-through pattern;Decreased stride length;Narrow base of support;Trunk flexed      General Gait Details: MinA for stability, cues for wider BOS, upper trunk control and upward gaze. Pt unable to significantly correct.  Stairs            Wheelchair Mobility    Modified Rankin (Stroke Patients Only) Modified Rankin (Stroke Patients Only) Pre-Morbid Rankin Score: Moderate disability Modified Rankin: Moderately severe disability     Balance Overall balance assessment: Needs assistance Sitting-balance support: Feet supported Sitting balance-Leahy Scale: Fair     Standing balance support: Bilateral upper extremity supported;During functional activity Standing balance-Leahy Scale: Poor Standing balance comment: reliant on external support                             Pertinent Vitals/Pain Pain Assessment: No/denies pain    Home Living Family/patient expects to be discharged to:: Private residence Living Arrangements: Alone Available Help at Discharge: Available PRN/intermittently;Family;Friend(s) Type of Home: House Home Access: Stairs to enter Entrance Stairs-Rails: None Entrance Stairs-Number of Steps: 1 Home Layout: Two level;Able to live on main level with bedroom/bathroom Home Equipment: Kasandra Knudsen - single point(no animals /) Additional Comments: drives to daughter and friend that live within 2 miles but will also drive to grocery store, 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 doctor etc    Prior Function Level of Independence: Independent         Comments: does not cook, uses microwave for all meals, daughter brings him groceries     Hand Dominance   Dominant Hand: Right    Extremity/Trunk Assessment   Upper Extremity Assessment Upper Extremity Assessment: Generalized weakness    Lower Extremity Assessment Lower Extremity Assessment: Generalized weakness    Cervical / Trunk  Assessment Cervical / Trunk Assessment: Kyphotic  Communication   Communication: Expressive difficulties  Cognition Arousal/Alertness: Awake/alert Behavior During  Therapy: WFL for tasks assessed/performed Overall Cognitive Status: Impaired/Different from baseline Area of Impairment: Safety/judgement                         Safety/Judgement: Decreased awareness of safety     General Comments: pt attempting to exit bed on arrival with wet pants. pt appropriately asking to take off pant due to wetness. pt asking appropriate questions      General Comments      Exercises     Assessment/Plan    PT Assessment Patient needs continued PT services  PT Problem List Decreased strength;Decreased activity tolerance;Decreased balance;Decreased mobility;Decreased cognition;Decreased coordination       PT Treatment Interventions Gait training;DME instruction;Stair training;Functional mobility training;Therapeutic activities;Therapeutic exercise;Balance training;Patient/family education    PT Goals (Current goals can be found in the Care Plan section)  Acute Rehab PT Goals Patient Stated Goal: to be able to return home PT Goal Formulation: With patient/family Time For Goal Achievement: 08/27/19 Potential to Achieve Goals: Good    Frequency Min 4X/week   Barriers to discharge        Co-evaluation               AM-PAC PT "6 Clicks" Mobility  Outcome Measure Help needed turning from your back to your side while in a flat bed without using bedrails?: None Help needed moving from lying on your back to sitting on the side of a flat bed without using bedrails?: A Little Help needed moving to and from a bed to a chair (including a wheelchair)?: A Little Help needed standing up from a chair using your arms (e.g., wheelchair or bedside chair)?: A Little Help needed to walk in hospital room?: A Little Help needed climbing 3-5 steps with a railing? : A Lot 6 Click Score: 18    End of Session Equipment Utilized During Treatment: Gait belt Activity Tolerance: Patient tolerated treatment well Patient left: in bed;with call bell/phone  within reach;with bed alarm set;with family/visitor present Nurse Communication: Mobility status PT Visit Diagnosis: Unsteadiness on feet (R26.81);Difficulty in walking, not elsewhere classified (R26.2)    Time: 1450-1516 PT Time Calculation (min) (ACUTE ONLY): 26 min   Charges:   PT Evaluation $PT Eval Moderate Complexity: 1 Mod PT Treatments $Gait Training: 8-22 mins          Wyona Almas, PT, DPT Acute Rehabilitation Services Pager [phone] Office [phone]   [name] 08/13/2019, 4:57 PM

## 2019-08-13 NOTE — Consult Note (Signed)
Neurology Consultation Reason for Consult: Stroke Referring Physician: Laverna Frank  CC: Stroke  History is obtained from: Patient  HPI: [name] is a 86 y.o. male with a history of A. fib on Eliquis who presented with dizziness that started on Sunday.  Maximal on Sunday, and has been getting gradually better since then.  He has had some mild nausea, but no vomiting.  He denies diplopia.  He has had several falls over the past few days.  Due to this, he sought care in the emergency department where a CT confirmed a large cerebellar stroke.   LKW: Sunday tpa given?: no, outside of window    ROS: A 14 point ROS was performed and is negative except as noted in the HPI.   Past Medical History:  Diagnosis Date  . AAA (abdominal aortic aneurysm) ([place])   . Anemia due to chronic blood loss 11/25/2008   Recurrent over the years EGD and colonoscopy x 2 each 2004 and 2010 without cause    . Atrial fibrillation ([place])   . BPH (benign prostatic hyperplasia)    reports aprocedure (TURP) remotely in [place].Marland KitchenNo futher f/u w/ urology  . CAD (coronary artery disease)   . Diabetes mellitus, type 2 ([place])   . Diverticulosis    left colon  . Erosive gastritis   . Fall 08/08/2016   OUTSIDE AT HOME FACIAL TRAUMA   . GERD (gastroesophageal reflux disease)   . Hyperlipidemia   . Hypertension   . Hypothyroidism   . Insomnia    transient  . Osteopenia    dexa 2-11  . Vitamin B12 deficiency      Family History  Problem Relation Age of Onset  . Alzheimer's disease Mother   . Heart failure Father   . CVA Father   . Coronary artery disease Brother        cabg  . Alzheimer's disease Sister   . Mental illness Sister   . Alzheimer's disease Sister   . Other Sister        lung problems  . Diabetes Other        several siblings  . Prostate cancer Neg Hx   . Colon cancer Neg Hx   . Esophageal cancer Neg Hx   . Stomach cancer Neg Hx   . Rectal cancer Neg Hx      Social History:  reports  that he has never smoked. He has never used smokeless tobacco. He reports current alcohol use of about 1.0 - 2.0 standard drinks of alcohol per week. He reports that he does not use drugs.   Exam: Current vital signs: BP 130/78 (BP Location: Right Arm)   Pulse 66   Temp 98 F (36.7 C) (Oral)   Resp 20   SpO2 98%  Vital signs in last 24 hours: Temp:  [97.9 F (36.6 C)-98.8 F (37.1 C)] 98 F (36.7 C) (04/02 0400) Pulse Rate:  [59-73] 66 (04/02 0400) Resp:  [16-26] 20 (04/02 0400) BP: (100-143)/(53-78) 130/78 (04/02 0400) SpO2:  [95 %-100 %] 98 % (04/02 0400)   Physical Exam  Constitutional: Appears well-developed and well-nourished.  Psych: Affect appropriate to situation Eyes: No scleral injection HENT: No OP obstrucion MSK: no joint deformities.  Cardiovascular: Normal rate and regular rhythm.  Respiratory: Effort normal, non-labored breathing GI: Soft.  No distension. There is no tenderness.  Skin: WDI  Neuro: Mental Status: Patient is awake, alert, oriented to person, place, year, and situation.  He gives the month as  November No signs of aphasia or neglect Cranial Nerves: II: Visual Fields are full. Pupils are equal, round, and reactive to light.   III,IV, VI: EOMI without ptosis or diploplia.  He has right beating nystagmus on rightward gaze V: Facial sensation is symmetric to temperature VII: Facial movement is symmetric.  VIII: hearing is intact to voice X: Uvula elevates symmetrically XI: Shoulder shrug is symmetric. XII: tongue is midline without atrophy or fasciculations.  Motor: Tone is normal. Bulk is normal. 5/5 strength was present in all four extremities.  Sensory: Sensation is symmetric to light touch and temperature in the arms and legs. Cerebellar: He is slow on finger-nose-finger on the right compared to the left, but he performs heel-knee-shin relatively well.   I have reviewed labs in epic and the results pertinent to this consultation  are: Glucose 244 Creatinine 1.2  I have reviewed the images obtained: CT head-large cerebellar infarct in the right  Impression: 86 year old male with a history of atrial fibrillation on anticoagulation who presents with fairly large cerebellar infarct.  Likely embolic in nature.  I do not think there is convincing evidence to change from one anticoagulant to another in this scenario.  I do wonder about some slight possible hyperintensity inside the infarct, and would favor holding anticoagulation for the time being and repeating head imaging, and we can do vascular imaging at the same time.  Recommendations: - HgbA1c, fasting lipid panel - Frequent neuro checks - Echocardiogram - CTA head and neck - Prophylactic therapy-Antiplatelet med: Aspirin - dose 325mg  PO or 300mg  PR - Risk factor modification - Telemetry monitoring - PT consult, OT consult, Speech consult - Stroke team to follow   Roland Rack, MD [facility] [phone]  If 7pm- 7am, please page neurology on call as listed in [place].

## 2019-08-13 NOTE — Progress Notes (Signed)
  Echocardiogram 2D Echocardiogram has been performed.  [name] 08/13/2019, 12:22 PM

## 2019-08-14 ENCOUNTER — Encounter (HOSPITAL_COMMUNITY): Payer: Medicare HMO

## 2019-08-14 ENCOUNTER — Inpatient Hospital Stay (HOSPITAL_COMMUNITY): Payer: Medicare HMO

## 2019-08-14 DIAGNOSIS — M25551 Pain in right hip: Secondary | ICD-10-CM

## 2019-08-14 LAB — GLUCOSE, CAPILLARY
Glucose-Capillary: 183 mg/dL — ABNORMAL HIGH (ref 70–99)
Glucose-Capillary: 212 mg/dL — ABNORMAL HIGH (ref 70–99)
Glucose-Capillary: 120 mg/dL — ABNORMAL HIGH (ref 70–99)
Glucose-Capillary: 133 mg/dL — ABNORMAL HIGH (ref 70–99)

## 2019-08-14 MED ORDER — LORAZEPAM 2 MG/ML IJ SOLN
0.2500 mg | Freq: Four times a day (QID) | INTRAMUSCULAR | Status: DC | PRN
  Administered 2019-08-14 – 2019-08-15 (×4): 0.25 mg via INTRAVENOUS
  Filled 2019-08-14 (×4): qty 1

## 2019-08-14 NOTE — Progress Notes (Signed)
PROGRESS NOTE  [name] [id number] DOB: 1925/08/04 DOA: 08/12/2019 PCP: Colon Branch, MD  HPI/Recap of past 75 hours: 86 year old male with past medical history of permanent A. fib and complete heart block status post pacemaker on chronic anticoagulation with Eliquis plus hypertension, hypothyroidism and diabetes mellitus who presented to the emergency room with complaints of progressive dizziness over the last few days.  Patient has also had a number of falls.  Patient's daughters clarified that he lives at home alone and he is actually not been very compliant with his insulin and possibly other medications.  Patient stated he was in his usual state of health up and about 4 days ago he started having mild headaches.  In the emergency room, he was noted to have some slurred speech and a CT scan of the head noted concern for hypoattenuation in the right cerebellar hemisphere extending to the cortex consistent with acute to early subacute infarct.  Unable to get MRI because of pacemaker.  Neurology consulted.  Patient's stroke felt to be secondary to noncompliance/confusion with taking his medications given that he lives alone.  PT and OT recommended inpatient rehab.  Neurology recommended continuing Eliquis.  On morning of 4/3, patient got himself out of bed and then fell landing on his right hip and hitting the posterior aspect of the right side of his head.  Some mild bleeding from a scalp laceration.  X-ray done of his right hip noted no evidence of fracture.  Patient having some mild soreness.  Complains of has had hurting where he hit it.  No loss of consciousness noted.    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  I suspect that his CVA occurred due to noncompliance with his medications.  Patient apparently has been getting confused by this.  Despite his advanced age, he lives alone and still drives.   Symptoms are still persistent with slurred speech, diminished attention impaired gait and some dizziness.  Awaiting for speech to see.  Seen by PT and OT who are recommending inpatient rehab.  Still having episodes intermittently of confusion, I suspect that he has underlying dementia.  Would continue Eliquis at this time.  Discussed with daughters and he likely will need a long-term alternative living arrangement Active Problems:   Hypothyroidism: Continue Synthroid  Fall with scalp laceration and hip bruising: No evidence of fracture.  Okay for ambulation with PT    DM (diabetes mellitus) with complications [facility]): Patient has not been taking his diabetic medications properly.  Follow with insulin sliding scale here.  A1c actually notes decent control at 7.6.  HTN (hypertension): Allowing for permissive hypertension.    Permanent atrial fibrillation ([place]): Status post pacemaker    BPH (benign prostatic hyperplasia)  Hyperlipidemia: Patient would benefit from statin to decrease LDL to goal below 70, LDL at 84.  Code Status: Full code  Family Communication: Left message for daughters  Disposition Plan: Awaiting inpatient rehab evaluation   Consultants:  Inpatient rehab  Neurology  Procedures:  Echocardiogram done 4/2 notes preserved ejection fraction, no evidence of valvular dysfunction, indeterminate diastolic function  Antimicrobials:  None  DVT prophylaxis: SCDs   Objective: Vitals:   08/14/19 0843 08/14/19 1139  BP:  127/70  Pulse: 80 65  Resp:  18  Temp:  97.8 F (36.6 C)  SpO2:  97%    Intake/Output Summary (Last 24 hours) at 08/14/2019 1546 Last data filed at 08/14/2019  0900 Gross per 24 hour  Intake 300 ml  Output --  Net 300 ml   There were no vitals filed for this visit. There is no height or weight on file to calculate BMI.  Exam:   General: Alert and oriented x2, mild distress after fall.  He intermittently has confusion  HEENT: Normocephalic.   Noted small superficial laceration at right parietal area with Steri-Strip bandages.  Neck is supple, no JVD  Cardiovascular: Regular rate and rhythm, S1-S2  Respiratory: Clear to auscultation bilaterally  Abdomen: Soft, nontender, nondistended, positive bowel sounds  Musculoskeletal: No clubbing or cyanosis or edema.  Patient able to have straight leg raising and obturation on right side  Skin: No skin breaks, tears or lesions  Psychiatry: Appropriate, but sometimes easily distracted  Neuro: Strength appears symmetric, but globally weak.  Did not test balance to get out of bed to risk for fall   Data Reviewed: CBC: Recent Labs  Lab 08/12/19 1320  WBC 10.7*  HGB 14.5  HCT 43.6  MCV 96.0  PLT [id number]   Basic Metabolic Panel: Recent Labs  Lab 08/12/19 1320 08/12/19 1520  NA 135  --   K 4.4  --   CL 103  --   CO2 22  --   GLUCOSE 244*  --   BUN 18  --   CREATININE 1.23  --   CALCIUM 9.0  --   MG  --  2.0  PHOS  --  2.6   GFR: CrCl cannot be calculated (Unknown ideal weight.). Liver Function Tests: No results for input(s): AST, ALT, ALKPHOS, BILITOT, PROT, ALBUMIN in the last 168 hours. No results for input(s): LIPASE, AMYLASE in the last 168 hours. No results for input(s): AMMONIA in the last 168 hours. Coagulation Profile: No results for input(s): INR, PROTIME in the last 168 hours. Cardiac Enzymes: No results for input(s): CKTOTAL, CKMB, CKMBINDEX, TROPONINI in the last 168 hours. BNP (last 3 results) No results for input(s): PROBNP in the last 8760 hours. HbA1C: Recent Labs    08/12/19 0913  HGBA1C 7.6*   CBG: Recent Labs  Lab 08/13/19 1137 08/13/19 1706 08/13/19 2130 08/14/19 0600 08/14/19 1211  GLUCAP 201* 177* 155* 183* 212*   Lipid Profile: Recent Labs    08/13/19 0600  CHOL 136  HDL 34*  LDLCALC 84  TRIG 91  CHOLHDL 4.0   Thyroid Function Tests: Recent Labs    08/12/19 1520  TSH 0.367   Anemia Panel: No results for input(s):  VITAMINB12, FOLATE, FERRITIN, TIBC, IRON, RETICCTPCT in the last 72 hours. Urine analysis:    Component Value Date/Time   COLORURINE YELLOW 08/12/2019 1557   APPEARANCEUR CLEAR 08/12/2019 1557   LABSPEC 1.024 08/12/2019 1557   PHURINE 6.0 08/12/2019 1557   GLUCOSEU 150 (A) 08/12/2019 1557   HGBUR NEGATIVE 08/12/2019 1557   BILIRUBINUR NEGATIVE 08/12/2019 1557   KETONESUR NEGATIVE 08/12/2019 1557   PROTEINUR NEGATIVE 08/12/2019 1557   UROBILINOGEN 0.2 08/29/2012 1129   NITRITE NEGATIVE 08/12/2019 1557   LEUKOCYTESUR NEGATIVE 08/12/2019 1557   Sepsis Labs: @LABRCNTIP (procalcitonin:4,lacticidven:4)  ) Recent Results (from the past 240 hour(s))  SARS CORONAVIRUS 2 (TAT 6-24 HRS) Nasopharyngeal Nasopharyngeal Swab     Status: None   Collection Time: 08/12/19  6:55 PM   Specimen: Nasopharyngeal Swab  Result Value Ref Range Status   SARS Coronavirus 2 NEGATIVE NEGATIVE Final    Comment: (NOTE) SARS-CoV-2 target nucleic acids are NOT DETECTED. The SARS-CoV-2 RNA is generally detectable in upper  and lower respiratory specimens during the acute phase of infection. Negative results do not preclude SARS-CoV-2 infection, do not rule out co-infections with other pathogens, and should not be used as the sole basis for treatment or other patient management decisions. Negative results must be combined with clinical observations, patient history, and epidemiological information. The expected result is Negative. Fact Sheet for Patients: SugarRoll.be Fact Sheet for Healthcare Providers: https://www.woods-mathews.com/ This test is not yet approved or cleared by the [place] FDA and  has been authorized for detection and/or diagnosis of SARS-CoV-2 by FDA under an Emergency Use Authorization (EUA). This EUA will remain  in effect (meaning this test can be used) for the duration of the COVID-19 declaration under Section 56 4(b)(1) of the Act, 21  U.S.C. section 360bbb-3(b)(1), unless the authorization is terminated or revoked sooner. Performed at [facility], [place] [address], [place], [place] [address]       Studies: DG Chest 1 View  Result Date: 08/14/2019 CLINICAL DATA:  Pain following fall EXAM: CHEST  1 VIEW COMPARISON:  July 07, 2017 FINDINGS: The lungs are clear. There is cardiomegaly with pulmonary vascularity within normal limits. Pacemaker leads attached to the right heart, stable. Patient is status post coronary artery bypass grafting. No pneumothorax. No bone lesions. IMPRESSION: Stable cardiomegaly. Postoperative changes. Stable pacemaker lead placements. Lungs clear. Electronically Signed   By: Lowella Grip III M.D.   On: 08/14/2019 11:03   CT HEAD WO CONTRAST  Result Date: 08/14/2019 CLINICAL DATA:  Follow-up head trauma.  Head trauma. EXAM: CT HEAD WITHOUT CONTRAST TECHNIQUE: Contiguous axial images were obtained from the base of the skull through the vertex without intravenous contrast. COMPARISON:  Head CT from 2 days ago FINDINGS: Brain: Acute to early subacute right PICA territory infarct with stable extent of low-density. Effacement of the lower fourth ventricle without hydrocephalus or increasing ventricular volume. Along the lower aspect of the infarct is vague high-density that maintains cerebellar architecture. Extensive chronic small vessel ischemia with confluent low-density in the deep white matter and remote lacunar infarcts at the basal ganglia. No hemorrhage. Vascular: Atherosclerotic calcification Skull: Unremarkable Sinuses/Orbits: Bilateral cataract resection IMPRESSION: Right PICA territory infarct with petechial hemorrhage and fourth ventricular narrowing. No hydrocephalus or interval change. Electronically Signed   By: Monte Fantasia M.D.   On: 08/14/2019 07:38   DG HIP UNILAT WITH PELVIS 2-3 VIEWS RIGHT  Result Date: 08/14/2019 CLINICAL DATA:  Pain following fall EXAM: DG HIP (WITH OR  WITHOUT PELVIS) 2-3V RIGHT COMPARISON:  None. FINDINGS: Frontal pelvis as well as frontal and lateral right hip images were obtained. Bones are osteoporotic. There is a total hip replacement on the right with prosthetic components well-seated. No fracture or dislocation. No erosive change. There is slight narrowing of the left hip joint. Penile prosthesis present. IMPRESSION: Status post total hip replacement on the right with prosthetic components well-seated. No fracture or dislocation. Mild narrowing left hip joint. Electronically Signed   By: Lowella Grip III M.D.   On: 08/14/2019 11:02    Scheduled Meds: . apixaban  5 mg Oral BID  . digoxin  0.125 mg Oral QODAY  . escitalopram  10 mg Oral Daily  . fenofibrate  54 mg Oral Daily  . insulin aspart  0-5 Units Subcutaneous QHS  . insulin aspart  0-9 Units Subcutaneous TID WC  . levothyroxine  100 mcg Oral QAC breakfast  . linagliptin  5 mg Oral Daily  . loratadine  10 mg Oral Daily  .  metoprolol tartrate  12.5 mg Oral BID  . mirtazapine  15 mg Oral QHS  . pantoprazole  40 mg Oral QAC breakfast  . pravastatin  80 mg Oral Daily    Continuous Infusions:   LOS: 1 day     Annita Brod, MD [facility]   08/14/2019, 3:46 PM

## 2019-08-14 NOTE — Progress Notes (Signed)
This nurse entered pt room to administer AM dose of insulin. Pt was found to have a bloodstain of approx 3" x 3" on his pillowcase. Pt was assessed & found to have a cut of approx 1'-1.5" on his head near his crown. Pt stated he had gotten out of bed to sit in a chair, but decided he would rather sit in another chair that was a short distance away. Pt stated he fell in the course of ambulating to that chair. Fall was not witnessed by staff. Pt's wound was cleaned and dressed. MD on call was notified. Order received for CT head. Subsequently, pt advised this nurse that his right leg hurt. Pt was found to have a bruise of approx 2" x 3" on right lateral thigh. Pt indicated that the pain was located below his hip but above the area of the bruise. Pt able to move leg. MD notified of pt's complaint of leg pain. Family notified of pt's fall.

## 2019-08-14 NOTE — Evaluation (Signed)
SLP Cancellation Note  Patient Details Name: [name] MRN: [id number] DOB: Sep 21, 1925   Cancelled treatment:       Reason Eval/Treat Not Completed: Other (comment)(SLP apologizes for delay in services, will see pt as soon as schedule allows for evaluation, RN reports pt with dysarthria and apparently this am had a fall)  Note per PT/OT, recommendations for for CIR.  If pt dc to CIR before SLP cognitive linguistic evaluation, recommend order at this venue.  Kathleen Lime, MS [facility] SLP Acute Rehab Services Office [phone]   Macario Golds 08/14/2019, 1:16 PM

## 2019-08-14 NOTE — Progress Notes (Signed)
PT Cancellation Note  Patient Details Name: [name] MRN: [id number] DOB: 11-05-1925   Cancelled Treatment:    Reason Eval/Treat Not Completed: Other (comment). Pt fell earlier this morning and complaining of hip pain. Per Dr. [place] Pink pt with fractured hp however per radiology report pt with no fracture. Awaiting clarification prior to mobilizing patient. PT to return as able as appropriate.   Kittie Plater, PT, DPT Acute Rehabilitation Services Pager #: [phone] Office #: [phone]    Berline Lopes 08/14/2019, 1:02 PM

## 2019-08-14 NOTE — Progress Notes (Signed)
Per shift report, pt fell this am. Pt c/o right hip pain which was assessed to be bruised and swelling noted to right hip. Pt report pain below swelling when touched, pt also c/o pain radiating to the back when he stretches his right leg out. Pt MAE x4 and VSS. MD paged and notified no new orders received; awaiting on response from MD. Laceration to head has steri-strips on but continues to ooze. Will continue to closely monitor. Delia Heady RN

## 2019-08-15 DIAGNOSIS — F0390 Unspecified dementia without behavioral disturbance: Secondary | ICD-10-CM | POA: Diagnosis present

## 2019-08-15 DIAGNOSIS — F0392 Unspecified dementia, unspecified severity, with psychotic disturbance: Secondary | ICD-10-CM | POA: Diagnosis present

## 2019-08-15 LAB — GLUCOSE, CAPILLARY
Glucose-Capillary: 158 mg/dL — ABNORMAL HIGH (ref 70–99)
Glucose-Capillary: 164 mg/dL — ABNORMAL HIGH (ref 70–99)
Glucose-Capillary: 113 mg/dL — ABNORMAL HIGH (ref 70–99)
Glucose-Capillary: 178 mg/dL — ABNORMAL HIGH (ref 70–99)

## 2019-08-15 NOTE — Progress Notes (Signed)
PROGRESS NOTE  [name] [id number] DOB: 1926/01/08 DOA: 08/12/2019 PCP: Colon Branch, MD  HPI/Recap of past 52 hours: 86 year old male with past medical history of permanent A. fib and complete heart block status post pacemaker on chronic anticoagulation with Eliquis plus hypertension, hypothyroidism and diabetes mellitus who presented to the emergency room with complaints of progressive dizziness over the last few days.  Patient has also had a number of falls.  Patient's daughters clarified that he lives at home alone and he is actually not been very compliant with his insulin and possibly other medications.  Patient stated he was in his usual state of health up and about 4 days ago he started having mild headaches.  In the emergency room, he was noted to have some slurred speech and a CT scan of the head noted concern for hypoattenuation in the right cerebellar hemisphere extending to the cortex consistent with acute to early subacute infarct.  Unable to get MRI because of pacemaker.  Neurology consulted.  Patient's stroke felt to be secondary to noncompliance/confusion with taking his medications given that he lives alone.  PT and OT recommended inpatient rehab.  Neurology recommended continuing Eliquis.  On morning of 4/3, patient got himself out of bed and then fell landing on his right hip and hitting the posterior aspect of the right side of his head.  Some mild bleeding from a scalp laceration.  X-ray done of his right hip noted no evidence of fracture.  No events overnight  This morning, patient a little bit confused.  Discussed with one of his daughters who is at the bedside.  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  I suspect that his CVA occurred due to noncompliance with his medications.  Patient apparently has been getting confused by this.   Despite his advanced age, he lives alone and still drives.  Symptoms are still persistent with slurred speech, diminished attention impaired gait and some dizziness.  Awaiting for speech to see.  Seen by PT and OT who are recommending inpatient rehab.  Still having episodes intermittently of confusion, I suspect that he has underlying dementia.  Would continue Eliquis at this time.  Discussed with daughters and he likely will need a long-term alternative living arrangement Active Problems:   Hypothyroidism: Continue Synthroid  Senile dementia without behavioral disturbance: Patient likely had some functional dementia for some time, minimize because he is at home with the same routine.  Secondary now to CV and being out of his familiar surroundings, at times, he does get a little confused.  In discussion with the daughters they agree that he cannot return back home unassisted.  In the short-term, he needs skilled nursing versus inpatient rehab.  They are deciding I have asked for them to come to a decision by tomorrow as to what the longer term plan should be.  Fall with scalp laceration and hip bruising: No evidence of fracture.  Okay for ambulation with PT    DM (diabetes mellitus) with complications [facility]): Patient has not been taking his diabetic medications properly.  Follow with insulin sliding scale here.  A1c actually notes decent control at 7.6.  HTN (hypertension): Allowing for permissive hypertension.    Permanent atrial fibrillation [facility]): Status post pacemaker AAA and    BPH (benign prostatic hyperplasia)  Hyperlipidemia: Patient would benefit from statin to decrease LDL to  goal below 70, LDL at 84.  Started on Pravachol.  Code Status: Full code  Family Communication: Spoke with daughter at the bedside  Disposition Plan: Awaiting inpatient rehab evaluation versus skilled nursing.   Consultants:  Inpatient rehab  Neurology  Procedures:  Echocardiogram done 4/2 notes  preserved ejection fraction, no evidence of valvular dysfunction, indeterminate diastolic function  Antimicrobials:  None  DVT prophylaxis: SCDs   Objective: Vitals:   08/15/19 0745 08/15/19 1116  BP: 115/64 108/70  Pulse: 62 61  Resp: 18 18  Temp: 98.9 F (37.2 C) 97.9 F (36.6 C)  SpO2: 98% 97%    Intake/Output Summary (Last 24 hours) at 08/15/2019 1449 Last data filed at 08/15/2019 1100 Gross per 24 hour  Intake 480 ml  Output --  Net 480 ml   There were no vitals filed for this visit. There is no height or weight on file to calculate BMI.  Exam:   General: Somewhat drowsy, oriented x1.  Intermittent episodes of confusion  HEENT: Normocephalic.  Noted small superficial laceration at right parietal area with Steri-Strip bandages.  Neck is supple, no JVD  Cardiovascular: Regular rate and rhythm, S1-S2  Respiratory: Clear to auscultation bilaterally  Abdomen: Soft, nontender, nondistended, positive bowel sounds  Musculoskeletal: No clubbing or cyanosis or edema.  Patient able to have straight leg raising and obturation on right side  Skin: No skin breaks, tears or lesions, other than that scalp  Psychiatry: Some underlying dementia  Neuro: Strength appears symmetric, but globally weak.  Did not test balance to get out of bed to risk for fall   Data Reviewed: CBC: Recent Labs  Lab 08/12/19 1320  WBC 10.7*  HGB 14.5  HCT 43.6  MCV 96.0  PLT [id number]   Basic Metabolic Panel: Recent Labs  Lab 08/12/19 1320 08/12/19 1520  NA 135  --   K 4.4  --   CL 103  --   CO2 22  --   GLUCOSE 244*  --   BUN 18  --   CREATININE 1.23  --   CALCIUM 9.0  --   MG  --  2.0  PHOS  --  2.6   GFR: CrCl cannot be calculated (Unknown ideal weight.). Liver Function Tests: No results for input(s): AST, ALT, ALKPHOS, BILITOT, PROT, ALBUMIN in the last 168 hours. No results for input(s): LIPASE, AMYLASE in the last 168 hours. No results for input(s): AMMONIA in the last  168 hours. Coagulation Profile: No results for input(s): INR, PROTIME in the last 168 hours. Cardiac Enzymes: No results for input(s): CKTOTAL, CKMB, CKMBINDEX, TROPONINI in the last 168 hours. BNP (last 3 results) No results for input(s): PROBNP in the last 8760 hours. HbA1C: No results for input(s): HGBA1C in the last 72 hours. CBG: Recent Labs  Lab 08/14/19 1211 08/14/19 1617 08/14/19 2158 08/15/19 0609 08/15/19 1307  GLUCAP 212* 120* 133* 158* 164*   Lipid Profile: Recent Labs    08/13/19 0600  CHOL 136  HDL 34*  LDLCALC 84  TRIG 91  CHOLHDL 4.0   Thyroid Function Tests: Recent Labs    08/12/19 1520  TSH 0.367   Anemia Panel: No results for input(s): VITAMINB12, FOLATE, FERRITIN, TIBC, IRON, RETICCTPCT in the last 72 hours. Urine analysis:    Component Value Date/Time   COLORURINE YELLOW 08/12/2019 1557   APPEARANCEUR CLEAR 08/12/2019 1557   LABSPEC 1.024 08/12/2019 1557   PHURINE 6.0 08/12/2019 1557   GLUCOSEU 150 (A) 08/12/2019 1557   HGBUR  NEGATIVE 08/12/2019 [place] 08/12/2019 1557   KETONESUR NEGATIVE 08/12/2019 1557   PROTEINUR NEGATIVE 08/12/2019 1557   UROBILINOGEN 0.2 08/29/2012 1129   NITRITE NEGATIVE 08/12/2019 1557   LEUKOCYTESUR NEGATIVE 08/12/2019 1557   Sepsis Labs: @LABRCNTIP (procalcitonin:4,lacticidven:4)  ) Recent Results (from the past 240 hour(s))  SARS CORONAVIRUS 2 (TAT 6-24 HRS) Nasopharyngeal Nasopharyngeal Swab     Status: None   Collection Time: 08/12/19  6:55 PM   Specimen: Nasopharyngeal Swab  Result Value Ref Range Status   SARS Coronavirus 2 NEGATIVE NEGATIVE Final    Comment: (NOTE) SARS-CoV-2 target nucleic acids are NOT DETECTED. The SARS-CoV-2 RNA is generally detectable in upper and lower respiratory specimens during the acute phase of infection. Negative results do not preclude SARS-CoV-2 infection, do not rule out co-infections with other pathogens, and should not be used as the sole  basis for treatment or other patient management decisions. Negative results must be combined with clinical observations, patient history, and epidemiological information. The expected result is Negative. Fact Sheet for Patients: SugarRoll.be Fact Sheet for Healthcare Providers: https://www.woods-mathews.com/ This test is not yet approved or cleared by the [place] FDA and  has been authorized for detection and/or diagnosis of SARS-CoV-2 by FDA under an Emergency Use Authorization (EUA). This EUA will remain  in effect (meaning this test can be used) for the duration of the COVID-19 declaration under Section 56 4(b)(1) of the Act, 21 U.S.C. section 360bbb-3(b)(1), unless the authorization is terminated or revoked sooner. Performed at [facility], [place] [address], [place], [place] [address]       Studies: No results found.  Scheduled Meds: . apixaban  5 mg Oral BID  . digoxin  0.125 mg Oral QODAY  . escitalopram  10 mg Oral Daily  . fenofibrate  54 mg Oral Daily  . insulin aspart  0-5 Units Subcutaneous QHS  . insulin aspart  0-9 Units Subcutaneous TID WC  . levothyroxine  100 mcg Oral QAC breakfast  . linagliptin  5 mg Oral Daily  . loratadine  10 mg Oral Daily  . metoprolol tartrate  12.5 mg Oral BID  . mirtazapine  15 mg Oral QHS  . pantoprazole  40 mg Oral QAC breakfast  . pravastatin  80 mg Oral Daily    Continuous Infusions:   LOS: 2 days     Annita Brod, MD [facility]   08/15/2019, 2:49 PM

## 2019-08-15 NOTE — Progress Notes (Signed)
Physical Therapy Treatment Patient Details Name: [name] MRN: [id number] DOB: 08-May-1926 Today's Date: 08/15/2019    History of Present Illness 86 yo male s/p fall with dizziness MRI fairly large cerebellar infarct. PMH afib on eliquis, AAA, CAD, DM, Diverticulosis, Fall 2018 with facial trauma, HTN,     PT Comments    Patient not progressing towards goals today. Noted to be lethargic and sleepy. Per family members in room, pt more confused and restless today pulling at lines and undressing self. Requires Max A for bed mobility. Mod A to stand from EOB with LOB to right in standing. Incontinent of stool upon standing so returned to bed for peri-care. Pt groaning from pain with rolling likely due to right hip pain/bruise. Will continue to follow and progress as tolerated.   Follow Up Recommendations  CIR     Equipment Recommendations  Rolling walker with 5" wheels;3in1 (PT);Wheelchair (measurements PT);Wheelchair cushion (measurements PT)    Recommendations for Other Services       Precautions / Restrictions Precautions Precautions: Fall Restrictions Weight Bearing Restrictions: No    Mobility  Bed Mobility Overal bed mobility: Needs Assistance Bed Mobility: Rolling Rolling: Mod assist   Supine to sit: Max assist;HOB elevated     General bed mobility comments: Rolling to right/left for pericare; assist with LEs, trunk and scooting bottom to EOB. Posterior lean and LOB x2.  Transfers Overall transfer level: Needs assistance Equipment used: Rolling walker (2 wheeled) Transfers: Sit to/from Stand Sit to Stand: Mod assist         General transfer comment: ASsist to power to standing with use of RW; right lateral lean and LOB. + incontinence of stool  Ambulation/Gait             General Gait Details: Deferred due to arousal and BM   Stairs             Wheelchair Mobility    Modified Rankin (Stroke Patients Only) Modified Rankin (Stroke Patients  Only) Pre-Morbid Rankin Score: Moderate disability Modified Rankin: Moderately severe disability     Balance Overall balance assessment: Needs assistance Sitting-balance support: Feet supported;No upper extremity supported Sitting balance-Leahy Scale: Poor Sitting balance - Comments: varying from Min A-total A due to LOB posteriorly. Postural control: Posterior lean Standing balance support: During functional activity Standing balance-Leahy Scale: Poor Standing balance comment: reliant on external support; LOB to the right in standing.                            Cognition Arousal/Alertness: Lethargic;Suspect due to medications Behavior During Therapy: Flat affect Overall Cognitive Status: Impaired/Different from baseline Area of Impairment: Following commands;Attention;Problem solving                   Current Attention Level: Focused   Following Commands: Follows one step commands inconsistently     Problem Solving: Slow processing;Decreased initiation;Difficulty sequencing;Requires verbal cues;Requires tactile cues General Comments: Pt lethargic and sleepy during session; difficulty following 1 step commands; poor attention. Was given ativan earlier in the AM.      Exercises      General Comments General comments (skin integrity, edema, etc.): family present for start of session and left for pericare      Pertinent Vitals/Pain Pain Assessment: Faces Faces Pain Scale: Hurts even more Pain Location: right hip with rolling/movement Pain Descriptors / Indicators: Grimacing;Sore;Moaning Pain Intervention(s): Repositioned;Monitored during session;Limited activity within patient's tolerance    Home  Living                      Prior Function            PT Goals (current goals can now be found in the care plan section) Progress towards PT goals: Not progressing toward goals - comment(due to arousal)    Frequency    Min 4X/week       PT Plan Current plan remains appropriate    Co-evaluation              AM-PAC PT "6 Clicks" Mobility   Outcome Measure  Help needed turning from your back to your side while in a flat bed without using bedrails?: A Lot Help needed moving from lying on your back to sitting on the side of a flat bed without using bedrails?: Total Help needed moving to and from a bed to a chair (including a wheelchair)?: A Lot Help needed standing up from a chair using your arms (e.g., wheelchair or bedside chair)?: A Lot Help needed to walk in hospital room?: Total Help needed climbing 3-5 steps with a railing? : Total 6 Click Score: 9    End of Session Equipment Utilized During Treatment: Gait belt Activity Tolerance: Patient tolerated treatment well Patient left: in bed;with call bell/phone within reach;with bed alarm set Nurse Communication: Mobility status PT Visit Diagnosis: Unsteadiness on feet (R26.81);Difficulty in walking, not elsewhere classified (R26.2)     Time: [id number] PT Time Calculation (min) (ACUTE ONLY): 29 min  Charges:  $Therapeutic Activity: 23-37 mins                     Marisa Severin, PT, DPT Acute Rehabilitation Services Pager [phone] Office [phone]       Marguarite Arbour A Sabra Heck 08/15/2019, 5:02 PM

## 2019-08-15 NOTE — Consult Note (Signed)
Physical Medicine and Rehabilitation Consult Reason for Consult: Dizziness with decreased functional mobility Referring Physician: [facility]   HPI: [name] is a 86 y.o. right-handed male with history of atrial fibrillation maintained on Eliquis, AAA, anemia of chronic disease, BPH, type 2 diabetes mellitus, CAD with stenting as well as permanent pacemaker, hypertension, hyperlipidemia.  Per chart review patient lives alone independent prior to admission with good family support.  Two-level home bed and bath on main level one-step to entry.  Presented for 06/02/2019 with dizziness, mild nausea but no vomiting.  Denied diplopia.  He has reported several falls over the last few days.  Admission chemistries unremarkable.  Cranial CT scan showed hypoattenuation of the right cerebellar hemisphere extending to the cortex most consistent with acute or early subacute infarction.  Patient did not receive TPA.  CT angiogram head and neck no acute vascular findings.  MRI not completed due to permanent pacemaker.  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  Echocardiogram ejection fraction of 65% no wall motion abnormalities.  Neurology follow-up current recommendations are to continue Eliquis.  Patient with unwitnessed fall 08/14/2019 sustaining a laceration on his scalp near his crown.  Follow-up cranial CT scan no acute changes.  Therapy evaluations completed recommendations of physical medicine rehab consult.   Review of Systems  Constitutional: Negative for chills and fever.  HENT: Negative for hearing loss.   Eyes: Negative for blurred vision and double vision.  Respiratory: Negative for cough and shortness of breath.   Cardiovascular: Positive for palpitations.  Gastrointestinal: Positive for constipation. Negative for heartburn and nausea.       GERD  Genitourinary: Positive for urgency.  Negative for dysuria, flank pain and hematuria.  Musculoskeletal: Positive for falls and myalgias.  Skin: Negative for rash.  Neurological: Positive for dizziness.  Psychiatric/Behavioral: The patient has insomnia.   All other systems reviewed and are negative.  Past Medical History:  Diagnosis Date   AAA (abdominal aortic aneurysm) ([place])    Anemia due to chronic blood loss 11/25/2008   Recurrent over the years EGD and colonoscopy x 2 each 2004 and 2010 without cause     Atrial fibrillation ([facility])    BPH (benign prostatic hyperplasia)    reports aprocedure (TURP) remotely in [place] KitchenNo futher f/u w/ urology   CAD (coronary artery disease)    Diabetes mellitus, type 2 ([place])    Diverticulosis    left colon   Erosive gastritis    Fall 08/08/2016   OUTSIDE AT HOME FACIAL TRAUMA    GERD (gastroesophageal reflux disease)    Hyperlipidemia    Hypertension    Hypothyroidism    Insomnia    transient   Osteopenia    dexa 2-11   Vitamin B12 deficiency    Past Surgical History:  Procedure Laterality Date   CARDIAC CATHETERIZATION  01/31/06, 09/25/10, 09-2011   CATARACT EXTRACTION     right   CHOLECYSTECTOMY, LAPAROSCOPIC     COLONOSCOPY     multiple   CORONARY ARTERY BYPASS GRAFT     1999 stents in 2000   ESOPHAGOGASTRODUODENOSCOPY     multiple   GIVENS CAPSULE STUDY N/A 11/04/2012   Procedure: GIVENS CAPSULE STUDY;  Surgeon: Gatha Mayer, MD;  Location: [facility];  Service: Endoscopy;  Laterality: N/A;   inguinal herniorrhaphies     bilateral   LEFT HEART CATHETERIZATION WITH CORONARY ANGIOGRAM N/A 10/04/2011  Procedure: LEFT HEART CATHETERIZATION WITH CORONARY ANGIOGRAM;  Surgeon: Burnell Blanks, MD;  Location: [facility] CATH LAB;  Service: Cardiovascular;  Laterality: N/A;   pacemaker  1980, 1995, 2013   medtronic minix [id number]   PENILE PROSTHESIS IMPLANT  1992   PERMANENT PACEMAKER GENERATOR CHANGE N/A 06/05/2011   Procedure: PERMANENT PACEMAKER  GENERATOR CHANGE;  Surgeon: Evans Lance, MD;  Location: [facility] CATH LAB;  Service: Cardiovascular;  Laterality: N/A;   PROSTATECTOMY     transurethral   right hip replacement     TRANSTHORACIC ECHOCARDIOGRAM  12/2006   Family History  Problem Relation Age of Onset   Alzheimer's disease Mother    Heart failure Father    CVA Father    Coronary artery disease Brother        cabg   Alzheimer's disease Sister    Mental illness Sister    Alzheimer's disease Sister    Other Sister        lung problems   Diabetes Other        several siblings   Prostate cancer Neg Hx    Colon cancer Neg Hx    Esophageal cancer Neg Hx    Stomach cancer Neg Hx    Rectal cancer Neg Hx    Social History:  reports that he has never smoked. He has never used smokeless tobacco. He reports current alcohol use of about 1.0 - 2.0 standard drinks of alcohol per week. He reports that he does not use drugs. Allergies:  Allergies  Allergen Reactions   Ace Inhibitors Cough   Codeine Phosphate Nausea Only   Hydrochlorothiazide W-Triamterene Other (See Comments)    CRAMPING   Medications Prior to Admission  Medication Sig Dispense Refill   apixaban (ELIQUIS) 5 MG TABS tablet Take 1 tablet (5 mg total) by mouth 2 (two) times daily. 180 tablet 1   azelastine (ASTELIN) 0.1 % nasal spray Place 2 sprays into both nostrils at bedtime as needed for rhinitis or allergies. Use in each nostril as directed 30 mL 12   digoxin (LANOXIN) 0.125 MG tablet TAKE 1 TABLET BY MOUTH EVERY OTHER DAY (Patient taking differently: Take 0.125 mg by mouth every other day. ) 45 tablet 6   escitalopram (LEXAPRO) 10 MG tablet Take 1 tablet (10 mg total) by mouth daily. 90 tablet 1   fenofibrate micronized (LOFIBRA) 134 MG capsule Take 1 capsule (134 mg total) by mouth daily. 90 capsule 1   Insulin Glargine (LANTUS SOLOSTAR) 100 UNIT/ML Solostar Pen Inject 38 Units into the skin every morning. 5 pen 11   Insulin Pen  Needle 31G X 6 MM MISC To use w/ Lantus (Patient taking differently: 1 each by Other route daily. For Lantus administration.) 100 each 12   levothyroxine (SYNTHROID) 100 MCG tablet Take 1 tablet (100 mcg total) by mouth daily before breakfast. 90 tablet 1   loratadine (CLARITIN) 10 MG tablet Take 10 mg by mouth daily.     metoprolol tartrate (LOPRESSOR) 25 MG tablet Take 1 tablet (25 mg total) by mouth 2 (two) times daily. 60 tablet 0   mirtazapine (REMERON) 15 MG tablet Take 1 tablet (15 mg total) by mouth at bedtime. 30 tablet 0   nitroGLYCERIN (NITROSTAT) 0.4 MG SL tablet Place 1 tablet (0.4 mg total) under the tongue every 5 (five) minutes x 3 doses as needed for chest pain. Then contact 911 or go to ER 25 tablet 3   pantoprazole (PROTONIX) 40 MG tablet Take 1 tablet (40  mg total) by mouth daily before breakfast. 90 tablet 3   pravastatin (PRAVACHOL) 40 MG tablet Take 2 tablets (80 mg total) by mouth daily. 180 tablet 1   sitaGLIPtin (JANUVIA) 100 MG tablet Take 1 tablet (100 mg total) by mouth daily. 90 tablet 1   ACCU-CHEK FASTCLIX LANCETS MISC Used to check blood sugars 2x daily. (Patient not taking: Reported on 08/12/2019) 102 each 11   Blood Glucose Monitoring Suppl (ACCU-CHEK GUIDE) w/Device KIT 1 Device by Does not apply route daily. (Patient not taking: Reported on 08/12/2019) 1 kit 0   glucose blood (ACCU-CHEK GUIDE) test strip Used to check blood sugars 2x daily. (Patient not taking: Reported on 08/12/2019) 100 each 12   ipratropium (ATROVENT) 0.06 % nasal spray Place 2 sprays into both nostrils 3 (three) times daily. (Patient not taking: Reported on 08/12/2019) 15 mL 6   omeprazole (PRILOSEC) 40 MG capsule Take 1 capsule (40 mg total) by mouth daily. (Patient not taking: Reported on 08/12/2019) 90 capsule 3   vitamin B-12 (CYANOCOBALAMIN) 1000 MCG tablet Take 1 tablet (1,000 mcg total) by mouth daily. (Patient not taking: Reported on 08/12/2019) 90 tablet 2    Home: [place] expects to be discharged to:: Private residence Living Arrangements: Alone Available Help at Discharge: Available PRN/intermittently, Family, Friend(s) Type of Home: House Home Access: Stairs to enter Technical brewer of Steps: 1 Entrance Stairs-Rails: None Home Layout: Two level, Able to live on main level with bedroom/bathroom Bathroom Shower/Tub: Multimedia programmer: [place]: Radio producer - single point(no animals /) Additional Comments: drives to daughter and friend that live within 2 miles but will also drive to grocery store, 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 doctor etc  Functional History: Prior Function Level of Independence: Independent Comments: does not cook, uses microwave for all meals, daughter brings him groceries Functional Status:  Mobility: Bed Mobility Overal bed mobility: Needs Assistance Bed Mobility: Supine to Sit, Sit to Supine Supine to sit: Min assist Sit to supine: Min assist General bed mobility comments: MinA for trunk elevation to upright and LE management back into bed Transfers Overall transfer level: Needs assistance Equipment used: Rolling walker (2 wheeled) Transfers: Sit to/from Stand Sit to Stand: Min assist General transfer comment: cues for safety with RW and to remain in RW  Ambulation/Gait Ambulation/Gait assistance: Min assist Gait Distance (Feet): 65 Feet Assistive device: Rolling walker (2 wheeled) Gait Pattern/deviations: Step-through pattern, Decreased stride length, Narrow base of support, Trunk flexed General Gait Details: MinA for stability, cues for wider BOS, upper trunk control and upward gaze. Pt unable to significantly correct.    ADL: ADL Overall ADL's : Needs assistance/impaired Eating/Feeding: Supervision/ safety, Sitting Grooming: Supervision/safety, Sitting Upper Body Bathing: Min guard, Sitting Lower Body Bathing: Moderate assistance, Sit to/from stand Lower Body Bathing Details  (indicate cue type and reason): able to wash min (A) sitting Upper Body Dressing : Min guard, Sitting Lower Body Dressing: Moderate assistance, Sit to/from stand Lower Body Dressing Details (indicate cue type and reason): attempting to transfer with pants falling. pt says my pants are off and continues to attempt transfer Toilet Transfer: Minimal assistance, BSC, RW, Stand-pivot Functional mobility during ADLs: Minimal assistance, Rolling walker General ADL Comments: pt needed safety cues throughout session. pt remained seated with cues wihtout need to have it repeated.   Cognition: Cognition Overall Cognitive Status: Impaired/Different from baseline Orientation Level: Oriented to person Cognition Arousal/Alertness: Awake/alert Behavior During Therapy: WFL for tasks assessed/performed Overall Cognitive Status: Impaired/Different from baseline Area  of Impairment: Safety/judgement Safety/Judgement: Decreased awareness of safety General Comments: pt attempting to exit bed on arrival with wet pants. pt appropriately asking to take off pant due to wetness. pt asking appropriate questions  Blood pressure 115/64, pulse 62, temperature 98.9 F (37.2 C), temperature source Oral, resp. rate 18, SpO2 98 %.  Physical Exam  General: Alert and oriented x2, No apparent distress HEENT: Head is normocephalic, atraumatic, PERRLA, EOMI, sclera anicteric, oral mucosa pink and moist, dentition intact, ext ear canals clear,  Neck: Supple without JVD or lymphadenopathy Heart: Reg rate and rhythm. No murmurs rubs or gallops Chest: CTA bilaterally without wheezes, rales, or rhonchi; no distress Abdomen: Soft, non-tender, non-distended, bowel sounds positive. Extremities: No clubbing, cyanosis, or edema. Pulses are 2+ Skin: Clean and intact without signs of breakdown Neuro: Patient is alert. Exhibits decreased attention and recall.  He does follow simple commands.  Musculoskeletal: 4-/5 strength throughout  upper extremities. 0/5 bilateral hip flexion and knee extension, limited by fatigue. 4-/5 in bilateral DF/PF.  Psych: Pt's affect is appropriate. Pt is cooperative  Results for orders placed or performed during the hospital encounter of 08/12/19 (from the past 24 hour(s))  Glucose, capillary     Status: Abnormal   Collection Time: 08/14/19 12:11 PM  Result Value Ref Range   Glucose-Capillary 212 (H) 70 - 99 mg/dL  Glucose, capillary     Status: Abnormal   Collection Time: 08/14/19  4:17 PM  Result Value Ref Range   Glucose-Capillary 120 (H) 70 - 99 mg/dL  Glucose, capillary     Status: Abnormal   Collection Time: 08/14/19  9:58 PM  Result Value Ref Range   Glucose-Capillary 133 (H) 70 - 99 mg/dL  Glucose, capillary     Status: Abnormal   Collection Time: 08/15/19  6:09 AM  Result Value Ref Range   Glucose-Capillary 158 (H) 70 - 99 mg/dL   DG Chest 1 View  Result Date: 08/14/2019 CLINICAL DATA:  Pain following fall EXAM: CHEST  1 VIEW COMPARISON:  July 07, 2017 FINDINGS: The lungs are clear. There is cardiomegaly with pulmonary vascularity within normal limits. Pacemaker leads attached to the right heart, stable. Patient is status post coronary artery bypass grafting. No pneumothorax. No bone lesions. IMPRESSION: Stable cardiomegaly. Postoperative changes. Stable pacemaker lead placements. Lungs clear. Electronically Signed   By: Lowella Grip III M.D.   On: 08/14/2019 11:03   CT HEAD WO CONTRAST  Result Date: 08/14/2019 CLINICAL DATA:  Follow-up head trauma.  Head trauma. EXAM: CT HEAD WITHOUT CONTRAST TECHNIQUE: Contiguous axial images were obtained from the base of the skull through the vertex without intravenous contrast. COMPARISON:  Head CT from 2 days ago FINDINGS: Brain: Acute to early subacute right PICA territory infarct with stable extent of low-density. Effacement of the lower fourth ventricle without hydrocephalus or increasing ventricular volume. Along the lower aspect  of the infarct is vague high-density that maintains cerebellar architecture. Extensive chronic small vessel ischemia with confluent low-density in the deep white matter and remote lacunar infarcts at the basal ganglia. No hemorrhage. Vascular: Atherosclerotic calcification Skull: Unremarkable Sinuses/Orbits: Bilateral cataract resection IMPRESSION: Right PICA territory infarct with petechial hemorrhage and fourth ventricular narrowing. No hydrocephalus or interval change. Electronically Signed   By: Monte Fantasia M.D.   On: 08/14/2019 07:38   ECHOCARDIOGRAM COMPLETE  Result Date: 08/13/2019    ECHOCARDIOGRAM REPORT   Patient Name:   CAEDEN FOOTS [facility] Date of Exam: 08/13/2019 Medical Rec #:  [id number]   Height:  67.0 in Accession #:    [id number]  Weight:       148.0 lb Date of Birth:  Mar 04, 1926  BSA:          1.779 m Patient Age:    35 years    BP:           129/64 mmHg Patient Gender: M           HR:           59 bpm. Exam Location:  Inpatient Procedure: 2D Echo Indications:    Stroke 434.91 / I163.9  History:        Patient has prior history of Echocardiogram examinations, most                 recent 08/09/2016. Pacemaker, Arrythmias:Atrial Fibrillation;                 Risk Factors:Diabetes, Hypertension and Dyslipidemia.  Sonographer:    Vikki Ports Turrentine Referring Phys: [place]  Sonographer Comments: Suboptimal subcostal window. Image acquisition challenging due to patient body habitus and Image acquisition challenging due to uncooperative patient. IMPRESSIONS  1. Normal LV systolic function; moderate LVH; mild AI; mild LAE.  2. Left ventricular ejection fraction, by estimation, is 60 to 65%. The left ventricle has normal function. The left ventricle has no regional wall motion abnormalities. There is moderate left ventricular hypertrophy. Left ventricular diastolic parameters are indeterminate.  3. Right ventricular systolic function is normal. The right ventricular size is normal.  4. Left  atrial size was mildly dilated.  5. The mitral valve is normal in structure. Trivial mitral valve regurgitation. No evidence of mitral stenosis.  6. The aortic valve is tricuspid. Aortic valve regurgitation is mild. Mild to moderate aortic valve sclerosis/calcification is present, without any evidence of aortic stenosis. FINDINGS  Left Ventricle: Left ventricular ejection fraction, by estimation, is 60 to 65%. The left ventricle has normal function. The left ventricle has no regional wall motion abnormalities. The left ventricular internal cavity size was normal in size. There is  moderate left ventricular hypertrophy. Left ventricular diastolic parameters are indeterminate. Right Ventricle: The right ventricular size is normal. Right ventricular systolic function is normal. Left Atrium: Left atrial size was mildly dilated. Right Atrium: Right atrial size was normal in size. Pericardium: There is no evidence of pericardial effusion. Mitral Valve: The mitral valve is normal in structure. Normal mobility of the mitral valve leaflets. Mild mitral annular calcification. Trivial mitral valve regurgitation. No evidence of mitral valve stenosis. Tricuspid Valve: The tricuspid valve is normal in structure. Tricuspid valve regurgitation is trivial. No evidence of tricuspid stenosis. Aortic Valve: The aortic valve is tricuspid. Aortic valve regurgitation is mild. Mild to moderate aortic valve sclerosis/calcification is present, without any evidence of aortic stenosis. Aortic valve mean gradient measures 4.0 mmHg. Aortic valve peak gradient measures 7.8 mmHg. Aortic valve area, by VTI measures 2.01 cm. Pulmonic Valve: The pulmonic valve was not well visualized. Pulmonic valve regurgitation is not visualized. No evidence of pulmonic stenosis. Aorta: The aortic root is normal in size and structure. Venous: The inferior vena cava was not well visualized.  Additional Comments: Normal LV systolic function; moderate LVH; mild AI;  mild LAE. A pacer wire is visualized.  LEFT VENTRICLE PLAX 2D LVIDd:         4.70 cm LVIDs:         3.20 cm LV PW:         1.40 cm LV IVS:  1.40 cm LVOT diam:     2.00 cm LV SV:         47 LV SV Index:   27 LVOT Area:     3.14 cm  RIGHT VENTRICLE TAPSE (M-mode): 1.2 cm LEFT ATRIUM             Index LA diam:        5.50 cm 3.09 cm/m LA Vol (A2C):   61.6 ml 34.62 ml/m LA Vol (A4C):   73.8 ml 41.48 ml/m LA Biplane Vol: 69.3 ml 38.95 ml/m  AORTIC VALVE AV Area (Vmax):    1.68 cm AV Area (Vmean):   1.83 cm AV Area (VTI):     2.01 cm AV Vmax:           140.00 cm/s AV Vmean:          90.000 cm/s AV VTI:            0.236 m AV Peak Grad:      7.8 mmHg AV Mean Grad:      4.0 mmHg LVOT Vmax:         74.80 cm/s LVOT Vmean:        52.500 cm/s LVOT VTI:          0.151 m LVOT/AV VTI ratio: 0.64  AORTA Ao Root diam: 3.20 cm MITRAL VALVE               TRICUSPID VALVE MV Area (PHT): 3.50 cm    TR Peak grad:   15.1 mmHg MV Decel Time: 217 msec    TR Vmax:        194.00 cm/s MV E velocity: 74.80 cm/s MV A velocity: 63.30 cm/s  SHUNTS MV E/A ratio:  1.18        Systemic VTI:  0.15 m                            Systemic Diam: 2.00 cm Kirk Ruths MD Electronically signed by Kirk Ruths MD Signature Date/Time: 08/13/2019/1:28:37 PM    Final    DG HIP UNILAT WITH PELVIS 2-3 VIEWS RIGHT  Result Date: 08/14/2019 CLINICAL DATA:  Pain following fall EXAM: DG HIP (WITH OR WITHOUT PELVIS) 2-3V RIGHT COMPARISON:  None. FINDINGS: Frontal pelvis as well as frontal and lateral right hip images were obtained. Bones are osteoporotic. There is a total hip replacement on the right with prosthetic components well-seated. No fracture or dislocation. No erosive change. There is slight narrowing of the left hip joint. Penile prosthesis present. IMPRESSION: Status post total hip replacement on the right with prosthetic components well-seated. No fracture or dislocation. Mild narrowing left hip joint. Electronically Signed   By: Lowella Grip III M.D.   On: 08/14/2019 11:02   Assessment/Plan: Right PICA territory infarct: --Discussed extensively with 2 daughters at bedside and 1 daughter by phone, and separately with patient. Daughters are unable to care for their father at home and would like to pursue skilled nursing facility. They would like to meet with SW to discuss possible facility options, cost of care, as well as any home services that could be provided if he were to improve and if they could care for him at home. I will reach out to SW to connect with family.   --Daughters would like palliative care to be involved to further discuss DNR order with their father as they are sure he does not understand what it entails. May  benefit from home hospice discussions as well as father would very much prefer return to home rather than SNF.   --Discussed sundowning and advised that family bring in pictures of themselves to place at bedside to help comfort and reorient patient.  --Thank you for this consult. Please re-consult as needed.   Lavon Paganini Angiulli, PA-C 08/15/2019   I have personally performed a face to face diagnostic evaluation, including, but not limited to relevant history and physical exam findings, of this patient and developed relevant assessment and plan.  Additionally, I have reviewed and concur with the physician assistant's documentation above.  Leeroy Cha, MD

## 2019-08-16 DIAGNOSIS — I63441 Cerebral infarction due to embolism of right cerebellar artery: Principal | ICD-10-CM

## 2019-08-16 LAB — GLUCOSE, CAPILLARY
Glucose-Capillary: 156 mg/dL — ABNORMAL HIGH (ref 70–99)
Glucose-Capillary: 256 mg/dL — ABNORMAL HIGH (ref 70–99)
Glucose-Capillary: 127 mg/dL — ABNORMAL HIGH (ref 70–99)
Glucose-Capillary: 173 mg/dL — ABNORMAL HIGH (ref 70–99)

## 2019-08-16 NOTE — Evaluation (Addendum)
Speech Language Pathology Evaluation Patient Details Name: [name] MRN: [id number] DOB: Dec 04, 1925 Today's Date: 08/16/2019 Time: [id number] SLP Time Calculation (min) (ACUTE ONLY): 18 min  Problem List:  Patient Active Problem List   Diagnosis Date Noted  . 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 Acute CVA (cerebrovascular accident) ([place]) 08/13/2019  . 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 Depression 03/12/2017  . Rotator cuff tear, right 08/18/2016  . HLD (hyperlipidemia) 08/18/2016  . Nasal bone fracture 08/18/2016  . SAH (subarachnoid hemorrhage) ([place]) 08/08/2016  . PCP NOTES >>>>>>>>>>>>>>>>>>>> 09/28/2015  . Vasomotor rhinitis 09/14/2014  . Hiatal hernia 01/28/2013  . Erosive gastritis 01/28/2013  . BPH (benign prostatic hyperplasia) 06/07/2009  . Osteopenia 06/07/2009  . Anemia due to chronic blood loss 11/25/2008  . Vitamin B 12 deficiency 06/01/2008  . HTN (hypertension) 06/01/2008  . PACEMAKER, PERMANENT 06/01/2008  . GERD 03/31/2007  . Hypothyroidism 01/26/2007  . DM (diabetes mellitus) with complications ([place]) 0000000  . Permanent atrial fibrillation ([place]) 01/26/2007  . Dyslipidemia 10/30/2006  . Coronary artery disease involving native coronary artery of native heart without angina pectoris 10/02/2006  . HIP REPLACEMENT, RIGHT, HX OF 02/04/2003   Past Medical History:  Past Medical History:  Diagnosis Date  . AAA (abdominal aortic aneurysm) ([place])   . Anemia due to chronic blood loss 11/25/2008   Recurrent over the years EGD and colonoscopy x 2 each 2004 and 2010 without cause    . Atrial fibrillation ([place])   . BPH (benign prostatic hyperplasia)    reports aprocedure (TURP) remotely in [place].Marland KitchenNo futher f/u w/ urology  . CAD (coronary artery disease)   . Diabetes mellitus, type 2 ([place])   . Diverticulosis    left colon  . Erosive gastritis   . Fall 08/08/2016   OUTSIDE  AT HOME FACIAL TRAUMA   . GERD (gastroesophageal reflux disease)   . Hyperlipidemia   . Hypertension   . Hypothyroidism   . Insomnia    transient  . Osteopenia    dexa 2-11  . Vitamin B12 deficiency    Past Surgical History:  Past Surgical History:  Procedure Laterality Date  . CARDIAC CATHETERIZATION  01/31/06, 09/25/10, 09-2011  . CATARACT EXTRACTION     right  . CHOLECYSTECTOMY, LAPAROSCOPIC    . COLONOSCOPY     multiple  . CORONARY ARTERY BYPASS GRAFT     1999 stents in 2000  . ESOPHAGOGASTRODUODENOSCOPY     multiple  . GIVENS CAPSULE STUDY N/A 11/04/2012   Procedure: GIVENS CAPSULE STUDY;  Surgeon: Gatha Mayer, MD;  Location: [facility] ENDOSCOPY;  Service: Endoscopy;  Laterality: N/A;  . inguinal herniorrhaphies     bilateral  . LEFT HEART CATHETERIZATION WITH CORONARY ANGIOGRAM N/A 10/04/2011   Procedure: LEFT HEART CATHETERIZATION WITH CORONARY ANGIOGRAM;  Surgeon: Burnell Blanks, MD;  Location: [facility] CATH LAB;  Service: Cardiovascular;  Laterality: N/A;  . pacemaker  [place], 2013   medtronic minix [id number]  . PENILE PROSTHESIS IMPLANT  1992  . PERMANENT PACEMAKER GENERATOR CHANGE N/A 06/05/2011   Procedure: PERMANENT PACEMAKER GENERATOR CHANGE;  Surgeon: Evans Lance, MD;  Location: [facility] CATH LAB;  Service: Cardiovascular;  Laterality: N/A;  . PROSTATECTOMY     transurethral  . right hip replacement    . TRANSTHORACIC ECHOCARDIOGRAM  12/2006   HPI:  86 yo male s/p fall with dizziness MRI fairly large cerebellar infarct. PMH afib on eliquis, AAA, CAD, DM,  Diverticulosis, Fall 2018 with facial trauma, HTN   Assessment / Plan / Recommendation Clinical Impression   Pt participated in a limited cognitive evaluation, impacted by pt's drowsiness as well as low frustration threshold, saying that he did not want to finish testing (attempted completing the SLUMS). Pt had difficulty with immediate recall/repetition of words and showed decreased intellectual awareness of current  physical and cognitive presentation. His articulation is imprecise, moderately reducing intelligibility at the phrase level. His daughters express concern over decreasing ability to maintain cognitive tasks at home PTA with an additional, acute decline this admission. Of note, they also question the impact of current medications such as ativan. They are in agreement to continue SLP service acutely. Also recommend SLP f/u and 24/7 supervision post-discharge.    SLP Assessment  SLP Recommendation/Assessment: Patient needs continued Speech Lanaguage Pathology Services SLP Visit Diagnosis: Cognitive communication deficit (R41.841)    Follow Up Recommendations  Inpatient Rehab    Frequency and Duration min 2x/week  2 weeks      SLP Evaluation Cognition  Overall Cognitive Status: Impaired/Different from baseline Arousal/Alertness: Lethargic Orientation Level: Oriented to person;Oriented to time Attention: Sustained Sustained Attention: Impaired Sustained Attention Impairment: Verbal basic Memory: Impaired Memory Impairment: Storage deficit Awareness: Impaired Awareness Impairment: Intellectual impairment Behaviors: Poor frustration tolerance Safety/Judgment: Impaired       Comprehension  Auditory Comprehension Overall Auditory Comprehension: (needs more assessment)    Expression Expression Primary Mode of Expression: Verbal Verbal Expression Overall Verbal Expression: (needs more assessment)   Oral / Motor  Motor Speech Overall Motor Speech: Impaired Respiration: Within functional limits Phonation: Normal Resonance: Within functional limits Articulation: Impaired Level of Impairment: Phrase Intelligibility: Intelligibility reduced Phrase: 50-74% accurate   GO                     Osie Bond., M.A. [place] [facility] Pager [phone] Office [phone]  08/16/2019, 11:47 AM

## 2019-08-16 NOTE — NC FL2 (Signed)
[place] LEVEL OF CARE SCREENING TOOL     IDENTIFICATION  Patient Name: [name] Birthdate: [birth year] Sex: male Admission Date (Current Location): 08/12/2019  [facility] [place] and [place] Number:  Herbalist and Address:  [facility], [place] [address], [place],  [address]      Provider Number: [id number]  Attending Physician Name and Address:  Desiree Hane, MD  Relative Name and Phone Number:       Current Level of Care: Hospital Recommended Level of Care: [place] Prior Approval Number:    Date Approved/Denied:   PASRR Number: [id number]  Discharge Plan: SNF    Current Diagnoses: Patient Active Problem List   Diagnosis Date Noted  . 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 Acute CVA (cerebrovascular accident) ([place]) 08/13/2019  . 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 Depression 03/12/2017  . Rotator cuff tear, right 08/18/2016  . HLD (hyperlipidemia) 08/18/2016  . Nasal bone fracture 08/18/2016  . SAH (subarachnoid hemorrhage) ([place]) 08/08/2016  . PCP NOTES >>>>>>>>>>>>>>>>>>>> 09/28/2015  . Vasomotor rhinitis 09/14/2014  . Hiatal hernia 01/28/2013  . Erosive gastritis 01/28/2013  . BPH (benign prostatic hyperplasia) 06/07/2009  . Osteopenia 06/07/2009  . Anemia due to chronic blood loss 11/25/2008  . Vitamin B 12 deficiency 06/01/2008  . HTN (hypertension) 06/01/2008  . PACEMAKER, PERMANENT 06/01/2008  . GERD 03/31/2007  . Hypothyroidism 01/26/2007  . DM (diabetes mellitus) with complications ([place]) 0000000  . Permanent atrial fibrillation ([place]) 01/26/2007  . Dyslipidemia 10/30/2006  . Coronary artery disease involving native coronary artery of native heart without angina pectoris 10/02/2006  . HIP REPLACEMENT, RIGHT, HX OF 02/04/2003    Orientation RESPIRATION BLADDER Height & Weight     Self,  Situation, Time, Place  Normal Incontinent Weight:   Height:     BEHAVIORAL SYMPTOMS/MOOD NEUROLOGICAL BOWEL NUTRITION STATUS      Incontinent Diet(see DC Summary)  AMBULATORY STATUS COMMUNICATION OF NEEDS Skin   Extensive Assist Verbally Skin abrasions(head laceration, adhesive strips)                       Personal Care Assistance Level of Assistance  Bathing, Feeding, Dressing Bathing Assistance: Maximum assistance Feeding assistance: Limited assistance Dressing Assistance: Maximum assistance     Functional Limitations Info  Hearing   Hearing Info: Impaired      SPECIAL CARE FACTORS FREQUENCY  PT (By licensed PT), OT (By licensed OT)     PT Frequency: 5x/wk OT Frequency: 5x/wk            Contractures Contractures Info: Not present    Additional Factors Info  Code Status, Allergies, Psychotropic, Insulin Sliding Scale Code Status Info: Full Allergies Info: Ace Inhibitors, Codeine Phosphate, Hydrochlorothiazide W-triamterene Psychotropic Info: Lexapro 10mg  daily, Remeron 15mg  daily at bed Insulin Sliding Scale Info: 0-9 units 3x/day with meals; 0-5 units daily at bed       Current Medications (08/16/2019):  This is the current hospital active medication list Current Facility-Administered Medications  Medication Dose Route Frequency Provider Last Rate Last Admin  . acetaminophen (TYLENOL) tablet 650 mg  650 mg Oral Q4H PRN Caren Griffins, MD   650 mg at 08/15/19 [id number]   Or  . acetaminophen (TYLENOL) 160 MG/5ML solution 650 mg  650 mg Per Tube Q4H PRN Caren Griffins, MD       Or  .  acetaminophen (TYLENOL) suppository 650 mg  650 mg Rectal Q4H PRN Caren Griffins, MD      . apixaban (ELIQUIS) tablet 5 mg  5 mg Oral BID Burnetta Sabin L, NP   5 mg at 08/16/19 1150  . azelastine (ASTELIN) 0.1 % nasal spray 2 spray  2 spray Each Nare QHS PRN Caren Griffins, MD      . digoxin (LANOXIN) tablet 0.125 mg  0.125 mg Oral Lavonda Jumbo, MD   0.125 mg at  08/16/19 1151  . escitalopram (LEXAPRO) tablet 10 mg  10 mg Oral Daily Caren Griffins, MD   10 mg at 08/16/19 1151  . fenofibrate tablet 54 mg  54 mg Oral Daily Caren Griffins, MD   54 mg at 08/16/19 1150  . insulin aspart (novoLOG) injection 0-5 Units  0-5 Units Subcutaneous QHS Caren Griffins, MD   5 Units at 08/13/19 0003  . insulin aspart (novoLOG) injection 0-9 Units  0-9 Units Subcutaneous TID WC Caren Griffins, MD   5 Units at 08/16/19 1152  . levothyroxine (SYNTHROID) tablet 100 mcg  100 mcg Oral QAC breakfast Caren Griffins, MD   100 mcg at 08/16/19 1151  . linagliptin (TRADJENTA) tablet 5 mg  5 mg Oral Daily Caren Griffins, MD   5 mg at 08/16/19 1151  . loratadine (CLARITIN) tablet 10 mg  10 mg Oral Daily Caren Griffins, MD   10 mg at 08/16/19 1150  . LORazepam (ATIVAN) injection 0.25 mg  0.25 mg Intravenous Q6H PRN Annita Brod, MD   0.25 mg at 08/15/19 2059  . metoprolol tartrate (LOPRESSOR) tablet 12.5 mg  12.5 mg Oral BID Caren Griffins, MD   12.5 mg at 08/16/19 1151  . mirtazapine (REMERON) tablet 15 mg  15 mg Oral QHS Caren Griffins, MD   15 mg at 08/15/19 2225  . pantoprazole (PROTONIX) EC tablet 40 mg  40 mg Oral QAC breakfast Caren Griffins, MD   40 mg at 08/16/19 1151  . pravastatin (PRAVACHOL) tablet 80 mg  80 mg Oral Daily Caren Griffins, MD   80 mg at 08/16/19 1150  . senna-docusate (Senokot-S) tablet 1 tablet  1 tablet Oral QHS PRN Caren Griffins, MD   1 tablet at 08/15/19 0901     Discharge Medications: Please see discharge summary for a list of discharge medications.  Relevant Imaging Results:  Relevant Lab Results:   Additional Information SS#: [id number]  Geralynn Ochs, LCSW

## 2019-08-16 NOTE — TOC Initial Note (Signed)
Transition of Care [facility]) - Initial/Assessment Note    Patient Details  Name: [name] MRN: [id number] Date of Birth: 11/10/1925  Transition of Care [facility]) CM/SW Contact:    Geralynn Ochs, LCSW Phone Number: 08/16/2019, 3:05 PM  Clinical Narrative:     CSW alerted by Medical Team that patient's family interested in SNF, preference for [employer] [employer]. CSW completed referral and confirmed bed offer at [facility], then met with patient and daughters at bedside. CSW received permission from patient to talk to daughters, and daughters requested that CSW meet with them separate from the patient. CSW updated daughters on bed offer at [place], and discussed insurance coverage and expectations if patient were to need long term care afterwards. CSW obtained information from [place] that patient's insurance coverage is managed through regular [employer], so CSW asked [place] to [employer] authorization request. CSW to follow.              Expected Discharge Plan: Skilled Nursing Facility Barriers to Discharge: Continued Medical Work up, Ship broker   Patient Goals and CMS Choice Patient states their goals for this hospitalization and ongoing recovery are:: to be able to return home CMS Medicare.gov Compare Post Acute Care list provided to:: Patient Represenative (must comment) Choice offered to / list presented to : Adult Children  Expected Discharge Plan and Services Expected Discharge Plan: [place] Choice: [place] Living arrangements for the past 2 months: Single Family Home                                      Prior Living Arrangements/Services Living arrangements for the past 2 months: Single Family Home Lives with:: Self Patient language and need for interpreter reviewed:: No Do you feel safe going back to the place where you live?: Yes      Need for Family Participation in Patient Care: Yes (Comment) Care  giver support system in place?: No (comment) Current home services: DME Criminal Activity/Legal Involvement Pertinent to Current Situation/Hospitalization: No - Comment as needed  Activities of Daily Living      Permission Sought/Granted Permission sought to share information with : Facility Sport and exercise psychologist, Family Supports Permission granted to share information with : Yes, Verbal Permission Granted  Share Information with NAME: Kerri Perches, Jacquelin Hawking, [place] granted to share info w AGENCY: Database administrator granted to share info w Relationship: Daughters     Emotional Assessment Appearance:: Appears stated age Attitude/Demeanor/Rapport: Engaged Affect (typically observed): Pleasant Orientation: : Oriented to Self, Oriented to Place, Oriented to Situation Alcohol / Substance Use: Not Applicable Psych Involvement: No (comment)  Admission diagnosis:  Vertigo [R42] CVA (cerebral vascular accident) ([place]) [I63.9] Cerebrovascular accident (CVA), unspecified mechanism ([place]) [I63.9] Acute CVA (cerebrovascular accident) [facility]) [I63.9] Patient Active Problem List   Diagnosis Date Noted  . 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 Acute CVA (cerebrovascular accident) ([place]) 08/13/2019  . 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 Depression 03/12/2017  . Rotator cuff tear, right 08/18/2016  . HLD (hyperlipidemia) 08/18/2016  . Nasal bone fracture 08/18/2016  . SAH (subarachnoid hemorrhage) ([place]) 08/08/2016  . PCP NOTES >>>>>>>>>>>>>>>>>>>> 09/28/2015  . Vasomotor rhinitis 09/14/2014  . Hiatal hernia 01/28/2013  . Erosive gastritis 01/28/2013  . BPH (benign prostatic hyperplasia) 06/07/2009  .  Osteopenia 06/07/2009  . Anemia due to chronic blood loss 11/25/2008  . Vitamin B 12 deficiency 06/01/2008  . HTN (hypertension) 06/01/2008  . PACEMAKER, PERMANENT 06/01/2008  . GERD  03/31/2007  . Hypothyroidism 01/26/2007  . DM (diabetes mellitus) with complications ([place]) 16/38/4536  . Permanent atrial fibrillation ([place]) 01/26/2007  . Dyslipidemia 10/30/2006  . Coronary artery disease involving native coronary artery of native heart without angina pectoris 10/02/2006  . HIP REPLACEMENT, RIGHT, HX OF 02/04/2003   PCP:  Colon Branch, MD Pharmacy:   [facility], [place] [address] Phone: [phone] Fax: [phone]     Social Determinants of Health (SDOH) Interventions    Readmission Risk Interventions No flowsheet data found.

## 2019-08-16 NOTE — Progress Notes (Signed)
[facility]  PROGRESS NOTE  [name] [id number] DOB: [birth year] DOA: 08/12/2019 PCP: Colon Branch, MD Admit date - 08/12/2019   Admitting Physician Annita Brod, MD  Outpatient Primary MD for the patient is Colon Branch, MD  LOS - 3 Brief Narrative   [name] is a 86 y.o. year old male with medical history significant for atrial fibrillation on Eliquis, HTN HLD, type 2 diabetes, CHB with pacemaker, CAD illnesses CABG), AAA who presented on 08/12/2019 with complaints of dizziness for the past weeks that worsened over the past few days as well as recurrent falls at home and was admitted with working diagnosis of acute CVA.    In the ED he was afebrile, tachypneic with respiratory range of 23-26, heart rate 59-32 otherwise hemodynamically stable.  Covid test was negative. Had slight leukocytosis of 10.7.  Was found to have large hypoattenuation of right cerebellar area on CT head.  Patient was unable to obtain MRI due to presence of pacemaker.  Neurology was consulted, CTA head and neck consistent with right PCA territory infarct.  TTE showed preserved EF with no source of embolus, LDL 84, A1c 7.6.  She recommended continuing Eliquis in addition to aspirin and statin on discharge.  In speaking with daughters, there is concern patient has not been adherent to Eliquis therapy due to intermittent confusion suggestive of underlying dementia.  Patient currently not competent to make decisions based off mental status, family agreeable to SNF.     Subjective  Today patient has no acute complaints.  No shortness of breath, no chest pain.  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 -PT recommends CIR, family elects for skilled nursing facility -continue Eliquis, no additional aspirin -Continue statin  Senile dementia without behavioral disturbance.  Ongoing for  quite some time, with worsening confusion over the past year per family.  Family elects for skilled nursing facility on discharge -Social worker consulted, discussed with family.  Fall from bed (4/3) with scalp laceration.  CT head unremarkable, hip x-ray with no signs of fracture -Up with assistance only -Delirium precautions  Type 2 diabetes, A1c 7.6 FBG at goal  -Hold oral hypoglycemic regimen in hospital -Holding home Lantus 38 units -Sliding scale as needed, monitor CBGs  HTN, allow for permissive hypertension in setting of subacute CVA-monitor blood pressure goal normotensive  Permanent atrial fibrillation status post pacemaker for complete heart block, currently rate controlled -continue home Eliquis, digoxin, Lopressor -monitor on telemetry  BPH  HLD, LDL at 84 -Pravachol 80 mg  Mood disorder, stable -Continue Lexapro  Hypothyroidism, stable -Home Synthroid 100 mcg agraffe CAD.  No anginal symptoms.  Troponin unremarkable. -Continue Lopressor, statin     Family Communication  : Updated daughters Lynelle Smoke, Kerri Perches at bedside)  Code Status : Full  Disposition Plan  :  Patient is from home. Anticipated d/c date: 2 to 3 days. Barriers to d/c or necessity for inpatient status:  Patient medically stable, currently awaiting social worker to arrange SNF based off family's preference Consults  : Neurology  Procedures  : TTE, 08/13/2019 60-65%  DVT Prophylaxis  : Eliquis  Lab Results  Component Value Date   PLT 182 08/12/2019    Diet :  Diet Order            Diet heart healthy/carb modified Room service  appropriate? Yes; Fluid consistency: Thin  Diet effective now               Inpatient Medications Scheduled Meds: . apixaban  5 mg Oral BID  . digoxin  0.125 mg Oral QODAY  . escitalopram  10 mg Oral Daily  . fenofibrate  54 mg Oral Daily  . insulin aspart  0-5 Units Subcutaneous QHS  . insulin aspart  0-9 Units Subcutaneous TID WC  . levothyroxine   100 mcg Oral QAC breakfast  . linagliptin  5 mg Oral Daily  . loratadine  10 mg Oral Daily  . metoprolol tartrate  12.5 mg Oral BID  . mirtazapine  15 mg Oral QHS  . pantoprazole  40 mg Oral QAC breakfast  . pravastatin  80 mg Oral Daily   Continuous Infusions: PRN Meds:.acetaminophen **OR** acetaminophen (TYLENOL) oral liquid 160 mg/5 mL **OR** acetaminophen, azelastine, LORazepam, senna-docusate  Antibiotics  :   Anti-infectives (From admission, onward)   None       Objective   Vitals:   08/16/19 0508 08/16/19 0749 08/16/19 1123 08/16/19 1639  BP: (!) 128/55 138/75 121/64 (!) 129/56  Pulse: 63 73 60 62  Resp: 17 15 18 20   Temp: 98.7 F (37.1 C) 98.1 F (36.7 C) 98.2 F (36.8 C) 97.7 F (36.5 C)  TempSrc: Oral Oral Oral Oral  SpO2: 98% 96% 98% 98%    SpO2: 98 %  Wt Readings from Last 3 Encounters:  05/20/19 67.1 kg  09/16/18 73.3 kg  07/01/18 74.6 kg     Intake/Output Summary (Last 24 hours) at 08/16/2019 1711 Last data filed at 08/16/2019 1352 Gross per 24 hour  Intake --  Output 602 ml  Net -602 ml    Physical Exam:  Awake Alert, oriented to self, place, time, irritable No new F.N deficits,  [place].AT, Normal respiratory effort on room air, CTAB Irregularly irregular, normal rate,No Gallops,Rubs or new Murmurs,  +ve B.Sounds, Abd Soft, No tenderness, No rebound, guarding or rigidity. No Cyanosis, No new Rash or bruise     I have personally reviewed the following:   Data Reviewed:  CBC Recent Labs  Lab 08/12/19 1320  WBC 10.7*  HGB 14.5  HCT 43.6  PLT 182  MCV 96.0  MCH 31.9  MCHC 33.3  RDW 13.3    Chemistries  Recent Labs  Lab 08/12/19 1320 08/12/19 1520  NA 135  --   K 4.4  --   CL 103  --   CO2 22  --   GLUCOSE 244*  --   BUN 18  --   CREATININE 1.23  --   CALCIUM 9.0  --   MG  --  2.0   ------------------------------------------------------------------------------------------------------------------ No results for input(s):  CHOL, HDL, LDLCALC, TRIG, CHOLHDL, LDLDIRECT in the last 72 hours.  Lab Results  Component Value Date   HGBA1C 7.6 (H) 08/12/2019   ------------------------------------------------------------------------------------------------------------------ No results for input(s): TSH, T4TOTAL, T3FREE, THYROIDAB in the last 72 hours.  Invalid input(s): FREET3 ------------------------------------------------------------------------------------------------------------------ No results for input(s): VITAMINB12, FOLATE, FERRITIN, TIBC, IRON, RETICCTPCT in the last 72 hours.  Coagulation profile No results for input(s): INR, PROTIME in the last 168 hours.  No results for input(s): DDIMER in the last 72 hours.  Cardiac Enzymes No results for input(s): CKMB, TROPONINI, MYOGLOBIN in the last 168 hours.  Invalid input(s): CK ------------------------------------------------------------------------------------------------------------------ No results found for: BNP  Micro Results Recent Results (from the past 240 hour(s))  SARS CORONAVIRUS 2 (TAT 6-24 HRS) Nasopharyngeal  Nasopharyngeal Swab     Status: None   Collection Time: 08/12/19  6:55 PM   Specimen: Nasopharyngeal Swab  Result Value Ref Range Status   SARS Coronavirus 2 NEGATIVE NEGATIVE Final    Comment: (NOTE) SARS-CoV-2 target nucleic acids are NOT DETECTED. The SARS-CoV-2 RNA is generally detectable in upper and lower respiratory specimens during the acute phase of infection. Negative results do not preclude SARS-CoV-2 infection, do not rule out co-infections with other pathogens, and should not be used as the sole basis for treatment or other patient management decisions. Negative results must be combined with clinical observations, patient history, and epidemiological information. The expected result is Negative. Fact Sheet for Patients: SugarRoll.be Fact Sheet for Healthcare  Providers: https://www.woods-mathews.com/ This test is not yet approved or cleared by the [place] FDA and  has been authorized for detection and/or diagnosis of SARS-CoV-2 by FDA under an Emergency Use Authorization (EUA). This EUA will remain  in effect (meaning this test can be used) for the duration of the COVID-19 declaration under Section 56 4(b)(1) of the Act, 21 U.S.C. section 360bbb-3(b)(1), unless the authorization is terminated or revoked sooner. Performed at [facility], [place] [address], [place], [place] [address]     Radiology Reports CT ANGIO HEAD W OR WO CONTRAST  Result Date: 08/13/2019 CLINICAL DATA:  Stroke follow-up EXAM: CT ANGIOGRAPHY HEAD AND NECK TECHNIQUE: Multidetector CT imaging of the head and neck was performed using the standard protocol during bolus administration of intravenous contrast. Multiplanar CT image reconstructions and MIPs were obtained to evaluate the vascular anatomy. Carotid stenosis measurements (when applicable) are obtained utilizing NASCET criteria, using the distal internal carotid diameter as the denominator. CONTRAST:  9mL OMNIPAQUE IOHEXOL 350 MG/ML SOLN COMPARISON:  Head CT from yesterday FINDINGS: CTA NECK FINDINGS Aortic arch: Multifocal atherosclerotic plaque. No aneurysm or dissection. Two vessel arch branching. Partially covered CABG including the LIMA. Right carotid system: Calcified plaque at the bifurcation primarily affecting the ECA origin. No flow limiting stenosis in the common or internal carotid arteries. No ulceration. Subtle but convincing mid ICA beading. Left carotid system: Calcified plaque at the bifurcation primarily on the posterior wall. No flow limiting stenosis or ulceration. There is distal ICA tortuosity with looping. Mild beaded appearance to the mid left ICA. Vertebral arteries: Proximal subclavian atherosclerosis or ulceration without flow limiting stenosis.Vertebral arteries are smooth and  widely patent. Skeleton: Advanced cervical spine degeneration with multilevel ankylosis. Other neck: No evidence of mass or adenopathy. Deformity of the thyroid cartilage and larynx which appears posttraumatic. Upper chest: Negative Review of the MIP images confirms the above findings CTA HEAD FINDINGS Anterior circulation: Atherosclerotic plaque on the carotid siphons without focal or high-grade stenosis. 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 Extensive atheromatous irregularity of medium size vessels. Negative for aneurysm or vascular malformation. Posterior circulation: Slight dominance of the right vertebral artery. Both picas are symmetrically patent. Basilar is sufficiently patent. Fetal type right PCA flow. Moderate atheromatous irregularity of posterior cerebral arteries. Negative for aneurysm Venous sinuses: Unremarkable as permitted by contrast timing Anatomic variants: None significant. A noncontrast phase was not acquired. There is a known right PICA territory infarct with fourth ventricular narrowing inferiorly. Ventricular volume appears stable Review of the MIP images confirms the above findings IMPRESSION: 1. Acute right PICA territory infarct with lower fourth ventricular narrowing but no hydrocephalus currently. 2. No acute vascular finding. There is symmetric flow in both picas and no underlying atheromatous  flow limiting stenosis or ulceration. 3. Extensive intracranial atherosclerosis affecting medium size vessels. 4. Mild fibromuscular dysplasia seen at the cervical ICAs. Electronically Signed   By: Monte Fantasia M.D.   On: 08/13/2019 07:16   DG Chest 1 View  Result Date: 08/14/2019 CLINICAL DATA:  Pain following fall EXAM: CHEST  1 VIEW COMPARISON:  July 07, 2017 FINDINGS: The lungs are clear. There is cardiomegaly with pulmonary vascularity within normal limits. Pacemaker leads attached to the right heart, stable. Patient is status post coronary artery  bypass grafting. No pneumothorax. No bone lesions. IMPRESSION: Stable cardiomegaly. Postoperative changes. Stable pacemaker lead placements. Lungs clear. Electronically Signed   By: Lowella Grip III M.D.   On: 08/14/2019 11:03   CT HEAD WO CONTRAST  Result Date: 08/14/2019 CLINICAL DATA:  Follow-up head trauma.  Head trauma. EXAM: CT HEAD WITHOUT CONTRAST TECHNIQUE: Contiguous axial images were obtained from the base of the skull through the vertex without intravenous contrast. COMPARISON:  Head CT from 2 days ago FINDINGS: Brain: Acute to early subacute right PICA territory infarct with stable extent of low-density. Effacement of the lower fourth ventricle without hydrocephalus or increasing ventricular volume. Along the lower aspect of the infarct is vague high-density that maintains cerebellar architecture. Extensive chronic small vessel ischemia with confluent low-density in the deep white matter and remote lacunar infarcts at the basal ganglia. No hemorrhage. Vascular: Atherosclerotic calcification Skull: Unremarkable Sinuses/Orbits: Bilateral cataract resection IMPRESSION: Right PICA territory infarct with petechial hemorrhage and fourth ventricular narrowing. No hydrocephalus or interval change. Electronically Signed   By: Monte Fantasia M.D.   On: 08/14/2019 07:38   CT Head Wo Contrast  Result Date: 08/12/2019 CLINICAL DATA:  Dizziness for a few weeks which has worsened over the last few days. No known injury. EXAM: CT HEAD WITHOUT CONTRAST TECHNIQUE: Contiguous axial images were obtained from the base of the skull through the vertex without intravenous contrast. COMPARISON:  Head CT scan 09/07/2015. FINDINGS: Brain: There is a large area of hypoattenuation in the right cerebellar hemisphere extending to the cortex most consistent with an infarct. Atrophy and extensive chronic microvascular ischemic change have progressed since the prior MRI. No hemorrhage, midline shift or abnormal extra-axial  fluid collection. No hydrocephalus or pneumocephalus. Vascular: No hyperdense vessel is identified. Atherosclerosis is noted. Skull: Intact.  No focal lesion. Sinuses/Orbits: Status post cataract surgery.  Otherwise negative. Other: None. IMPRESSION: Larger of hypoattenuation in the right cerebellar hemisphere extending to the cortex is most consistent with an acute or early subacute infarct. Atrophy and extensive chronic microvascular ischemic change have markedly worsened since the prior head CT. Atherosclerosis. Electronically Signed   By: Inge Rise M.D.   On: 08/12/2019 16:45   CT ANGIO NECK W OR WO CONTRAST  Result Date: 08/13/2019 CLINICAL DATA:  Stroke follow-up EXAM: CT ANGIOGRAPHY HEAD AND NECK TECHNIQUE: Multidetector CT imaging of the head and neck was performed using the standard protocol during bolus administration of intravenous contrast. Multiplanar CT image reconstructions and MIPs were obtained to evaluate the vascular anatomy. Carotid stenosis measurements (when applicable) are obtained utilizing NASCET criteria, using the distal internal carotid diameter as the denominator. CONTRAST:  50mL OMNIPAQUE IOHEXOL 350 MG/ML SOLN COMPARISON:  Head CT from yesterday FINDINGS: CTA NECK FINDINGS Aortic arch: Multifocal atherosclerotic plaque. No aneurysm or dissection. Two vessel arch branching. Partially covered CABG including the LIMA. Right carotid system: Calcified plaque at the bifurcation primarily affecting the ECA origin. No flow limiting stenosis in the common or internal  carotid arteries. No ulceration. Subtle but convincing mid ICA beading. Left carotid system: Calcified plaque at the bifurcation primarily on the posterior wall. No flow limiting stenosis or ulceration. There is distal ICA tortuosity with looping. Mild beaded appearance to the mid left ICA. Vertebral arteries: Proximal subclavian atherosclerosis or ulceration without flow limiting stenosis.Vertebral arteries are smooth  and widely patent. Skeleton: Advanced cervical spine degeneration with multilevel ankylosis. Other neck: No evidence of mass or adenopathy. Deformity of the thyroid cartilage and larynx which appears posttraumatic. Upper chest: Negative Review of the MIP images confirms the above findings CTA HEAD FINDINGS Anterior circulation: Atherosclerotic plaque on the carotid siphons without focal or high-grade stenosis. 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 Extensive atheromatous irregularity of medium size vessels. Negative for aneurysm or vascular malformation. Posterior circulation: Slight dominance of the right vertebral artery. Both picas are symmetrically patent. Basilar is sufficiently patent. Fetal type right PCA flow. Moderate atheromatous irregularity of posterior cerebral arteries. Negative for aneurysm Venous sinuses: Unremarkable as permitted by contrast timing Anatomic variants: None significant. A noncontrast phase was not acquired. There is a known right PICA territory infarct with fourth ventricular narrowing inferiorly. Ventricular volume appears stable Review of the MIP images confirms the above findings IMPRESSION: 1. Acute right PICA territory infarct with lower fourth ventricular narrowing but no hydrocephalus currently. 2. No acute vascular finding. There is symmetric flow in both picas and no underlying atheromatous flow limiting stenosis or ulceration. 3. Extensive intracranial atherosclerosis affecting medium size vessels. 4. Mild fibromuscular dysplasia seen at the cervical ICAs. Electronically Signed   By: Monte Fantasia M.D.   On: 08/13/2019 07:16   ECHOCARDIOGRAM COMPLETE  Result Date: 08/13/2019    ECHOCARDIOGRAM REPORT   Patient Name:   [name] [facility] Date of Exam: 08/13/2019 Medical Rec #:  [id number]   Height:       67.0 in Accession #:    [id number]  Weight:       148.0 lb Date of Birth:  Sep 10, 1925  BSA:          1.779 m Patient Age:    60 years    BP:            129/64 mmHg Patient Gender: M           HR:           59 bpm. Exam Location:  Inpatient Procedure: 2D Echo Indications:    Stroke 434.91 / I163.9  History:        Patient has prior history of Echocardiogram examinations, most                 recent 08/09/2016. Pacemaker, Arrythmias:Atrial Fibrillation;                 Risk Factors:Diabetes, Hypertension and Dyslipidemia.  Sonographer:    Vikki Ports Turrentine Referring Phys: [place]  Sonographer Comments: Suboptimal subcostal window. Image acquisition challenging due to patient body habitus and Image acquisition challenging due to uncooperative patient. IMPRESSIONS  1. Normal LV systolic function; moderate LVH; mild AI; mild LAE.  2. Left ventricular ejection fraction, by estimation, is 60 to 65%. The left ventricle has normal function. The left ventricle has no regional wall motion abnormalities. There is moderate left ventricular hypertrophy. Left ventricular diastolic parameters are indeterminate.  3. Right ventricular systolic function is normal. The right ventricular size is normal.  4. Left atrial size was mildly dilated.  5. The mitral valve  is normal in structure. Trivial mitral valve regurgitation. No evidence of mitral stenosis.  6. The aortic valve is tricuspid. Aortic valve regurgitation is mild. Mild to moderate aortic valve sclerosis/calcification is present, without any evidence of aortic stenosis. FINDINGS  Left Ventricle: Left ventricular ejection fraction, by estimation, is 60 to 65%. The left ventricle has normal function. The left ventricle has no regional wall motion abnormalities. The left ventricular internal cavity size was normal in size. There is  moderate left ventricular hypertrophy. Left ventricular diastolic parameters are indeterminate. Right Ventricle: The right ventricular size is normal. Right ventricular systolic function is normal. Left Atrium: Left atrial size was mildly dilated. Right Atrium: Right atrial size was  normal in size. Pericardium: There is no evidence of pericardial effusion. Mitral Valve: The mitral valve is normal in structure. Normal mobility of the mitral valve leaflets. Mild mitral annular calcification. Trivial mitral valve regurgitation. No evidence of mitral valve stenosis. Tricuspid Valve: The tricuspid valve is normal in structure. Tricuspid valve regurgitation is trivial. No evidence of tricuspid stenosis. Aortic Valve: The aortic valve is tricuspid. Aortic valve regurgitation is mild. Mild to moderate aortic valve sclerosis/calcification is present, without any evidence of aortic stenosis. Aortic valve mean gradient measures 4.0 mmHg. Aortic valve peak gradient measures 7.8 mmHg. Aortic valve area, by VTI measures 2.01 cm. Pulmonic Valve: The pulmonic valve was not well visualized. Pulmonic valve regurgitation is not visualized. No evidence of pulmonic stenosis. Aorta: The aortic root is normal in size and structure. Venous: The inferior vena cava was not well visualized.  Additional Comments: Normal LV systolic function; moderate LVH; mild AI; mild LAE. A pacer wire is visualized.  LEFT VENTRICLE PLAX 2D LVIDd:         4.70 cm LVIDs:         3.20 cm LV PW:         1.40 cm LV IVS:        1.40 cm LVOT diam:     2.00 cm LV SV:         47 LV SV Index:   27 LVOT Area:     3.14 cm  RIGHT VENTRICLE TAPSE (M-mode): 1.2 cm LEFT ATRIUM             Index LA diam:        5.50 cm 3.09 cm/m LA Vol (A2C):   61.6 ml 34.62 ml/m LA Vol (A4C):   73.8 ml 41.48 ml/m LA Biplane Vol: 69.3 ml 38.95 ml/m  AORTIC VALVE AV Area (Vmax):    1.68 cm AV Area (Vmean):   1.83 cm AV Area (VTI):     2.01 cm AV Vmax:           140.00 cm/s AV Vmean:          90.000 cm/s AV VTI:            0.236 m AV Peak Grad:      7.8 mmHg AV Mean Grad:      4.0 mmHg LVOT Vmax:         74.80 cm/s LVOT Vmean:        52.500 cm/s LVOT VTI:          0.151 m LVOT/AV VTI ratio: 0.64  AORTA Ao Root diam: 3.20 cm MITRAL VALVE               TRICUSPID  VALVE MV Area (PHT): 3.50 cm    TR Peak grad:   15.1 mmHg MV Decel  Time: 217 msec    TR Vmax:        194.00 cm/s MV E velocity: 74.80 cm/s MV A velocity: 63.30 cm/s  SHUNTS MV E/A ratio:  1.18        Systemic VTI:  0.15 m                            Systemic Diam: 2.00 cm Kirk Ruths MD Electronically signed by Kirk Ruths MD Signature Date/Time: 08/13/2019/1:28:37 PM    Final    CUP PACEART REMOTE DEVICE CHECK  Result Date: 07/23/2019 Scheduled remote reviewed. Normal device function.  ERI estimated in 7 months. Will continue monthly battery checks. Felisa Bonier, RN, MSN  DG HIP UNILAT WITH PELVIS 2-3 VIEWS RIGHT  Result Date: 08/14/2019 CLINICAL DATA:  Pain following fall EXAM: DG HIP (WITH OR WITHOUT PELVIS) 2-3V RIGHT COMPARISON:  None. FINDINGS: Frontal pelvis as well as frontal and lateral right hip images were obtained. Bones are osteoporotic. There is a total hip replacement on the right with prosthetic components well-seated. No fracture or dislocation. No erosive change. There is slight narrowing of the left hip joint. Penile prosthesis present. IMPRESSION: Status post total hip replacement on the right with prosthetic components well-seated. No fracture or dislocation. Mild narrowing left hip joint. Electronically Signed   By: Lowella Grip III M.D.   On: 08/14/2019 11:02     Time Spent in minutes  30     Desiree Hane M.D on 08/16/2019 at 5:11 PM  To page go to [email or web address] - password [email or web address]

## 2019-08-16 NOTE — Progress Notes (Signed)
Physical Therapy Treatment Patient Details Name: [name] MRN: [id number] DOB: 1926/02/17 Today's Date: 08/16/2019    History of Present Illness 86 yo male s/p fall with dizziness MRI fairly large cerebellar infarct. PMH afib on eliquis, AAA, CAD, DM, Diverticulosis, Fall 2018 with facial trauma, HTN,     PT Comments    Patient progressing very slowly towards PT goals. Continues to be lethargic with difficulty following commands consistently. Also with impaired attention. Requires Mod A for bed mobility and standing from EOB with cues for hand placement. Requires Mod-Max A of 2 for gait training with RW vs HHA; pt with significant anterior lean with difficulty bringing CoM over BoS. Reports dizziness with activity. Speech is unintelligible today. Discharge recommendation updated to SNF. Will follow.     Follow Up Recommendations  SNF;Supervision for mobility/OOB     Equipment Recommendations  Wheelchair cushion (measurements PT);Rolling walker with 5" wheels;3in1 (PT);Wheelchair (measurements PT)    Recommendations for Other Services       Precautions / Restrictions Precautions Precautions: Fall Restrictions Weight Bearing Restrictions: No    Mobility  Bed Mobility Overal bed mobility: Needs Assistance Bed Mobility: Supine to Sit     Supine to sit: HOB elevated;Mod assist     General bed mobility comments: Able to initiate movement but assist needed for LEs, trunk and scooting bottom to EOB. Posterior lean  Transfers Overall transfer level: Needs assistance Equipment used: Rolling walker (2 wheeled) Transfers: Sit to/from Stand Sit to Stand: Mod assist;+2 physical assistance         General transfer comment: Assist to power to standing with use of RW, cues for hand placement/technique. Stood from [employer], from chair x1. Anterior lean with assist for stabilizing RW to prevent it from rolling out.  Ambulation/Gait Ambulation/Gait assistance: Mod assist;+2 physical  assistance;+2 safety/equipment Gait Distance (Feet): 20 Feet Assistive device: Rolling walker (2 wheeled);2 person hand held assist Gait Pattern/deviations: Step-through pattern;Decreased stride length;Narrow base of support;Trunk flexed;Shuffle;Decreased step length - right;Decreased step length - left;Step-to pattern Gait velocity: varying speeds- unsafe   General Gait Details: Unsteady gait with short shuffling steps with increased speed at times pushing RW too far anterior;heavy anterior lean with LEs not able to stay under CoM. + dizziness. Gait mechanics/balance slightly better with HHA x2 than RW.   Stairs             Wheelchair Mobility    Modified Rankin (Stroke Patients Only) Modified Rankin (Stroke Patients Only) Pre-Morbid Rankin Score: Moderate disability Modified Rankin: Moderately severe disability     Balance Overall balance assessment: Needs assistance Sitting-balance support: Feet supported;Bilateral upper extremity supported Sitting balance-Leahy Scale: Poor Sitting balance - Comments: Requires BUE support in unsupported sitting   Standing balance support: During functional activity Standing balance-Leahy Scale: Poor Standing balance comment: reliant on external support; anterior lean and will fall over without support.                            Cognition Arousal/Alertness: Lethargic Behavior During Therapy: Flat affect Overall Cognitive Status: Impaired/Different from baseline Area of Impairment: Orientation;Attention;Memory;Following commands;Awareness;Problem solving;Safety/judgement                 Orientation Level: Place;Time;Situation Current Attention Level: Focused Memory: Decreased short-term memory Following Commands: Follows one step commands inconsistently Safety/Judgement: Decreased awareness of safety;Decreased awareness of deficits Awareness: Intellectual Problem Solving: Slow processing;Decreased  initiation;Difficulty sequencing;Requires verbal cues;Requires tactile cues General Comments: Pt lethargic and  sleepy during session; difficulty following 1 step commands consistenly; poor attention. Needs constant cues to stay attended to task. Unintelligible speech      Exercises      General Comments General comments (skin integrity, edema, etc.): Family present during session.      Pertinent Vitals/Pain Pain Assessment: Faces Faces Pain Scale: No hurt    Home Living                      Prior Function            PT Goals (current goals can now be found in the care plan section) Progress towards PT goals: Progressing toward goals(slowly)    Frequency    Min 4X/week      PT Plan Discharge plan needs to be updated    Co-evaluation              AM-PAC PT "6 Clicks" Mobility   Outcome Measure  Help needed turning from your back to your side while in a flat bed without using bedrails?: A Lot Help needed moving from lying on your back to sitting on the side of a flat bed without using bedrails?: A Lot Help needed moving to and from a bed to a chair (including a wheelchair)?: A Lot Help needed standing up from a chair using your arms (e.g., wheelchair or bedside chair)?: A Lot Help needed to walk in hospital room?: Total Help needed climbing 3-5 steps with a railing? : Total 6 Click Score: 10    End of Session Equipment Utilized During Treatment: Gait belt Activity Tolerance: Patient limited by lethargy Patient left: in chair;with call bell/phone within reach;with family/visitor present(no chair alarm pads left; family to left RN know if leaving) Nurse Communication: Mobility status;Other (comment)(speech difficulties) PT Visit Diagnosis: Unsteadiness on feet (R26.81);Difficulty in walking, not elsewhere classified (R26.2)     Time: [id number] PT Time Calculation (min) (ACUTE ONLY): 23 min  Charges:  $Gait Training: 8-22 mins $Therapeutic  Activity: 8-22 mins                     Marisa Severin, PT, DPT Acute Rehabilitation Services Pager [phone] Office [place] 08/16/2019, 4:59 PM

## 2019-08-16 NOTE — Care Management Important Message (Signed)
Important Message  Patient Details  Name: [name] MRN: [id number] Date of Birth: [birth year]   Medicare Important Message Given:  Yes     Orbie Pyo 08/16/2019, 3:17 PM

## 2019-08-16 NOTE — Progress Notes (Signed)
Inpatient Rehab Admissions:  Inpatient Rehab Consult received.  I met with patient at the bedside.  He is completely disoriented, calling out for [place].  Per consult from Dr. Ranell Patrick, pt's family would like to pursue SNF for rehab and long term placement.  Will sign off at this time.    Signed: Shann Medal, PT, DPT Admissions Coordinator [phone] 08/16/19  4:30 PM

## 2019-08-17 DIAGNOSIS — N400 Enlarged prostate without lower urinary tract symptoms: Secondary | ICD-10-CM

## 2019-08-17 LAB — GLUCOSE, CAPILLARY
Glucose-Capillary: 128 mg/dL — ABNORMAL HIGH (ref 70–99)
Glucose-Capillary: 201 mg/dL — ABNORMAL HIGH (ref 70–99)
Glucose-Capillary: 127 mg/dL — ABNORMAL HIGH (ref 70–99)
Glucose-Capillary: 188 mg/dL — ABNORMAL HIGH (ref 70–99)

## 2019-08-17 LAB — URINALYSIS, COMPLETE (UACMP) WITH MICROSCOPIC
Bilirubin Urine: NEGATIVE
Glucose, UA: NEGATIVE mg/dL
Ketones, ur: NEGATIVE mg/dL
Nitrite: NEGATIVE
Protein, ur: 30 mg/dL — AB
RBC / HPF: >50 RBC/hpf — ABNORMAL HIGH (ref 0–5)
Specific Gravity, Urine: 1.015 (ref 1.005–1.030)
WBC, UA: >50 WBC/hpf — ABNORMAL HIGH (ref 0–5)
pH: 5 (ref 5.0–8.0)

## 2019-08-17 LAB — SARS CORONAVIRUS 2 (TAT 6-24 HRS): SARS Coronavirus 2: NEGATIVE

## 2019-08-17 NOTE — Progress Notes (Signed)
[facility]  PROGRESS NOTE  [name] [id number] DOB: December 25, 1925 DOA: 08/12/2019 PCP: Colon Branch, MD Admit date - 08/12/2019   Admitting Physician Annita Brod, MD  Outpatient Primary MD for the patient is Colon Branch, MD  LOS - 4 Brief Narrative   [name] is a 86 y.o. year old male with medical history significant for atrial fibrillation on Eliquis, HTN HLD, type 2 diabetes, CHB with pacemaker, CAD illnesses CABG), AAA who presented on 08/12/2019 with complaints of dizziness for the past weeks that worsened over the past few days as well as recurrent falls at home and was admitted with working diagnosis of acute CVA.    In the ED he was afebrile, tachypneic with respiratory range of 23-26, heart rate 59-32 otherwise hemodynamically stable.  Covid test was negative. Had slight leukocytosis of 10.7.  Was found to have large hypoattenuation of right cerebellar area on CT head.  Patient was unable to obtain MRI due to presence of pacemaker.  Neurology was consulted, CTA head and neck consistent with right PCA territory infarct.  TTE showed preserved EF with no source of embolus, LDL 84, A1c 7.6.  She recommended continuing Eliquis in addition to aspirin and statin on discharge.  In speaking with daughters, there is concern patient has not been adherent to Eliquis therapy due to intermittent confusion suggestive of underlying dementia.  Patient currently not competent to make decisions based off mental status, family agreeable to SNF.   Subjective  Today patient resting comfortably in bed. Daughter at bedside. 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 -PT recommends CIR, family elects for skilled nursing facility -continue Eliquis, no additional aspirin per neurology -Continue statin  Senile dementia without behavioral disturbance.  Ongoing for quite some  time, with worsening confusion over the past year per family.  Family elects for skilled nursing facility on discharge . Pleasantly confused and alert to self currently  -Social worker consulted, discussed with family.  Fall from bed (4/3) with scalp laceration.  CT head unremarkable, hip x-ray with no signs of fracture -Up with assistance only -Delirium precautions  Type 2 diabetes, A1c 7.6 FBG at goal  -Hold oral hypoglycemic regimen in hospital -Holding home Lantus 38 units -Sliding scale as needed, monitor CBGs  HTN, allow for permissive hypertension in setting of subacute CVA-monitor blood pressure goal normotensive  Permanent atrial fibrillation status post pacemaker for complete heart block, currently rate controlled -continue home Eliquis, digoxin, Lopressor -monitor on telemetry  BPH. Has history of penile implant in the 90s. Reports of darker urine per nursing -check UA  HLD, LDL at 84 -Pravachol 80 mg  Mood disorder, stable -Continue Lexapro  Hypothyroidism, stable -Home Synthroid 100 mcg    CAD.  No anginal symptoms.  Troponin unremarkable. -Continue Lopressor, statin     Family Communication  : Updated daughter at bedside  Code Status : Full  Disposition Plan  :  Patient is from home. Anticipated d/c date: 24-48 hours Barriers to d/c or necessity for inpatient status:  Patient medically stable, currently awaiting social worker to arrange SNF based off family's preference Consults  : Neurology  Procedures  : TTE, 08/13/2019 60-65%  DVT Prophylaxis  : Eliquis  Lab Results  Component Value Date   PLT 182 08/12/2019    Diet :  Diet Order  Diet heart healthy/carb modified Room service appropriate? Yes; Fluid consistency: Thin  Diet effective now               Inpatient Medications Scheduled Meds: . apixaban  5 mg Oral BID  . digoxin  0.125 mg Oral QODAY  . escitalopram  10 mg Oral Daily  . fenofibrate  54 mg Oral Daily  . insulin  aspart  0-5 Units Subcutaneous QHS  . insulin aspart  0-9 Units Subcutaneous TID WC  . levothyroxine  100 mcg Oral QAC breakfast  . loratadine  10 mg Oral Daily  . metoprolol tartrate  12.5 mg Oral BID  . mirtazapine  15 mg Oral QHS  . pantoprazole  40 mg Oral QAC breakfast  . pravastatin  80 mg Oral Daily   Continuous Infusions: PRN Meds:.acetaminophen **OR** acetaminophen (TYLENOL) oral liquid 160 mg/5 mL **OR** acetaminophen, azelastine, LORazepam, senna-docusate  Antibiotics  :   Anti-infectives (From admission, onward)   None       Objective   Vitals:   08/17/19 0800 08/17/19 1258 08/17/19 1600 08/17/19 1958  BP: 109/66 130/64 118/66 (!) 116/54  Pulse: 72 64 61 60  Resp: 18 18 16 17   Temp: 98 F (36.7 C) 98 F (36.7 C) 99.3 F (37.4 C) 98.1 F (36.7 C)  TempSrc: Oral Oral Oral Oral  SpO2: 98% 100% 99% 97%    SpO2: 97 %  Wt Readings from Last 3 Encounters:  05/20/19 67.1 kg  09/16/18 73.3 kg  07/01/18 74.6 kg     Intake/Output Summary (Last 24 hours) at 08/17/2019 2134 Last data filed at 08/17/2019 1715 Gross per 24 hour  Intake --  Output 710 ml  Net -710 ml    Physical Exam:  Awake Alert, oriented to self only, pleasantly confused No new F.N deficits, follows commands with no focal deficits [place].AT, Normal respiratory effort on room air, CTAB Irregularly irregular, normal rate,No Gallops,Rubs or new Murmurs,  +ve B.Sounds, Abd Soft, No tenderness, No rebound, guarding or rigidity. No Cyanosis, No new Rash or bruise     I have personally reviewed the following:   Data Reviewed:  CBC Recent Labs  Lab 08/12/19 1320  WBC 10.7*  HGB 14.5  HCT 43.6  PLT 182  MCV 96.0  MCH 31.9  MCHC 33.3  RDW 13.3    Chemistries  Recent Labs  Lab 08/12/19 1320 08/12/19 1520  NA 135  --   K 4.4  --   CL 103  --   CO2 22  --   GLUCOSE 244*  --   BUN 18  --   CREATININE 1.23  --   CALCIUM 9.0  --   MG  --  2.0    ------------------------------------------------------------------------------------------------------------------ No results for input(s): CHOL, HDL, LDLCALC, TRIG, CHOLHDL, LDLDIRECT in the last 72 hours.  Lab Results  Component Value Date   HGBA1C 7.6 (H) 08/12/2019   ------------------------------------------------------------------------------------------------------------------ No results for input(s): TSH, T4TOTAL, T3FREE, THYROIDAB in the last 72 hours.  Invalid input(s): FREET3 ------------------------------------------------------------------------------------------------------------------ No results for input(s): VITAMINB12, FOLATE, FERRITIN, TIBC, IRON, RETICCTPCT in the last 72 hours.  Coagulation profile No results for input(s): INR, PROTIME in the last 168 hours.  No results for input(s): DDIMER in the last 72 hours.  Cardiac Enzymes No results for input(s): CKMB, TROPONINI, MYOGLOBIN in the last 168 hours.  Invalid input(s): CK ------------------------------------------------------------------------------------------------------------------ No results found for: BNP  Micro Results Recent Results (from the past 240 hour(s))  SARS CORONAVIRUS 2 (TAT 6-24  HRS) Nasopharyngeal Nasopharyngeal Swab     Status: None   Collection Time: 08/12/19  6:55 PM   Specimen: Nasopharyngeal Swab  Result Value Ref Range Status   SARS Coronavirus 2 NEGATIVE NEGATIVE Final    Comment: (NOTE) SARS-CoV-2 target nucleic acids are NOT DETECTED. The SARS-CoV-2 RNA is generally detectable in upper and lower respiratory specimens during the acute phase of infection. Negative results do not preclude SARS-CoV-2 infection, do not rule out co-infections with other pathogens, and should not be used as the sole basis for treatment or other patient management decisions. Negative results must be combined with clinical observations, patient history, and epidemiological information. The  expected result is Negative. Fact Sheet for Patients: SugarRoll.be Fact Sheet for Healthcare Providers: https://www.woods-mathews.com/ This test is not yet approved or cleared by the [place] FDA and  has been authorized for detection and/or diagnosis of SARS-CoV-2 by FDA under an Emergency Use Authorization (EUA). This EUA will remain  in effect (meaning this test can be used) for the duration of the COVID-19 declaration under Section 56 4(b)(1) of the Act, 21 U.S.C. section 360bbb-3(b)(1), unless the authorization is terminated or revoked sooner. Performed at [facility], [place] [address] [place], [place] [address]     Radiology Reports CT ANGIO HEAD W OR WO CONTRAST  Result Date: 08/13/2019 CLINICAL DATA:  Stroke follow-up EXAM: CT ANGIOGRAPHY HEAD AND NECK TECHNIQUE: Multidetector CT imaging of the head and neck was performed using the standard protocol during bolus administration of intravenous contrast. Multiplanar CT image reconstructions and MIPs were obtained to evaluate the vascular anatomy. Carotid stenosis measurements (when applicable) are obtained utilizing NASCET criteria, using the distal internal carotid diameter as the denominator. CONTRAST:  8mL OMNIPAQUE IOHEXOL 350 MG/ML SOLN COMPARISON:  Head CT from yesterday FINDINGS: CTA NECK FINDINGS Aortic arch: Multifocal atherosclerotic plaque. No aneurysm or dissection. Two vessel arch branching. Partially covered CABG including the LIMA. Right carotid system: Calcified plaque at the bifurcation primarily affecting the ECA origin. No flow limiting stenosis in the common or internal carotid arteries. No ulceration. Subtle but convincing mid ICA beading. Left carotid system: Calcified plaque at the bifurcation primarily on the posterior wall. No flow limiting stenosis or ulceration. There is distal ICA tortuosity with looping. Mild beaded appearance to the mid left ICA. Vertebral  arteries: Proximal subclavian atherosclerosis or ulceration without flow limiting stenosis.Vertebral arteries are smooth and widely patent. Skeleton: Advanced cervical spine degeneration with multilevel ankylosis. Other neck: No evidence of mass or adenopathy. Deformity of the thyroid cartilage and larynx which appears posttraumatic. Upper chest: Negative Review of the MIP images confirms the above findings CTA HEAD FINDINGS Anterior circulation: Atherosclerotic plaque on the carotid siphons without focal or high-grade stenosis. 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 Extensive atheromatous irregularity of medium size vessels. Negative for aneurysm or vascular malformation. Posterior circulation: Slight dominance of the right vertebral artery. Both picas are symmetrically patent. Basilar is sufficiently patent. Fetal type right PCA flow. Moderate atheromatous irregularity of posterior cerebral arteries. Negative for aneurysm Venous sinuses: Unremarkable as permitted by contrast timing Anatomic variants: None significant. A noncontrast phase was not acquired. There is a known right PICA territory infarct with fourth ventricular narrowing inferiorly. Ventricular volume appears stable Review of the MIP images confirms the above findings IMPRESSION: 1. Acute right PICA territory infarct with lower fourth ventricular narrowing but no hydrocephalus currently. 2. No acute vascular finding. There is symmetric flow in both picas and no  underlying atheromatous flow limiting stenosis or ulceration. 3. Extensive intracranial atherosclerosis affecting medium size vessels. 4. Mild fibromuscular dysplasia seen at the cervical ICAs. Electronically Signed   By: Monte Fantasia M.D.   On: 08/13/2019 07:16   DG Chest 1 View  Result Date: 08/14/2019 CLINICAL DATA:  Pain following fall EXAM: CHEST  1 VIEW COMPARISON:  July 07, 2017 FINDINGS: The lungs are clear. There is cardiomegaly with pulmonary  vascularity within normal limits. Pacemaker leads attached to the right heart, stable. Patient is status post coronary artery bypass grafting. No pneumothorax. No bone lesions. IMPRESSION: Stable cardiomegaly. Postoperative changes. Stable pacemaker lead placements. Lungs clear. Electronically Signed   By: Lowella Grip III M.D.   On: 08/14/2019 11:03   CT HEAD WO CONTRAST  Result Date: 08/14/2019 CLINICAL DATA:  Follow-up head trauma.  Head trauma. EXAM: CT HEAD WITHOUT CONTRAST TECHNIQUE: Contiguous axial images were obtained from the base of the skull through the vertex without intravenous contrast. COMPARISON:  Head CT from 2 days ago FINDINGS: Brain: Acute to early subacute right PICA territory infarct with stable extent of low-density. Effacement of the lower fourth ventricle without hydrocephalus or increasing ventricular volume. Along the lower aspect of the infarct is vague high-density that maintains cerebellar architecture. Extensive chronic small vessel ischemia with confluent low-density in the deep white matter and remote lacunar infarcts at the basal ganglia. No hemorrhage. Vascular: Atherosclerotic calcification Skull: Unremarkable Sinuses/Orbits: Bilateral cataract resection IMPRESSION: Right PICA territory infarct with petechial hemorrhage and fourth ventricular narrowing. No hydrocephalus or interval change. Electronically Signed   By: Monte Fantasia M.D.   On: 08/14/2019 07:38   CT Head Wo Contrast  Result Date: 08/12/2019 CLINICAL DATA:  Dizziness for a few weeks which has worsened over the last few days. No known injury. EXAM: CT HEAD WITHOUT CONTRAST TECHNIQUE: Contiguous axial images were obtained from the base of the skull through the vertex without intravenous contrast. COMPARISON:  Head CT scan 09/07/2015. FINDINGS: Brain: There is a large area of hypoattenuation in the right cerebellar hemisphere extending to the cortex most consistent with an infarct. Atrophy and extensive  chronic microvascular ischemic change have progressed since the prior MRI. No hemorrhage, midline shift or abnormal extra-axial fluid collection. No hydrocephalus or pneumocephalus. Vascular: No hyperdense vessel is identified. Atherosclerosis is noted. Skull: Intact.  No focal lesion. Sinuses/Orbits: Status post cataract surgery.  Otherwise negative. Other: None. IMPRESSION: Larger of hypoattenuation in the right cerebellar hemisphere extending to the cortex is most consistent with an acute or early subacute infarct. Atrophy and extensive chronic microvascular ischemic change have markedly worsened since the prior head CT. Atherosclerosis. Electronically Signed   By: Inge Rise M.D.   On: 08/12/2019 16:45   CT ANGIO NECK W OR WO CONTRAST  Result Date: 08/13/2019 CLINICAL DATA:  Stroke follow-up EXAM: CT ANGIOGRAPHY HEAD AND NECK TECHNIQUE: Multidetector CT imaging of the head and neck was performed using the standard protocol during bolus administration of intravenous contrast. Multiplanar CT image reconstructions and MIPs were obtained to evaluate the vascular anatomy. Carotid stenosis measurements (when applicable) are obtained utilizing NASCET criteria, using the distal internal carotid diameter as the denominator. CONTRAST:  65mL OMNIPAQUE IOHEXOL 350 MG/ML SOLN COMPARISON:  Head CT from yesterday FINDINGS: CTA NECK FINDINGS Aortic arch: Multifocal atherosclerotic plaque. No aneurysm or dissection. Two vessel arch branching. Partially covered CABG including the LIMA. Right carotid system: Calcified plaque at the bifurcation primarily affecting the ECA origin. No flow limiting stenosis in the common  or internal carotid arteries. No ulceration. Subtle but convincing mid ICA beading. Left carotid system: Calcified plaque at the bifurcation primarily on the posterior wall. No flow limiting stenosis or ulceration. There is distal ICA tortuosity with looping. Mild beaded appearance to the mid left ICA.  Vertebral arteries: Proximal subclavian atherosclerosis or ulceration without flow limiting stenosis.Vertebral arteries are smooth and widely patent. Skeleton: Advanced cervical spine degeneration with multilevel ankylosis. Other neck: No evidence of mass or adenopathy. Deformity of the thyroid cartilage and larynx which appears posttraumatic. Upper chest: Negative Review of the MIP images confirms the above findings CTA HEAD FINDINGS Anterior circulation: Atherosclerotic plaque on the carotid siphons without focal or high-grade stenosis. 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 Extensive atheromatous irregularity of medium size vessels. Negative for aneurysm or vascular malformation. Posterior circulation: Slight dominance of the right vertebral artery. Both picas are symmetrically patent. Basilar is sufficiently patent. Fetal type right PCA flow. Moderate atheromatous irregularity of posterior cerebral arteries. Negative for aneurysm Venous sinuses: Unremarkable as permitted by contrast timing Anatomic variants: None significant. A noncontrast phase was not acquired. There is a known right PICA territory infarct with fourth ventricular narrowing inferiorly. Ventricular volume appears stable Review of the MIP images confirms the above findings IMPRESSION: 1. Acute right PICA territory infarct with lower fourth ventricular narrowing but no hydrocephalus currently. 2. No acute vascular finding. There is symmetric flow in both picas and no underlying atheromatous flow limiting stenosis or ulceration. 3. Extensive intracranial atherosclerosis affecting medium size vessels. 4. Mild fibromuscular dysplasia seen at the cervical ICAs. Electronically Signed   By: Monte Fantasia M.D.   On: 08/13/2019 07:16   ECHOCARDIOGRAM COMPLETE  Result Date: 08/13/2019    ECHOCARDIOGRAM REPORT   Patient Name:   [name] [facility] Date of Exam: 08/13/2019 Medical Rec #:  [id number]   Height:       67.0 in Accession  #:    [id number]  Weight:       148.0 lb Date of Birth:  May 12, 1926  BSA:          1.779 m Patient Age:    21 years    BP:           129/64 mmHg Patient Gender: M           HR:           59 bpm. Exam Location:  Inpatient Procedure: 2D Echo Indications:    Stroke 434.91 / I163.9  History:        Patient has prior history of Echocardiogram examinations, most                 recent 08/09/2016. Pacemaker, Arrythmias:Atrial Fibrillation;                 Risk Factors:Diabetes, Hypertension and Dyslipidemia.  Sonographer:    Vikki Ports Turrentine Referring Phys: [place]  Sonographer Comments: Suboptimal subcostal window. Image acquisition challenging due to patient body habitus and Image acquisition challenging due to uncooperative patient. IMPRESSIONS  1. Normal LV systolic function; moderate LVH; mild AI; mild LAE.  2. Left ventricular ejection fraction, by estimation, is 60 to 65%. The left ventricle has normal function. The left ventricle has no regional wall motion abnormalities. There is moderate left ventricular hypertrophy. Left ventricular diastolic parameters are indeterminate.  3. Right ventricular systolic function is normal. The right ventricular size is normal.  4. Left atrial size was mildly dilated.  5. The  mitral valve is normal in structure. Trivial mitral valve regurgitation. No evidence of mitral stenosis.  6. The aortic valve is tricuspid. Aortic valve regurgitation is mild. Mild to moderate aortic valve sclerosis/calcification is present, without any evidence of aortic stenosis. FINDINGS  Left Ventricle: Left ventricular ejection fraction, by estimation, is 60 to 65%. The left ventricle has normal function. The left ventricle has no regional wall motion abnormalities. The left ventricular internal cavity size was normal in size. There is  moderate left ventricular hypertrophy. Left ventricular diastolic parameters are indeterminate. Right Ventricle: The right ventricular size is normal.  Right ventricular systolic function is normal. Left Atrium: Left atrial size was mildly dilated. Right Atrium: Right atrial size was normal in size. Pericardium: There is no evidence of pericardial effusion. Mitral Valve: The mitral valve is normal in structure. Normal mobility of the mitral valve leaflets. Mild mitral annular calcification. Trivial mitral valve regurgitation. No evidence of mitral valve stenosis. Tricuspid Valve: The tricuspid valve is normal in structure. Tricuspid valve regurgitation is trivial. No evidence of tricuspid stenosis. Aortic Valve: The aortic valve is tricuspid. Aortic valve regurgitation is mild. Mild to moderate aortic valve sclerosis/calcification is present, without any evidence of aortic stenosis. Aortic valve mean gradient measures 4.0 mmHg. Aortic valve peak gradient measures 7.8 mmHg. Aortic valve area, by VTI measures 2.01 cm. Pulmonic Valve: The pulmonic valve was not well visualized. Pulmonic valve regurgitation is not visualized. No evidence of pulmonic stenosis. Aorta: The aortic root is normal in size and structure. Venous: The inferior vena cava was not well visualized.  Additional Comments: Normal LV systolic function; moderate LVH; mild AI; mild LAE. A pacer wire is visualized.  LEFT VENTRICLE PLAX 2D LVIDd:         4.70 cm LVIDs:         3.20 cm LV PW:         1.40 cm LV IVS:        1.40 cm LVOT diam:     2.00 cm LV SV:         47 LV SV Index:   27 LVOT Area:     3.14 cm  RIGHT VENTRICLE TAPSE (M-mode): 1.2 cm LEFT ATRIUM             Index LA diam:        5.50 cm 3.09 cm/m LA Vol (A2C):   61.6 ml 34.62 ml/m LA Vol (A4C):   73.8 ml 41.48 ml/m LA Biplane Vol: 69.3 ml 38.95 ml/m  AORTIC VALVE AV Area (Vmax):    1.68 cm AV Area (Vmean):   1.83 cm AV Area (VTI):     2.01 cm AV Vmax:           140.00 cm/s AV Vmean:          90.000 cm/s AV VTI:            0.236 m AV Peak Grad:      7.8 mmHg AV Mean Grad:      4.0 mmHg LVOT Vmax:         74.80 cm/s LVOT Vmean:         52.500 cm/s LVOT VTI:          0.151 m LVOT/AV VTI ratio: 0.64  AORTA Ao Root diam: 3.20 cm MITRAL VALVE               TRICUSPID VALVE MV Area (PHT): 3.50 cm    TR Peak grad:   15.1 mmHg  MV Decel Time: 217 msec    TR Vmax:        194.00 cm/s MV E velocity: 74.80 cm/s MV A velocity: 63.30 cm/s  SHUNTS MV E/A ratio:  1.18        Systemic VTI:  0.15 m                            Systemic Diam: 2.00 cm Kirk Ruths MD Electronically signed by Kirk Ruths MD Signature Date/Time: 08/13/2019/1:28:37 PM    Final    CUP PACEART REMOTE DEVICE CHECK  Result Date: 07/23/2019 Scheduled remote reviewed. Normal device function.  ERI estimated in 7 months. Will continue monthly battery checks. Felisa Bonier, RN, MSN  DG HIP UNILAT WITH PELVIS 2-3 VIEWS RIGHT  Result Date: 08/14/2019 CLINICAL DATA:  Pain following fall EXAM: DG HIP (WITH OR WITHOUT PELVIS) 2-3V RIGHT COMPARISON:  None. FINDINGS: Frontal pelvis as well as frontal and lateral right hip images were obtained. Bones are osteoporotic. There is a total hip replacement on the right with prosthetic components well-seated. No fracture or dislocation. No erosive change. There is slight narrowing of the left hip joint. Penile prosthesis present. IMPRESSION: Status post total hip replacement on the right with prosthetic components well-seated. No fracture or dislocation. Mild narrowing left hip joint. Electronically Signed   By: Lowella Grip III M.D.   On: 08/14/2019 11:02     Time Spent in minutes  30     Desiree Hane M.D on 08/17/2019 at 9:34 PM  To page go to [email or web address] - password [facility]

## 2019-08-18 DIAGNOSIS — F05 Delirium due to known physiological condition: Secondary | ICD-10-CM

## 2019-08-18 DIAGNOSIS — E039 Hypothyroidism, unspecified: Secondary | ICD-10-CM

## 2019-08-18 DIAGNOSIS — E118 Type 2 diabetes mellitus with unspecified complications: Secondary | ICD-10-CM

## 2019-08-18 DIAGNOSIS — F0390 Unspecified dementia without behavioral disturbance: Secondary | ICD-10-CM

## 2019-08-18 LAB — CBC WITH DIFFERENTIAL/PLATELET
Abs Immature Granulocytes: 0.07 10*3/uL (ref 0.00–0.07)
Basophils Absolute: 0 10*3/uL (ref 0.0–0.1)
Basophils Relative: 0 %
Eosinophils Absolute: 0.1 10*3/uL (ref 0.0–0.5)
Eosinophils Relative: 0 %
HCT: 36.2 % — ABNORMAL LOW (ref 39.0–52.0)
Hemoglobin: 12 g/dL — ABNORMAL LOW (ref 13.0–17.0)
Immature Granulocytes: 1 %
Lymphocytes Relative: 4 %
Lymphs Abs: 0.6 10*3/uL — ABNORMAL LOW (ref 0.7–4.0)
MCH: 31.8 pg (ref 26.0–34.0)
MCHC: 33.1 g/dL (ref 30.0–36.0)
MCV: 96 fL (ref 80.0–100.0)
Monocytes Absolute: 1.3 10*3/uL — ABNORMAL HIGH (ref 0.1–1.0)
Monocytes Relative: 9 %
Neutro Abs: 12.4 10*3/uL — ABNORMAL HIGH (ref 1.7–7.7)
Neutrophils Relative %: 86 %
Platelets: 162 10*3/uL (ref 150–400)
RBC: 3.77 MIL/uL — ABNORMAL LOW (ref 4.22–5.81)
RDW: 13.3 % (ref 11.5–15.5)
WBC: 14.5 10*3/uL — ABNORMAL HIGH (ref 4.0–10.5)
nRBC: 0 % (ref 0.0–0.2)

## 2019-08-18 LAB — GLUCOSE, CAPILLARY
Glucose-Capillary: 137 mg/dL — ABNORMAL HIGH (ref 70–99)
Glucose-Capillary: 202 mg/dL — ABNORMAL HIGH (ref 70–99)
Glucose-Capillary: 122 mg/dL — ABNORMAL HIGH (ref 70–99)
Glucose-Capillary: 126 mg/dL — ABNORMAL HIGH (ref 70–99)

## 2019-08-18 MED ORDER — SODIUM CHLORIDE 0.9 % IV SOLN
1.0000 g | INTRAVENOUS | Status: DC
  Administered 2019-08-19: 1 g via INTRAVENOUS
  Filled 2019-08-18: qty 10

## 2019-08-18 MED ORDER — SODIUM CHLORIDE 0.9 % IV SOLN: INTRAVENOUS | Status: DC

## 2019-08-18 MED ORDER — SODIUM CHLORIDE 0.9 % IV BOLUS
500.0000 mL | Freq: Once | INTRAVENOUS | Status: AC
  Administered 2019-08-18: 500 mL via INTRAVENOUS

## 2019-08-18 NOTE — Plan of Care (Signed)

## 2019-08-18 NOTE — Progress Notes (Signed)
SLP Cancellation Note  Patient Details Name: [name] MRN: [id number] DOB: [birth year]   Cancelled treatment:       Reason Eval/Treat Not Completed: Medical issues which prohibited therapy.  Per OT, there are concerns for orthostatic BP and pt has decreased response to staff verbally.  SLP will f/u as appropriate.     Elvia Collum [name] 08/18/2019, 4:00 PM

## 2019-08-18 NOTE — Progress Notes (Addendum)
Charge nurse unable to speak with family member at this time do to getting a new admission.  Daughter not happy with this so AC was called and spoke with daughter.  AC asked RN to contact on call to see if they can speak with her via phone in regards to UA results.  Dr. Baltazar Najjar was paged with [facility].

## 2019-08-18 NOTE — Progress Notes (Signed)
Per daughter, patient is refusing vital signs and assessment at this time.  Patient just went to sleep and has been very agitated most of the shift.

## 2019-08-18 NOTE — Progress Notes (Addendum)
patient has removed his telemetry box and refusing to let us reapply it at this time; daughter at bedside and aware of situation.

## 2019-08-18 NOTE — Progress Notes (Signed)
Physical Therapy Treatment Patient Details Name: [name] MRN: [id number] DOB: 1925-11-15 Today's Date: 08/18/2019    History of Present Illness 86 yo male s/p fall with dizziness MRI fairly large cerebellar infarct. 4/3 fall striking head PMH afib on eliquis.   Pt with PMH including AAA, CAD, DM, Diverticulosis, Fall 2018 with facial trauma, HTN,     PT Comments     Pt was limited today due to orthostatic hypotension.  He was able to ambulate 12' and attempted 2 other times but began showing symptoms of hypotension and BP was checked (see below).  Pt had to be returned to supine in trendlenberg position with RN present.  With ambulation pt requiring assist of 2, chair follow, and facilitation and cues for posture and RW management.    Follow Up Recommendations  SNF;Supervision for mobility/OOB     Equipment Recommendations  Wheelchair cushion (measurements PT);Rolling walker with 5" wheels;3in1 (PT);Wheelchair (measurements PT)    Recommendations for Other Services       Precautions / Restrictions Precautions Precautions: Fall Precaution Comments: orthostatic bp Restrictions Weight Bearing Restrictions: No    Mobility  Bed Mobility Overal bed mobility: Needs Assistance Bed Mobility: Supine to Sit;Sit to Supine     Supine to sit: Mod assist Sit to supine: +2 for physical assistance;Max assist   General bed mobility comments: At EOB with OT upon arrival; requiring max x 2 to return to supine due to orthostatic hypotension  Transfers Overall transfer level: Needs assistance Equipment used: Rolling walker (2 wheeled) Transfers: Sit to/from Stand Sit to Stand: Mod assist;+2 physical assistance         General transfer comment: Performed x 3; cues for safe hand placment and facilitation for posture; required increased assist on last sit to stand due to orthostatic hypotension  Ambulation/Gait Ambulation/Gait assistance: Mod assist;+2 physical assistance;+2  safety/equipment Gait Distance (Feet): 12 Feet(then 2'x2) Assistive device: Rolling walker (2 wheeled);2 person hand held assist Gait Pattern/deviations: Step-through pattern;Decreased stride length;Trunk flexed;Narrow base of support Gait velocity: decreased   General Gait Details: Pt ambulated 12' to sink then performed ADLs and had sitting rest break.  Attempted to ambulate again, but pt only able to take a couple steps and reports dizzy.  He was found to be orthostatic and returned to bed.  With gait pt required cues and facilitation for increased step length and posture.   Stairs             Wheelchair Mobility    Modified Rankin (Stroke Patients Only) Modified Rankin (Stroke Patients Only) Pre-Morbid Rankin Score: Moderate disability Modified Rankin: Moderately severe disability     Balance Overall balance assessment: Needs assistance Sitting-balance support: Feet supported;Bilateral upper extremity supported Sitting balance-Leahy Scale: Fair     Standing balance support: Bilateral upper extremity supported;During functional activity Standing balance-Leahy Scale: Poor Standing balance comment: Pt required use of bil UE and min-mod A from therapist for balance                            Cognition Arousal/Alertness: Awake/alert Behavior During Therapy: Flat affect Overall Cognitive Status: History of cognitive impairments - at baseline                                 General Comments: pt noted to have more slurred speech, decreased attention to task, decreased response to family present than Friday 4/2.  Pt not welcoming the telemetry box placement this session and needed calming cues to don on patient.       Exercises      General Comments General comments (skin integrity, edema, etc.): Pt was able to ambulate with PT 12' and perform ADLs at sink.  On second attempt of ambulation , pt reports increased dizziness and returned to sitting.   Did note nystagmus when looking to the side but unable to determine direction as pt closed eyes.  Pt reports increased dizziness and closing eyes.  Checked and BP was 147/59 in sitting.  Had pt stand again, reported dizziness and became less responsive (mumbling more). Returned to sitting and BP 118/60 (notified RN).  Returned pt to supine and BP 127/58.  Pt's HOB elevated slightly and as getting his needs he became less responsive again.  Checked and BP 107/48.  Pt placed in trendlenberg position and BP up to 112/55.  Notified RN that BPs worsened again (nursing notifiying MD).  Continued to monitor BPs and up to 129/50.  Attempted to raise HOB and back to 109/48.  Back in Trendelenberg 116/49.  O2 sats stable and HR 60-70 bpm throughout tx.  RN present and starting bolus.  Educated family on orthostatic BP, Trendelenberg position, and MAP safe level for perfusion.      Pertinent Vitals/Pain Pain Assessment: No/denies pain    Home Living                      Prior Function            PT Goals (current goals can now be found in the care plan section) Acute Rehab PT Goals Patient Stated Goal: none stated by patient this session Progress towards PT goals: Progressing toward goals    Frequency    Min 4X/week      PT Plan Current plan remains appropriate    Co-evaluation PT/OT/SLP Co-Evaluation/Treatment: Yes Reason for Co-Treatment: Complexity of the patient's impairments (multi-system involvement) PT goals addressed during session: Mobility/safety with mobility;Balance OT goals addressed during session: ADL's and self-care      AM-PAC PT "6 Clicks" Mobility   Outcome Measure  Help needed turning from your back to your side while in a flat bed without using bedrails?: A Lot Help needed moving from lying on your back to sitting on the side of a flat bed without using bedrails?: A Lot Help needed moving to and from a bed to a chair (including a wheelchair)?: A Lot Help  needed standing up from a chair using your arms (e.g., wheelchair or bedside chair)?: A Lot Help needed to walk in hospital room?: A Lot Help needed climbing 3-5 steps with a railing? : Total 6 Click Score: 11    End of Session Equipment Utilized During Treatment: Gait belt Activity Tolerance: Other (comment)(limited by orthostatic bp) Patient left: with call bell/phone within reach;with family/visitor present;in bed;with nursing/sitter in room(family declined bed alarm earleir) Nurse Communication: Mobility status;Other (comment)(BP) PT Visit Diagnosis: Unsteadiness on feet (R26.81);Difficulty in walking, not elsewhere classified (R26.2)     Time: [id number] PT Time Calculation (min) (ACUTE ONLY): 40 min  Charges:  $Gait Training: 8-22 mins                     Maggie Font, PT Acute Rehab Services Pager [phone] [facility] [phone] [facility] [phone]    Karlton Lemon 08/18/2019, 4:43 PM

## 2019-08-18 NOTE — TOC Progression Note (Signed)
Transition of Care [facility]) - Progression Note    Patient Details  Name: [name] MRN: [id number] Date of Birth: Mar 26, 1926  [facility] CM/SW [place], [place] Phone Number: 08/18/2019, 2:40 PM  Clinical Narrative:   CSW alerted by [facility] earlier today that they received authorization for patient, but then had changes happen within the building so they no longer have a bed for the patient. CSW met with patient's daughter, Claiborne Billings, at bedside to update her, and the family's second choice would be [facility]. CSW confirmed bed offer at [facility], and they will initiate authorization with [facility]. Patient now awaits authorization request for SNF at [facility]. CSW to follow.    Expected Discharge Plan: [place] Barriers to Discharge: Continued Medical Work up, Ship broker  Expected Discharge Plan and Services Expected Discharge Plan: [place] Choice: [place] arrangements for the past 2 months: Single Family Home                                       Social Determinants of Health (SDOH) Interventions    Readmission Risk Interventions No flowsheet data found.

## 2019-08-18 NOTE — Progress Notes (Signed)
Occupational Therapy Treatment Patient Details Name: [name] MRN: [id number] DOB: 1925/10/24 Today's Date: 08/18/2019    History of present illness 86 yo male s/p fall with dizziness MRI fairly large cerebellar infarct. 4/3 fall striking head PMH afib on eliquis, AAA, CAD, DM, Diverticulosis, Fall 2018 with facial trauma, HTN,    OT comments  Pt high risk of falls at this time due to orthostatic BP noted during session. Please see vitals for vitals. Pt currently supine with family present and Rn addressing with fluid bolus. Pt requires increased assistance compared to 4/2 evaluation and increased slurred speech. Pt only able to complete 1 step commands and previous session automatic with adl task requested. Pt required total +2 mod for transfers with RW and close chair placement due to dizziness.  Recommend continued rehab after acute care d/c.   Follow Up Recommendations  SNF    Equipment Recommendations  Wheelchair (measurements OT);Wheelchair cushion (measurements OT);Hospital bed;3 in 1 bedside commode    Recommendations for Other Services      Precautions / Restrictions Precautions Precautions: Fall Restrictions Weight Bearing Restrictions: No       Mobility Bed Mobility Overal bed mobility: Needs Assistance Bed Mobility: Supine to Sit;Sit to Supine     Supine to sit: Mod assist Sit to supine: +2 for physical assistance;Max assist   General bed mobility comments: pt requires (A) to elevate trunk off bed and sequence task. pt less automatic and increased instruction. pt to return to supine more dependent on therapist with no visual input to task being requested  Transfers Overall transfer level: Needs assistance Equipment used: Rolling walker (2 wheeled) Transfers: Sit to/from Stand Sit to Stand: +2 physical assistance;Mod assist         General transfer comment: pt progressed to total +2 max with decreased BP    Balance Overall balance assessment: Needs  assistance         Standing balance support: Bilateral upper extremity supported;During functional activity Standing balance-Leahy Scale: Zero                             ADL either performed or assessed with clinical judgement   ADL Overall ADL's : Needs assistance/impaired     Grooming: Wash/dry hands;Wash/dry face;Oral care;Maximal assistance;Standing Grooming Details (indicate cue type and reason): pt unable to complete 2 step commands or sequence. pt previously was able to be automatic. pt now requires back ward chaining to complete task with a brisk speed to finish task.      Lower Body Bathing: Total assistance Lower Body Bathing Details (indicate cue type and reason): pt with peri area washed due to incontinence. noted to have odor and discolored. Pt does not attempt to (A) at all and previous session did completely on his own sitting on BSC with guarding.     Lower Body Dressing: Total assistance Lower Body Dressing Details (indicate cue type and reason): don socks supine             Functional mobility during ADLs: +2 for physical assistance;Maximal assistance;Rolling walker General ADL Comments: pt c/o of dizziness. pt noted to have a little nystagmus. pt becoming more verbal about dizziness. therapy began to take orthostatics and found to have decreasing BP. pt started BP after movement 147/59 (85) pt at end of session with RN starting fluids and BP 114/51 (70) with bed tilted/ feet elevated. pt does not tolerate the bed positioned to neutral at this  ime     Vision   Additional Comments: noted to have nystagmus this session . due to closing eyes with orthostatic BP unable to determine direction   Perception     Praxis      Cognition Arousal/Alertness: Awake/alert Behavior During Therapy: Flat affect Overall Cognitive Status: History of cognitive impairments - at baseline                                 General Comments: pt noted to  have more slurred speech, decreased attention to task, decreased response to family present than Friday 4/2. Pt not welcoming the telemetry box placement this session and needed calming cues to don on patient.         Exercises     Shoulder Instructions       General Comments family present then leaving room due to sink level peri care and then returning at the end of session. Family educated of orthostatic and safe return to supine. family educated on safe median arterial BP and pt in a safe zone at this time. RN left to continue to update family and addressing patient care directly    Pertinent Vitals/ Pain       Pain Assessment: No/denies pain  Home Living                                          Prior Functioning/Environment              Frequency  Min 2X/week        Progress Toward Goals  OT Goals(current goals can now be found in the care plan section)  Progress towards OT goals: Not progressing toward goals - comment  Acute Rehab OT Goals Patient Stated Goal: none stated by patient this session OT Goal Formulation: With patient/family Time For Goal Achievement: 08/27/19 Potential to Achieve Goals: Good  Plan Discharge plan needs to be updated    Co-evaluation    PT/OT/SLP Co-Evaluation/Treatment: Yes Reason for Co-Treatment: Complexity of the patient's impairments (multi-system involvement);For patient/therapist safety;To address functional/ADL transfers   OT goals addressed during session: ADL's and self-care      AM-PAC OT "6 Clicks" Daily Activity     Outcome Measure   Help from another person eating meals?: A Lot Help from another person taking care of personal grooming?: A Lot Help from another person toileting, which includes using toliet, bedpan, or urinal?: A Lot Help from another person bathing (including washing, rinsing, drying)?: A Lot Help from another person to put on and taking off regular upper body clothing?: A  Lot Help from another person to put on and taking off regular lower body clothing?: A Lot 6 Click Score: 12    End of Session Equipment Utilized During Treatment: Gait belt;Rolling walker  OT Visit Diagnosis: Unsteadiness on feet (R26.81);Muscle weakness (generalized) (M62.81)   Activity Tolerance Treatment limited secondary to medical complications (Comment)   Patient Left in bed;with call bell/phone within reach;with nursing/sitter in room;with family/visitor present   Nurse Communication Mobility status;Precautions        Time: [id number] OT Time Calculation (min): 41 min  Charges: OT General Charges $OT Visit: 1 Visit OT Treatments $Self Care/Home Management : 23-37 mins   Brynn, OTR/L  Acute Rehabilitation Services Pager: [phone] Office: [phone] .    Jeri Modena  08/18/2019, 4:04 PM

## 2019-08-18 NOTE — Progress Notes (Signed)
Family requesting patient not be woken up for morning CBG check; Family requested alarm to be disabled on the bed due to the ringing agitating the patient.  Patient had scooted to the edge of the bed and RN was unable to reset alarm because family did not want [place] to reposition patient in the bed.  Patient was very agitated demanding that I leave his room immediately.

## 2019-08-18 NOTE — Progress Notes (Signed)
OT NOTE  PT closing eyes and less responsive to therapist. BP sitting in chair after movement 147/59 (85) HR 66. Pt allowed to rest and second attempt, pt closing eyes and continued complaint of dizziness sitting BP 118/60 (75). Pt returned to supine with extended time in supine BP 127/58 and RN notified of BP.  Pt continued to trend with downward BP 107/48 and then bed feet elevated in the bed 112/55. RN Called second time to room to address needs.   Pt is demonstrating signs of orthostatic BP with symptoms of dizziness, eyes occluded and decreased response to staff verbally.     Fleeta Emmer, OTR/L  Acute Rehabilitation Services Pager: [phone] Office: [phone] .

## 2019-08-18 NOTE — Progress Notes (Signed)
Family member upset that patient is peeing so much and believes him to have a UTI; daughter wants results from UA test given tonight and antibiotics given now.  I explained to her that we only have on call staff available for emergency situation and that this is not an emergent situation.  Daughter demanding to see doctor and have antibiotics given now.  Unable to meet family members request will pass it off to charge nurse.

## 2019-08-18 NOTE — Progress Notes (Signed)
PROGRESS NOTE    [name]  KGU:[id number] DOB: 1926/04/14 DOA: 08/12/2019 PCP: Colon Branch, MD  Brief Narrative:  86 year old gentleman prior history of atrial fibrillation on Eliquis, hypertension, hyperlipidemia, type 2 diabetes test complete heart block s/p pacemaker, coronary artery disease s/p CABG and AAA presents with dizziness and recurrent falls and was admitted with working diagnosis of acute CVA.  CT head showed large hypoattenuation of the right cerebellar area.  CT angiogram of the head and neck consistent with right PCA territory infarct.  Neurology consulted and recommended to continue Eliquis in addition to aspirin started on discharge.  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 PT evaluation recommending SNF on discharge.    Dementia without behavioral disturbances  Patient is alert and able to answer simple questions appropriately at this time.   Fall from bed on 08/14/2019 with scalp laceration Fall precautions.      Type 2 diabetes mellitus Hemoglobin A1c at 7.6 Continue with sliding scale insulin.    Hypertension Allow for permissive hypertension in the setting of subacute CVA.    Permanent atrial fibrillation s/p pacemaker for complete heart block Rate control with digoxin and Lopressor and continue with Eliquis for now.   Hypothyroidism Continue with Synthroid 100 MCG daily   Hyperlipidemia LDL at 84, continue  with Pravachol.   Frequent urination anD dysuria since yesterday Urine analysis shows moderate leukocytes with rare bacteria Urine cultures ordered and patient was started on IV Rocephin.   Positive orthostatic vital signs Metoprolol on hold and patient was started on IV fluids. Recheck orthostatic vital signs in the morning.   DVT prophylaxis: Eliquis Code Status: Full code Family Communication: (Family at bedside Disposition Plan:  . Patient came from: Home            . Anticipated d/c place: SNF . Barriers to d/c OR conditions which need to be met to effect a safe d/c: Ongoing treatment for UTI and orthostatic hypotension.   Consultants:   None  Procedures: None Antimicrobials: Rocephin  Subjective: Dizziness later in the day and he was found to be orthostatic.  Objective: Vitals:   08/18/19 1523 08/18/19 1528 08/18/19 1607 08/18/19 1618  BP: (!) 107/48 (!) 112/55 (!) 136/55 (!) 131/55  Pulse:   (!) 59   Resp:   16   Temp:   99.4 F (37.4 C)   TempSrc:   Axillary   SpO2:   96%    No intake or output data in the 24 hours ending 08/18/19 1816 There were no vitals filed for this visit.  Examination:  General exam: Appears calm and comfortable  Respiratory system: Clear to auscultation. Respiratory effort normal. Cardiovascular system: S1 & S2 heard, RRR. No JVD, murmurs, rubs, gallops or clicks. No pedal edema. Gastrointestinal system: Abdomen is nondistended, soft and nontender.  Normal bowel sounds heard. Central nervous system: Alert and oriented. No focal neurological deficits. Extremities: Symmetric 5 x 5 power. Skin: No rashes, lesions or ulcers Psychiatry: Judgement  and insight appear normal. Mood & affect appropriate.     Data Reviewed: I have personally reviewed following labs and imaging studies  CBC: Recent Labs  Lab 08/12/19 1320 08/18/19 0924  WBC 10.7* 14.5*  NEUTROABS  --  12.4*  HGB 14.5 12.0*  HCT 43.6 36.2*  MCV 96.0 96.0  PLT  182 696   Basic Metabolic Panel: Recent Labs  Lab 08/12/19 1320 08/12/19 1520  NA 135  --   K 4.4  --   CL 103  --   CO2 22  --   GLUCOSE 244*  --   BUN 18  --   CREATININE 1.23  --   CALCIUM 9.0  --   MG  --  2.0  PHOS  --  2.6   GFR: CrCl cannot be calculated (Unknown ideal weight.). Liver Function Tests: No results for input(s): AST, ALT, ALKPHOS, BILITOT, PROT, ALBUMIN in the last 168 hours. No results for input(s): LIPASE, AMYLASE in the last 168 hours. No results for input(s): AMMONIA in the last 168 hours. Coagulation Profile: No results for input(s): INR, PROTIME in the last 168 hours. Cardiac Enzymes: No results for input(s): CKTOTAL, CKMB, CKMBINDEX, TROPONINI in the last 168 hours. BNP (last 3 results) No results for input(s): PROBNP in the last 8760 hours. HbA1C: No results for input(s): HGBA1C in the last 72 hours. CBG: Recent Labs  Lab 08/17/19 1558 08/17/19 2219 08/18/19 0814 08/18/19 1116 08/18/19 1604  GLUCAP 127* 188* 137* 202* 122*   Lipid Profile: No results for input(s): CHOL, HDL, LDLCALC, TRIG, CHOLHDL, LDLDIRECT in the last 72 hours. Thyroid Function Tests: No results for input(s): TSH, T4TOTAL, FREET4, T3FREE, THYROIDAB in the last 72 hours. Anemia Panel: No results for input(s): VITAMINB12, FOLATE, FERRITIN, TIBC, IRON, RETICCTPCT in the last 72 hours. Sepsis Labs: No results for input(s): PROCALCITON, LATICACIDVEN in the last 168 hours.  Recent Results (from the past 240 hour(s))  SARS CORONAVIRUS 2 (TAT 6-24 HRS) Nasopharyngeal Nasopharyngeal Swab     Status: None   Collection Time: 08/12/19  6:55 PM   Specimen: Nasopharyngeal Swab  Result Value Ref Range Status   SARS Coronavirus 2 NEGATIVE NEGATIVE Final    Comment: (NOTE) SARS-CoV-2 target nucleic acids are NOT DETECTED. The SARS-CoV-2 RNA is generally detectable in upper and lower respiratory specimens during the acute phase of infection. Negative results do not preclude  SARS-CoV-2 infection, do not rule out co-infections with other pathogens, and should not be used as the sole basis for treatment or other patient management decisions. Negative results must be combined with clinical observations, patient history, and epidemiological information. The expected result is Negative. Fact Sheet for Patients: SugarRoll.be Fact Sheet for Healthcare Providers: https://www.woods-mathews.com/ This test is not yet approved or cleared by the [place] FDA and  has been authorized for detection and/or diagnosis of SARS-CoV-2 by FDA under an Emergency Use Authorization (EUA). This EUA will remain  in effect (meaning this test can be used) for the duration of the COVID-19 declaration under Section 56 4(b)(1) of the Act, 21 U.S.C. section 360bbb-3(b)(1), unless the authorization is terminated or revoked sooner. Performed at [facility], [place] [address], [place], [place] [address]   SARS CORONAVIRUS 2 (TAT 6-24 HRS) Nasopharyngeal Nasopharyngeal Swab     Status: None   Collection Time: 08/17/19  2:41 PM   Specimen: Nasopharyngeal Swab  Result Value Ref Range Status   SARS Coronavirus 2 NEGATIVE NEGATIVE Final    Comment: (NOTE) SARS-CoV-2 target  nucleic acids are NOT DETECTED. The SARS-CoV-2 RNA is generally detectable in upper and lower respiratory specimens during the acute phase of infection. Negative results do not preclude SARS-CoV-2 infection, do not rule out co-infections with other pathogens, and should not be used as the sole basis for treatment or other patient management decisions. Negative results must be combined with clinical observations, patient history, and epidemiological information. The expected result is Negative. Fact Sheet for Patients: SugarRoll.be Fact Sheet for Healthcare Providers: https://www.woods-mathews.com/ This test is not yet approved or  cleared by the [place] FDA and  has been authorized for detection and/or diagnosis of SARS-CoV-2 by FDA under an Emergency Use Authorization (EUA). This EUA will remain  in effect (meaning this test can be used) for the duration of the COVID-19 declaration under Section 56 4(b)(1) of the Act, 21 U.S.C. section 360bbb-3(b)(1), unless the authorization is terminated or revoked sooner. Performed at [facility], [place] [address], [place], [place] [address]          Radiology Studies: No results found.      Scheduled Meds: . apixaban  5 mg Oral BID  . digoxin  0.125 mg Oral QODAY  . escitalopram  10 mg Oral Daily  . fenofibrate  54 mg Oral Daily  . insulin aspart  0-5 Units Subcutaneous QHS  . insulin aspart  0-9 Units Subcutaneous TID WC  . levothyroxine  100 mcg Oral QAC breakfast  . loratadine  10 mg Oral Daily  . mirtazapine  15 mg Oral QHS  . pantoprazole  40 mg Oral QAC breakfast  . pravastatin  80 mg Oral Daily   Continuous Infusions: . sodium chloride 100 mL/hr at 08/18/19 1811  . [START ON 08/19/2019] cefTRIAXone (ROCEPHIN)  IV       LOS: 5 days        Hosie Poisson, MD [facility]   To contact the attending provider between 7A-7P or the covering provider during after hours 7P-7A, please log into the web site [email or web address] and access using universal [place] password for that web site. If you do not have the password, please call the hospital operator.  08/18/2019, 6:16 PM

## 2019-08-19 DIAGNOSIS — E785 Hyperlipidemia, unspecified: Secondary | ICD-10-CM | POA: Diagnosis not present

## 2019-08-19 DIAGNOSIS — R2681 Unsteadiness on feet: Secondary | ICD-10-CM | POA: Diagnosis not present

## 2019-08-19 DIAGNOSIS — E039 Hypothyroidism, unspecified: Secondary | ICD-10-CM | POA: Diagnosis not present

## 2019-08-19 DIAGNOSIS — E119 Type 2 diabetes mellitus without complications: Secondary | ICD-10-CM | POA: Diagnosis not present

## 2019-08-19 DIAGNOSIS — I951 Orthostatic hypotension: Secondary | ICD-10-CM | POA: Diagnosis not present

## 2019-08-19 DIAGNOSIS — I639 Cerebral infarction, unspecified: Secondary | ICD-10-CM | POA: Diagnosis not present

## 2019-08-19 DIAGNOSIS — F0390 Unspecified dementia without behavioral disturbance: Secondary | ICD-10-CM | POA: Diagnosis not present

## 2019-08-19 DIAGNOSIS — M6281 Muscle weakness (generalized): Secondary | ICD-10-CM | POA: Diagnosis not present

## 2019-08-19 DIAGNOSIS — F329 Major depressive disorder, single episode, unspecified: Secondary | ICD-10-CM | POA: Diagnosis not present

## 2019-08-19 DIAGNOSIS — N390 Urinary tract infection, site not specified: Secondary | ICD-10-CM | POA: Diagnosis not present

## 2019-08-19 DIAGNOSIS — Z20828 Contact with and (suspected) exposure to other viral communicable diseases: Secondary | ICD-10-CM | POA: Diagnosis not present

## 2019-08-19 DIAGNOSIS — E118 Type 2 diabetes mellitus with unspecified complications: Secondary | ICD-10-CM | POA: Diagnosis not present

## 2019-08-19 DIAGNOSIS — R41841 Cognitive communication deficit: Secondary | ICD-10-CM | POA: Diagnosis not present

## 2019-08-19 DIAGNOSIS — R296 Repeated falls: Secondary | ICD-10-CM | POA: Diagnosis not present

## 2019-08-19 DIAGNOSIS — Z7401 Bed confinement status: Secondary | ICD-10-CM | POA: Diagnosis not present

## 2019-08-19 DIAGNOSIS — I4821 Permanent atrial fibrillation: Secondary | ICD-10-CM | POA: Diagnosis not present

## 2019-08-19 DIAGNOSIS — I63441 Cerebral infarction due to embolism of right cerebellar artery: Secondary | ICD-10-CM | POA: Diagnosis not present

## 2019-08-19 DIAGNOSIS — R404 Transient alteration of awareness: Secondary | ICD-10-CM | POA: Diagnosis not present

## 2019-08-19 DIAGNOSIS — I10 Essential (primary) hypertension: Secondary | ICD-10-CM | POA: Diagnosis not present

## 2019-08-19 DIAGNOSIS — M2550 Pain in unspecified joint: Secondary | ICD-10-CM | POA: Diagnosis not present

## 2019-08-19 DIAGNOSIS — F05 Delirium due to known physiological condition: Secondary | ICD-10-CM | POA: Diagnosis not present

## 2019-08-19 DIAGNOSIS — I4891 Unspecified atrial fibrillation: Secondary | ICD-10-CM | POA: Diagnosis not present

## 2019-08-19 LAB — GLUCOSE, CAPILLARY
Glucose-Capillary: 123 mg/dL — ABNORMAL HIGH (ref 70–99)
Glucose-Capillary: 167 mg/dL — ABNORMAL HIGH (ref 70–99)

## 2019-08-19 LAB — CBC
HCT: 34.2 % — ABNORMAL LOW (ref 39.0–52.0)
Hemoglobin: 11.2 g/dL — ABNORMAL LOW (ref 13.0–17.0)
MCH: 31.4 pg (ref 26.0–34.0)
MCHC: 32.7 g/dL (ref 30.0–36.0)
MCV: 95.8 fL (ref 80.0–100.0)
Platelets: 162 10*3/uL (ref 150–400)
RBC: 3.57 MIL/uL — ABNORMAL LOW (ref 4.22–5.81)
RDW: 13.3 % (ref 11.5–15.5)
WBC: 13.7 10*3/uL — ABNORMAL HIGH (ref 4.0–10.5)
nRBC: 0 % (ref 0.0–0.2)

## 2019-08-19 LAB — BASIC METABOLIC PANEL
Anion gap: 12 (ref 5–15)
BUN: 23 mg/dL (ref 8–23)
CO2: 20 mmol/L — ABNORMAL LOW (ref 22–32)
Calcium: 8.2 mg/dL — ABNORMAL LOW (ref 8.9–10.3)
Chloride: 104 mmol/L (ref 98–111)
Creatinine, Ser: 1.27 mg/dL — ABNORMAL HIGH (ref 0.61–1.24)
GFR calc Af Amer: 57 mL/min — ABNORMAL LOW (ref >60)
GFR calc non Af Amer: 49 mL/min — ABNORMAL LOW (ref >60)
Glucose, Bld: 136 mg/dL — ABNORMAL HIGH (ref 70–99)
Potassium: 4.1 mmol/L (ref 3.5–5.1)
Sodium: 136 mmol/L (ref 135–145)

## 2019-08-19 MED ORDER — CEPHALEXIN 500 MG PO CAPS: 500.0000 mg | ORAL_CAPSULE | Freq: Two times a day (BID) | ORAL | 0 refills | Status: AC

## 2019-08-19 MED ORDER — SENNOSIDES-DOCUSATE SODIUM 8.6-50 MG PO TABS: 1.0000 | ORAL_TABLET | Freq: Every evening | ORAL | Status: AC | PRN

## 2019-08-19 MED ORDER — METOPROLOL TARTRATE 25 MG PO TABS: 12.5000 mg | ORAL_TABLET | Freq: Two times a day (BID) | ORAL | 0 refills | Status: AC

## 2019-08-19 MED ORDER — SODIUM CHLORIDE 0.9 % IV SOLN: 1.0000 g | Freq: Once | INTRAVENOUS | Status: DC

## 2019-08-19 MED ORDER — DORZOLAMIDE HCL-TIMOLOL MAL 2-0.5 % OP SOLN: 1.0000 [drp] | Freq: Two times a day (BID) | OPHTHALMIC | 12 refills | Status: AC

## 2019-08-19 MED ORDER — INSULIN ASPART 100 UNIT/ML ~~LOC~~ SOLN: SUBCUTANEOUS | 11 refills | Status: AC

## 2019-08-19 MED ORDER — DORZOLAMIDE HCL-TIMOLOL MAL 2-0.5 % OP SOLN: 1.0000 [drp] | Freq: Two times a day (BID) | OPHTHALMIC | Status: DC

## 2019-08-19 NOTE — Progress Notes (Signed)
  Speech Language Pathology Treatment: Cognitive-Linquistic  Patient Details Name: [name] MRN: [id number] DOB: 19-Feb-1926 Today's Date: 08/19/2019 Time: [id number] SLP Time Calculation (min) (ACUTE ONLY): 23 min  Assessment / Plan / Recommendation Clinical Impression  Pt was seen for skilled ST targeting diagnostic treatment and cognitive-linguistic deficits.  Pt was encountered awake/alert with daughter present at bedside.  Daughter reported that they plan for the pt to discharge to a SNF for additional therapy and assistance.  Pt was lethargic, but cooperative and pleasant throughout this tx session.  He was independently oriented to himself, but not to the year, date, place, or city.  Given a choice of two, he was able to correctly identify the year and place.  He completed additional language evaluation tasks that were adapted from the [facility] and he demonstrated difficulty with 2-3 step commands (receptive language task).  He was also noted to have intermittent disfluencies in his speech which his daughter reported is not his baseline.  He completed a 3 word recall task targeting short-term memory and he was able to recall 1/3 words independently following a 5 minute delay, improving to 2/3 given a choice of 3.  He additionally completed sustained attention tasks without difficulty and answered all numeric problem solving questions with 100% accuracy independently.  Pt's speech was approximately 90% intelligible to an unfamiliar listener on this date.  SLP will f/u per POC.  Recommend additional ST targeting cognitive-linguistic deficits and 24/7 supervision and assistance at time of discharge.     HPI HPI: 86 yo male s/p fall with dizziness MRI fairly large cerebellar infarct. PMH afib on eliquis, AAA, CAD, DM, Diverticulosis, Fall 2018 with facial trauma, HTN      SLP Plan  Continue with current plan of care       Recommendations                   Oral Care Recommendations:  Oral care BID Follow up Recommendations: Skilled Nursing facility SLP Visit Diagnosis: Cognitive communication deficit [id number]) Plan: Continue with current plan of care        Colin Mulders M.S., [place] Office: [phone]  [place] 08/19/2019, 11:36 AM

## 2019-08-19 NOTE — Progress Notes (Signed)
Report called to [place] at [facility]

## 2019-08-19 NOTE — Plan of Care (Signed)
Adequate for discharge.

## 2019-08-19 NOTE — Discharge Instructions (Signed)

## 2019-08-19 NOTE — Discharge Summary (Addendum)
Physician Discharge Summary  [name] QIH:[id number] DOB: December 25, 1925 DOA: 08/12/2019  PCP: Colon Branch, MD  Admit date: 08/12/2019 Discharge date: 08/19/2019  Admitted From:HOME.  Disposition:  SNF  Recommendations for Outpatient Follow-up:  1. Follow up with PCP in 1-2 weeks 2. Please obtain BMP/CBC in one week 3. Please follow up with neurology in 6 weeks.  4. We have stopped your lantus as your A1c is 7.6. please follow up with PCP regarding that.  5. Please follow up the urine cultures with PCP.  6. Please follow up with Urology regarding BPH.     Discharge Condition: GUARDED.  CODE STATUS:FULL CODE.  Diet recommendation: Heart Healthy   Brief/Interim Summary: 86 year old gentleman prior history of atrial fibrillation on Eliquis, hypertension, hyperlipidemia, type 2 diabetes test complete heart block s/p pacemaker, coronary artery disease s/p CABG and AAA presents with dizziness and recurrent falls and was admitted with working diagnosis of acute CVA.  CT head showed large hypoattenuation of the right cerebellar area.  CT angiogram of the head and neck consistent with right PCA territory infarct.  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 PT evaluation recommending SNF on discharge.    Dementia without behavioral disturbances  Patient is alert and able to answer simple questions  appropriately at this time.   Fall from bed on 08/14/2019 with scalp laceration Fall precautions.      Type 2 diabetes mellitus Hemoglobin A1c at 7.6 Continue with sliding scale insulin on discharge.    Hypertension Allow for permissive hypertension in the setting of subacute CVA.    Permanent atrial fibrillation s/p pacemaker for complete heart block Rate control with digoxin and Lopressor and continue with Eliquis for now.   Hypothyroidism Continue with Synthroid 100 MCG daily   Hyperlipidemia LDL at 84, continue with Pravachol.   Frequent urination anD dysuria since yesterday Urine analysis shows moderate leukocytes with rare bacteria Urine cultures ordered and patient was given 2 doses of rocephin and discharged on oral antibiotics to complete the course.    Positive orthostatic vital signs Metoprolol on hold and patient was started on IV fluids. Recheck orthostatic vital signs in the morning wnl. Decreased the dose of metoprolol to 12.5 mg BID.   Discharge Instructions  Discharge Instructions    Diet - low sodium heart healthy   Complete by: As directed    Discharge instructions   Complete by: As directed    Please follow up with PCP in 1 to 2 weeks.     Allergies as of 08/19/2019      Reactions   Ace Inhibitors Cough   Codeine Phosphate Nausea Only   Hydrochlorothiazide W-triamterene Other (See Comments)   CRAMPING      Medication List    STOP taking these medications   Accu-Chek FastClix Lancets Misc   insulin glargine 100 UNIT/ML Solostar Pen Commonly known as: Lantus SoloStar   ipratropium 0.06 % nasal spray Commonly known as: ATROVENT  omeprazole 40 MG capsule Commonly known as: PRILOSEC   vitamin B-12 1000 MCG tablet Commonly known as: CYANOCOBALAMIN     TAKE these medications   Accu-Chek Guide w/Device Kit 1 Device by Does not apply route daily.   apixaban 5 MG Tabs tablet Commonly known as: Eliquis Take 1  tablet (5 mg total) by mouth 2 (two) times daily.   azelastine 0.1 % nasal spray Commonly known as: ASTELIN Place 2 sprays into both nostrils at bedtime as needed for rhinitis or allergies. Use in each nostril as directed   cephALEXin 500 MG capsule Commonly known as: KEFLEX Take 1 capsule (500 mg total) by mouth 2 (two) times daily for 5 days.   digoxin 0.125 MG tablet Commonly known as: LANOXIN TAKE 1 TABLET BY MOUTH EVERY OTHER DAY   dorzolamide-timolol 22.3-6.8 MG/ML ophthalmic solution Commonly known as: COSOPT Place 1 drop into both eyes 2 (two) times daily.   escitalopram 10 MG tablet Commonly known as: LEXAPRO Take 1 tablet (10 mg total) by mouth daily.   fenofibrate micronized 134 MG capsule Commonly known as: LOFIBRA Take 1 capsule (134 mg total) by mouth daily.   glucose blood test strip Commonly known as: Accu-Chek Guide Used to check blood sugars 2x daily.   insulin aspart 100 UNIT/ML injection Commonly known as: novoLOG CBG 70 - 120: 0 units CBG 121 - 150: 1 unit CBG 151 - 200: 2 units CBG 201 - 250: 3 units CBG 251 - 300: 5 units CBG 301 - 350: 7 units CBG 351 - 400: 9 units   Insulin Pen Needle 31G X 6 MM Misc To use w/ Lantus What changed:   how much to take  how to take this  when to take this  additional instructions   levothyroxine 100 MCG tablet Commonly known as: SYNTHROID Take 1 tablet (100 mcg total) by mouth daily before breakfast.   loratadine 10 MG tablet Commonly known as: CLARITIN Take 10 mg by mouth daily.   metoprolol tartrate 25 MG tablet Commonly known as: LOPRESSOR Take 0.5 tablets (12.5 mg total) by mouth 2 (two) times daily. What changed: how much to take   mirtazapine 15 MG tablet Commonly known as: REMERON Take 1 tablet (15 mg total) by mouth at bedtime.   nitroGLYCERIN 0.4 MG SL tablet Commonly known as: NITROSTAT Place 1 tablet (0.4 mg total) under the tongue every 5 (five) minutes x 3 doses as needed for  chest pain. Then contact 911 or go to ER   pantoprazole 40 MG tablet Commonly known as: PROTONIX Take 1 tablet (40 mg total) by mouth daily before breakfast.   pravastatin 40 MG tablet Commonly known as: PRAVACHOL Take 2 tablets (80 mg total) by mouth daily.   senna-docusate 8.6-50 MG tablet Commonly known as: Senokot-S Take 1 tablet by mouth at bedtime as needed for moderate constipation.   sitaGLIPtin 100 MG tablet Commonly known as: Januvia Take 1 tablet (100 mg total) by mouth daily.       Contact information for follow-up providers    Colon Branch, MD. Schedule an appointment as soon as possible for a visit in 1 week(s).   Specialty: Internal Medicine Contact information: [address] [place] [address] [phone]        Evans Lance, MD .   Specialty: Cardiology Contact information: [phone] [address] [place] [address] [phone]            Contact  information for after-discharge care    Destination    [facility] .   Service: Skilled Nursing Contact information: [address] [place] [phone]                 Allergies  Allergen Reactions  . Ace Inhibitors Cough  . Codeine Phosphate Nausea Only  . Hydrochlorothiazide W-Triamterene Other (See Comments)    CRAMPING    Consultations:  Neurology.    Procedures/Studies: CT ANGIO HEAD W OR WO CONTRAST  Result Date: 08/13/2019 CLINICAL DATA:  Stroke follow-up EXAM: CT ANGIOGRAPHY HEAD AND NECK TECHNIQUE: Multidetector CT imaging of the head and neck was performed using the standard protocol during bolus administration of intravenous contrast. Multiplanar CT image reconstructions and MIPs were obtained to evaluate the vascular anatomy. Carotid stenosis measurements (when applicable) are obtained utilizing NASCET criteria, using the distal internal carotid diameter as the denominator. CONTRAST:  40m OMNIPAQUE  IOHEXOL 350 MG/ML SOLN COMPARISON:  Head CT from yesterday FINDINGS: CTA NECK FINDINGS Aortic arch: Multifocal atherosclerotic plaque. No aneurysm or dissection. Two vessel arch branching. Partially covered CABG including the LIMA. Right carotid system: Calcified plaque at the bifurcation primarily affecting the ECA origin. No flow limiting stenosis in the common or internal carotid arteries. No ulceration. Subtle but convincing mid ICA beading. Left carotid system: Calcified plaque at the bifurcation primarily on the posterior wall. No flow limiting stenosis or ulceration. There is distal ICA tortuosity with looping. Mild beaded appearance to the mid left ICA. Vertebral arteries: Proximal subclavian atherosclerosis or ulceration without flow limiting stenosis.Vertebral arteries are smooth and widely patent. Skeleton: Advanced cervical spine degeneration with multilevel ankylosis. Other neck: No evidence of mass or adenopathy. Deformity of the thyroid cartilage and larynx which appears posttraumatic. Upper chest: Negative Review of the MIP images confirms the above findings CTA HEAD FINDINGS Anterior circulation: Atherosclerotic plaque on the carotid siphons without focal or high-grade stenosis. 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 Extensive atheromatous irregularity of medium size vessels. Negative for aneurysm or vascular malformation. Posterior circulation: Slight dominance of the right vertebral artery. Both picas are symmetrically patent. Basilar is sufficiently patent. Fetal type right PCA flow. Moderate atheromatous irregularity of posterior cerebral arteries. Negative for aneurysm Venous sinuses: Unremarkable as permitted by contrast timing Anatomic variants: None significant. A noncontrast phase was not acquired. There is a known right PICA territory infarct with fourth ventricular narrowing inferiorly. Ventricular volume appears stable Review of the MIP images confirms the  above findings IMPRESSION: 1. Acute right PICA territory infarct with lower fourth ventricular narrowing but no hydrocephalus currently. 2. No acute vascular finding. There is symmetric flow in both picas and no underlying atheromatous flow limiting stenosis or ulceration. 3. Extensive intracranial atherosclerosis affecting medium size vessels. 4. Mild fibromuscular dysplasia seen at the cervical ICAs. Electronically Signed   By: JMonte FantasiaM.D.   On: 08/13/2019 07:16   DG Chest 1 View  Result Date: 08/14/2019 CLINICAL DATA:  Pain following fall EXAM: CHEST  1 VIEW COMPARISON:  July 07, 2017 FINDINGS: The lungs are clear. There is cardiomegaly with pulmonary vascularity within normal limits. Pacemaker leads attached to the right heart, stable. Patient is status post coronary artery bypass grafting. No pneumothorax. No bone lesions. IMPRESSION: Stable cardiomegaly. Postoperative changes. Stable pacemaker lead placements. Lungs clear. Electronically Signed   By: WLowella GripIII M.D.   On: 08/14/2019 11:03   CT HEAD WO CONTRAST  Result Date:  08/14/2019 CLINICAL DATA:  Follow-up head trauma.  Head trauma. EXAM: CT HEAD WITHOUT CONTRAST TECHNIQUE: Contiguous axial images were obtained from the base of the skull through the vertex without intravenous contrast. COMPARISON:  Head CT from 2 days ago FINDINGS: Brain: Acute to early subacute right PICA territory infarct with stable extent of low-density. Effacement of the lower fourth ventricle without hydrocephalus or increasing ventricular volume. Along the lower aspect of the infarct is vague high-density that maintains cerebellar architecture. Extensive chronic small vessel ischemia with confluent low-density in the deep white matter and remote lacunar infarcts at the basal ganglia. No hemorrhage. Vascular: Atherosclerotic calcification Skull: Unremarkable Sinuses/Orbits: Bilateral cataract resection IMPRESSION: Right PICA territory infarct with  petechial hemorrhage and fourth ventricular narrowing. No hydrocephalus or interval change. Electronically Signed   By: Monte Fantasia M.D.   On: 08/14/2019 07:38   CT Head Wo Contrast  Result Date: 08/12/2019 CLINICAL DATA:  Dizziness for a few weeks which has worsened over the last few days. No known injury. EXAM: CT HEAD WITHOUT CONTRAST TECHNIQUE: Contiguous axial images were obtained from the base of the skull through the vertex without intravenous contrast. COMPARISON:  Head CT scan 09/07/2015. FINDINGS: Brain: There is a large area of hypoattenuation in the right cerebellar hemisphere extending to the cortex most consistent with an infarct. Atrophy and extensive chronic microvascular ischemic change have progressed since the prior MRI. No hemorrhage, midline shift or abnormal extra-axial fluid collection. No hydrocephalus or pneumocephalus. Vascular: No hyperdense vessel is identified. Atherosclerosis is noted. Skull: Intact.  No focal lesion. Sinuses/Orbits: Status post cataract surgery.  Otherwise negative. Other: None. IMPRESSION: Larger of hypoattenuation in the right cerebellar hemisphere extending to the cortex is most consistent with an acute or early subacute infarct. Atrophy and extensive chronic microvascular ischemic change have markedly worsened since the prior head CT. Atherosclerosis. Electronically Signed   By: Inge Rise M.D.   On: 08/12/2019 16:45   CT ANGIO NECK W OR WO CONTRAST  Result Date: 08/13/2019 CLINICAL DATA:  Stroke follow-up EXAM: CT ANGIOGRAPHY HEAD AND NECK TECHNIQUE: Multidetector CT imaging of the head and neck was performed using the standard protocol during bolus administration of intravenous contrast. Multiplanar CT image reconstructions and MIPs were obtained to evaluate the vascular anatomy. Carotid stenosis measurements (when applicable) are obtained utilizing NASCET criteria, using the distal internal carotid diameter as the denominator. CONTRAST:  51m  OMNIPAQUE IOHEXOL 350 MG/ML SOLN COMPARISON:  Head CT from yesterday FINDINGS: CTA NECK FINDINGS Aortic arch: Multifocal atherosclerotic plaque. No aneurysm or dissection. Two vessel arch branching. Partially covered CABG including the LIMA. Right carotid system: Calcified plaque at the bifurcation primarily affecting the ECA origin. No flow limiting stenosis in the common or internal carotid arteries. No ulceration. Subtle but convincing mid ICA beading. Left carotid system: Calcified plaque at the bifurcation primarily on the posterior wall. No flow limiting stenosis or ulceration. There is distal ICA tortuosity with looping. Mild beaded appearance to the mid left ICA. Vertebral arteries: Proximal subclavian atherosclerosis or ulceration without flow limiting stenosis.Vertebral arteries are smooth and widely patent. Skeleton: Advanced cervical spine degeneration with multilevel ankylosis. Other neck: No evidence of mass or adenopathy. Deformity of the thyroid cartilage and larynx which appears posttraumatic. Upper chest: Negative Review of the MIP images confirms the above findings CTA HEAD FINDINGS Anterior circulation: Atherosclerotic plaque on the carotid siphons without focal or high-grade stenosis. 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 Extensive atheromatous irregularity of medium  size vessels. Negative for aneurysm or vascular malformation. Posterior circulation: Slight dominance of the right vertebral artery. Both picas are symmetrically patent. Basilar is sufficiently patent. Fetal type right PCA flow. Moderate atheromatous irregularity of posterior cerebral arteries. Negative for aneurysm Venous sinuses: Unremarkable as permitted by contrast timing Anatomic variants: None significant. A noncontrast phase was not acquired. There is a known right PICA territory infarct with fourth ventricular narrowing inferiorly. Ventricular volume appears stable Review of the MIP images  confirms the above findings IMPRESSION: 1. Acute right PICA territory infarct with lower fourth ventricular narrowing but no hydrocephalus currently. 2. No acute vascular finding. There is symmetric flow in both picas and no underlying atheromatous flow limiting stenosis or ulceration. 3. Extensive intracranial atherosclerosis affecting medium size vessels. 4. Mild fibromuscular dysplasia seen at the cervical ICAs. Electronically Signed   By: Monte Fantasia M.D.   On: 08/13/2019 07:16   ECHOCARDIOGRAM COMPLETE  Result Date: 08/13/2019    ECHOCARDIOGRAM REPORT   Patient Name:   KEEFER SOULLIERE [facility] Date of Exam: 08/13/2019 Medical Rec #:  [id number]   Height:       67.0 in Accession #:    [id number]  Weight:       148.0 lb Date of Birth:  [birth year]  BSA:          1.779 m Patient Age:    86 years    BP:           129/64 mmHg Patient Gender: M           HR:           59 bpm. Exam Location:  Inpatient Procedure: 2D Echo Indications:    Stroke 434.91 / I163.9  History:        Patient has prior history of Echocardiogram examinations, most                 recent 08/09/2016. Pacemaker, Arrythmias:Atrial Fibrillation;                 Risk Factors:Diabetes, Hypertension and Dyslipidemia.  Sonographer:    Vikki Ports Turrentine Referring Phys: [place]  Sonographer Comments: Suboptimal subcostal window. Image acquisition challenging due to patient body habitus and Image acquisition challenging due to uncooperative patient. IMPRESSIONS  1. Normal LV systolic function; moderate LVH; mild AI; mild LAE.  2. Left ventricular ejection fraction, by estimation, is 60 to 65%. The left ventricle has normal function. The left ventricle has no regional wall motion abnormalities. There is moderate left ventricular hypertrophy. Left ventricular diastolic parameters are indeterminate.  3. Right ventricular systolic function is normal. The right ventricular size is normal.  4. Left atrial size was mildly dilated.  5. The mitral valve is  normal in structure. Trivial mitral valve regurgitation. No evidence of mitral stenosis.  6. The aortic valve is tricuspid. Aortic valve regurgitation is mild. Mild to moderate aortic valve sclerosis/calcification is present, without any evidence of aortic stenosis. FINDINGS  Left Ventricle: Left ventricular ejection fraction, by estimation, is 60 to 65%. The left ventricle has normal function. The left ventricle has no regional wall motion abnormalities. The left ventricular internal cavity size was normal in size. There is  moderate left ventricular hypertrophy. Left ventricular diastolic parameters are indeterminate. Right Ventricle: The right ventricular size is normal. Right ventricular systolic function is normal. Left Atrium: Left atrial size was mildly dilated. Right Atrium: Right atrial size was normal in size. Pericardium: There is no evidence of pericardial effusion. Mitral Valve:  The mitral valve is normal in structure. Normal mobility of the mitral valve leaflets. Mild mitral annular calcification. Trivial mitral valve regurgitation. No evidence of mitral valve stenosis. Tricuspid Valve: The tricuspid valve is normal in structure. Tricuspid valve regurgitation is trivial. No evidence of tricuspid stenosis. Aortic Valve: The aortic valve is tricuspid. Aortic valve regurgitation is mild. Mild to moderate aortic valve sclerosis/calcification is present, without any evidence of aortic stenosis. Aortic valve mean gradient measures 4.0 mmHg. Aortic valve peak gradient measures 7.8 mmHg. Aortic valve area, by VTI measures 2.01 cm. Pulmonic Valve: The pulmonic valve was not well visualized. Pulmonic valve regurgitation is not visualized. No evidence of pulmonic stenosis. Aorta: The aortic root is normal in size and structure. Venous: The inferior vena cava was not well visualized.  Additional Comments: Normal LV systolic function; moderate LVH; mild AI; mild LAE. A pacer wire is visualized.  LEFT VENTRICLE  PLAX 2D LVIDd:         4.70 cm LVIDs:         3.20 cm LV PW:         1.40 cm LV IVS:        1.40 cm LVOT diam:     2.00 cm LV SV:         47 LV SV Index:   27 LVOT Area:     3.14 cm  RIGHT VENTRICLE TAPSE (M-mode): 1.2 cm LEFT ATRIUM             Index LA diam:        5.50 cm 3.09 cm/m LA Vol (A2C):   61.6 ml 34.62 ml/m LA Vol (A4C):   73.8 ml 41.48 ml/m LA Biplane Vol: 69.3 ml 38.95 ml/m  AORTIC VALVE AV Area (Vmax):    1.68 cm AV Area (Vmean):   1.83 cm AV Area (VTI):     2.01 cm AV Vmax:           140.00 cm/s AV Vmean:          90.000 cm/s AV VTI:            0.236 m AV Peak Grad:      7.8 mmHg AV Mean Grad:      4.0 mmHg LVOT Vmax:         74.80 cm/s LVOT Vmean:        52.500 cm/s LVOT VTI:          0.151 m LVOT/AV VTI ratio: 0.64  AORTA Ao Root diam: 3.20 cm MITRAL VALVE               TRICUSPID VALVE MV Area (PHT): 3.50 cm    TR Peak grad:   15.1 mmHg MV Decel Time: 217 msec    TR Vmax:        194.00 cm/s MV E velocity: 74.80 cm/s MV A velocity: 63.30 cm/s  SHUNTS MV E/A ratio:  1.18        Systemic VTI:  0.15 m                            Systemic Diam: 2.00 cm Kirk Ruths MD Electronically signed by Kirk Ruths MD Signature Date/Time: 08/13/2019/1:28:37 PM    Final    CUP PACEART REMOTE DEVICE CHECK  Result Date: 07/23/2019 Scheduled remote reviewed. Normal device function.  ERI estimated in 7 months. Will continue monthly battery checks. Felisa Bonier, RN, MSN  DG HIP UNILAT WITH PELVIS  2-3 VIEWS RIGHT  Result Date: 08/14/2019 CLINICAL DATA:  Pain following fall EXAM: DG HIP (WITH OR WITHOUT PELVIS) 2-3V RIGHT COMPARISON:  None. FINDINGS: Frontal pelvis as well as frontal and lateral right hip images were obtained. Bones are osteoporotic. There is a total hip replacement on the right with prosthetic components well-seated. No fracture or dislocation. No erosive change. There is slight narrowing of the left hip joint. Penile prosthesis present. IMPRESSION: Status post total hip replacement on the  right with prosthetic components well-seated. No fracture or dislocation. Mild narrowing left hip joint. Electronically Signed   By: Lowella Grip III M.D.   On: 08/14/2019 11:02      Subjective:  No chest pain or sob. No dysuria.  Discharge Exam: Vitals:   08/19/19 1153 08/19/19 1156  BP: (!) 149/61 (!) 156/60  Pulse: 64 62  Resp: 20 20  Temp:    SpO2: 100% 98%   Vitals:   08/19/19 1100 08/19/19 1149 08/19/19 1153 08/19/19 1156  BP:  (!) 145/57 (!) 149/61 (!) 156/60  Pulse:  61 64 62  Resp:  '18 20 20  ' Temp:  99.1 F (37.3 C)    TempSrc:  Oral    SpO2:  99% 100% 98%  Weight: 68 kg     Height: '5\' 6"'  (1.676 m)       General: Pt is alert, awake, not in acute distress Cardiovascular: RRR, S1/S2 +, no rubs, no gallops Respiratory: CTA bilaterally, no wheezing, no rhonchi Abdominal: Soft, NT, ND, bowel sounds + Extremities: no edema, no cyanosis    The results of significant diagnostics from this hospitalization (including imaging, microbiology, ancillary and laboratory) are listed below for reference.     Microbiology: Recent Results (from the past 240 hour(s))  SARS CORONAVIRUS 2 (TAT 6-24 HRS) Nasopharyngeal Nasopharyngeal Swab     Status: None   Collection Time: 08/12/19  6:55 PM   Specimen: Nasopharyngeal Swab  Result Value Ref Range Status   SARS Coronavirus 2 NEGATIVE NEGATIVE Final    Comment: (NOTE) SARS-CoV-2 target nucleic acids are NOT DETECTED. The SARS-CoV-2 RNA is generally detectable in upper and lower respiratory specimens during the acute phase of infection. Negative results do not preclude SARS-CoV-2 infection, do not rule out co-infections with other pathogens, and should not be used as the sole basis for treatment or other patient management decisions. Negative results must be combined with clinical observations, patient history, and epidemiological information. The expected result is Negative. Fact Sheet for  Patients: SugarRoll.be Fact Sheet for Healthcare Providers: https://www.woods-mathews.com/ This test is not yet approved or cleared by the [place] FDA and  has been authorized for detection and/or diagnosis of SARS-CoV-2 by FDA under an Emergency Use Authorization (EUA). This EUA will remain  in effect (meaning this test can be used) for the duration of the COVID-19 declaration under Section 56 4(b)(1) of the Act, 21 U.S.C. section 360bbb-3(b)(1), unless the authorization is terminated or revoked sooner. Performed at [facility], [place] [address], [place], [place] [address]   SARS CORONAVIRUS 2 (TAT 6-24 HRS) Nasopharyngeal Nasopharyngeal Swab     Status: None   Collection Time: 08/17/19  2:41 PM   Specimen: Nasopharyngeal Swab  Result Value Ref Range Status   SARS Coronavirus 2 NEGATIVE NEGATIVE Final    Comment: (NOTE) SARS-CoV-2 target nucleic acids are NOT DETECTED. The SARS-CoV-2 RNA is generally detectable in upper and lower respiratory specimens during the acute phase of infection. Negative results do not preclude SARS-CoV-2  infection, do not rule out co-infections with other pathogens, and should not be used as the sole basis for treatment or other patient management decisions. Negative results must be combined with clinical observations, patient history, and epidemiological information. The expected result is Negative. Fact Sheet for Patients: SugarRoll.be Fact Sheet for Healthcare Providers: https://www.woods-mathews.com/ This test is not yet approved or cleared by the [place] FDA and  has been authorized for detection and/or diagnosis of SARS-CoV-2 by FDA under an Emergency Use Authorization (EUA). This EUA will remain  in effect (meaning this test can be used) for the duration of the COVID-19 declaration under Section 56 4(b)(1) of the Act, 21 U.S.C. section  360bbb-3(b)(1), unless the authorization is terminated or revoked sooner. Performed at [facility], [place] [address], [place], [place] [address]      Labs: BNP (last 3 results) No results for input(s): BNP in the last 8760 hours. Basic Metabolic Panel: Recent Labs  Lab 08/12/19 1520 08/19/19 0344  NA  --  136  K  --  4.1  CL  --  104  CO2  --  20*  GLUCOSE  --  136*  BUN  --  23  CREATININE  --  1.27*  CALCIUM  --  8.2*  MG 2.0  --   PHOS 2.6  --    Liver Function Tests: No results for input(s): AST, ALT, ALKPHOS, BILITOT, PROT, ALBUMIN in the last 168 hours. No results for input(s): LIPASE, AMYLASE in the last 168 hours. No results for input(s): AMMONIA in the last 168 hours. CBC: Recent Labs  Lab 08/18/19 0924 08/19/19 0344  WBC 14.5* 13.7*  NEUTROABS 12.4*  --   HGB 12.0* 11.2*  HCT 36.2* 34.2*  MCV 96.0 95.8  PLT 162 162   Cardiac Enzymes: No results for input(s): CKTOTAL, CKMB, CKMBINDEX, TROPONINI in the last 168 hours. BNP: Invalid input(s): POCBNP CBG: Recent Labs  Lab 08/18/19 1116 08/18/19 1604 08/18/19 2020 08/19/19 0607 08/19/19 1238  GLUCAP 202* 122* 126* 123* 167*   D-Dimer No results for input(s): DDIMER in the last 72 hours. Hgb A1c No results for input(s): HGBA1C in the last 72 hours. Lipid Profile No results for input(s): CHOL, HDL, LDLCALC, TRIG, CHOLHDL, LDLDIRECT in the last 72 hours. Thyroid function studies No results for input(s): TSH, T4TOTAL, T3FREE, THYROIDAB in the last 72 hours.  Invalid input(s): FREET3 Anemia work up No results for input(s): VITAMINB12, FOLATE, FERRITIN, TIBC, IRON, RETICCTPCT in the last 72 hours. Urinalysis    Component Value Date/Time   COLORURINE YELLOW 08/17/2019 2020   APPEARANCEUR HAZY (A) 08/17/2019 2020   LABSPEC 1.015 08/17/2019 2020   PHURINE 5.0 08/17/2019 2020   GLUCOSEU NEGATIVE 08/17/2019 2020   HGBUR LARGE (A) 08/17/2019 2020   BILIRUBINUR NEGATIVE 08/17/2019 2020    [place] 08/17/2019 2020   PROTEINUR 30 (A) 08/17/2019 2020   UROBILINOGEN 0.2 08/29/2012 1129   NITRITE NEGATIVE 08/17/2019 2020   LEUKOCYTESUR MODERATE (A) 08/17/2019 2020   Sepsis Labs Invalid input(s): PROCALCITONIN,  WBC,  LACTICIDVEN Microbiology Recent Results (from the past 240 hour(s))  SARS CORONAVIRUS 2 (TAT 6-24 HRS) Nasopharyngeal Nasopharyngeal Swab     Status: None   Collection Time: 08/12/19  6:55 PM   Specimen: Nasopharyngeal Swab  Result Value Ref Range Status   SARS Coronavirus 2 NEGATIVE NEGATIVE Final    Comment: (NOTE) SARS-CoV-2 target nucleic acids are NOT DETECTED. The SARS-CoV-2 RNA is generally detectable in upper and lower respiratory specimens during  the acute phase of infection. Negative results do not preclude SARS-CoV-2 infection, do not rule out co-infections with other pathogens, and should not be used as the sole basis for treatment or other patient management decisions. Negative results must be combined with clinical observations, patient history, and epidemiological information. The expected result is Negative. Fact Sheet for Patients: SugarRoll.be Fact Sheet for Healthcare Providers: https://www.woods-mathews.com/ This test is not yet approved or cleared by the [place] FDA and  has been authorized for detection and/or diagnosis of SARS-CoV-2 by FDA under an Emergency Use Authorization (EUA). This EUA will remain  in effect (meaning this test can be used) for the duration of the COVID-19 declaration under Section 56 4(b)(1) of the Act, 21 U.S.C. section 360bbb-3(b)(1), unless the authorization is terminated or revoked sooner. Performed at [facility], [place] [address] [place], [place] [address]   SARS CORONAVIRUS 2 (TAT 6-24 HRS) Nasopharyngeal Nasopharyngeal Swab     Status: None   Collection Time: 08/17/19  2:41 PM   Specimen: Nasopharyngeal Swab  Result Value Ref Range  Status   SARS Coronavirus 2 NEGATIVE NEGATIVE Final    Comment: (NOTE) SARS-CoV-2 target nucleic acids are NOT DETECTED. The SARS-CoV-2 RNA is generally detectable in upper and lower respiratory specimens during the acute phase of infection. Negative results do not preclude SARS-CoV-2 infection, do not rule out co-infections with other pathogens, and should not be used as the sole basis for treatment or other patient management decisions. Negative results must be combined with clinical observations, patient history, and epidemiological information. The expected result is Negative. Fact Sheet for Patients: SugarRoll.be Fact Sheet for Healthcare Providers: https://www.woods-mathews.com/ This test is not yet approved or cleared by the [place] FDA and  has been authorized for detection and/or diagnosis of SARS-CoV-2 by FDA under an Emergency Use Authorization (EUA). This EUA will remain  in effect (meaning this test can be used) for the duration of the COVID-19 declaration under Section 56 4(b)(1) of the Act, 21 U.S.C. section 360bbb-3(b)(1), unless the authorization is terminated or revoked sooner. Performed at [facility], [place] [address], [place], [place] [address]      Time coordinating discharge: Over 30 minutes  SIGNED:   Hosie Poisson, MD  [facility] 08/19/2019, 2:01 PM Pager   If 7PM-7AM, please contact night-coverage [email or web address] Password [email or web address]

## 2019-08-19 NOTE — Progress Notes (Signed)
Physical Therapy Treatment Patient Details Name: [name] MRN: [id number] DOB: 1925-07-18 Today's Date: 08/19/2019    History of Present Illness 86 yo male s/p fall with dizziness MRI fairly large cerebellar infarct. 4/3 fall striking head PMH afib on eliquis, AAA, CAD, DM, Diverticulosis, Fall 2018 with facial trauma, HTN,     PT Comments    Patient seen for mobility progression. Pt tolerated short gait distance in room this session with min-mod A. Pt denied dizziness while mobilizing. Continue to progress as tolerated with anticipated d/c to SNF for further skilled PT services.    BP in supine 121/47 (69) BP in sitting 120/57 (77) BP in standing 124/73 (88) BP after ambulating 130/58 (77)     Follow Up Recommendations  SNF;Supervision for mobility/OOB     Equipment Recommendations  Wheelchair cushion (measurements PT);Rolling walker with 5" wheels;3in1 (PT);Wheelchair (measurements PT)    Recommendations for Other Services       Precautions / Restrictions Precautions Precautions: Fall Precaution Comments: orthostatic bp Restrictions Weight Bearing Restrictions: No    Mobility  Bed Mobility Overal bed mobility: Needs Assistance Bed Mobility: Supine to Sit     Supine to sit: Mod assist     General bed mobility comments: cues for sequencing and use of rail; assist to elevate trunk into sitting and to scoot hips to EOB  Transfers Overall transfer level: Needs assistance Equipment used: Rolling walker (2 wheeled) Transfers: Sit to/from Stand Sit to Stand: Min assist         General transfer comment: pt stood from EOB and BSC with min A to power up into standing and to steady upon standing   Ambulation/Gait Ambulation/Gait assistance: Mod assist;+2 safety/equipment;Min assist(chair follow) Gait Distance (Feet): 12 Feet Assistive device: Rolling walker (2 wheeled);2 person hand held assist Gait Pattern/deviations: Step-through pattern;Decreased stride  length;Trunk flexed;Narrow base of support Gait velocity: decreased   General Gait Details: assist for balance and guiding RW; pt denies dizziness while ambulating but fatigued after short distance   Stairs             Wheelchair Mobility    Modified Rankin (Stroke Patients Only) Modified Rankin (Stroke Patients Only) Pre-Morbid Rankin Score: Moderate disability Modified Rankin: Moderately severe disability     Balance Overall balance assessment: Needs assistance Sitting-balance support: Feet supported;Bilateral upper extremity supported Sitting balance-Leahy Scale: Fair     Standing balance support: Bilateral upper extremity supported;During functional activity Standing balance-Leahy Scale: Poor                              Cognition Arousal/Alertness: Awake/alert Behavior During Therapy: Flat affect Overall Cognitive Status: History of cognitive impairments - at baseline                                 General Comments: pt following single step cues consistently; very pleasant and cooperative       Exercises      General Comments        Pertinent Vitals/Pain Pain Assessment: No/denies pain    Home Living                      Prior Function            PT Goals (current goals can now be found in the care plan section) Progress towards PT goals: Progressing toward goals  Frequency    Min 4X/week      PT Plan Current plan remains appropriate    Co-evaluation              AM-PAC PT "6 Clicks" Mobility   Outcome Measure  Help needed turning from your back to your side while in a flat bed without using bedrails?: A Lot Help needed moving from lying on your back to sitting on the side of a flat bed without using bedrails?: A Lot Help needed moving to and from a bed to a chair (including a wheelchair)?: A Little Help needed standing up from a chair using your arms (e.g., wheelchair or bedside chair)?: A  Little Help needed to walk in hospital room?: A Lot Help needed climbing 3-5 steps with a railing? : Total 6 Click Score: 13    End of Session Equipment Utilized During Treatment: Gait belt Activity Tolerance: Patient tolerated treatment well Patient left: with call bell/phone within reach;in chair;with nursing/sitter in room Nurse Communication: Mobility status PT Visit Diagnosis: Unsteadiness on feet (R26.81);Difficulty in walking, not elsewhere classified (R26.2)     Time: [id number] PT Time Calculation (min) (ACUTE ONLY): 36 min  Charges:  $Gait Training: 8-22 mins $Therapeutic Activity: 8-22 mins                     Earney Navy, PTA Acute Rehabilitation Services Pager: [phone] Office: [phone]     Darliss Cheney 08/19/2019, 9:41 AM

## 2019-08-19 NOTE — TOC Transition Note (Signed)
Transition of Care [facility]) - CM/SW Discharge Note   Patient Details  Name: [name] MRN: [id number] Date of Birth: 1925/07/21  Transition of Care [facility]) CM/SW Contact:  Geralynn Ochs, LCSW Phone Number: 08/19/2019, 1:44 PM   Clinical Narrative:   Nurse to call report to [phone], Room 801P.    Final next level of care: Skilled Nursing Facility Barriers to Discharge: Barriers Resolved   Patient Goals and CMS Choice Patient states their goals for this hospitalization and ongoing recovery are:: to be able to return home CMS Medicare.gov Compare Post Acute Care list provided to:: Patient Represenative (must comment) Choice offered to / list presented to : Adult Children  Discharge Placement              [facility] at[facility] Patient to be transferred to facility by: [place] Name of family member notified: Daughters Patient and family notified of of transfer: 08/19/19  Discharge Plan and Services     Post Acute Care Choice: [facility]                               Social Determinants of Health (SDOH) Interventions     Readmission Risk Interventions No flowsheet data found.

## 2019-08-21 LAB — URINE CULTURE: Culture: >=100000 — AB

## 2019-08-23 DIAGNOSIS — I639 Cerebral infarction, unspecified: Secondary | ICD-10-CM | POA: Diagnosis not present

## 2019-08-23 DIAGNOSIS — E039 Hypothyroidism, unspecified: Secondary | ICD-10-CM | POA: Diagnosis not present

## 2019-08-23 DIAGNOSIS — I4891 Unspecified atrial fibrillation: Secondary | ICD-10-CM | POA: Diagnosis not present

## 2019-08-23 DIAGNOSIS — E119 Type 2 diabetes mellitus without complications: Secondary | ICD-10-CM | POA: Diagnosis not present

## 2019-08-24 DIAGNOSIS — N390 Urinary tract infection, site not specified: Secondary | ICD-10-CM | POA: Diagnosis not present

## 2019-08-24 DIAGNOSIS — I639 Cerebral infarction, unspecified: Secondary | ICD-10-CM | POA: Diagnosis not present

## 2019-08-24 DIAGNOSIS — R296 Repeated falls: Secondary | ICD-10-CM | POA: Diagnosis not present

## 2019-08-24 DIAGNOSIS — I951 Orthostatic hypotension: Secondary | ICD-10-CM | POA: Diagnosis not present

## 2019-08-31 DIAGNOSIS — I639 Cerebral infarction, unspecified: Secondary | ICD-10-CM | POA: Diagnosis not present

## 2019-08-31 DIAGNOSIS — E119 Type 2 diabetes mellitus without complications: Secondary | ICD-10-CM | POA: Diagnosis not present

## 2019-08-31 DIAGNOSIS — I10 Essential (primary) hypertension: Secondary | ICD-10-CM | POA: Diagnosis not present

## 2019-08-31 DIAGNOSIS — R296 Repeated falls: Secondary | ICD-10-CM | POA: Diagnosis not present

## 2019-09-08 ENCOUNTER — Telehealth: Payer: Self-pay | Admitting: Internal Medicine

## 2019-09-08 NOTE — Progress Notes (Signed)
°  Chronic Care Management   Outreach Note  09/08/2019 Name: DUBOIS EICHMANN MRN: [id number] DOB: [birth year]  Referred by: Colon Branch, MD Reason for referral : No chief complaint on file.   An unsuccessful telephone outreach was attempted today. The patient was referred to the pharmacist for assistance with care management and care coordination.    This note is not being shared with the patient for the following reason: To respect privacy (The patient or proxy has requested that the information not be shared).  Follow Up Plan:    Raynicia Dukes [employer] Scheduler

## 2019-09-09 DIAGNOSIS — I639 Cerebral infarction, unspecified: Secondary | ICD-10-CM | POA: Diagnosis not present

## 2019-09-09 DIAGNOSIS — E119 Type 2 diabetes mellitus without complications: Secondary | ICD-10-CM | POA: Diagnosis not present

## 2019-09-09 DIAGNOSIS — E785 Hyperlipidemia, unspecified: Secondary | ICD-10-CM | POA: Diagnosis not present

## 2019-09-09 DIAGNOSIS — I10 Essential (primary) hypertension: Secondary | ICD-10-CM | POA: Diagnosis not present

## 2019-09-14 DIAGNOSIS — F329 Major depressive disorder, single episode, unspecified: Secondary | ICD-10-CM | POA: Diagnosis not present

## 2019-09-14 DIAGNOSIS — I10 Essential (primary) hypertension: Secondary | ICD-10-CM | POA: Diagnosis not present

## 2019-09-14 DIAGNOSIS — E039 Hypothyroidism, unspecified: Secondary | ICD-10-CM | POA: Diagnosis not present

## 2019-09-14 DIAGNOSIS — E119 Type 2 diabetes mellitus without complications: Secondary | ICD-10-CM | POA: Diagnosis not present

## 2019-09-17 DIAGNOSIS — Z20828 Contact with and (suspected) exposure to other viral communicable diseases: Secondary | ICD-10-CM | POA: Diagnosis not present

## 2019-09-20 DIAGNOSIS — Z20828 Contact with and (suspected) exposure to other viral communicable diseases: Secondary | ICD-10-CM | POA: Diagnosis not present

## 2019-09-22 ENCOUNTER — Telehealth: Payer: Self-pay

## 2019-09-22 NOTE — Telephone Encounter (Signed)
Spoke with patient to remind of missed remote transmission 

## 2019-09-23 DIAGNOSIS — Z20828 Contact with and (suspected) exposure to other viral communicable diseases: Secondary | ICD-10-CM | POA: Diagnosis not present

## 2019-09-24 DIAGNOSIS — I4891 Unspecified atrial fibrillation: Secondary | ICD-10-CM | POA: Diagnosis not present

## 2019-09-24 DIAGNOSIS — E119 Type 2 diabetes mellitus without complications: Secondary | ICD-10-CM | POA: Diagnosis not present

## 2019-09-24 DIAGNOSIS — E785 Hyperlipidemia, unspecified: Secondary | ICD-10-CM | POA: Diagnosis not present

## 2019-09-24 DIAGNOSIS — I10 Essential (primary) hypertension: Secondary | ICD-10-CM | POA: Diagnosis not present

## 2019-10-01 DIAGNOSIS — I639 Cerebral infarction, unspecified: Secondary | ICD-10-CM | POA: Diagnosis not present

## 2019-10-01 DIAGNOSIS — E119 Type 2 diabetes mellitus without complications: Secondary | ICD-10-CM | POA: Diagnosis not present

## 2019-10-01 DIAGNOSIS — I4891 Unspecified atrial fibrillation: Secondary | ICD-10-CM | POA: Diagnosis not present

## 2019-10-19 ENCOUNTER — Telehealth: Payer: Self-pay | Admitting: Internal Medicine

## 2019-10-19 NOTE — Progress Notes (Signed)
°  Chronic Care Management   Outreach Note  10/19/2019 Name: [name] MRN: [id number] DOB: [birth year]  Referred by: Colon Branch, MD Reason for referral : No chief complaint on file.   An unsuccessful telephone outreach was attempted today. The patient was referred to the pharmacist for assistance with care management and care coordination.   This note is not being shared with the patient for the following reason: To respect privacy (The patient or proxy has requested that the information not be shared).  Follow Up Plan:   Earney Hamburg [employer] Scheduler

## 2019-10-20 DIAGNOSIS — E119 Type 2 diabetes mellitus without complications: Secondary | ICD-10-CM | POA: Diagnosis not present

## 2019-10-20 DIAGNOSIS — I639 Cerebral infarction, unspecified: Secondary | ICD-10-CM | POA: Diagnosis not present

## 2019-10-20 DIAGNOSIS — I10 Essential (primary) hypertension: Secondary | ICD-10-CM | POA: Diagnosis not present

## 2019-10-20 DIAGNOSIS — E785 Hyperlipidemia, unspecified: Secondary | ICD-10-CM | POA: Diagnosis not present

## 2019-10-27 DIAGNOSIS — Z961 Presence of intraocular lens: Secondary | ICD-10-CM | POA: Diagnosis not present

## 2019-10-27 DIAGNOSIS — H401134 Primary open-angle glaucoma, bilateral, indeterminate stage: Secondary | ICD-10-CM | POA: Diagnosis not present

## 2019-10-27 DIAGNOSIS — H34832 Tributary (branch) retinal vein occlusion, left eye, with macular edema: Secondary | ICD-10-CM | POA: Diagnosis not present

## 2019-10-27 DIAGNOSIS — E119 Type 2 diabetes mellitus without complications: Secondary | ICD-10-CM | POA: Diagnosis not present

## 2019-10-29 DIAGNOSIS — F0390 Unspecified dementia without behavioral disturbance: Secondary | ICD-10-CM | POA: Diagnosis not present

## 2019-10-29 DIAGNOSIS — I4891 Unspecified atrial fibrillation: Secondary | ICD-10-CM | POA: Diagnosis not present

## 2019-10-29 DIAGNOSIS — E119 Type 2 diabetes mellitus without complications: Secondary | ICD-10-CM | POA: Diagnosis not present

## 2019-10-29 DIAGNOSIS — I639 Cerebral infarction, unspecified: Secondary | ICD-10-CM | POA: Diagnosis not present

## 2019-11-09 ENCOUNTER — Telehealth: Payer: Self-pay | Admitting: Internal Medicine

## 2019-11-09 NOTE — Progress Notes (Signed)
  Chronic Care Management   Outreach Note  11/09/2019 Name: [name] MRN: [id number] DOB: Oct 05, 1925  Referred by: Colon Branch, MD Reason for referral : No chief complaint on file.   An unsuccessful telephone outreach was attempted today. The patient was referred to the pharmacist for assistance with care management and care coordination. This note is not being shared with the patient for the following reason: To respect privacy (The patient or proxy has requested that the information not be shared).  Follow Up Plan:   Earney [place] [employer] Scheduler

## 2019-11-10 DIAGNOSIS — I63441 Cerebral infarction due to embolism of right cerebellar artery: Secondary | ICD-10-CM | POA: Diagnosis not present

## 2019-11-10 DIAGNOSIS — R296 Repeated falls: Secondary | ICD-10-CM | POA: Diagnosis not present

## 2019-11-10 DIAGNOSIS — I639 Cerebral infarction, unspecified: Secondary | ICD-10-CM | POA: Diagnosis not present

## 2019-11-10 DIAGNOSIS — R2681 Unsteadiness on feet: Secondary | ICD-10-CM | POA: Diagnosis not present

## 2019-11-11 DIAGNOSIS — R2681 Unsteadiness on feet: Secondary | ICD-10-CM | POA: Diagnosis not present

## 2019-11-11 DIAGNOSIS — I63441 Cerebral infarction due to embolism of right cerebellar artery: Secondary | ICD-10-CM | POA: Diagnosis not present

## 2019-11-11 DIAGNOSIS — R296 Repeated falls: Secondary | ICD-10-CM | POA: Diagnosis not present

## 2019-11-11 DIAGNOSIS — I639 Cerebral infarction, unspecified: Secondary | ICD-10-CM | POA: Diagnosis not present

## 2019-11-12 DIAGNOSIS — R2681 Unsteadiness on feet: Secondary | ICD-10-CM | POA: Diagnosis not present

## 2019-11-12 DIAGNOSIS — I639 Cerebral infarction, unspecified: Secondary | ICD-10-CM | POA: Diagnosis not present

## 2019-11-12 DIAGNOSIS — I63441 Cerebral infarction due to embolism of right cerebellar artery: Secondary | ICD-10-CM | POA: Diagnosis not present

## 2019-11-12 DIAGNOSIS — R296 Repeated falls: Secondary | ICD-10-CM | POA: Diagnosis not present

## 2019-11-13 DIAGNOSIS — I63441 Cerebral infarction due to embolism of right cerebellar artery: Secondary | ICD-10-CM | POA: Diagnosis not present

## 2019-11-13 DIAGNOSIS — R2681 Unsteadiness on feet: Secondary | ICD-10-CM | POA: Diagnosis not present

## 2019-11-13 DIAGNOSIS — I639 Cerebral infarction, unspecified: Secondary | ICD-10-CM | POA: Diagnosis not present

## 2019-11-13 DIAGNOSIS — R296 Repeated falls: Secondary | ICD-10-CM | POA: Diagnosis not present

## 2019-11-16 DIAGNOSIS — I63441 Cerebral infarction due to embolism of right cerebellar artery: Secondary | ICD-10-CM | POA: Diagnosis not present

## 2019-11-16 DIAGNOSIS — I639 Cerebral infarction, unspecified: Secondary | ICD-10-CM | POA: Diagnosis not present

## 2019-11-16 DIAGNOSIS — R2681 Unsteadiness on feet: Secondary | ICD-10-CM | POA: Diagnosis not present

## 2019-11-16 DIAGNOSIS — R296 Repeated falls: Secondary | ICD-10-CM | POA: Diagnosis not present

## 2019-11-17 DIAGNOSIS — R296 Repeated falls: Secondary | ICD-10-CM | POA: Diagnosis not present

## 2019-11-17 DIAGNOSIS — I639 Cerebral infarction, unspecified: Secondary | ICD-10-CM | POA: Diagnosis not present

## 2019-11-17 DIAGNOSIS — I63441 Cerebral infarction due to embolism of right cerebellar artery: Secondary | ICD-10-CM | POA: Diagnosis not present

## 2019-11-17 DIAGNOSIS — R2681 Unsteadiness on feet: Secondary | ICD-10-CM | POA: Diagnosis not present

## 2019-11-18 DIAGNOSIS — I639 Cerebral infarction, unspecified: Secondary | ICD-10-CM | POA: Diagnosis not present

## 2019-11-18 DIAGNOSIS — I63441 Cerebral infarction due to embolism of right cerebellar artery: Secondary | ICD-10-CM | POA: Diagnosis not present

## 2019-11-18 DIAGNOSIS — R2681 Unsteadiness on feet: Secondary | ICD-10-CM | POA: Diagnosis not present

## 2019-11-18 DIAGNOSIS — R296 Repeated falls: Secondary | ICD-10-CM | POA: Diagnosis not present

## 2019-11-19 DIAGNOSIS — I639 Cerebral infarction, unspecified: Secondary | ICD-10-CM | POA: Diagnosis not present

## 2019-11-19 DIAGNOSIS — R296 Repeated falls: Secondary | ICD-10-CM | POA: Diagnosis not present

## 2019-11-19 DIAGNOSIS — R2681 Unsteadiness on feet: Secondary | ICD-10-CM | POA: Diagnosis not present

## 2019-11-19 DIAGNOSIS — I63441 Cerebral infarction due to embolism of right cerebellar artery: Secondary | ICD-10-CM | POA: Diagnosis not present

## 2019-11-21 DIAGNOSIS — R2681 Unsteadiness on feet: Secondary | ICD-10-CM | POA: Diagnosis not present

## 2019-11-21 DIAGNOSIS — R296 Repeated falls: Secondary | ICD-10-CM | POA: Diagnosis not present

## 2019-11-21 DIAGNOSIS — I63441 Cerebral infarction due to embolism of right cerebellar artery: Secondary | ICD-10-CM | POA: Diagnosis not present

## 2019-11-21 DIAGNOSIS — I639 Cerebral infarction, unspecified: Secondary | ICD-10-CM | POA: Diagnosis not present

## 2019-11-22 DIAGNOSIS — I639 Cerebral infarction, unspecified: Secondary | ICD-10-CM | POA: Diagnosis not present

## 2019-11-22 DIAGNOSIS — I63441 Cerebral infarction due to embolism of right cerebellar artery: Secondary | ICD-10-CM | POA: Diagnosis not present

## 2019-11-22 DIAGNOSIS — R296 Repeated falls: Secondary | ICD-10-CM | POA: Diagnosis not present

## 2019-11-22 DIAGNOSIS — R2681 Unsteadiness on feet: Secondary | ICD-10-CM | POA: Diagnosis not present

## 2019-11-25 DIAGNOSIS — I639 Cerebral infarction, unspecified: Secondary | ICD-10-CM | POA: Diagnosis not present

## 2019-11-25 DIAGNOSIS — R2681 Unsteadiness on feet: Secondary | ICD-10-CM | POA: Diagnosis not present

## 2019-11-25 DIAGNOSIS — R296 Repeated falls: Secondary | ICD-10-CM | POA: Diagnosis not present

## 2019-11-25 DIAGNOSIS — I63441 Cerebral infarction due to embolism of right cerebellar artery: Secondary | ICD-10-CM | POA: Diagnosis not present

## 2019-11-27 DIAGNOSIS — I639 Cerebral infarction, unspecified: Secondary | ICD-10-CM | POA: Diagnosis not present

## 2019-11-27 DIAGNOSIS — R296 Repeated falls: Secondary | ICD-10-CM | POA: Diagnosis not present

## 2019-11-27 DIAGNOSIS — R2681 Unsteadiness on feet: Secondary | ICD-10-CM | POA: Diagnosis not present

## 2019-11-27 DIAGNOSIS — I63441 Cerebral infarction due to embolism of right cerebellar artery: Secondary | ICD-10-CM | POA: Diagnosis not present

## 2019-11-29 DIAGNOSIS — R296 Repeated falls: Secondary | ICD-10-CM | POA: Diagnosis not present

## 2019-11-29 DIAGNOSIS — I639 Cerebral infarction, unspecified: Secondary | ICD-10-CM | POA: Diagnosis not present

## 2019-11-29 DIAGNOSIS — I63441 Cerebral infarction due to embolism of right cerebellar artery: Secondary | ICD-10-CM | POA: Diagnosis not present

## 2019-11-29 DIAGNOSIS — R2681 Unsteadiness on feet: Secondary | ICD-10-CM | POA: Diagnosis not present

## 2019-11-30 DIAGNOSIS — I639 Cerebral infarction, unspecified: Secondary | ICD-10-CM | POA: Diagnosis not present

## 2019-11-30 DIAGNOSIS — R2681 Unsteadiness on feet: Secondary | ICD-10-CM | POA: Diagnosis not present

## 2019-11-30 DIAGNOSIS — R296 Repeated falls: Secondary | ICD-10-CM | POA: Diagnosis not present

## 2019-11-30 DIAGNOSIS — I63441 Cerebral infarction due to embolism of right cerebellar artery: Secondary | ICD-10-CM | POA: Diagnosis not present

## 2019-12-02 DIAGNOSIS — R296 Repeated falls: Secondary | ICD-10-CM | POA: Diagnosis not present

## 2019-12-02 DIAGNOSIS — I639 Cerebral infarction, unspecified: Secondary | ICD-10-CM | POA: Diagnosis not present

## 2019-12-02 DIAGNOSIS — R2681 Unsteadiness on feet: Secondary | ICD-10-CM | POA: Diagnosis not present

## 2019-12-02 DIAGNOSIS — I63441 Cerebral infarction due to embolism of right cerebellar artery: Secondary | ICD-10-CM | POA: Diagnosis not present

## 2019-12-03 DIAGNOSIS — R296 Repeated falls: Secondary | ICD-10-CM | POA: Diagnosis not present

## 2019-12-03 DIAGNOSIS — I639 Cerebral infarction, unspecified: Secondary | ICD-10-CM | POA: Diagnosis not present

## 2019-12-03 DIAGNOSIS — I63441 Cerebral infarction due to embolism of right cerebellar artery: Secondary | ICD-10-CM | POA: Diagnosis not present

## 2019-12-03 DIAGNOSIS — R2681 Unsteadiness on feet: Secondary | ICD-10-CM | POA: Diagnosis not present

## 2019-12-15 ENCOUNTER — Telehealth: Payer: Self-pay

## 2019-12-15 NOTE — Telephone Encounter (Signed)
Carelink alert received- RRT/ERI triggered 10/08/19. VVI/65bpm with Vpaced 84%. Appears to have missed some monthly remote FU transmissions.   Last inclinic check 06/03/19 with Joseph Art, PA.  Report in media tab shows underlying rhythm VS at 52bpm.  Device was previously programmed VVIR 60.   Spoke with pt daughter, [name] (DPR on file).  She reports pt in nursing facility due to stroke.  She has his remote monitor at her home, she brought it over to the SNF this past weekend which is why we have not received reports until now.    Advised of device status, pt needs to be seen by Dr. Lovena Le ASAP due to battery life.

## 2019-12-17 ENCOUNTER — Other Ambulatory Visit: Payer: Self-pay

## 2019-12-17 ENCOUNTER — Inpatient Hospital Stay (HOSPITAL_COMMUNITY): Admission: RE | Admit: 2019-12-17 | Payer: Medicare HMO | Source: Ambulatory Visit

## 2019-12-17 ENCOUNTER — Ambulatory Visit (INDEPENDENT_AMBULATORY_CARE_PROVIDER_SITE_OTHER): Payer: Medicare HMO | Admitting: Internal Medicine

## 2019-12-17 ENCOUNTER — Encounter: Payer: Self-pay | Admitting: Internal Medicine

## 2019-12-17 VITALS — BP 104/58 | HR 65 | Ht 66.0 in | Wt 145.2 lb

## 2019-12-17 DIAGNOSIS — I4821 Permanent atrial fibrillation: Secondary | ICD-10-CM | POA: Diagnosis not present

## 2019-12-17 DIAGNOSIS — I442 Atrioventricular block, complete: Secondary | ICD-10-CM | POA: Diagnosis not present

## 2019-12-17 DIAGNOSIS — Z950 Presence of cardiac pacemaker: Secondary | ICD-10-CM | POA: Diagnosis not present

## 2019-12-17 NOTE — Progress Notes (Signed)
    HPI [name] returns today for followup. He is an 86-year-old man with a history of symptomatic bradycardia, status post permanent pacemaker insertion over 32 years ago. He is status post his second replacement approximately9yearsago. In the interim, no chest pain, shortness of breath, or syncope. No peripheral edema. He denies fevers or chills, night sweats, or recurrent nausea or vomiting.He reached ERI on his PPM 3 months ago. Allergies  Allergen Reactions  . Triamterene   . Ace Inhibitors Cough  . Codeine Phosphate Nausea Only  . Hydrochlorothiazide W-Triamterene Other (See Comments)    CRAMPING     Current Outpatient Medications  Medication Sig Dispense Refill  . apixaban (ELIQUIS) 5 MG TABS tablet Take 1 tablet (5 mg total) by mouth 2 (two) times daily. 180 tablet 1  . azelastine (ASTELIN) 0.1 % nasal spray Place 2 sprays into both nostrils at bedtime as needed for rhinitis or allergies. Use in each nostril as directed 30 mL 12  . Blood Glucose Monitoring Suppl (ACCU-CHEK GUIDE) w/Device KIT 1 Device by Does not apply route daily. 1 kit 0  . digoxin (LANOXIN) 0.125 MG tablet TAKE 1 TABLET BY MOUTH EVERY OTHER DAY 45 tablet 6  . dorzolamide-timolol (COSOPT) 22.3-6.8 MG/ML ophthalmic solution Place 1 drop into both eyes 2 (two) times daily. 10 mL 12  . escitalopram (LEXAPRO) 10 MG tablet Take 1 tablet (10 mg total) by mouth daily. 90 tablet 1  . fenofibrate micronized (LOFIBRA) 134 MG capsule Take 1 capsule (134 mg total) by mouth daily. 90 capsule 1  . glucose blood (ACCU-CHEK GUIDE) test strip Used to check blood sugars 2x daily. 100 each 12  . insulin aspart (NOVOLOG) 100 UNIT/ML injection CBG 70 - 120: 0 units CBG 121 - 150: 1 unit CBG 151 - 200: 2 units CBG 201 - 250: 3 units CBG 251 - 300: 5 units CBG 301 - 350: 7 units CBG 351 - 400: 9 units 10 mL 11  . Insulin Pen Needle 31G X 6 MM MISC To use w/ Lantus 100 each 12  . levothyroxine (SYNTHROID) 100 MCG tablet  Take 1 tablet (100 mcg total) by mouth daily before breakfast. 90 tablet 1  . loratadine (CLARITIN) 10 MG tablet Take 10 mg by mouth daily.    . melatonin 3 MG TABS tablet Take 6 mg by mouth at bedtime.    . metoprolol tartrate (LOPRESSOR) 25 MG tablet Take 0.5 tablets (12.5 mg total) by mouth 2 (two) times daily. 60 tablet 0  . mirtazapine (REMERON) 15 MG tablet Take 1 tablet (15 mg total) by mouth at bedtime. 30 tablet 0  . nitroGLYCERIN (NITROSTAT) 0.4 MG SL tablet Place 1 tablet (0.4 mg total) under the tongue every 5 (five) minutes x 3 doses as needed for chest pain. Then contact 911 or go to ER 25 tablet 3  . pantoprazole (PROTONIX) 40 MG tablet Take 1 tablet (40 mg total) by mouth daily before breakfast. 90 tablet 3  . pravastatin (PRAVACHOL) 40 MG tablet Take 2 tablets (80 mg total) by mouth daily. 180 tablet 1  . senna-docusate (SENOKOT-S) 8.6-50 MG tablet Take 1 tablet by mouth at bedtime as needed for moderate constipation.    . sitaGLIPtin (JANUVIA) 100 MG tablet Take 1 tablet (100 mg total) by mouth daily. 90 tablet 1   No current facility-administered medications for this visit.     Past Medical History:  Diagnosis Date  . AAA (abdominal aortic aneurysm) (HCC)   .   Anemia due to chronic blood loss 11/25/2008   Recurrent over the years EGD and colonoscopy x 2 each 2004 and 2010 without cause    . Atrial fibrillation (HCC)   . BPH (benign prostatic hyperplasia)    reports aprocedure (TURP) remotely in [facility]..No futher f/u w/ urology  . CAD (coronary artery disease)   . Diabetes mellitus, type 2 (HCC)   . Diverticulosis    left colon  . Erosive gastritis   . Fall 08/08/2016   OUTSIDE AT HOME FACIAL TRAUMA   . GERD (gastroesophageal reflux disease)   . Hyperlipidemia   . Hypertension   . Hypothyroidism   . Insomnia    transient  . Osteopenia    dexa 2-11  . Vitamin B12 deficiency     ROS:   All systems reviewed and negative except as noted in the HPI.   Past  Surgical History:  Procedure Laterality Date  . CARDIAC CATHETERIZATION  01/31/06, 09/25/10, 09-2011  . CATARACT EXTRACTION     right  . CHOLECYSTECTOMY, LAPAROSCOPIC    . COLONOSCOPY     multiple  . CORONARY ARTERY BYPASS GRAFT     1999 stents in 2000  . ESOPHAGOGASTRODUODENOSCOPY     multiple  . GIVENS CAPSULE STUDY N/A 11/04/2012   Procedure: GIVENS CAPSULE STUDY;  Surgeon: Cailean E Gessner, MD;  Location: [facility] ENDOSCOPY;  Service: Endoscopy;  Laterality: N/A;  . inguinal herniorrhaphies     bilateral  . LEFT HEART CATHETERIZATION WITH CORONARY ANGIOGRAM N/A 10/04/2011   Procedure: LEFT HEART CATHETERIZATION WITH CORONARY ANGIOGRAM;  Surgeon: Christopher D McAlhany, MD;  Location: [facility] CATH LAB;  Service: Cardiovascular;  Laterality: N/A;  . pacemaker  1980, 1995, 2013   medtronic minix 8341  . PENILE PROSTHESIS IMPLANT  1992  . PERMANENT PACEMAKER GENERATOR CHANGE N/A 06/05/2011   Procedure: PERMANENT PACEMAKER GENERATOR CHANGE;  Surgeon: Khyri Hinzman W Zenna Traister, MD;  Location: [facility] CATH LAB;  Service: Cardiovascular;  Laterality: N/A;  . PROSTATECTOMY     transurethral  . right hip replacement    . TRANSTHORACIC ECHOCARDIOGRAM  12/2006     Family History  Problem Relation Age of Onset  . Alzheimer's disease Mother   . Heart failure Father   . CVA Father   . Coronary artery disease Brother        cabg  . Alzheimer's disease Sister   . Mental illness Sister   . Alzheimer's disease Sister   . Other Sister        lung problems  . Diabetes Other        several siblings  . Prostate cancer Neg Hx   . Colon cancer Neg Hx   . Esophageal cancer Neg Hx   . Stomach cancer Neg Hx   . Rectal cancer Neg Hx      Social History   Socioeconomic History  . Marital status: Legally Separated    Spouse name: Not on file  . Number of children: 5  . Years of education: Not on file  . Highest education level: Not on file  Occupational History  . Occupation: retired    Employer: RETIRED  Tobacco  Use  . Smoking status: Never Smoker  . Smokeless tobacco: Never Used  Substance and Sexual Activity  . Alcohol use: Yes    Alcohol/week: 1.0 - 2.0 standard drink    Types: 1 - 2 Glasses of wine per week    Comment: socially   . Drug use: No  . Sexual activity: Not   Currently  Other Topics Concern  . Not on file  Social History Narrative   Lives by self, independent on ADL, retired. Separated from wife.    Daughters:   Leigh ( lives in [place]) [phone]   Sherri ( lives out of town)  [phone]         Social Determinants of Health   Financial Resource Strain:   . Difficulty of Paying Living Expenses:   Food Insecurity:   . Worried About Running Out of Food in the Last Year:   . Ran Out of Food in the Last Year:   Transportation Needs:   . Lack of Transportation (Medical):   . Lack of Transportation (Non-Medical):   Physical Activity:   . Days of Exercise per Week:   . Minutes of Exercise per Session:   Stress:   . Feeling of Stress :   Social Connections:   . Frequency of Communication with Friends and Family:   . Frequency of Social Gatherings with Friends and Family:   . Attends Religious Services:   . Active Member of Clubs or Organizations:   . Attends Club or Organization Meetings:   . Marital Status:   Intimate Partner Violence:   . Fear of Current or Ex-Partner:   . Emotionally Abused:   . Physically Abused:   . Sexually Abused:      BP (!) 104/58   Pulse 65   Ht 5' 6" (1.676 m)   Wt 145 lb 3.2 oz (65.9 kg)   SpO2 98%   BMI 23.44 kg/m   Physical Exam:  Well appearing NAD HEENT: Unremarkable Neck:  No JVD, no thyromegally Lymphatics:  No adenopathy Back:  No CVA tenderness Lungs:  Clear with no wheezes HEART:  Regular rate rhythm, no murmurs, no rubs, no clicks Abd:  soft, positive bowel sounds, no organomegally, no rebound, no guarding Ext:  2 plus pulses, no edema, no cyanosis, no clubbing Skin:  No rashes no nodules Neuro:  CN II  through XII intact, motor grossly intact  EKG - atrial fib with ventricular pacing  DEVICE  Normal device function.  See PaceArt for details. ERI  Assess/Plan: 1. CHB - he is asymptomatic, s/p PPM insertion. 2. PM - he has reached ERI and he will be scheduled for PPM gen change out. 3. Coags - he will hold his Eliquis for 2 days prior to PM gen change out.  4. HTN - his bp is well controlled. We might consider reducing his dose in the future.  Baylyn Sickles,MD. 

## 2019-12-17 NOTE — H&P (View-Only) (Signed)
    HPI [name] returns today for followup. He is an 86-year-old man with a history of symptomatic bradycardia, status post permanent pacemaker insertion over 32 years ago. He is status post his second replacement approximately9yearsago. In the interim, no chest pain, shortness of breath, or syncope. No peripheral edema. He denies fevers or chills, night sweats, or recurrent nausea or vomiting.He reached ERI on his PPM 3 months ago. Allergies  Allergen Reactions  . Triamterene   . Ace Inhibitors Cough  . Codeine Phosphate Nausea Only  . Hydrochlorothiazide W-Triamterene Other (See Comments)    CRAMPING     Current Outpatient Medications  Medication Sig Dispense Refill  . apixaban (ELIQUIS) 5 MG TABS tablet Take 1 tablet (5 mg total) by mouth 2 (two) times daily. 180 tablet 1  . azelastine (ASTELIN) 0.1 % nasal spray Place 2 sprays into both nostrils at bedtime as needed for rhinitis or allergies. Use in each nostril as directed 30 mL 12  . Blood Glucose Monitoring Suppl (ACCU-CHEK GUIDE) w/Device KIT 1 Device by Does not apply route daily. 1 kit 0  . digoxin (LANOXIN) 0.125 MG tablet TAKE 1 TABLET BY MOUTH EVERY OTHER DAY 45 tablet 6  . dorzolamide-timolol (COSOPT) 22.3-6.8 MG/ML ophthalmic solution Place 1 drop into both eyes 2 (two) times daily. 10 mL 12  . escitalopram (LEXAPRO) 10 MG tablet Take 1 tablet (10 mg total) by mouth daily. 90 tablet 1  . fenofibrate micronized (LOFIBRA) 134 MG capsule Take 1 capsule (134 mg total) by mouth daily. 90 capsule 1  . glucose blood (ACCU-CHEK GUIDE) test strip Used to check blood sugars 2x daily. 100 each 12  . insulin aspart (NOVOLOG) 100 UNIT/ML injection CBG 70 - 120: 0 units CBG 121 - 150: 1 unit CBG 151 - 200: 2 units CBG 201 - 250: 3 units CBG 251 - 300: 5 units CBG 301 - 350: 7 units CBG 351 - 400: 9 units 10 mL 11  . Insulin Pen Needle 31G X 6 MM MISC To use w/ Lantus 100 each 12  . levothyroxine (SYNTHROID) 100 MCG tablet  Take 1 tablet (100 mcg total) by mouth daily before breakfast. 90 tablet 1  . loratadine (CLARITIN) 10 MG tablet Take 10 mg by mouth daily.    . melatonin 3 MG TABS tablet Take 6 mg by mouth at bedtime.    . metoprolol tartrate (LOPRESSOR) 25 MG tablet Take 0.5 tablets (12.5 mg total) by mouth 2 (two) times daily. 60 tablet 0  . mirtazapine (REMERON) 15 MG tablet Take 1 tablet (15 mg total) by mouth at bedtime. 30 tablet 0  . nitroGLYCERIN (NITROSTAT) 0.4 MG SL tablet Place 1 tablet (0.4 mg total) under the tongue every 5 (five) minutes x 3 doses as needed for chest pain. Then contact 911 or go to ER 25 tablet 3  . pantoprazole (PROTONIX) 40 MG tablet Take 1 tablet (40 mg total) by mouth daily before breakfast. 90 tablet 3  . pravastatin (PRAVACHOL) 40 MG tablet Take 2 tablets (80 mg total) by mouth daily. 180 tablet 1  . senna-docusate (SENOKOT-S) 8.6-50 MG tablet Take 1 tablet by mouth at bedtime as needed for moderate constipation.    . sitaGLIPtin (JANUVIA) 100 MG tablet Take 1 tablet (100 mg total) by mouth daily. 90 tablet 1   No current facility-administered medications for this visit.     Past Medical History:  Diagnosis Date  . AAA (abdominal aortic aneurysm) (HCC)   .   Anemia due to chronic blood loss 11/25/2008   Recurrent over the years EGD and colonoscopy x 2 each 2004 and 2010 without cause    . Atrial fibrillation ([place])   . BPH (benign prostatic hyperplasia)    reports aprocedure (TURP) remotely in [place].Marland KitchenNo futher f/u w/ urology  . CAD (coronary artery disease)   . Diabetes mellitus, type 2 ([place])   . Diverticulosis    left colon  . Erosive gastritis   . Fall 08/08/2016   OUTSIDE AT HOME FACIAL TRAUMA   . GERD (gastroesophageal reflux disease)   . Hyperlipidemia   . Hypertension   . Hypothyroidism   . Insomnia    transient  . Osteopenia    dexa 2-11  . Vitamin B12 deficiency     ROS:   All systems reviewed and negative except as noted in the HPI.   Past  Surgical History:  Procedure Laterality Date  . CARDIAC CATHETERIZATION  01/31/06, 09/25/10, 09-2011  . CATARACT EXTRACTION     right  . CHOLECYSTECTOMY, LAPAROSCOPIC    . COLONOSCOPY     multiple  . CORONARY ARTERY BYPASS GRAFT     1999 stents in 2000  . ESOPHAGOGASTRODUODENOSCOPY     multiple  . GIVENS CAPSULE STUDY N/A 11/04/2012   Procedure: GIVENS CAPSULE STUDY;  Surgeon: Gatha Mayer, MD;  Location: [facility] ENDOSCOPY;  Service: Endoscopy;  Laterality: N/A;  . inguinal herniorrhaphies     bilateral  . LEFT HEART CATHETERIZATION WITH CORONARY ANGIOGRAM N/A 10/04/2011   Procedure: LEFT HEART CATHETERIZATION WITH CORONARY ANGIOGRAM;  Surgeon: Burnell Blanks, MD;  Location: [facility] CATH LAB;  Service: Cardiovascular;  Laterality: N/A;  . pacemaker  [place], 2013   medtronic minix [id number]  . PENILE PROSTHESIS IMPLANT  1992  . PERMANENT PACEMAKER GENERATOR CHANGE N/A 06/05/2011   Procedure: PERMANENT PACEMAKER GENERATOR CHANGE;  Surgeon: Evans Lance, MD;  Location: [facility] CATH LAB;  Service: Cardiovascular;  Laterality: N/A;  . PROSTATECTOMY     transurethral  . right hip replacement    . TRANSTHORACIC ECHOCARDIOGRAM  12/2006     Family History  Problem Relation Age of Onset  . Alzheimer's disease Mother   . Heart failure Father   . CVA Father   . Coronary artery disease Brother        cabg  . Alzheimer's disease Sister   . Mental illness Sister   . Alzheimer's disease Sister   . Other Sister        lung problems  . Diabetes Other        several siblings  . Prostate cancer Neg Hx   . Colon cancer Neg Hx   . Esophageal cancer Neg Hx   . Stomach cancer Neg Hx   . Rectal cancer Neg Hx      Social History   Socioeconomic History  . Marital status: Legally Separated    Spouse name: Not on file  . Number of children: 5  . Years of education: Not on file  . Highest education level: Not on file  Occupational History  . Occupation: retired    Fish farm manager: RETIRED  Tobacco  Use  . Smoking status: Never Smoker  . Smokeless tobacco: Never Used  Substance and Sexual Activity  . Alcohol use: Yes    Alcohol/week: 1.0 - 2.0 standard drink    Types: 1 - 2 Glasses of wine per week    Comment: socially   . Drug use: No  . Sexual activity: Not  Currently  Other Topics Concern  . Not on file  Social History Narrative   Lives by self, independent on ADL, retired. Separated from wife.    Daughters:   [name] ( lives in [place]) [phone]   [name] ( lives out of town)  [phone]         Social Determinants of Health   Financial Resource Strain:   . Difficulty of Paying Living Expenses:   Food Insecurity:   . Worried About Charity fundraiser in the Last Year:   . Arboriculturist in the Last Year:   Transportation Needs:   . Film/video editor (Medical):   [name] Lack of Transportation (Non-Medical):   Physical Activity:   . Days of Exercise per Week:   . Minutes of Exercise per Session:   Stress:   . Feeling of Stress :   Social Connections:   . Frequency of Communication with Friends and Family:   . Frequency of Social Gatherings with Friends and Family:   . Attends Religious Services:   . Active Member of Clubs or Organizations:   . Attends Archivist Meetings:   [name] Marital Status:   Intimate Partner Violence:   . Fear of Current or Ex-Partner:   . Emotionally Abused:   [name] Physically Abused:   . Sexually Abused:      BP (!) 104/58   Pulse 65   Ht 5' 6" (1.676 m)   Wt 145 lb 3.2 oz (65.9 kg)   SpO2 98%   BMI 23.44 kg/m   Physical Exam:  Well appearing NAD HEENT: Unremarkable Neck:  No JVD, no thyromegally Lymphatics:  No adenopathy Back:  No CVA tenderness Lungs:  Clear with no wheezes HEART:  Regular rate rhythm, no murmurs, no rubs, no clicks Abd:  soft, positive bowel sounds, no organomegally, no rebound, no guarding Ext:  2 plus pulses, no edema, no cyanosis, no clubbing Skin:  No rashes no nodules Neuro:  CN II  through XII intact, motor grossly intact  EKG - atrial fib with ventricular pacing  DEVICE  Normal device function.  See PaceArt for details. ERI  Assess/Plan: 1. CHB - he is asymptomatic, s/p PPM insertion. 2. PM - he has reached ERI and he will be scheduled for PPM gen change out. 3. Coags - he will hold his Eliquis for 2 days prior to PM gen change out.  4. HTN - his bp is well controlled. We might consider reducing his dose in the future.  Salome Spotted.

## 2019-12-17 NOTE — Patient Instructions (Signed)
Medication Instructions:  Your physician recommends that you continue on your current medications as directed. Please refer to the Current Medication list given to you today.  Labwork: You will get lab work today:  BMP and CBC  Testing/Procedures: None ordered.  Follow-Up:  SEE INSTRUCTION LETTER  Any Other Special Instructions Will Be Listed Below (If Applicable).  If you need a refill on your cardiac medications before your next appointment, please call your pharmacy.    Pacemaker Battery Change  A pacemaker battery usually lasts 5-15 years (6-7 years on average). A few times a year, you will be asked to visit your health care provider to have a full evaluation of your pacemaker. When the battery is low, your pacemaker battery and generator will be completely replaced. Most often, this procedure is simpler than the first surgery because the wires (leads) that connect the generator to the heart are already in place. There are many things that affect how long a pacemaker battery will last, including:  The age of the pacemaker.  The number of leads you have(1, 2, or 3).  The pacemaker workload. If the pacemaker is helping the heart more often, the battery will not last as long.  Power (voltage) settings. Tell a health care provider about:  Any allergies you have.  All medicines you are taking, including vitamins, herbs, eye drops, creams, and over-the-counter medicines.  Any problems you or family members have had with anesthetic medicines.  Any blood disorders you have.  Any surgeries you have had, especially the surgeries you have had since your last pacemaker was placed.  Any medical conditions you have.  Whether you are pregnant or may be pregnant.  Any symptoms of heart problems, such as chest pain, trouble breathing, palpitations, light-headedness, or feelings of an abnormal or irregular heartbeat.  Smoking habits. This can affect your reaction to  anesthesia. What are the risks? Generally, this is a safe procedure. However, problems may occur, including:  Bleeding.  Bruising of the skin around where the surgical cut (incision) was made.  Pulling apart of the skin at the incision site.  Infection.  Nerve damage.  Injury to other organs, such as the lungs.  Allergic reaction to anesthetics or other medicines used during the procedure.  People with diabetes may have a temporary increase in blood sugar (glucose) after any surgical procedure. What happens before the procedure? Staying hydrated Follow instructions from your health care provider about hydration, which may include:  Up to 2 hours before the procedure - you may continue to drink clear liquids, such as water, clear fruit juice, black coffee, and plain tea. Eating and drinking restrictions Follow instructions from your health care provider about eating and drinking restrictions, which may include:  8 hours before the procedure - stop eating heavy meals or foods such as meat, fried foods, or fatty foods.  6 hours before the procedure - stop eating light meals or foods, such as toast or cereal.  6 hours before the procedure - stop drinking milk or drinks that contain milk.  2 hours before the procedure - stop drinking clear liquids. General instructions  Ask your health care provider about: ? Changing or stopping your regular medicines. This is especially important if you are taking diabetes medicines or blood thinners. ? Taking medicines such as aspirin and ibuprofen. These medicines can thin your blood. Do not take these medicines before your procedure if your health care provider instructs you not to. ? Taking a sip of water   with any approved medicines on the morning of the procedure.  Plan to have someone take you home after the procedure. What happens during the procedure?  To reduce your risk of infection: ? Your health care team will wash or sanitize  their hands. ? The skin around the area of the chest will be washed with soap. ? Hair may be removed from the surgical area.  An IV tube will be inserted into one your veins to give you medicine and fluids.  You will be given one or more of the following: ? A medicine to help you relax (sedative). ? A medicine to numb the area where the pacemaker is located (local anesthetic).  You may be given antibiotic medicine to prevent infection.  Your health care provider will make an incision to reopen the pocket holding the pacemaker.  The old pacemaker will be disconnected from the leads.  The leads will be tested.  If needed, the leads will be replaced. If the leads are functioning properly, the new pacemaker will be connected to the existing leads.  A heart monitor and the pacemaker programmer will be used to make sure that the newly implanted pacemaker is working properly.  The incision site will be closed. A bandage (dressing) will be placed over the pacemaker site. The procedure may vary among health care providers and hospitals. What happens after the procedure?  Your blood pressure, heart rate, breathing rate, and blood oxygen level will be monitored until your health care team is satisfied that your pacemaker is working properly.  Your health care provider will tell you when your pacemaker will need to be tested again, or when to return to the office for removal of dressing and stitches.  Do not drive for 24 hours if you were given a sedative.  The dressing will be removed 24-48 hours after the procedure, or as told by your health care provider. Summary  A pacemaker battery usually lasts 5-15 years (6-7 years on average).  When the battery is low, your pacemaker battery and generator will be completely replaced.  Risks of this procedure include bleeding, bruising, infection, damage to other structures, pulling apart of the skin at the incision site, and allergic reactions to  medicines or anesthetics.  Most often, this procedure is simpler than the first surgery because the wires (leads) that connect the generator to the heart are already in place. This information is not intended to replace advice given to you by your health care provider. Make sure you discuss any questions you have with your health care provider. Document Revised: 04/11/2017 Document Reviewed: 04/02/2016 Elsevier Patient Education  2020 [employer]  

## 2019-12-18 LAB — CBC WITH DIFFERENTIAL/PLATELET
Basophils Absolute: 0 10*3/uL (ref 0.0–0.2)
Basos: 1 %
EOS (ABSOLUTE): 0.1 10*3/uL (ref 0.0–0.4)
Eos: 2 %
Hematocrit: 36.6 % — ABNORMAL LOW (ref 37.5–51.0)
Hemoglobin: 12 g/dL — ABNORMAL LOW (ref 13.0–17.7)
Immature Grans (Abs): 0 10*3/uL (ref 0.0–0.1)
Immature Granulocytes: 0 %
Lymphocytes Absolute: 1.3 10*3/uL (ref 0.7–3.1)
Lymphs: 18 %
MCH: 31.7 pg (ref 26.6–33.0)
MCHC: 32.8 g/dL (ref 31.5–35.7)
MCV: 97 fL (ref 79–97)
Monocytes Absolute: 0.7 10*3/uL (ref 0.1–0.9)
Monocytes: 10 %
Neutrophils Absolute: 4.9 10*3/uL (ref 1.4–7.0)
Neutrophils: 69 %
Platelets: 197 10*3/uL (ref 150–450)
RBC: 3.78 x10E6/uL — ABNORMAL LOW (ref 4.14–5.80)
RDW: 12.9 % (ref 11.6–15.4)
WBC: 7 10*3/uL (ref 3.4–10.8)

## 2019-12-18 LAB — BASIC METABOLIC PANEL
BUN/Creatinine Ratio: 18 (ref 10–24)
BUN: 23 mg/dL (ref 10–36)
CO2: 21 mmol/L (ref 20–29)
Calcium: 8.7 mg/dL (ref 8.6–10.2)
Chloride: 107 mmol/L — ABNORMAL HIGH (ref 96–106)
Creatinine, Ser: 1.29 mg/dL — ABNORMAL HIGH (ref 0.76–1.27)
GFR calc Af Amer: 56 mL/min/{1.73_m2} — ABNORMAL LOW (ref >59)
GFR calc non Af Amer: 48 mL/min/{1.73_m2} — ABNORMAL LOW (ref >59)
Glucose: 190 mg/dL — ABNORMAL HIGH (ref 65–99)
Potassium: 4.3 mmol/L (ref 3.5–5.2)
Sodium: 140 mmol/L (ref 134–144)

## 2019-12-20 ENCOUNTER — Ambulatory Visit (HOSPITAL_COMMUNITY)
Admission: RE | Admit: 2019-12-20 | Discharge: 2019-12-20 | Disposition: A | Discharge status: Home / Self Care | Payer: Medicare HMO | Attending: Internal Medicine | Admitting: Internal Medicine

## 2019-12-20 ENCOUNTER — Ambulatory Visit (HOSPITAL_COMMUNITY): Payer: Medicare HMO

## 2019-12-20 ENCOUNTER — Other Ambulatory Visit: Payer: Self-pay

## 2019-12-20 ENCOUNTER — Ambulatory Visit (HOSPITAL_COMMUNITY)
Admission: RE | Disposition: A | Discharge status: Home / Self Care | Payer: Self-pay | Source: Home / Self Care | Attending: Internal Medicine

## 2019-12-20 DIAGNOSIS — I714 Abdominal aortic aneurysm, without rupture: Secondary | ICD-10-CM | POA: Insufficient documentation

## 2019-12-20 DIAGNOSIS — K219 Gastro-esophageal reflux disease without esophagitis: Secondary | ICD-10-CM | POA: Diagnosis not present

## 2019-12-20 DIAGNOSIS — Z794 Long term (current) use of insulin: Secondary | ICD-10-CM | POA: Insufficient documentation

## 2019-12-20 DIAGNOSIS — Z885 Allergy status to narcotic agent status: Secondary | ICD-10-CM | POA: Diagnosis not present

## 2019-12-20 DIAGNOSIS — Z79899 Other long term (current) drug therapy: Secondary | ICD-10-CM | POA: Insufficient documentation

## 2019-12-20 DIAGNOSIS — Z950 Presence of cardiac pacemaker: Secondary | ICD-10-CM | POA: Diagnosis present

## 2019-12-20 DIAGNOSIS — E039 Hypothyroidism, unspecified: Secondary | ICD-10-CM | POA: Diagnosis not present

## 2019-12-20 DIAGNOSIS — I4891 Unspecified atrial fibrillation: Secondary | ICD-10-CM | POA: Diagnosis not present

## 2019-12-20 DIAGNOSIS — Z20822 Contact with and (suspected) exposure to covid-19: Secondary | ICD-10-CM | POA: Diagnosis not present

## 2019-12-20 DIAGNOSIS — Z7901 Long term (current) use of anticoagulants: Secondary | ICD-10-CM | POA: Diagnosis not present

## 2019-12-20 DIAGNOSIS — Z79890 Hormone replacement therapy (postmenopausal): Secondary | ICD-10-CM | POA: Diagnosis not present

## 2019-12-20 DIAGNOSIS — E785 Hyperlipidemia, unspecified: Secondary | ICD-10-CM | POA: Diagnosis not present

## 2019-12-20 DIAGNOSIS — Z45010 Encounter for checking and testing of cardiac pacemaker pulse generator [battery]: Secondary | ICD-10-CM | POA: Insufficient documentation

## 2019-12-20 DIAGNOSIS — I10 Essential (primary) hypertension: Secondary | ICD-10-CM | POA: Diagnosis not present

## 2019-12-20 DIAGNOSIS — E538 Deficiency of other specified B group vitamins: Secondary | ICD-10-CM | POA: Diagnosis not present

## 2019-12-20 DIAGNOSIS — R001 Bradycardia, unspecified: Secondary | ICD-10-CM | POA: Insufficient documentation

## 2019-12-20 DIAGNOSIS — K5790 Diverticulosis of intestine, part unspecified, without perforation or abscess without bleeding: Secondary | ICD-10-CM | POA: Diagnosis not present

## 2019-12-20 DIAGNOSIS — I442 Atrioventricular block, complete: Secondary | ICD-10-CM

## 2019-12-20 DIAGNOSIS — I2510 Atherosclerotic heart disease of native coronary artery without angina pectoris: Secondary | ICD-10-CM | POA: Diagnosis not present

## 2019-12-20 DIAGNOSIS — E119 Type 2 diabetes mellitus without complications: Secondary | ICD-10-CM | POA: Diagnosis not present

## 2019-12-20 DIAGNOSIS — I4821 Permanent atrial fibrillation: Secondary | ICD-10-CM | POA: Diagnosis present

## 2019-12-20 HISTORY — PX: PPM GENERATOR CHANGEOUT: EP1233

## 2019-12-20 LAB — SARS CORONAVIRUS 2 BY RT PCR (HOSPITAL ORDER, PERFORMED IN ~~LOC~~ HOSPITAL LAB): SARS Coronavirus 2: NEGATIVE

## 2019-12-20 LAB — GLUCOSE, CAPILLARY: Glucose-Capillary: 123 mg/dL — ABNORMAL HIGH (ref 70–99)

## 2019-12-20 SURGERY — PPM GENERATOR CHANGEOUT

## 2019-12-20 MED ORDER — MIDAZOLAM HCL 5 MG/5ML IJ SOLN
INTRAMUSCULAR | Status: AC
  Filled 2019-12-20: qty 5

## 2019-12-20 MED ORDER — ACETAMINOPHEN 325 MG PO TABS: 325.0000 mg | ORAL_TABLET | ORAL | Status: DC | PRN

## 2019-12-20 MED ORDER — CEFAZOLIN SODIUM-DEXTROSE 2-4 GM/100ML-% IV SOLN
INTRAVENOUS | Status: AC
  Filled 2019-12-20: qty 100

## 2019-12-20 MED ORDER — FENTANYL CITRATE (PF) 100 MCG/2ML IJ SOLN
INTRAMUSCULAR | Status: DC | PRN
  Administered 2019-12-20 (×2): 12.5 ug via INTRAVENOUS

## 2019-12-20 MED ORDER — SODIUM CHLORIDE 0.9 % IV SOLN: INTRAVENOUS | Status: DC

## 2019-12-20 MED ORDER — FENTANYL CITRATE (PF) 100 MCG/2ML IJ SOLN
INTRAMUSCULAR | Status: AC
  Filled 2019-12-20: qty 2

## 2019-12-20 MED ORDER — CEFAZOLIN SODIUM-DEXTROSE 2-4 GM/100ML-% IV SOLN
2.0000 g | INTRAVENOUS | Status: AC
  Administered 2019-12-20: 2 g via INTRAVENOUS

## 2019-12-20 MED ORDER — SODIUM CHLORIDE 0.9 % IV SOLN
80.0000 mg | INTRAVENOUS | Status: AC
  Administered 2019-12-20: 80 mg

## 2019-12-20 MED ORDER — MIDAZOLAM HCL 5 MG/5ML IJ SOLN
INTRAMUSCULAR | Status: DC | PRN
  Administered 2019-12-20 (×5): 1 mg via INTRAVENOUS

## 2019-12-20 MED ORDER — HEPARIN (PORCINE) IN NACL 1000-0.9 UT/500ML-% IV SOLN
INTRAVENOUS | Status: DC | PRN
  Administered 2019-12-20: 500 mL

## 2019-12-20 MED ORDER — HEPARIN (PORCINE) IN NACL 1000-0.9 UT/500ML-% IV SOLN
INTRAVENOUS | Status: AC
  Filled 2019-12-20: qty 500

## 2019-12-20 MED ORDER — ONDANSETRON HCL 4 MG/2ML IJ SOLN: 4.0000 mg | Freq: Four times a day (QID) | INTRAMUSCULAR | Status: DC | PRN

## 2019-12-20 MED ORDER — LIDOCAINE HCL (PF) 1 % IJ SOLN
INTRAMUSCULAR | Status: DC | PRN
  Administered 2019-12-20: 60 mL

## 2019-12-20 MED ORDER — CHLORHEXIDINE GLUCONATE 4 % EX LIQD
4.0000 "application " | Freq: Once | CUTANEOUS | Status: DC
  Filled 2019-12-20: qty 60

## 2019-12-20 MED ORDER — CEFAZOLIN SODIUM-DEXTROSE 1-4 GM/50ML-% IV SOLN
1.0000 g | Freq: Once | INTRAVENOUS | Status: AC
  Administered 2019-12-20: 1 g via INTRAVENOUS
  Filled 2019-12-20: qty 50

## 2019-12-20 MED ORDER — SODIUM CHLORIDE 0.9 % IV SOLN
INTRAVENOUS | Status: AC
  Filled 2019-12-20: qty 2

## 2019-12-20 MED ORDER — IOHEXOL 350 MG/ML SOLN
INTRAVENOUS | Status: DC | PRN
  Administered 2019-12-20: 10 mL via INTRAVASCULAR
  Administered 2019-12-20: 10 mL via INTRAVENOUS

## 2019-12-20 MED ORDER — LIDOCAINE HCL 1 % IJ SOLN
INTRAMUSCULAR | Status: AC
  Filled 2019-12-20: qty 60

## 2019-12-20 SURGICAL SUPPLY — 10 items
CABLE SURGICAL S-101-97-12 (CABLE) ×3 IMPLANT
GUIDEWIRE ANGLED .035X150CM (WIRE) ×3 IMPLANT
IPG PACE AZUR XT SR MRI W1SR01 (Pacemaker) ×1 IMPLANT
KIT MICROPUNCTURE NIT STIFF (SHEATH) ×3 IMPLANT
LEAD CAPSURE NOVUS 5076-52CM (Lead) ×3 IMPLANT
PACE AZURE XT SR MRI W1SR01 (Pacemaker) ×3 IMPLANT
PAD PRO RADIOLUCENT 2001M-C (PAD) ×3 IMPLANT
SHEATH 7FR PRELUDE SNAP 13 (SHEATH) ×3 IMPLANT
SHEATH PROBE COVER 6X72 (BAG) ×3 IMPLANT
TRAY PACEMAKER INSERTION (PACKS) ×3 IMPLANT

## 2019-12-20 NOTE — Progress Notes (Addendum)
Client's daughter left the room and client took off sling and got out of bed, confused as to place and time; back to bed and daughter back in room; Dr Lovena Le notified of above and in to see client and per Dr Lovena Le CXR can be portable stat at 1830

## 2019-12-20 NOTE — Progress Notes (Signed)
Client's daughter in room with client

## 2019-12-20 NOTE — Progress Notes (Signed)
Dr Lovena Le notified of cxr results and ok to d/c home; per Dr Lovena Le client may resume eliquis on Sunday 12/26/2019

## 2019-12-20 NOTE — Discharge Instructions (Signed)
After Your Pacemaker   . You have a Medtronic Pacemaker  ACTIVITY . Do not lift your arm above shoulder height for 1 week after your procedure. After 7 days, you may progress as below.     Monday December 27, 2019  Tuesday December 28, 2019 Wednesday December 29, 2019 Thursday December 30, 2019   . Do not lift, push, pull, or carry anything over 10 pounds with the affected arm until 6 weeks (Monday January 31, 2020 ) after your procedure.   . Do NOT DRIVE until you have been seen for your wound check, or as long as instructed by your healthcare provider.   . Ask your healthcare provider when you can go back to work   INCISION/Dressing . If you are on a blood thinner such as Coumadin, Xarelto, Eliquis, Plavix, or Pradaxa please confirm with your provider when this should be resumed ON 12/26/2019  . Monitor your Pacemaker site for redness, swelling, and drainage. Call the device clinic at [phone] if you experience these symptoms or fever/chills.  . If your incision is sealed with Steri-strips or staples, you may shower 10 days after your procedure or when told by your provider. Do not remove the steri-strips or let the shower hit directly on your site. You may wash around your site with soap and water.    [name] Avoid lotions, ointments, or perfumes over your incision until it is well-healed.  . You may use a hot tub or a pool AFTER your wound check appointment if the incision is completely closed.  [name] PAcemaker Alerts:  Some alerts are vibratory and others beep. These are NOT emergencies. Please call our office to let us know. If this occurs at night or on weekends, it can wait until the next business day. Send a remote transmission.  . If your device is capable of reading fluid status (for heart failure), you will be offered monthly monitoring to review this with you.   DEVICE MANAGEMENT . Remote monitoring is used to monitor your pacemaker from home. This monitoring is scheduled every 91  days by our office. It allows [place] to keep an eye on the functioning of your device to ensure it is working properly. You will routinely see your Electrophysiologist annually (more often if necessary).   . You should receive your ID card for your new device in 4-8 weeks. Keep this card with you at all times once received. Consider wearing a medical alert bracelet or necklace.  . Your Pacemaker may be MRI compatible. This will be discussed at your next office visit/wound check.  You should avoid contact with strong electric or magnetic fields.    Do not use amateur (ham) radio equipment or electric (arc) welding torches. MP3 player headphones with magnets should not be used. Some devices are safe to use if held at least 12 inches (30 cm) from your Pacemaker. These include power tools, lawn mowers, and speakers. If you are unsure if something is safe to use, ask your health care provider.   When using your cell phone, hold it to the ear that is on the opposite side from the Pacemaker. Do not leave your cell phone in a pocket over the Pacemaker.   You may safely use electric blankets, heating pads, computers, and microwave ovens.  Call the office right away if:  You have chest pain.  You feel more short of breath than you have felt before.  You feel more light-headed than you have felt before.  Your incision starts to open up.  This information is not intended to replace advice given to you by your health care provider. Make sure you discuss any questions you have with your health care provider.After Your Pacemaker   . You have a Medtronic Pacemaker  ACTIVITY . Do not lift your arm above shoulder height for 1 week after your procedure. After 7 days, you may progress as below.     Monday December 27, 2019  Tuesday December 28, 2019 Wednesday December 29, 2019 Thursday December 30, 2019   . Do not lift, push, pull, or carry anything over 10 pounds with the affected arm until 6 weeks (Monday  January 31, 2020 ) after your procedure.   . Do NOT DRIVE until you have been seen for your wound check, or as long as instructed by your healthcare provider.   . Ask your healthcare provider when you can go back to work   INCISION/Dressing .   [name] Monitor your Pacemaker site for redness, swelling, and drainage. Call the device clinic at [phone] if you experience these symptoms or fever/chills.  . If your incision is sealed with Steri-strips or staples, you may shower 10 days after your procedure or when told by your provider. Do not remove the steri-strips or let the shower hit directly on your site. You may wash around your site with soap and water.    [name] Avoid lotions, ointments, or perfumes over your incision until it is well-healed.  . You may use a hot tub or a pool AFTER your wound check appointment if the incision is completely closed.  [name] PAcemaker Alerts:  Some alerts are vibratory and others beep. These are NOT emergencies. Please call our office to let us know. If this occurs at night or on weekends, it can wait until the next business day. Send a remote transmission.  . If your device is capable of reading fluid status (for heart failure), you will be offered monthly monitoring to review this with you.   DEVICE MANAGEMENT . Remote monitoring is used to monitor your pacemaker from home. This monitoring is scheduled every 91 days by our office. It allows [place] to keep an eye on the functioning of your device to ensure it is working properly. You will routinely see your Electrophysiologist annually (more often if necessary).   . You should receive your ID card for your new device in 4-8 weeks. Keep this card with you at all times once received. Consider wearing a medical alert bracelet or necklace.  . Your Pacemaker may be MRI compatible. This will be discussed at your next office visit/wound check.  You should avoid contact with strong electric or magnetic fields.    Do not  use amateur (ham) radio equipment or electric (arc) welding torches. MP3 player headphones with magnets should not be used. Some devices are safe to use if held at least 12 inches (30 cm) from your Pacemaker. These include power tools, lawn mowers, and speakers. If you are unsure if something is safe to use, ask your health care provider.   When using your cell phone, hold it to the ear that is on the opposite side from the Pacemaker. Do not leave your cell phone in a pocket over the Pacemaker.   You may safely use electric blankets, heating pads, computers, and microwave ovens.  Call the office right away if:  You have chest pain.  You feel more short of breath than you have felt before.  You feel more light-headed than you have felt before.  Your incision starts to open up.  This information is not intended to replace advice given to you by your health care provider. Make sure you discuss any questions you have with your health care provider.RESUME ELIQUIS ON Sunday 12/26/2019     Pacemaker Implantation, Adult, Care After This sheet gives you information about how to care for yourself after your procedure. Your health care provider may also give you more specific instructions. If you have problems or questions, contact your health care provider. What can I expect after the procedure? After the procedure, it is common to have:  Mild pain.  Slight bruising.  Some swelling over the incision.  A slight bump over the skin where the device was placed. Sometimes, it is possible to feel the device under the skin. This is normal. Follow these instructions at home: Medicines  Take over-the-counter and prescription medicines only as told by your health care provider.  If you were prescribed an antibiotic medicine, take it as told by your health care provider. Do not stop taking the antibiotic even if you start to feel better. Wound care   Do not remove the bandage on your chest  until directed to do so by your health care provider.  After your bandage is removed, you may see pieces of tape called skin adhesive strips over the area where the cut was made (incision site). Let them fall off on their own.  Check the incision site every day to make sure it is not infected, bleeding, or starting to pull apart.  Do not use lotions or ointments near the incision site unless directed to do so.  Keep the incision area clean and dry for 2-3 days after the procedure or as directed by your health care provider. It takes several weeks for the incision site to completely heal.  Do not take baths, swim, or use a hot tub for 7-10 days or as otherwise directed by your health care provider. Activity  Do not drive or use heavy machinery while taking prescription pain medicine.  Do not drive for 24 hours if you were given a medicine to help you relax (sedative).  Check with your health care provider before you start to drive or play sports.  Avoid sudden jerking, pulling, or chopping movements that pull your upper arm far away from your body. Avoid these movements for at least 6 weeks or as long as told by your health care provider.  Do not lift your upper arm above your shoulders for at least 6 weeks or as long as told by your health care provider. This means no tennis, golf, or swimming.  You may go back to work when your health care provider says it is okay. Pacemaker care  You may be shown how to transfer data from your pacemaker through the phone to your health care provider.  Always let all health care providers know about your pacemaker before you have any medical procedures or tests.  Wear a medical ID bracelet or necklace stating that you have a pacemaker. Carry a pacemaker ID card with you at all times.  Your pacemaker battery will last for 5-15 years. Routine checks by your health care provider will let the health care provider know when the battery is starting to run  down. The pacemaker will need to be replaced when the battery starts to run down.  Do not use amateur Chief of Staff. Other  electrical devices are safe to use, including power tools, lawn mowers, and speakers. If you are unsure of whether something is safe to use, ask your health care provider.  When using your cell phone, hold it to the ear opposite the pacemaker. Do not leave your cell phone in a pocket over the pacemaker.  Avoid places or objects that have a strong electric or magnetic field, including: ? Airport Herbalist. When at the airport, let officials know that you have a pacemaker. ? Power plants. ? Large electrical generators. ? Radiofrequency transmission towers, such as cell phone and radio towers. General instructions  Weigh yourself every day. If you suddenly gain weight, fluid may be building up in your body.  Keep all follow-up visits as told by your health care provider. This is important. Contact a health care provider if:  You gain weight suddenly.  Your legs or feet swell.  It feels like your heart is fluttering or skipping beats (heart palpitations).  You have chills or a fever.  You have more redness, swelling, or pain around your incisions.  You have more fluid or blood coming from your incisions.  Your incisions feel warm to the touch.  You have pus or a bad smell coming from your incisions. Get help right away if:  You have chest pain.  You have trouble breathing or are short of breath.  You become extremely tired.  You are light-headed or you faint. This information is not intended to replace advice given to you by your health care provider. Make sure you discuss any questions you have with your health care provider. Document Revised: 04/11/2017 Document Reviewed: 02/09/2016 Elsevier Patient Education  2020 [employer].

## 2019-12-20 NOTE — Interval H&P Note (Signed)
History and Physical Interval Note:  12/20/2019 2:24 PM  [name]  has presented today for surgery, with the diagnosis of ERI.  The various methods of treatment have been discussed with the patient and family. After consideration of risks, benefits and other options for treatment, the patient has consented to  Procedure(s): PPM GENERATOR CHANGEOUT (N/A) and insertion of a new ventricular lead as a surgical intervention.  The patient's history has been reviewed, patient examined, no change in status, stable for surgery.  I have reviewed the patient's chart and labs.  Questions were answered to the patient's satisfaction.     Cristopher Peru

## 2019-12-21 ENCOUNTER — Encounter (HOSPITAL_COMMUNITY): Payer: Self-pay | Admitting: Internal Medicine

## 2019-12-21 MED FILL — Lidocaine HCl Local Inj 1%: INTRAMUSCULAR | Qty: 60 | Status: AC

## 2019-12-23 DIAGNOSIS — M6281 Muscle weakness (generalized): Secondary | ICD-10-CM | POA: Diagnosis not present

## 2019-12-23 DIAGNOSIS — I63441 Cerebral infarction due to embolism of right cerebellar artery: Secondary | ICD-10-CM | POA: Diagnosis not present

## 2019-12-24 DIAGNOSIS — M6281 Muscle weakness (generalized): Secondary | ICD-10-CM | POA: Diagnosis not present

## 2019-12-24 DIAGNOSIS — I63441 Cerebral infarction due to embolism of right cerebellar artery: Secondary | ICD-10-CM | POA: Diagnosis not present

## 2019-12-30 ENCOUNTER — Other Ambulatory Visit: Payer: Self-pay

## 2019-12-30 ENCOUNTER — Telehealth (INDEPENDENT_AMBULATORY_CARE_PROVIDER_SITE_OTHER): Payer: Medicare HMO | Admitting: Emergency Medicine

## 2019-12-30 DIAGNOSIS — I442 Atrioventricular block, complete: Secondary | ICD-10-CM

## 2020-01-05 NOTE — Progress Notes (Signed)
Video visit done with assistance of Loxy LPN due to patient being on quarantine status in facility for Covid-19. Steri- strips removed from the wound prior to the visit. Wound edges appear approximated, no hematoma, drainage or edema  Noted. Loxy confirmed patient has not had a fever or chills. Assisted with sending remote transmission. Transmission shows device function WNL, no high v-rates, presenting rhythm v-paced at 60 bpm. Instructed facility staff to call device clinic if the patient develops redness, drainage, edema, or bleeding at wound site. DC to be notified if patient develops a fever or chills.

## 2020-03-20 ENCOUNTER — Ambulatory Visit (INDEPENDENT_AMBULATORY_CARE_PROVIDER_SITE_OTHER): Payer: Medicare HMO

## 2020-03-20 DIAGNOSIS — I4821 Permanent atrial fibrillation: Secondary | ICD-10-CM

## 2020-03-21 LAB — CUP PACEART REMOTE DEVICE CHECK
Battery Remaining Longevity: 150 mo
Battery Voltage: 3.16 V
Brady Statistic RV Percent Paced: 88.51 %
Date Time Interrogation Session: 20211107183531
Implantable Lead Implant Date: 19950413
Implantable Lead Location: 753860
Implantable Pulse Generator Implant Date: 20210809
Lead Channel Impedance Value: 380 Ohm
Lead Channel Impedance Value: 494 Ohm
Lead Channel Pacing Threshold Amplitude: 0.5 V
Lead Channel Pacing Threshold Pulse Width: 0.4 ms
Lead Channel Sensing Intrinsic Amplitude: 5.125 mV
Lead Channel Sensing Intrinsic Amplitude: 5.125 mV
Lead Channel Setting Pacing Amplitude: 3.5 V
Lead Channel Setting Pacing Pulse Width: 0.4 ms
Lead Channel Setting Sensing Sensitivity: 0.9 mV

## 2020-03-22 NOTE — Progress Notes (Signed)
Remote pacemaker transmission.   

## 2020-04-04 ENCOUNTER — Encounter: Payer: Self-pay | Admitting: Internal Medicine

## 2020-04-04 ENCOUNTER — Other Ambulatory Visit: Payer: Self-pay

## 2020-04-04 ENCOUNTER — Ambulatory Visit (INDEPENDENT_AMBULATORY_CARE_PROVIDER_SITE_OTHER): Payer: Medicare HMO | Admitting: Internal Medicine

## 2020-04-04 VITALS — BP 118/58 | HR 63 | Ht 66.0 in | Wt 144.4 lb

## 2020-04-04 DIAGNOSIS — Z950 Presence of cardiac pacemaker: Secondary | ICD-10-CM | POA: Diagnosis not present

## 2020-04-04 DIAGNOSIS — I442 Atrioventricular block, complete: Secondary | ICD-10-CM | POA: Diagnosis not present

## 2020-04-04 DIAGNOSIS — I10 Essential (primary) hypertension: Secondary | ICD-10-CM

## 2020-04-04 DIAGNOSIS — I4821 Permanent atrial fibrillation: Secondary | ICD-10-CM | POA: Diagnosis not present

## 2020-04-04 NOTE — Patient Instructions (Signed)
Medication Instructions:  Your physician recommends that you continue on your current medications as directed. Please refer to the Current Medication list given to you today.  Labwork: None ordered.  Testing/Procedures: None ordered.  Follow-Up: Your physician wants you to follow-up in: one year with Dr. Lovena Le.   You will receive a reminder letter in the mail two months in advance. If you don't receive a letter, please call our office to schedule the follow-up appointment.  Remote monitoring is used to monitor your Pacemaker from home. This monitoring reduces the number of office visits required to check your device to one time per year. It allows [place] to keep an eye on the functioning of your device to ensure it is working properly. You are scheduled for a device check from home on 06/19/2020. You may send your transmission at any time that day. If you have a wireless device, the transmission will be sent automatically. After your physician reviews your transmission, you will receive a postcard with your next transmission date.  Any Other Special Instructions Will Be Listed Below (If Applicable).  If you need a refill on your cardiac medications before your next appointment, please call your pharmacy.

## 2020-04-04 NOTE — Progress Notes (Signed)
HPI Mr. Aiello returns today for followup. He is an 86 year old man with a history of symptomatic bradycardia, status post permanent pacemaker insertion over 32 years ago. He is status post his second replacement approximately9yearsago. In the interim, no chest pain, shortness of breath, or syncope. No peripheral edema. He denies fevers or chills, night sweats, or recurrent nausea or vomiting.He reached ERI on his PPM and underwent PM gen change out 3 months ago. He feels well. He is pleasantly demented.  Allergies  Allergen Reactions  . Triamterene   . Ace Inhibitors Cough  . Codeine Phosphate Nausea Only  . Hydrochlorothiazide W-Triamterene Other (See Comments)    CRAMPING     Current Outpatient Medications  Medication Sig Dispense Refill  . apixaban (ELIQUIS) 5 MG TABS tablet Take 1 tablet (5 mg total) by mouth 2 (two) times daily. 180 tablet 1  . azelastine (ASTELIN) 0.1 % nasal spray Place 2 sprays into both nostrils at bedtime as needed for rhinitis or allergies. Use in each nostril as directed 30 mL 12  . Blood Glucose Monitoring Suppl (ACCU-CHEK GUIDE) w/Device KIT 1 Device by Does not apply route daily. 1 kit 0  . digoxin (LANOXIN) 0.125 MG tablet TAKE 1 TABLET BY MOUTH EVERY OTHER DAY 45 tablet 6  . dorzolamide (TRUSOPT) 2 % ophthalmic solution Place 1 drop into both eyes 2 (two) times daily.    . dorzolamide-timolol (COSOPT) 22.3-6.8 MG/ML ophthalmic solution Place 1 drop into both eyes 2 (two) times daily. 10 mL 12  . escitalopram (LEXAPRO) 10 MG tablet Take 1 tablet (10 mg total) by mouth daily. 90 tablet 1  . fenofibrate micronized (LOFIBRA) 134 MG capsule Take 1 capsule (134 mg total) by mouth daily. 90 capsule 1  . glucose blood (ACCU-CHEK GUIDE) test strip Used to check blood sugars 2x daily. 100 each 12  . insulin aspart (NOVOLOG) 100 UNIT/ML injection CBG 70 - 120: 0 units CBG 121 - 150: 1 unit CBG 151 - 200: 2 units CBG 201 - 250: 3 units CBG 251 - 300: 5  units CBG 301 - 350: 7 units CBG 351 - 400: 9 units 10 mL 11  . Insulin Pen Needle 31G X 6 MM MISC To use w/ Lantus 100 each 12  . levothyroxine (SYNTHROID) 100 MCG tablet Take 1 tablet (100 mcg total) by mouth daily before breakfast. 90 tablet 1  . loratadine (CLARITIN) 10 MG tablet Take 10 mg by mouth daily.    . melatonin 3 MG TABS tablet Take 6 mg by mouth at bedtime.    . metoprolol tartrate (LOPRESSOR) 25 MG tablet Take 0.5 tablets (12.5 mg total) by mouth 2 (two) times daily. 60 tablet 0  . mirtazapine (REMERON) 15 MG tablet Take 1 tablet (15 mg total) by mouth at bedtime. 30 tablet 0  . nitroGLYCERIN (NITROSTAT) 0.4 MG SL tablet Place 1 tablet (0.4 mg total) under the tongue every 5 (five) minutes x 3 doses as needed for chest pain. Then contact 911 or go to ER 25 tablet 3  . pantoprazole (PROTONIX) 40 MG tablet Take 1 tablet (40 mg total) by mouth daily before breakfast. 90 tablet 3  . pravastatin (PRAVACHOL) 40 MG tablet Take 2 tablets (80 mg total) by mouth daily. 180 tablet 1  . senna-docusate (SENOKOT-S) 8.6-50 MG tablet Take 1 tablet by mouth at bedtime as needed for moderate constipation.    . sitaGLIPtin (JANUVIA) 100 MG tablet Take 1 tablet (100 mg total) by  mouth daily. 90 tablet 1   No current facility-administered medications for this visit.     Past Medical History:  Diagnosis Date  . AAA (abdominal aortic aneurysm) ([place])   . Anemia due to chronic blood loss 11/25/2008   Recurrent over the years EGD and colonoscopy x 2 each 2004 and 2010 without cause    . Atrial fibrillation ([place])   . BPH (benign prostatic hyperplasia)    reports aprocedure (TURP) remotely in [place].Marland KitchenNo futher f/u w/ urology  . CAD (coronary artery disease)   . Diabetes mellitus, type 2 ([place])   . Diverticulosis    left colon  . Erosive gastritis   . Fall 08/08/2016   OUTSIDE AT HOME FACIAL TRAUMA   . GERD (gastroesophageal reflux disease)   . Hyperlipidemia   . Hypertension   . Hypothyroidism    . Insomnia    transient  . Osteopenia    dexa 2-11  . Vitamin B12 deficiency     ROS:   All systems reviewed and negative except as noted in the HPI.   Past Surgical History:  Procedure Laterality Date  . CARDIAC CATHETERIZATION  01/31/06, 09/25/10, 09-2011  . CATARACT EXTRACTION     right  . CHOLECYSTECTOMY, LAPAROSCOPIC    . COLONOSCOPY     multiple  . CORONARY ARTERY BYPASS GRAFT     1999 stents in 2000  . ESOPHAGOGASTRODUODENOSCOPY     multiple  . GIVENS CAPSULE STUDY N/A 11/04/2012   Procedure: GIVENS CAPSULE STUDY;  Surgeon: Gatha Mayer, MD;  Location: [facility] ENDOSCOPY;  Service: Endoscopy;  Laterality: N/A;  . inguinal herniorrhaphies     bilateral  . LEFT HEART CATHETERIZATION WITH CORONARY ANGIOGRAM N/A 10/04/2011   Procedure: LEFT HEART CATHETERIZATION WITH CORONARY ANGIOGRAM;  Surgeon: Burnell Blanks, MD;  [facility] CATH LAB;  Service: Cardiovascular;  Laterality: N/A;  . pacemaker  [place], 2013   medtronic minix [id number]  . PENILE PROSTHESIS IMPLANT  1992  . PERMANENT PACEMAKER GENERATOR CHANGE N/A 06/05/2011   Procedure: PERMANENT PACEMAKER GENERATOR CHANGE;  Surgeon: Evans Lance, MD;  Location: [facility] CATH LAB;  Service: Cardiovascular;  Laterality: N/A;  . PPM GENERATOR CHANGEOUT N/A 12/20/2019   Procedure: PPM GENERATOR CHANGEOUT;  Surgeon: Evans Lance, MD;  Location: [facility] CV LAB;  Service: Cardiovascular;  Laterality: N/A;  . PROSTATECTOMY     transurethral  . right hip replacement    . TRANSTHORACIC ECHOCARDIOGRAM  12/2006     Family History  Problem Relation Age of Onset  . Alzheimer's disease Mother   . Heart failure Father   . CVA Father   . Coronary artery disease Brother        cabg  . Alzheimer's disease Sister   . Mental illness Sister   . Alzheimer's disease Sister   . Other Sister        lung problems  . Diabetes Other        several siblings  . Prostate cancer Neg Hx   . Colon cancer Neg Hx   . Esophageal cancer Neg  Hx   . Stomach cancer Neg Hx   . Rectal cancer Neg Hx      Social History   Socioeconomic History  . Marital status: Legally Separated    Spouse name: Not on file  . Number of children: 5  . Years of education: Not on file  . Highest education level: Not on file  Occupational History  . Occupation: retired  Employer: RETIRED  Tobacco Use  . Smoking status: Never Smoker  . Smokeless tobacco: Never Used  Vaping Use  . Vaping Use: Never used  Substance and Sexual Activity  . Alcohol use: Yes    Alcohol/week: 1.0 - 2.0 standard drink    Types: 1 - 2 Glasses of wine per week    Comment: socially   . Drug use: No  . Sexual activity: Not Currently  Other Topics Concern  . Not on file  Social History Narrative   Lives by self, independent on ADL, retired. Separated from wife.    Daughters:   [name] ( lives in [place]) [phone]   [name] ( lives out of town)  [phone]         Social Determinants of Health   Financial Resource Strain:   . Difficulty of Paying Living Expenses: Not on file  Food Insecurity:   . Worried About Charity fundraiser in the Last Year: Not on file  . Ran Out of Food in the Last Year: Not on file  Transportation Needs:   . Lack of Transportation (Medical): Not on file  . Lack of Transportation (Non-Medical): Not on file  Physical Activity:   . Days of Exercise per Week: Not on file  . Minutes of Exercise per Session: Not on file  Stress:   . Feeling of Stress : Not on file  Social Connections:   . Frequency of Communication with Friends and Family: Not on file  . Frequency of Social Gatherings with Friends and Family: Not on file  . Attends Religious Services: Not on file  . Active Member of Clubs or Organizations: Not on file  . Attends Archivist Meetings: Not on file  . Marital Status: Not on file  Intimate Partner Violence:   . Fear of Current or Ex-Partner: Not on file  . Emotionally Abused: Not on file  . Physically  Abused: Not on file  . Sexually Abused: Not on file     BP (!) 118/58   Pulse 63   Ht _0  (1.676 m)   Wt 144 lb 6.4 oz (65.5 kg)   SpO2 96%   BMI 23.31 kg/m   Physical Exam:  stable appearing elderly man, NAD HEENT: Unremarkable Neck:  6 cm JVD, no thyromegally Lymphatics:  No adenopathy Back:  No CVA tenderness Lungs:  Clear with no wheezes HEART:  IRegular rate rhythm, no murmurs, no rubs, no clicks Abd:  soft, positive bowel sounds, no organomegally, no rebound, no guarding Ext:  2 plus pulses, no edema, no cyanosis, no clubbing Skin:  No rashes no nodules Neuro:  CN II through XII intact, motor grossly intact  EKG - atrial fib with ventricular pacing  DEVICE  Normal device function.  See PaceArt for details.   Assess/Plan: 1. ATrial fib - his VR is well controlled and he is asymptomatic.  2. PPM - his medtronic single chamber PPM is working normally. 3. HTN - his bp is well controlled. No change. 4. Coags - he has not had any bleeding on eliquis. We will continue.  Carleene Overlie Kenyatte Chatmon,MD

## 2020-04-13 ENCOUNTER — Other Ambulatory Visit: Payer: Self-pay | Admitting: Internal Medicine

## 2020-06-19 ENCOUNTER — Ambulatory Visit (INDEPENDENT_AMBULATORY_CARE_PROVIDER_SITE_OTHER): Payer: Medicare HMO

## 2020-06-19 DIAGNOSIS — I4821 Permanent atrial fibrillation: Secondary | ICD-10-CM | POA: Diagnosis not present

## 2020-06-21 LAB — CUP PACEART REMOTE DEVICE CHECK
Battery Remaining Longevity: 159 mo
Battery Voltage: 3.14 V
Brady Statistic RV Percent Paced: 92.53 %
Date Time Interrogation Session: 20220206220253
Implantable Lead Implant Date: 19950413
Implantable Lead Location: 753860
Implantable Pulse Generator Implant Date: 20210809
Lead Channel Impedance Value: 361 Ohm
Lead Channel Impedance Value: 494 Ohm
Lead Channel Pacing Threshold Amplitude: 0.625 V
Lead Channel Pacing Threshold Pulse Width: 0.4 ms
Lead Channel Sensing Intrinsic Amplitude: 5.5 mV
Lead Channel Sensing Intrinsic Amplitude: 5.5 mV
Lead Channel Setting Pacing Amplitude: 2 V
Lead Channel Setting Pacing Pulse Width: 0.4 ms
Lead Channel Setting Sensing Sensitivity: 1.2 mV

## 2020-06-26 NOTE — Progress Notes (Signed)
Remote pacemaker transmission.   

## 2020-07-04 ENCOUNTER — Telehealth: Payer: Medicare HMO

## 2020-07-04 ENCOUNTER — Encounter: Payer: Medicare HMO | Admitting: Internal Medicine

## 2020-07-20 ENCOUNTER — Encounter: Payer: Self-pay | Admitting: Internal Medicine

## 2020-09-05 DIAGNOSIS — R296 Repeated falls: Secondary | ICD-10-CM | POA: Diagnosis not present

## 2020-09-05 DIAGNOSIS — R2681 Unsteadiness on feet: Secondary | ICD-10-CM | POA: Diagnosis not present

## 2020-09-06 DIAGNOSIS — R296 Repeated falls: Secondary | ICD-10-CM | POA: Diagnosis not present

## 2020-09-06 DIAGNOSIS — R2681 Unsteadiness on feet: Secondary | ICD-10-CM | POA: Diagnosis not present

## 2020-09-07 DIAGNOSIS — R296 Repeated falls: Secondary | ICD-10-CM | POA: Diagnosis not present

## 2020-09-07 DIAGNOSIS — R2681 Unsteadiness on feet: Secondary | ICD-10-CM | POA: Diagnosis not present

## 2020-09-08 DIAGNOSIS — R296 Repeated falls: Secondary | ICD-10-CM | POA: Diagnosis not present

## 2020-09-08 DIAGNOSIS — R2681 Unsteadiness on feet: Secondary | ICD-10-CM | POA: Diagnosis not present

## 2020-09-18 ENCOUNTER — Ambulatory Visit (INDEPENDENT_AMBULATORY_CARE_PROVIDER_SITE_OTHER): Payer: Medicare HMO

## 2020-09-18 DIAGNOSIS — I4821 Permanent atrial fibrillation: Secondary | ICD-10-CM

## 2020-09-19 LAB — CUP PACEART REMOTE DEVICE CHECK
Battery Remaining Longevity: 156 mo
Battery Voltage: 3.09 V
Brady Statistic RV Percent Paced: 84.97 %
Date Time Interrogation Session: 20220509051443
Implantable Lead Implant Date: 19950413
Implantable Lead Location: 753860
Implantable Pulse Generator Implant Date: 20210809
Lead Channel Impedance Value: 361 Ohm
Lead Channel Impedance Value: 475 Ohm
Lead Channel Pacing Threshold Amplitude: 0.5 V
Lead Channel Pacing Threshold Pulse Width: 0.4 ms
Lead Channel Sensing Intrinsic Amplitude: 6 mV
Lead Channel Sensing Intrinsic Amplitude: 6 mV
Lead Channel Setting Pacing Amplitude: 2 V
Lead Channel Setting Pacing Pulse Width: 0.4 ms
Lead Channel Setting Sensing Sensitivity: 1.2 mV

## 2020-10-03 ENCOUNTER — Encounter: Payer: Medicare HMO | Admitting: Internal Medicine

## 2020-10-03 ENCOUNTER — Telehealth: Payer: Medicare HMO

## 2020-10-10 NOTE — Progress Notes (Signed)
Remote pacemaker transmission.   

## 2020-12-18 ENCOUNTER — Ambulatory Visit (INDEPENDENT_AMBULATORY_CARE_PROVIDER_SITE_OTHER): Payer: Medicare HMO

## 2020-12-18 DIAGNOSIS — I442 Atrioventricular block, complete: Secondary | ICD-10-CM | POA: Diagnosis not present

## 2020-12-24 LAB — CUP PACEART REMOTE DEVICE CHECK
Battery Remaining Longevity: 155 mo
Battery Voltage: 3.06 V
Brady Statistic RV Percent Paced: 84.71 %
Date Time Interrogation Session: 20220812111128
Implantable Lead Implant Date: 19950413
Implantable Lead Location: 753860
Implantable Pulse Generator Implant Date: 20210809
Lead Channel Impedance Value: 380 Ohm
Lead Channel Impedance Value: 513 Ohm
Lead Channel Pacing Threshold Amplitude: 0.625 V
Lead Channel Pacing Threshold Pulse Width: 0.4 ms
Lead Channel Sensing Intrinsic Amplitude: 5.125 mV
Lead Channel Sensing Intrinsic Amplitude: 5.125 mV
Lead Channel Setting Pacing Amplitude: 2 V
Lead Channel Setting Pacing Pulse Width: 0.4 ms
Lead Channel Setting Sensing Sensitivity: 1.2 mV

## 2021-01-02 ENCOUNTER — Encounter: Payer: Medicare HMO | Admitting: Internal Medicine

## 2021-01-02 ENCOUNTER — Telehealth: Payer: Medicare HMO

## 2021-01-11 NOTE — Progress Notes (Signed)
Remote pacemaker transmission.   

## 2021-02-12 DIAGNOSIS — I10 Essential (primary) hypertension: Secondary | ICD-10-CM | POA: Diagnosis not present

## 2021-02-12 DIAGNOSIS — I4821 Permanent atrial fibrillation: Secondary | ICD-10-CM | POA: Diagnosis not present

## 2021-02-12 DIAGNOSIS — F0390 Unspecified dementia without behavioral disturbance: Secondary | ICD-10-CM | POA: Diagnosis not present

## 2021-02-12 DIAGNOSIS — F05 Delirium due to known physiological condition: Secondary | ICD-10-CM | POA: Diagnosis not present

## 2021-02-12 DIAGNOSIS — E118 Type 2 diabetes mellitus with unspecified complications: Secondary | ICD-10-CM | POA: Diagnosis not present

## 2021-02-12 DIAGNOSIS — R296 Repeated falls: Secondary | ICD-10-CM | POA: Diagnosis not present

## 2021-02-12 DIAGNOSIS — I63441 Cerebral infarction due to embolism of right cerebellar artery: Secondary | ICD-10-CM | POA: Diagnosis not present

## 2021-02-13 DIAGNOSIS — R296 Repeated falls: Secondary | ICD-10-CM | POA: Diagnosis not present

## 2021-02-13 DIAGNOSIS — F0390 Unspecified dementia without behavioral disturbance: Secondary | ICD-10-CM | POA: Diagnosis not present

## 2021-02-13 DIAGNOSIS — I4821 Permanent atrial fibrillation: Secondary | ICD-10-CM | POA: Diagnosis not present

## 2021-02-13 DIAGNOSIS — F05 Delirium due to known physiological condition: Secondary | ICD-10-CM | POA: Diagnosis not present

## 2021-02-13 DIAGNOSIS — I10 Essential (primary) hypertension: Secondary | ICD-10-CM | POA: Diagnosis not present

## 2021-02-13 DIAGNOSIS — I63441 Cerebral infarction due to embolism of right cerebellar artery: Secondary | ICD-10-CM | POA: Diagnosis not present

## 2021-02-13 DIAGNOSIS — E118 Type 2 diabetes mellitus with unspecified complications: Secondary | ICD-10-CM | POA: Diagnosis not present

## 2021-02-14 DIAGNOSIS — I4821 Permanent atrial fibrillation: Secondary | ICD-10-CM | POA: Diagnosis not present

## 2021-02-14 DIAGNOSIS — R296 Repeated falls: Secondary | ICD-10-CM | POA: Diagnosis not present

## 2021-02-14 DIAGNOSIS — I63441 Cerebral infarction due to embolism of right cerebellar artery: Secondary | ICD-10-CM | POA: Diagnosis not present

## 2021-02-14 DIAGNOSIS — I10 Essential (primary) hypertension: Secondary | ICD-10-CM | POA: Diagnosis not present

## 2021-02-14 DIAGNOSIS — E118 Type 2 diabetes mellitus with unspecified complications: Secondary | ICD-10-CM | POA: Diagnosis not present

## 2021-02-14 DIAGNOSIS — F05 Delirium due to known physiological condition: Secondary | ICD-10-CM | POA: Diagnosis not present

## 2021-02-14 DIAGNOSIS — F0390 Unspecified dementia without behavioral disturbance: Secondary | ICD-10-CM | POA: Diagnosis not present

## 2021-02-15 DIAGNOSIS — I10 Essential (primary) hypertension: Secondary | ICD-10-CM | POA: Diagnosis not present

## 2021-02-15 DIAGNOSIS — E118 Type 2 diabetes mellitus with unspecified complications: Secondary | ICD-10-CM | POA: Diagnosis not present

## 2021-02-15 DIAGNOSIS — R296 Repeated falls: Secondary | ICD-10-CM | POA: Diagnosis not present

## 2021-02-15 DIAGNOSIS — I4821 Permanent atrial fibrillation: Secondary | ICD-10-CM | POA: Diagnosis not present

## 2021-02-15 DIAGNOSIS — F05 Delirium due to known physiological condition: Secondary | ICD-10-CM | POA: Diagnosis not present

## 2021-02-15 DIAGNOSIS — I63441 Cerebral infarction due to embolism of right cerebellar artery: Secondary | ICD-10-CM | POA: Diagnosis not present

## 2021-02-15 DIAGNOSIS — F0390 Unspecified dementia without behavioral disturbance: Secondary | ICD-10-CM | POA: Diagnosis not present

## 2021-02-16 DIAGNOSIS — E118 Type 2 diabetes mellitus with unspecified complications: Secondary | ICD-10-CM | POA: Diagnosis not present

## 2021-02-16 DIAGNOSIS — F05 Delirium due to known physiological condition: Secondary | ICD-10-CM | POA: Diagnosis not present

## 2021-02-16 DIAGNOSIS — I63441 Cerebral infarction due to embolism of right cerebellar artery: Secondary | ICD-10-CM | POA: Diagnosis not present

## 2021-02-16 DIAGNOSIS — I10 Essential (primary) hypertension: Secondary | ICD-10-CM | POA: Diagnosis not present

## 2021-02-16 DIAGNOSIS — F0390 Unspecified dementia without behavioral disturbance: Secondary | ICD-10-CM | POA: Diagnosis not present

## 2021-02-16 DIAGNOSIS — I4821 Permanent atrial fibrillation: Secondary | ICD-10-CM | POA: Diagnosis not present

## 2021-02-16 DIAGNOSIS — R296 Repeated falls: Secondary | ICD-10-CM | POA: Diagnosis not present

## 2021-04-03 ENCOUNTER — Encounter: Payer: Medicare HMO | Admitting: Internal Medicine

## 2021-04-03 ENCOUNTER — Telehealth: Payer: Medicare HMO

## 2021-04-09 DIAGNOSIS — I63441 Cerebral infarction due to embolism of right cerebellar artery: Secondary | ICD-10-CM | POA: Diagnosis not present

## 2021-04-09 DIAGNOSIS — R296 Repeated falls: Secondary | ICD-10-CM | POA: Diagnosis not present

## 2021-04-09 DIAGNOSIS — F0390 Unspecified dementia without behavioral disturbance: Secondary | ICD-10-CM | POA: Diagnosis not present

## 2021-04-09 DIAGNOSIS — F05 Delirium due to known physiological condition: Secondary | ICD-10-CM | POA: Diagnosis not present

## 2021-04-09 DIAGNOSIS — I4821 Permanent atrial fibrillation: Secondary | ICD-10-CM | POA: Diagnosis not present

## 2021-04-09 DIAGNOSIS — E118 Type 2 diabetes mellitus with unspecified complications: Secondary | ICD-10-CM | POA: Diagnosis not present

## 2021-04-09 DIAGNOSIS — I10 Essential (primary) hypertension: Secondary | ICD-10-CM | POA: Diagnosis not present

## 2021-04-10 DIAGNOSIS — I10 Essential (primary) hypertension: Secondary | ICD-10-CM | POA: Diagnosis not present

## 2021-04-10 DIAGNOSIS — R296 Repeated falls: Secondary | ICD-10-CM | POA: Diagnosis not present

## 2021-04-10 DIAGNOSIS — E118 Type 2 diabetes mellitus with unspecified complications: Secondary | ICD-10-CM | POA: Diagnosis not present

## 2021-04-10 DIAGNOSIS — I63441 Cerebral infarction due to embolism of right cerebellar artery: Secondary | ICD-10-CM | POA: Diagnosis not present

## 2021-04-10 DIAGNOSIS — F05 Delirium due to known physiological condition: Secondary | ICD-10-CM | POA: Diagnosis not present

## 2021-04-10 DIAGNOSIS — I4821 Permanent atrial fibrillation: Secondary | ICD-10-CM | POA: Diagnosis not present

## 2021-04-10 DIAGNOSIS — F0390 Unspecified dementia without behavioral disturbance: Secondary | ICD-10-CM | POA: Diagnosis not present

## 2021-04-11 DIAGNOSIS — I63441 Cerebral infarction due to embolism of right cerebellar artery: Secondary | ICD-10-CM | POA: Diagnosis not present

## 2021-04-11 DIAGNOSIS — F05 Delirium due to known physiological condition: Secondary | ICD-10-CM | POA: Diagnosis not present

## 2021-04-11 DIAGNOSIS — F0390 Unspecified dementia without behavioral disturbance: Secondary | ICD-10-CM | POA: Diagnosis not present

## 2021-04-11 DIAGNOSIS — I4821 Permanent atrial fibrillation: Secondary | ICD-10-CM | POA: Diagnosis not present

## 2021-04-11 DIAGNOSIS — R296 Repeated falls: Secondary | ICD-10-CM | POA: Diagnosis not present

## 2021-04-11 DIAGNOSIS — E118 Type 2 diabetes mellitus with unspecified complications: Secondary | ICD-10-CM | POA: Diagnosis not present

## 2021-04-11 DIAGNOSIS — I10 Essential (primary) hypertension: Secondary | ICD-10-CM | POA: Diagnosis not present

## 2021-04-12 DIAGNOSIS — R1311 Dysphagia, oral phase: Secondary | ICD-10-CM | POA: Diagnosis not present

## 2021-04-12 DIAGNOSIS — E118 Type 2 diabetes mellitus with unspecified complications: Secondary | ICD-10-CM | POA: Diagnosis not present

## 2021-04-12 DIAGNOSIS — I2510 Atherosclerotic heart disease of native coronary artery without angina pectoris: Secondary | ICD-10-CM | POA: Diagnosis not present

## 2021-04-12 DIAGNOSIS — F05 Delirium due to known physiological condition: Secondary | ICD-10-CM | POA: Diagnosis not present

## 2021-04-12 DIAGNOSIS — F03911 Unspecified dementia, unspecified severity, with agitation: Secondary | ICD-10-CM | POA: Diagnosis not present

## 2021-04-12 DIAGNOSIS — I4821 Permanent atrial fibrillation: Secondary | ICD-10-CM | POA: Diagnosis not present

## 2021-04-12 DIAGNOSIS — F0390 Unspecified dementia without behavioral disturbance: Secondary | ICD-10-CM | POA: Diagnosis not present

## 2021-04-12 DIAGNOSIS — R296 Repeated falls: Secondary | ICD-10-CM | POA: Diagnosis not present

## 2021-04-12 DIAGNOSIS — I10 Essential (primary) hypertension: Secondary | ICD-10-CM | POA: Diagnosis not present

## 2021-04-13 DIAGNOSIS — I2510 Atherosclerotic heart disease of native coronary artery without angina pectoris: Secondary | ICD-10-CM | POA: Diagnosis not present

## 2021-04-13 DIAGNOSIS — R296 Repeated falls: Secondary | ICD-10-CM | POA: Diagnosis not present

## 2021-04-13 DIAGNOSIS — F0390 Unspecified dementia without behavioral disturbance: Secondary | ICD-10-CM | POA: Diagnosis not present

## 2021-04-13 DIAGNOSIS — R1311 Dysphagia, oral phase: Secondary | ICD-10-CM | POA: Diagnosis not present

## 2021-04-13 DIAGNOSIS — E118 Type 2 diabetes mellitus with unspecified complications: Secondary | ICD-10-CM | POA: Diagnosis not present

## 2021-04-13 DIAGNOSIS — F03911 Unspecified dementia, unspecified severity, with agitation: Secondary | ICD-10-CM | POA: Diagnosis not present

## 2021-04-13 DIAGNOSIS — I4821 Permanent atrial fibrillation: Secondary | ICD-10-CM | POA: Diagnosis not present

## 2021-04-13 DIAGNOSIS — F05 Delirium due to known physiological condition: Secondary | ICD-10-CM | POA: Diagnosis not present

## 2021-04-13 DIAGNOSIS — I10 Essential (primary) hypertension: Secondary | ICD-10-CM | POA: Diagnosis not present

## 2021-04-16 DIAGNOSIS — F03911 Unspecified dementia, unspecified severity, with agitation: Secondary | ICD-10-CM | POA: Diagnosis not present

## 2021-04-16 DIAGNOSIS — R296 Repeated falls: Secondary | ICD-10-CM | POA: Diagnosis not present

## 2021-04-16 DIAGNOSIS — E118 Type 2 diabetes mellitus with unspecified complications: Secondary | ICD-10-CM | POA: Diagnosis not present

## 2021-04-16 DIAGNOSIS — I2510 Atherosclerotic heart disease of native coronary artery without angina pectoris: Secondary | ICD-10-CM | POA: Diagnosis not present

## 2021-04-16 DIAGNOSIS — R1311 Dysphagia, oral phase: Secondary | ICD-10-CM | POA: Diagnosis not present

## 2021-04-16 DIAGNOSIS — F05 Delirium due to known physiological condition: Secondary | ICD-10-CM | POA: Diagnosis not present

## 2021-04-16 DIAGNOSIS — F0390 Unspecified dementia without behavioral disturbance: Secondary | ICD-10-CM | POA: Diagnosis not present

## 2021-04-16 DIAGNOSIS — I4821 Permanent atrial fibrillation: Secondary | ICD-10-CM | POA: Diagnosis not present

## 2021-04-16 DIAGNOSIS — I10 Essential (primary) hypertension: Secondary | ICD-10-CM | POA: Diagnosis not present

## 2021-04-17 DIAGNOSIS — F05 Delirium due to known physiological condition: Secondary | ICD-10-CM | POA: Diagnosis not present

## 2021-04-17 DIAGNOSIS — F0390 Unspecified dementia without behavioral disturbance: Secondary | ICD-10-CM | POA: Diagnosis not present

## 2021-04-17 DIAGNOSIS — I10 Essential (primary) hypertension: Secondary | ICD-10-CM | POA: Diagnosis not present

## 2021-04-17 DIAGNOSIS — R296 Repeated falls: Secondary | ICD-10-CM | POA: Diagnosis not present

## 2021-04-17 DIAGNOSIS — E118 Type 2 diabetes mellitus with unspecified complications: Secondary | ICD-10-CM | POA: Diagnosis not present

## 2021-04-17 DIAGNOSIS — I4821 Permanent atrial fibrillation: Secondary | ICD-10-CM | POA: Diagnosis not present

## 2021-04-17 DIAGNOSIS — I2510 Atherosclerotic heart disease of native coronary artery without angina pectoris: Secondary | ICD-10-CM | POA: Diagnosis not present

## 2021-04-17 DIAGNOSIS — R1311 Dysphagia, oral phase: Secondary | ICD-10-CM | POA: Diagnosis not present

## 2021-04-17 DIAGNOSIS — F03911 Unspecified dementia, unspecified severity, with agitation: Secondary | ICD-10-CM | POA: Diagnosis not present

## 2021-04-18 DIAGNOSIS — F03911 Unspecified dementia, unspecified severity, with agitation: Secondary | ICD-10-CM | POA: Diagnosis not present

## 2021-04-18 DIAGNOSIS — R1311 Dysphagia, oral phase: Secondary | ICD-10-CM | POA: Diagnosis not present

## 2021-04-18 DIAGNOSIS — E118 Type 2 diabetes mellitus with unspecified complications: Secondary | ICD-10-CM | POA: Diagnosis not present

## 2021-04-18 DIAGNOSIS — I4821 Permanent atrial fibrillation: Secondary | ICD-10-CM | POA: Diagnosis not present

## 2021-04-18 DIAGNOSIS — I10 Essential (primary) hypertension: Secondary | ICD-10-CM | POA: Diagnosis not present

## 2021-04-18 DIAGNOSIS — F0390 Unspecified dementia without behavioral disturbance: Secondary | ICD-10-CM | POA: Diagnosis not present

## 2021-04-18 DIAGNOSIS — F05 Delirium due to known physiological condition: Secondary | ICD-10-CM | POA: Diagnosis not present

## 2021-04-18 DIAGNOSIS — R296 Repeated falls: Secondary | ICD-10-CM | POA: Diagnosis not present

## 2021-04-18 DIAGNOSIS — I2510 Atherosclerotic heart disease of native coronary artery without angina pectoris: Secondary | ICD-10-CM | POA: Diagnosis not present

## 2021-04-19 DIAGNOSIS — I4821 Permanent atrial fibrillation: Secondary | ICD-10-CM | POA: Diagnosis not present

## 2021-04-19 DIAGNOSIS — R1311 Dysphagia, oral phase: Secondary | ICD-10-CM | POA: Diagnosis not present

## 2021-04-19 DIAGNOSIS — F05 Delirium due to known physiological condition: Secondary | ICD-10-CM | POA: Diagnosis not present

## 2021-04-19 DIAGNOSIS — I2510 Atherosclerotic heart disease of native coronary artery without angina pectoris: Secondary | ICD-10-CM | POA: Diagnosis not present

## 2021-04-19 DIAGNOSIS — F0390 Unspecified dementia without behavioral disturbance: Secondary | ICD-10-CM | POA: Diagnosis not present

## 2021-04-19 DIAGNOSIS — I10 Essential (primary) hypertension: Secondary | ICD-10-CM | POA: Diagnosis not present

## 2021-04-19 DIAGNOSIS — F03911 Unspecified dementia, unspecified severity, with agitation: Secondary | ICD-10-CM | POA: Diagnosis not present

## 2021-04-19 DIAGNOSIS — R296 Repeated falls: Secondary | ICD-10-CM | POA: Diagnosis not present

## 2021-04-19 DIAGNOSIS — E118 Type 2 diabetes mellitus with unspecified complications: Secondary | ICD-10-CM | POA: Diagnosis not present

## 2021-04-20 DIAGNOSIS — F03911 Unspecified dementia, unspecified severity, with agitation: Secondary | ICD-10-CM | POA: Diagnosis not present

## 2021-04-20 DIAGNOSIS — R1311 Dysphagia, oral phase: Secondary | ICD-10-CM | POA: Diagnosis not present

## 2021-04-20 DIAGNOSIS — I10 Essential (primary) hypertension: Secondary | ICD-10-CM | POA: Diagnosis not present

## 2021-04-20 DIAGNOSIS — I4821 Permanent atrial fibrillation: Secondary | ICD-10-CM | POA: Diagnosis not present

## 2021-04-20 DIAGNOSIS — F0390 Unspecified dementia without behavioral disturbance: Secondary | ICD-10-CM | POA: Diagnosis not present

## 2021-04-20 DIAGNOSIS — E118 Type 2 diabetes mellitus with unspecified complications: Secondary | ICD-10-CM | POA: Diagnosis not present

## 2021-04-20 DIAGNOSIS — I2510 Atherosclerotic heart disease of native coronary artery without angina pectoris: Secondary | ICD-10-CM | POA: Diagnosis not present

## 2021-04-20 DIAGNOSIS — F05 Delirium due to known physiological condition: Secondary | ICD-10-CM | POA: Diagnosis not present

## 2021-04-20 DIAGNOSIS — R296 Repeated falls: Secondary | ICD-10-CM | POA: Diagnosis not present

## 2021-04-23 DIAGNOSIS — R1311 Dysphagia, oral phase: Secondary | ICD-10-CM | POA: Diagnosis not present

## 2021-04-23 DIAGNOSIS — F0390 Unspecified dementia without behavioral disturbance: Secondary | ICD-10-CM | POA: Diagnosis not present

## 2021-04-23 DIAGNOSIS — I10 Essential (primary) hypertension: Secondary | ICD-10-CM | POA: Diagnosis not present

## 2021-04-23 DIAGNOSIS — F05 Delirium due to known physiological condition: Secondary | ICD-10-CM | POA: Diagnosis not present

## 2021-04-23 DIAGNOSIS — R296 Repeated falls: Secondary | ICD-10-CM | POA: Diagnosis not present

## 2021-04-23 DIAGNOSIS — F03911 Unspecified dementia, unspecified severity, with agitation: Secondary | ICD-10-CM | POA: Diagnosis not present

## 2021-04-23 DIAGNOSIS — E118 Type 2 diabetes mellitus with unspecified complications: Secondary | ICD-10-CM | POA: Diagnosis not present

## 2021-04-23 DIAGNOSIS — I2510 Atherosclerotic heart disease of native coronary artery without angina pectoris: Secondary | ICD-10-CM | POA: Diagnosis not present

## 2021-04-23 DIAGNOSIS — I4821 Permanent atrial fibrillation: Secondary | ICD-10-CM | POA: Diagnosis not present

## 2021-04-24 DIAGNOSIS — F0390 Unspecified dementia without behavioral disturbance: Secondary | ICD-10-CM | POA: Diagnosis not present

## 2021-04-24 DIAGNOSIS — F03911 Unspecified dementia, unspecified severity, with agitation: Secondary | ICD-10-CM | POA: Diagnosis not present

## 2021-04-24 DIAGNOSIS — R1311 Dysphagia, oral phase: Secondary | ICD-10-CM | POA: Diagnosis not present

## 2021-04-24 DIAGNOSIS — F05 Delirium due to known physiological condition: Secondary | ICD-10-CM | POA: Diagnosis not present

## 2021-04-24 DIAGNOSIS — I10 Essential (primary) hypertension: Secondary | ICD-10-CM | POA: Diagnosis not present

## 2021-04-24 DIAGNOSIS — I4821 Permanent atrial fibrillation: Secondary | ICD-10-CM | POA: Diagnosis not present

## 2021-04-24 DIAGNOSIS — E118 Type 2 diabetes mellitus with unspecified complications: Secondary | ICD-10-CM | POA: Diagnosis not present

## 2021-04-24 DIAGNOSIS — R296 Repeated falls: Secondary | ICD-10-CM | POA: Diagnosis not present

## 2021-04-24 DIAGNOSIS — I2510 Atherosclerotic heart disease of native coronary artery without angina pectoris: Secondary | ICD-10-CM | POA: Diagnosis not present

## 2021-04-25 DIAGNOSIS — I4821 Permanent atrial fibrillation: Secondary | ICD-10-CM | POA: Diagnosis not present

## 2021-04-25 DIAGNOSIS — E118 Type 2 diabetes mellitus with unspecified complications: Secondary | ICD-10-CM | POA: Diagnosis not present

## 2021-04-25 DIAGNOSIS — R296 Repeated falls: Secondary | ICD-10-CM | POA: Diagnosis not present

## 2021-04-25 DIAGNOSIS — I10 Essential (primary) hypertension: Secondary | ICD-10-CM | POA: Diagnosis not present

## 2021-04-25 DIAGNOSIS — I2510 Atherosclerotic heart disease of native coronary artery without angina pectoris: Secondary | ICD-10-CM | POA: Diagnosis not present

## 2021-04-25 DIAGNOSIS — F03911 Unspecified dementia, unspecified severity, with agitation: Secondary | ICD-10-CM | POA: Diagnosis not present

## 2021-04-25 DIAGNOSIS — R1311 Dysphagia, oral phase: Secondary | ICD-10-CM | POA: Diagnosis not present

## 2021-04-25 DIAGNOSIS — F0390 Unspecified dementia without behavioral disturbance: Secondary | ICD-10-CM | POA: Diagnosis not present

## 2021-04-25 DIAGNOSIS — F05 Delirium due to known physiological condition: Secondary | ICD-10-CM | POA: Diagnosis not present

## 2021-06-13 DEATH — deceased

## 2021-07-03 ENCOUNTER — Telehealth: Payer: Medicare HMO

## 2021-07-03 ENCOUNTER — Encounter: Payer: Medicare HMO | Admitting: Internal Medicine
